# Patient Record
Sex: Male | Born: 1960 | Race: White | Hispanic: No | Marital: Married | Smoking: Never smoker
Health system: Southern US, Community
[De-identification: ages and names within clinical notes are randomized; demographics above are authoritative.]

## PROBLEM LIST (undated history)

## (undated) DIAGNOSIS — F1120 Opioid dependence, uncomplicated: Secondary | ICD-10-CM

## (undated) DIAGNOSIS — E119 Type 2 diabetes mellitus without complications: Secondary | ICD-10-CM

## (undated) DIAGNOSIS — F331 Major depressive disorder, recurrent, moderate: Secondary | ICD-10-CM

## (undated) DIAGNOSIS — J189 Pneumonia, unspecified organism: Secondary | ICD-10-CM

## (undated) DIAGNOSIS — I10 Essential (primary) hypertension: Secondary | ICD-10-CM

## (undated) DIAGNOSIS — M51369 Other intervertebral disc degeneration, lumbar region without mention of lumbar back pain or lower extremity pain: Secondary | ICD-10-CM

## (undated) DIAGNOSIS — F419 Anxiety disorder, unspecified: Secondary | ICD-10-CM

## (undated) DIAGNOSIS — E782 Mixed hyperlipidemia: Secondary | ICD-10-CM

## (undated) DIAGNOSIS — L409 Psoriasis, unspecified: Secondary | ICD-10-CM

## (undated) DIAGNOSIS — N401 Enlarged prostate with lower urinary tract symptoms: Secondary | ICD-10-CM

## (undated) DIAGNOSIS — J4520 Mild intermittent asthma, uncomplicated: Secondary | ICD-10-CM

## (undated) DIAGNOSIS — I25118 Atherosclerotic heart disease of native coronary artery with other forms of angina pectoris: Secondary | ICD-10-CM

## (undated) DIAGNOSIS — G629 Polyneuropathy, unspecified: Secondary | ICD-10-CM

## (undated) DIAGNOSIS — K219 Gastro-esophageal reflux disease without esophagitis: Secondary | ICD-10-CM

## (undated) DIAGNOSIS — R0600 Dyspnea, unspecified: Secondary | ICD-10-CM

## (undated) DIAGNOSIS — F329 Major depressive disorder, single episode, unspecified: Secondary | ICD-10-CM

## (undated) DIAGNOSIS — Z8489 Family history of other specified conditions: Secondary | ICD-10-CM

## (undated) DIAGNOSIS — M5136 Other intervertebral disc degeneration, lumbar region: Secondary | ICD-10-CM

## (undated) DIAGNOSIS — E66812 Obesity, class 2: Secondary | ICD-10-CM

## (undated) DIAGNOSIS — E785 Hyperlipidemia, unspecified: Secondary | ICD-10-CM

## (undated) DIAGNOSIS — I219 Acute myocardial infarction, unspecified: Secondary | ICD-10-CM

## (undated) DIAGNOSIS — Z96651 Presence of right artificial knee joint: Secondary | ICD-10-CM

## (undated) DIAGNOSIS — D649 Anemia, unspecified: Secondary | ICD-10-CM

## (undated) DIAGNOSIS — F32A Depression, unspecified: Secondary | ICD-10-CM

## (undated) DIAGNOSIS — M1990 Unspecified osteoarthritis, unspecified site: Secondary | ICD-10-CM

## (undated) DIAGNOSIS — I2510 Atherosclerotic heart disease of native coronary artery without angina pectoris: Secondary | ICD-10-CM

## (undated) HISTORY — PX: SHOULDER ARTHROSCOPY WITH OPEN ROTATOR CUFF REPAIR: SHX6092

## (undated) HISTORY — DX: Morbid (severe) obesity due to excess calories: E66.01

## (undated) HISTORY — DX: Anxiety disorder, unspecified: F41.9

## (undated) HISTORY — PX: CORONARY ANGIOPLASTY: SHX604

## (undated) HISTORY — DX: Benign prostatic hyperplasia with lower urinary tract symptoms: N40.1

## (undated) HISTORY — DX: Mixed hyperlipidemia: E78.2

## (undated) HISTORY — PX: CHOLECYSTECTOMY: SHX55

## (undated) HISTORY — PX: CARDIAC CATHETERIZATION: SHX172

## (undated) HISTORY — DX: Obesity, class 2: E66.812

## (undated) HISTORY — DX: Major depressive disorder, recurrent, moderate: F33.1

## (undated) HISTORY — DX: Opioid dependence, uncomplicated: F11.20

## (undated) HISTORY — PX: ESOPHAGEAL DILATION: SHX303

## (undated) HISTORY — DX: Atherosclerotic heart disease of native coronary artery with other forms of angina pectoris: I25.118

## (undated) HISTORY — PX: TUMOR REMOVAL: SHX12

## (undated) HISTORY — DX: Presence of right artificial knee joint: Z96.651

## (undated) HISTORY — DX: Anemia, unspecified: D64.9

## (undated) HISTORY — PX: KNEE ARTHROSCOPY: SUR90

## (undated) HISTORY — DX: Other intervertebral disc degeneration, lumbar region without mention of lumbar back pain or lower extremity pain: M51.369

## (undated) HISTORY — DX: Other intervertebral disc degeneration, lumbar region: M51.36

## (undated) HISTORY — DX: Mild intermittent asthma, uncomplicated: J45.20

---

## 2008-07-08 ENCOUNTER — Ambulatory Visit (HOSPITAL_BASED_OUTPATIENT_CLINIC_OR_DEPARTMENT_OTHER): Admission: RE | Admit: 2008-07-08 | Discharge: 2008-07-08 | Payer: Self-pay | Admitting: Orthopedic Surgery

## 2008-07-08 ENCOUNTER — Encounter (INDEPENDENT_AMBULATORY_CARE_PROVIDER_SITE_OTHER): Payer: Self-pay | Admitting: Orthopedic Surgery

## 2009-01-05 ENCOUNTER — Encounter: Payer: Self-pay | Admitting: Cardiovascular Disease

## 2009-01-23 ENCOUNTER — Encounter: Payer: Self-pay | Admitting: Cardiovascular Disease

## 2009-01-26 ENCOUNTER — Ambulatory Visit: Payer: Self-pay | Admitting: Cardiovascular Disease

## 2009-01-26 DIAGNOSIS — F341 Dysthymic disorder: Secondary | ICD-10-CM

## 2009-01-26 DIAGNOSIS — I739 Peripheral vascular disease, unspecified: Secondary | ICD-10-CM

## 2009-01-26 DIAGNOSIS — R0602 Shortness of breath: Secondary | ICD-10-CM

## 2009-01-26 DIAGNOSIS — R079 Chest pain, unspecified: Secondary | ICD-10-CM

## 2009-01-26 DIAGNOSIS — I70219 Atherosclerosis of native arteries of extremities with intermittent claudication, unspecified extremity: Secondary | ICD-10-CM

## 2009-01-26 DIAGNOSIS — E860 Dehydration: Secondary | ICD-10-CM

## 2009-01-26 HISTORY — DX: Dysthymic disorder: F34.1

## 2009-01-26 HISTORY — DX: Peripheral vascular disease, unspecified: I73.9

## 2009-01-26 HISTORY — DX: Dehydration: E86.0

## 2009-01-26 HISTORY — DX: Atherosclerosis of native arteries of extremities with intermittent claudication, unspecified extremity: I70.219

## 2009-01-26 HISTORY — DX: Chest pain, unspecified: R07.9

## 2009-01-26 HISTORY — DX: Shortness of breath: R06.02

## 2009-02-17 ENCOUNTER — Ambulatory Visit: Payer: Self-pay

## 2009-02-17 ENCOUNTER — Encounter: Payer: Self-pay | Admitting: Cardiovascular Disease

## 2010-12-11 NOTE — Op Note (Signed)
NAMEGUNNER, Kyle [name]                 ACCOUNT NO.:  [id number]   MEDICAL RECORD NO.:  [id number]          PATIENT TYPE:  AMB   LOCATION:  DSC                          FACILITY:  [facility]   PHYSICIAN:  Marlowe Kays, M.D.  DATE OF BIRTH:  08-11-1960   DATE OF PROCEDURE:  07/08/2008  DATE OF DISCHARGE:                               OPERATIVE REPORT   PREOPERATIVE DIAGNOSES:  1. Mass right shoulder suspected lipoma.  2. Impingement syndrome with rotator cuff tendinopathy and AC      (acromioclavicular) joint arthritis left shoulder.   POSTOPERATIVE DIAGNOSES:  1. Mass right shoulder suspected lipoma.  2. Impingement syndrome with rotator cuff tendinopathy and AC joint      arthritis left shoulder.   OPERATIONS:  1. Excision of mass right shoulder (suspected lipoma).  2. Left shoulder arthroscopy with a) debridement of articular surface      of rotator cuff and labrum, b) arthroscopic subacromial      decompression, c) arthroscopic decompression of the distal inferior      clavicle.   SURGEON:  Marlowe Kays, M.D.   ASSISTANTDruscilla Brownie. Cherlynn June.   ANESTHESIA:  General.   INDICATIONS FOR PROCEDURE:  He has had a nongrowing mass which however  has concerned him over a long period of time in the deltoid area of his  right shoulder.  I told him I thought this was most benign and a lipoma  but he desired to have this excised at the time we did his left shoulder  surgery.  He has had a painful left shoulder with the MRI demonstrating  the preoperative diagnoses.   PROCEDURE IN DETAIL:  Satisfactory general anesthesia, first in a very  limited beach-chair position, the right shoulder girdle was prepped with  DuraPrep, draped in a sterile field.  Time-out performed.  I made a 5 cm  vertical incision and found the lipoma right below the subcutaneous  tissue.  I shelled this out and it will be sent to pathology.  Bleeders  were coagulated.  The wound was then closed with  interrupted 2-0 Vicryl  deep and interrupted 4-0 nylon mattress sutures in the skin.  Betadine,  Adaptic dry sterile dressing were applied.  We then prepped and draped  the left shoulder after placing him in the beach-chair position, draped  the shoulder in a sterile field.  The anatomy of the shoulder joint was  marked out, time-out performed.  He had had a preoperative interscalene  block but I still used half percent Marcaine with adrenaline for  hemostatic purposes infiltrating the subacromial space and lateral and  posterior portals initially.  Through a posterior soft spot portal I  atraumatically entered the glenohumeral joint.  He had a normal biceps  tendon.  There was some wear of the articular surface of the rotator  cuff and some moderate degeneration of the labrum at the biceps anchor.  The remainder of the shoulder joint looked unremarkable.  I advanced the  scope between the biceps tendon and subscapularis and using a switching  stick made an anterior incision over  the switching stick.  I then placed  a metal cannula in the joint followed by a 4.2 shaver and began debrided  down the labrum and the undersurface of the rotator cuff with pictures  being taken.  I then removed all fluid possible from the glenohumeral  joint and redirected the scope into the subacromial space through a  lateral portal, introduced a blunt cannula followed by a 4.2 shaver.  He  had a mild amount of bursal type tissue.  After removing this I also  documented the severe impingement he had.  I then brought in the 90  degree ArthroCare vaporizer and began removing soft tissue from the  undersurface of the acromion and the distal clavicle.  I followed this  with a 4 mm oval bur and alternated back and forth between the bur and  the vaporizer until we had a wide decompression of both the subacromial  space and the distal clavicle.  This was all documented with pictures  with the vaporizer in place and  his arm to his side and arm elevated.  I  then cleared the subacromial space of all fluid possible and I injected  the three portal sites as well as the subacromial space with half  percent Marcaine with adrenaline.  I then closed the three portals with  interrupted 4-0 nylon mattress sutures.  Betadine, Adaptic, dry sterile  dressing and shoulder immobilizer applied.  He tolerated the procedure  well and was taken to recovery in satisfactory condition with no known  complications.  He basically had no blood loss.           ______________________________  Marlowe Kays, M.D.     JA/MEDQ  D:  07/08/2008  T:  07/09/2008  Job:  [id number]

## 2011-05-03 LAB — BASIC METABOLIC PANEL
CO2: 28 mEq/L (ref 19–32)
Calcium: 9.3 mg/dL (ref 8.4–10.5)
Glucose, Bld: 113 mg/dL — ABNORMAL HIGH (ref 70–99)
Sodium: 139 mEq/L (ref 135–145)

## 2013-11-01 ENCOUNTER — Ambulatory Visit (INDEPENDENT_AMBULATORY_CARE_PROVIDER_SITE_OTHER): Payer: Managed Care, Other (non HMO) | Admitting: Podiatry

## 2013-11-01 ENCOUNTER — Encounter: Payer: Self-pay | Admitting: Podiatry

## 2013-11-01 VITALS — BP 120/79 | HR 103 | Ht 68.0 in | Wt 264.0 lb

## 2013-11-01 DIAGNOSIS — E119 Type 2 diabetes mellitus without complications: Secondary | ICD-10-CM

## 2013-11-01 DIAGNOSIS — M21969 Unspecified acquired deformity of unspecified lower leg: Secondary | ICD-10-CM

## 2013-11-01 DIAGNOSIS — M722 Plantar fascial fibromatosis: Secondary | ICD-10-CM

## 2013-11-01 DIAGNOSIS — Z794 Long term (current) use of insulin: Secondary | ICD-10-CM

## 2013-11-01 HISTORY — DX: Type 2 diabetes mellitus without complications: Z79.4

## 2013-11-01 HISTORY — DX: Plantar fascial fibromatosis: M72.2

## 2013-11-01 HISTORY — DX: Type 2 diabetes mellitus without complications: E11.9

## 2013-11-01 HISTORY — DX: Unspecified acquired deformity of unspecified lower leg: M21.969

## 2013-11-01 MED ORDER — OXYCODONE-ACETAMINOPHEN 7.5-325 MG PO TABS: 1.0000 | ORAL_TABLET | Freq: Four times a day (QID) | ORAL | Status: DC | PRN

## 2013-11-01 MED ORDER — NABUMETONE 500 MG PO TABS: 500.0000 mg | ORAL_TABLET | Freq: Two times a day (BID) | ORAL | Status: DC

## 2013-11-01 NOTE — Patient Instructions (Signed)
Seen for left heel pain. May take Relafen twice a day till pain stops. Ok to take pain pill as needed. Will prepare Orthotics on next visit.

## 2013-11-01 NOTE — Progress Notes (Signed)
Subjective: 53 year old male presents complaining of pain in left heel and arch x 3 weeks. On feet not much. Has not been able to work for several years due to bad back and bad knee since 2009.  Blood sugar stays normal, averaging 90-120.    Review of Systems - General ROS: negative for - chills, fever, night sweats, sleep disturbance, weight gain or weight loss Ophthalmic ROS: negative ENT ROS: negative Allergy and Immunology ROS: Seasonal. Respiratory ROS: no cough, shortness of breath, or wheezing Cardiovascular ROS: no chest pain or dyspnea on exertion Gastrointestinal ROS: no abdominal pain, change in bowel habits, or black or bloody stools Genito-Urinary ROS: no dysuria, trouble voiding, or hematuria Musculoskeletal ROS: Back and knee pain x 10 years. Neurological ROS: no TIA or stroke symptoms Dermatological ROS: Psoriasis on knees and elbows.  Objective: Dermatologic: Dry and cracking heel. Vascular: All pedal pulses are palpable.  Neurologic: All epicritic and tactile sensations grossly intact.  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 Pain with direct pressure to left heel and arch.  Radiographic examination reveal high arch cavus foot with elevated and long first metatarsal bone, increased lateral deviation of the Calcaneocuboid angle bilateral. Positive of plantar calcaneal spur bilateral.   Assessment: Plantar fasciitis left. Hypermobile first ray left. NIDDM under control.  Plan: Reviewed clinical findings and available treatment options such as injection, NSAIA, orthotics and change in shoe gear and activity.  Recommended Orthotic shoe inserts. Patient will return for orthotics.

## 2013-11-17 ENCOUNTER — Encounter: Payer: Self-pay | Admitting: Podiatry

## 2013-11-17 ENCOUNTER — Ambulatory Visit (INDEPENDENT_AMBULATORY_CARE_PROVIDER_SITE_OTHER): Payer: Managed Care, Other (non HMO) | Admitting: Podiatry

## 2013-11-17 VITALS — BP 147/98 | HR 93

## 2013-11-17 DIAGNOSIS — M722 Plantar fascial fibromatosis: Secondary | ICD-10-CM

## 2013-11-17 DIAGNOSIS — M21969 Unspecified acquired deformity of unspecified lower leg: Secondary | ICD-10-CM

## 2013-11-17 MED ORDER — OXYCODONE-ACETAMINOPHEN 7.5-325 MG PO TABS: 1.0000 | ORAL_TABLET | Freq: Four times a day (QID) | ORAL | Status: DC | PRN

## 2013-11-17 NOTE — Progress Notes (Signed)
Subjective: Has bought OTC orthotics and unable to tolerate in shoe.  Patient adamantly Stated that he cannot take Cortisone shot because it would elevate his blood sugar.  Patient is taking Relafen and pain pills. The medication is not helping with his foot pain.  Custom orthotics are not affordable for him at this time.  Patient wish to try surgical rout.   Objective: Previous X-ray indicate spurring at plantar calcaneal tuberosity, elevated first ray in Cavus type foot.  Ankle range of motion is within normal. Positive of elevated first ray upon loading of the forefoot. Neurovascular status are within normal.  Assessment: Plantar fasciitis left foot with calcaneal spur.  Plan: Reviewed all available treatment options such as injection, NSAIA, pain medications, Night Splints, Custom orthotics, and exercise etc.  Patient requested for surgical intervention. Surgery consent form reviewed for Plantar fascial release with spur resection.

## 2013-11-17 NOTE — Patient Instructions (Signed)
Seen for left heel pain. All treatment options reviewed.  May also try Night Splint, NSAIA and pain medication.  Surgery consent form reviewed for heel spur resection with plantar fascial release left foot.

## 2013-11-25 DIAGNOSIS — M7730 Calcaneal spur, unspecified foot: Secondary | ICD-10-CM

## 2013-11-25 HISTORY — PX: OTHER SURGICAL HISTORY: SHX169

## 2013-11-26 ENCOUNTER — Telehealth: Payer: Self-pay | Admitting: *Deleted

## 2013-11-26 NOTE — Telephone Encounter (Signed)
DR.Sheard, Patient called this am and asked can he put any weight on his left foot. He had surgery yesterday.

## 2013-11-30 ENCOUNTER — Ambulatory Visit (INDEPENDENT_AMBULATORY_CARE_PROVIDER_SITE_OTHER): Payer: Managed Care, Other (non HMO) | Admitting: Podiatry

## 2013-11-30 ENCOUNTER — Encounter: Payer: Managed Care, Other (non HMO) | Admitting: Podiatry

## 2013-11-30 ENCOUNTER — Encounter: Payer: Self-pay | Admitting: Podiatry

## 2013-11-30 VITALS — BP 131/81 | HR 85

## 2013-11-30 DIAGNOSIS — M722 Plantar fascial fibromatosis: Secondary | ICD-10-CM

## 2013-11-30 NOTE — Patient Instructions (Signed)
Seen for post op follow up care. Doing well walking in splint. Will stay in splint another week.

## 2013-11-30 NOTE — Progress Notes (Signed)
5 day post op following heel spur resection with plantar fasciotomy left foot. Patient is walking with posterior splint in left foot. Patient stated that the foot feels better than before the surgery.  Assessment: Satisfactory progress.  Plan: Keep in posterior splint for another week. Next week change to Cam walker.

## 2013-12-07 ENCOUNTER — Ambulatory Visit (INDEPENDENT_AMBULATORY_CARE_PROVIDER_SITE_OTHER): Payer: Managed Care, Other (non HMO) | Admitting: Podiatry

## 2013-12-07 ENCOUNTER — Encounter: Payer: Self-pay | Admitting: Podiatry

## 2013-12-07 VITALS — BP 140/82 | HR 102

## 2013-12-07 DIAGNOSIS — M79606 Pain in leg, unspecified: Secondary | ICD-10-CM

## 2013-12-07 DIAGNOSIS — M79609 Pain in unspecified limb: Secondary | ICD-10-CM

## 2013-12-07 DIAGNOSIS — M722 Plantar fascial fibromatosis: Secondary | ICD-10-CM

## 2013-12-07 HISTORY — DX: Pain in leg, unspecified: M79.606

## 2013-12-07 NOTE — Progress Notes (Signed)
12 days following heel spur resection and plantar fasciotomy. Doing well with posterior splint on left. Splint removed.  Suture removed. No edema or abnormal discoloration noted. Right lower limb placed in CAM walker.  Return in 1 month.

## 2013-12-07 NOTE — Patient Instructions (Signed)
12 day post op check left foot. Doing well. Dispensed CAM walker. Return in 2 weeks.

## 2013-12-21 ENCOUNTER — Encounter: Payer: Self-pay | Admitting: Podiatry

## 2013-12-21 ENCOUNTER — Ambulatory Visit (INDEPENDENT_AMBULATORY_CARE_PROVIDER_SITE_OTHER): Payer: Managed Care, Other (non HMO) | Admitting: Podiatry

## 2013-12-21 VITALS — BP 123/76 | HR 81

## 2013-12-21 DIAGNOSIS — M722 Plantar fascial fibromatosis: Secondary | ICD-10-CM

## 2013-12-21 NOTE — Progress Notes (Signed)
Three and a half week since Heel spur resection.  Stated that the foot feels a lot better than the first he came in.  Surgical wound well healed.  Ready to return to regular tennis shoes. Will prepare for Orthotics on next visit.  Return as needed.

## 2013-12-21 NOTE — Patient Instructions (Signed)
Three and a half week following Heel spur resection.  Doing well with ambulation in CAM walker.  OK to return to regular shoes. Return to prepare for Orthotics.

## 2015-05-29 ENCOUNTER — Other Ambulatory Visit: Payer: Self-pay | Admitting: Physician Assistant

## 2015-05-29 DIAGNOSIS — M545 Low back pain: Secondary | ICD-10-CM

## 2015-06-04 ENCOUNTER — Inpatient Hospital Stay
Admission: RE | Admit: 2015-06-04 | Discharge: 2015-06-04 | Disposition: A | Discharge status: Home / Self Care | Payer: Self-pay | Source: Ambulatory Visit | Attending: Physician Assistant | Admitting: Physician Assistant

## 2015-06-10 ENCOUNTER — Ambulatory Visit
Admission: RE | Admit: 2015-06-10 | Discharge: 2015-06-10 | Disposition: A | Discharge status: Home / Self Care | Payer: Managed Care, Other (non HMO) | Source: Ambulatory Visit | Attending: Physician Assistant | Admitting: Physician Assistant

## 2015-06-10 DIAGNOSIS — M545 Low back pain: Secondary | ICD-10-CM

## 2015-08-23 DIAGNOSIS — M5137 Other intervertebral disc degeneration, lumbosacral region: Secondary | ICD-10-CM | POA: Insufficient documentation

## 2015-08-23 DIAGNOSIS — I10 Essential (primary) hypertension: Secondary | ICD-10-CM

## 2015-08-23 DIAGNOSIS — M51379 Other intervertebral disc degeneration, lumbosacral region without mention of lumbar back pain or lower extremity pain: Secondary | ICD-10-CM

## 2015-08-23 DIAGNOSIS — R1084 Generalized abdominal pain: Secondary | ICD-10-CM

## 2015-08-23 DIAGNOSIS — E119 Type 2 diabetes mellitus without complications: Secondary | ICD-10-CM

## 2015-08-23 HISTORY — DX: Type 2 diabetes mellitus without complications: E11.9

## 2015-08-23 HISTORY — DX: Morbid (severe) obesity due to excess calories: E66.01

## 2015-08-23 HISTORY — DX: Other intervertebral disc degeneration, lumbosacral region without mention of lumbar back pain or lower extremity pain: M51.379

## 2015-08-23 HISTORY — DX: Generalized abdominal pain: R10.84

## 2015-08-23 HISTORY — DX: Essential (primary) hypertension: I10

## 2015-08-23 HISTORY — DX: Other intervertebral disc degeneration, lumbosacral region: M51.37

## 2015-09-08 ENCOUNTER — Other Ambulatory Visit: Payer: Self-pay | Admitting: Orthopedic Surgery

## 2015-09-21 NOTE — Pre-Procedure Instructions (Signed)
Kyle [name]  09/21/2015     Your procedure is scheduled on : Wednesday October 04, 2015 at 12:45 PM.  Report to [facility] Admitting at 10:45 AM.  Call this number if you have problems the morning of surgery: [phone]    Remember:  Do not eat food or drink liquids after midnight.  Take these medicines the morning of surgery with A SIP OF WATER : Amlodipine (Norvasc), Hydrocodone if needed, Metoprolol (Toprol XL), Omeprazole (Prilosec), Paroxetine (Paxil)   Stop taking any vitamins, herbal medications/supplements, Ibuprofen, Advil, Motrin, Aleve, etc on Wednesday March 1st   Only take 11 units of Lantus insulin the night before your surgery   Do NOT take any diabetic pills the morning of your surgery (NO Glipizide/Glucotrol, NO Metformin/Glucophage)  How to Manage Your Diabetes Before Surgery   Why is it important to control my blood sugar before and after surgery?   Improving blood sugar levels before and after surgery helps healing and can limit problems.  A way of improving blood sugar control is eating a healthy diet by:  - Eating less sugar and carbohydrates  - Increasing activity/exercise  - Talk with your doctor about reaching your blood sugar goals  High blood sugars (greater than 180 mg/dL) can raise your risk of infections and slow down your recovery so you will need to focus on controlling your diabetes during the weeks before surgery.  Make sure that the doctor who takes care of your diabetes knows about your planned surgery including the date and location.  How do I manage my blood sugars before surgery?   Check your blood sugar at least 4 times a day, 2 days before surgery to make sure that they are not too high or low.   Check your blood sugar the morning of your surgery when you wake up and every 2 hours until you get to the Short-Stay unit.  If your blood sugar is less than 70 mg/dL, you will need to treat for low blood sugar by:  Treat a low  blood sugar (less than 70 mg/dL) with 1/2 cup of clear juice (cranberry or apple), 4 glucose tablets, OR glucose gel.  Recheck blood sugar in 15 minutes after treatment (to make sure it is greater than 70 mg/dL).  If blood sugar is not greater than 70 mg/dL on re-check, call [phone] for further instructions.   Report your blood sugar to the Short-Stay nurse when you get to Short-Stay.  References:  [facility], 2007 "How to Manage your Diabetes Before and After Surgery".  What do I do about my diabetes medications?   Do not take oral diabetes medicines (pills) the morning of surgery.   Do not take other diabetes injectables the day of surgery including Byetta, Victoza, Bydureon, and Trulicity.   Do not wear jewelry.  Do not wear lotions, powders, or cologne.    Men may shave face and neck.  Do not bring valuables to the hospital.  [facility] is not responsible for any belongings or valuables.  Contacts, dentures or bridgework may not be worn into surgery.  Leave your suitcase in the car.  After surgery it may be brought to your room.  For patients admitted to the hospital, discharge time will be determined by your treatment team.  Patients discharged the day of surgery will not be allowed to drive home.   Name and phone number of your driver:    Special instructions:  Shower using CHG  soap the night before and the morning of your surgery  Please read over the following fact sheets that you were given. Pain Booklet, Coughing and Deep Breathing, Blood Transfusion Information, Total Joint Packet, MRSA Information and Surgical Site Infection Prevention

## 2015-09-22 ENCOUNTER — Encounter (HOSPITAL_COMMUNITY): Payer: Self-pay

## 2015-09-22 ENCOUNTER — Encounter (HOSPITAL_COMMUNITY)
Admission: RE | Admit: 2015-09-22 | Discharge: 2015-09-22 | Disposition: A | Discharge status: Home / Self Care | Payer: Managed Care, Other (non HMO) | Source: Ambulatory Visit | Attending: Orthopedic Surgery | Admitting: Orthopedic Surgery

## 2015-09-22 ENCOUNTER — Ambulatory Visit (HOSPITAL_COMMUNITY)
Admission: RE | Admit: 2015-09-22 | Discharge: 2015-09-22 | Disposition: A | Discharge status: Home / Self Care | Payer: Managed Care, Other (non HMO) | Source: Ambulatory Visit | Attending: Orthopedic Surgery | Admitting: Orthopedic Surgery

## 2015-09-22 DIAGNOSIS — Z794 Long term (current) use of insulin: Secondary | ICD-10-CM | POA: Diagnosis not present

## 2015-09-22 DIAGNOSIS — Z01818 Encounter for other preprocedural examination: Secondary | ICD-10-CM | POA: Insufficient documentation

## 2015-09-22 DIAGNOSIS — E785 Hyperlipidemia, unspecified: Secondary | ICD-10-CM | POA: Diagnosis not present

## 2015-09-22 DIAGNOSIS — L409 Psoriasis, unspecified: Secondary | ICD-10-CM | POA: Insufficient documentation

## 2015-09-22 DIAGNOSIS — Z0183 Encounter for blood typing: Secondary | ICD-10-CM | POA: Insufficient documentation

## 2015-09-22 DIAGNOSIS — K219 Gastro-esophageal reflux disease without esophagitis: Secondary | ICD-10-CM | POA: Insufficient documentation

## 2015-09-22 DIAGNOSIS — I2510 Atherosclerotic heart disease of native coronary artery without angina pectoris: Secondary | ICD-10-CM | POA: Diagnosis not present

## 2015-09-22 DIAGNOSIS — F329 Major depressive disorder, single episode, unspecified: Secondary | ICD-10-CM | POA: Insufficient documentation

## 2015-09-22 DIAGNOSIS — I10 Essential (primary) hypertension: Secondary | ICD-10-CM | POA: Diagnosis not present

## 2015-09-22 DIAGNOSIS — Z7982 Long term (current) use of aspirin: Secondary | ICD-10-CM | POA: Diagnosis not present

## 2015-09-22 DIAGNOSIS — Z79899 Other long term (current) drug therapy: Secondary | ICD-10-CM | POA: Insufficient documentation

## 2015-09-22 DIAGNOSIS — Z01812 Encounter for preprocedural laboratory examination: Secondary | ICD-10-CM | POA: Insufficient documentation

## 2015-09-22 DIAGNOSIS — M1711 Unilateral primary osteoarthritis, right knee: Secondary | ICD-10-CM | POA: Insufficient documentation

## 2015-09-22 DIAGNOSIS — E119 Type 2 diabetes mellitus without complications: Secondary | ICD-10-CM | POA: Diagnosis not present

## 2015-09-22 HISTORY — DX: Essential (primary) hypertension: I10

## 2015-09-22 HISTORY — DX: Atherosclerotic heart disease of native coronary artery without angina pectoris: I25.10

## 2015-09-22 HISTORY — DX: Hyperlipidemia, unspecified: E78.5

## 2015-09-22 HISTORY — DX: Major depressive disorder, single episode, unspecified: F32.9

## 2015-09-22 HISTORY — DX: Type 2 diabetes mellitus without complications: E11.9

## 2015-09-22 HISTORY — DX: Depression, unspecified: F32.A

## 2015-09-22 HISTORY — DX: Unspecified osteoarthritis, unspecified site: M19.90

## 2015-09-22 HISTORY — DX: Family history of other specified conditions: Z84.89

## 2015-09-22 HISTORY — DX: Psoriasis, unspecified: L40.9

## 2015-09-22 HISTORY — DX: Gastro-esophageal reflux disease without esophagitis: K21.9

## 2015-09-22 LAB — BASIC METABOLIC PANEL
Anion gap: 12 (ref 5–15)
BUN: 10 mg/dL (ref 6–20)
CHLORIDE: 103 mmol/L (ref 101–111)
CO2: 25 mmol/L (ref 22–32)
CREATININE: 0.96 mg/dL (ref 0.61–1.24)
Calcium: 9.5 mg/dL (ref 8.9–10.3)
GFR calc Af Amer: >60 mL/min (ref >60)
GFR calc non Af Amer: >60 mL/min (ref >60)
Glucose, Bld: 147 mg/dL — ABNORMAL HIGH (ref 65–99)
Potassium: 4.6 mmol/L (ref 3.5–5.1)
SODIUM: 140 mmol/L (ref 135–145)

## 2015-09-22 LAB — CBC WITH DIFFERENTIAL/PLATELET
Basophils Absolute: 0 10*3/uL (ref 0.0–0.1)
Basophils Relative: 1 %
EOS ABS: 0.4 10*3/uL (ref 0.0–0.7)
EOS PCT: 6 %
HCT: 40.8 % (ref 39.0–52.0)
Hemoglobin: 13.4 g/dL (ref 13.0–17.0)
LYMPHS ABS: 1.8 10*3/uL (ref 0.7–4.0)
Lymphocytes Relative: 27 %
MCH: 30.2 pg (ref 26.0–34.0)
MCHC: 32.8 g/dL (ref 30.0–36.0)
MCV: 91.9 fL (ref 78.0–100.0)
MONOS PCT: 12 %
Monocytes Absolute: 0.8 10*3/uL (ref 0.1–1.0)
Neutro Abs: 3.6 10*3/uL (ref 1.7–7.7)
Neutrophils Relative %: 54 %
PLATELETS: 264 10*3/uL (ref 150–400)
RBC: 4.44 MIL/uL (ref 4.22–5.81)
RDW: 13.4 % (ref 11.5–15.5)
WBC: 6.7 10*3/uL (ref 4.0–10.5)

## 2015-09-22 LAB — URINALYSIS, ROUTINE W REFLEX MICROSCOPIC
Bilirubin Urine: NEGATIVE
Glucose, UA: 100 mg/dL — AB
Hgb urine dipstick: NEGATIVE
KETONES UR: NEGATIVE mg/dL
LEUKOCYTES UA: NEGATIVE
NITRITE: NEGATIVE
PH: 5 (ref 5.0–8.0)
Protein, ur: NEGATIVE mg/dL
SPECIFIC GRAVITY, URINE: 1.015 (ref 1.005–1.030)

## 2015-09-22 LAB — SURGICAL PCR SCREEN
MRSA, PCR: POSITIVE — AB
STAPHYLOCOCCUS AUREUS: POSITIVE — AB

## 2015-09-22 LAB — PROTIME-INR
INR: 1.1 (ref 0.00–1.49)
PROTHROMBIN TIME: 14.4 s (ref 11.6–15.2)

## 2015-09-22 LAB — APTT: aPTT: 32 seconds (ref 24–37)

## 2015-09-22 LAB — TYPE AND SCREEN
ABO/RH(D): O POS
Antibody Screen: NEGATIVE

## 2015-09-22 LAB — ABO/RH: ABO/RH(D): O POS

## 2015-09-22 LAB — GLUCOSE, CAPILLARY: GLUCOSE-CAPILLARY: 159 mg/dL — AB (ref 65–99)

## 2015-09-22 NOTE — Progress Notes (Signed)
   09/22/15 1058  OBSTRUCTIVE SLEEP APNEA  Have you ever been diagnosed with sleep apnea through a sleep study? No  Do you snore loudly (loud enough to be heard through closed doors)?  1  Do you often feel tired, fatigued, or sleepy during the daytime (such as falling asleep during driving or talking to someone)? 0  Has anyone observed you stop breathing during your sleep? 0  Do you have, or are you being treated for high blood pressure? 1  BMI more than 35 kg/m2? 1  Age > 50 (1-yes) 1  Neck circumference greater than:Male 16 inches or larger, Male 17inches or larger? 1  Male Gender (Yes=1) 1  Obstructive Sleep Apnea Score 6  Score 5 or greater  Results sent to PCP   This patient has screened at risk for sleep apnea using the STOP Bang tool used during a pre-surgical visit. A score of 4 or greater is at risk for sleep apnea.

## 2015-09-22 NOTE — Progress Notes (Signed)
Nurse called prescription Mupirocin into [facility] and then called and informed patient of positive PCR results. Patient verbalized understanding.

## 2015-09-22 NOTE — Progress Notes (Signed)
Patient arrived to 1000 PAT appointment at 1050  PCP is Treasa School, PA-C  Patient informed Nurse that he had a stress test and cardiac cath within the last 5 years at [facility]. Will request records. Patient stated "I had a cath, but I did not have any stents.Marland KitchenMarland KitchenMarland KitchenI had three blockages, but they opened it up with a balloon." Nurse inquired about Cardiologist and patient stated "I can't think of his name, but he's in [place]." LOV was approximately 2 or 3 years ago.  Nurse inquired about CBG and patient stated the highest his blood sugar has been was 200 and the lowest was 74 (but patient stated he had a cold and was not eating when his blood sugar was 74). However typically the lowest blood glucose has been was 117.  Patient informed Nurse that he takes 14 units of Lantus at bedtime. Lantus was not on med list. Nurse added medication and informed patient to only take 11 units of Lantus the night before surgery. Patient verbalized understanding.

## 2015-09-23 LAB — HEMOGLOBIN A1C
Hgb A1c MFr Bld: 8.5 % — ABNORMAL HIGH (ref 4.8–5.6)
Mean Plasma Glucose: 197 mg/dL

## 2015-09-25 ENCOUNTER — Encounter (HOSPITAL_COMMUNITY): Payer: Self-pay

## 2015-09-25 NOTE — Progress Notes (Signed)
Anesthesia Chart Review: Patient is a 55 year old male scheduled for right TKA on 10/04/15 by Dr. Turner Daniels.   History includes non-smoker, CAD (minimal by 2012 cath), HTN, DM2, depression, GERD, HLD, psoriasis, cholecystectomy. BMI is consistent with obesity. PCP is listed as Treasa School, PA-C.   Meds include amlodipine, ASA, Bybureon, Lasix, glipizide, Norco, Lantus, lisinopril, metformin, Toprol, Nitro, Prilosec, Paxil, trazodone.   09/22/15 EKG: NSR.  06/10/14 Nuclear stress test [facility]; radiology portion printed from PACS): Well tolerated Lexiscan perfusion stress test, with no clinical ischemia during vasodilator stress. Perfusion scan report pending per radiology=>Impression:  1. No reversible ischemia or infarction. 2. Normal LV wall motion. 3. LVEF 58%. 4. Low risk stress test findings.   03/28/11 LHC (Dr. Cathlean Cower, [facility]): Conclusions: Trivial non-obstructive CAD (normal LM, LAD; 10% mid CX and mid RCA). Normal LVF. LVEF 65%. Recommend work-up for non-cardiac causes of symptoms. CAD risk factor reduction.    02/17/09 Echo: Study Conclusions 1. Left ventricle: The cavity size was normal. Wall thickness was  normal. Systolic function was normal. The estimated ejection  fraction was in the range of 60% to 65%. Doppler parameters are  consistent with abnormal left ventricular relaxation (grade 1  diastolic dysfunction). 2. Left atrium: The atrium was mildly dilated.  09/22/15 CXR: IMPRESSION: No active cardiopulmonary disease. Degenerative changes thoracic spine.  Preoperative labs noted. A1c 8.5.   If no acute changes then I anticipate that he can proceed as planned from an anesthesia standpoint.  Velna Ochs [facility] Short Stay Center/Anesthesiology Phone [phone] 09/25/2015 3:40 PM

## 2015-09-30 DIAGNOSIS — M1711 Unilateral primary osteoarthritis, right knee: Secondary | ICD-10-CM

## 2015-09-30 HISTORY — DX: Unilateral primary osteoarthritis, right knee: M17.11

## 2015-10-03 MED ORDER — DEXTROSE-NACL 5-0.45 % IV SOLN: INTRAVENOUS | Status: DC

## 2015-10-03 MED ORDER — VANCOMYCIN HCL 10 G IV SOLR
1500.0000 mg | INTRAVENOUS | Status: AC
  Administered 2015-10-04: 1500 mg via INTRAVENOUS
  Filled 2015-10-03: qty 1500

## 2015-10-03 MED ORDER — SODIUM CHLORIDE 0.9 % IV SOLN
1000.0000 mg | INTRAVENOUS | Status: AC
  Administered 2015-10-04: 1000 mg via INTRAVENOUS
  Filled 2015-10-03: qty 10

## 2015-10-03 MED ORDER — CHLORHEXIDINE GLUCONATE 4 % EX LIQD: 60.0000 mL | Freq: Once | CUTANEOUS | Status: DC

## 2015-10-03 MED ORDER — BUPIVACAINE LIPOSOME 1.3 % IJ SUSP
20.0000 mL | INTRAMUSCULAR | Status: AC
  Administered 2015-10-04: 20 mL
  Filled 2015-10-03: qty 20

## 2015-10-03 NOTE — Anesthesia Preprocedure Evaluation (Addendum)
Anesthesia Evaluation  Patient identified by MRN, date of birth, ID band Patient awake    Reviewed: Allergy & Precautions, NPO status , Patient's Chart, lab work & pertinent test results  Airway Mallampati: III   Neck ROM: Full    Dental  (+) Teeth Intact, Dental Advisory Given   Pulmonary neg pulmonary ROS,    breath sounds clear to auscultation       Cardiovascular hypertension, Pt. on medications  Rhythm:Regular     Neuro/Psych Depression    GI/Hepatic Neg liver ROS, GERD  ,  Endo/Other  diabetes, Type 2  Renal/GU negative Renal ROS     Musculoskeletal   Abdominal (+) + obese,   Peds  Hematology 13/40   Anesthesia Other Findings   Reproductive/Obstetrics                          Anesthesia Physical Anesthesia Plan  ASA: III  Anesthesia Plan: General   Post-op Pain Management:    Induction:   Airway Management Planned: Oral ETT and Video Laryngoscope Planned  Additional Equipment:   Intra-op Plan:   Post-operative Plan:   Informed Consent: I have reviewed the patients History and Physical, chart, labs and discussed the procedure including the risks, benefits and alternatives for the proposed anesthesia with the patient or authorized representative who has indicated his/her understanding and acceptance.     Plan Discussed with:   Anesthesia Plan Comments: (Patient refuses spinal and also refused femoral nerve block.  Wants GA)       Anesthesia Quick Evaluation

## 2015-10-03 NOTE — H&P (Signed)
TOTAL KNEE ADMISSION H&P  Patient is being admitted for right total knee arthroplasty.  Subjective:  Chief Complaint:right knee pain.  HPI: Kyle [name], 55 y.o. male, has a history of pain and functional disability in the right knee due to arthritis and has failed non-surgical conservative treatments for greater than 12 weeks to includeNSAID's and/or analgesics, corticosteriod injections, viscosupplementation injections, flexibility and strengthening excercises, weight reduction as appropriate and activity modification.  Onset of symptoms was gradual, starting several years ago with gradually worsening course since that time. The patient noted no past surgery on the right knee(s).  Patient currently rates pain in the right knee(s) at 10 out of 10 with activity. Patient has night pain, worsening of pain with activity and weight bearing, pain that interferes with activities of daily living, pain with passive range of motion, crepitus and joint swelling.  Patient has evidence of periarticular osteophytes and joint space narrowing by imaging studies.  There is no active infection.  Patient Active Problem List   Diagnosis Date Noted  . Primary osteoarthritis of right knee 09/30/2015  . Morbid obesity (HCC) 08/23/2015  . Essential (primary) hypertension 08/23/2015  . Degeneration of intervertebral disc of lumbosacral region 08/23/2015  . Pain in lower limb 12/07/2013  . Plantar fasciitis of left foot 11/01/2013  . Metatarsal deformity 11/01/2013  . Diabetes (HCC) 11/01/2013  . DEHYDRATION 01/26/2009  . DYSTHYMIC DISORDER 01/26/2009  . ATHEROSLERO NATV ART EXTREM W/INTERMIT CLAUDICAT 01/26/2009  . CLAUDICATION 01/26/2009  . DYSPNEA 01/26/2009  . CHEST PAIN UNSPECIFIED 01/26/2009   Past Medical History  Diagnosis Date  . Hypertension   . Diabetes mellitus without complication (HCC)   . Family history of adverse reaction to anesthesia     "it made my son sick"  . Depression   . GERD  (gastroesophageal reflux disease)   . Arthritis   . Hyperlipemia   . Psoriasis   . Coronary artery disease     minimal CAD '12; NL stress test 06/10/14 ([facility])    Past Surgical History  Procedure Laterality Date  . Heel spur resection with fasiotomy Left 11/25/2013    @ [facility]  . Knee arthroscopy Bilateral   . Cholecystectomy    . Shoulder arthroscopy with open rotator cuff repair Right   . Cardiac catheterization      03/28/11 LHC: NL LM, LAD; 10% mCX, mRCA. EF 65%. (Dr. Dot Been, [facility])    No prescriptions prior to admission   Allergies  Allergen Reactions  . Cymbalta [Duloxetine Hcl] Other (See Comments)    "makes me crazy" depression  . Penicillins Hives and Swelling    Has patient had a PCN reaction causing immediate rash, facial/tongue/throat swelling, SOB or lightheadedness with hypotension: No Has patient had a PCN reaction causing severe rash involving mucus membranes or skin necrosis: No Has patient had a PCN reaction that required hospitalization No Has patient had a PCN reaction occurring within the last 10 years: No If all of the above answers are "NO", then may proceed with Cephalosporin use.     Social History  Substance Use Topics  . Smoking status: Never Smoker   . Smokeless tobacco: Never Used  . Alcohol Use: No    No family history on file.   Review of Systems  Constitutional: Negative.   HENT: Negative.   Eyes: Negative.   Respiratory: Negative.   Cardiovascular:       HTN  Gastrointestinal: Negative.   Genitourinary: Negative.   Musculoskeletal: Positive for joint pain.  Skin:  Negative.   Neurological: Negative.   Endo/Heme/Allergies:       Diabetes  Psychiatric/Behavioral: Negative.     Objective:  Physical Exam  Constitutional: He is oriented to person, place, and time. He appears well-developed and well-nourished.  HENT:  Head: Normocephalic and atraumatic.  Eyes: Pupils are equal, round, and reactive to light.  Neck: Normal range of  motion. Neck supple.  Cardiovascular: Intact distal pulses.   Respiratory: Effort normal.  Musculoskeletal: He exhibits tenderness.  the patient has good strength good range of motion bilaterally.  He has a range from 2-3 to 120.  His calves are soft and nontender.  He has tenderness over the medial joint line of the right knee.  Mild crepitus with range of motion.  No noticeable effusion.  No instability.  He is neurovascularly intact distally.  Neurological: He is alert and oriented to person, place, and time.  Skin: Skin is warm and dry.  Psychiatric: He has a normal mood and affect. His behavior is normal. Judgment and thought content normal.    Vital signs in last 24 hours:    Labs:   Estimated body mass index is 40.15 kg/(m^2) as calculated from the following:   Height as of 11/01/13: 5\' 8"  (1.727 m).   Weight as of 11/01/13: 119.75 kg (264 lb).   Imaging Review Plain radiographs demonstrate bilateral AP weightbearing, bilateral Rosenberg, lateral sunrise views of the right knee are taken and reviewed in office today.  Patient does have moderate to severe medial compartment arthritis on the right.  He does have a periarticular osteophyte formation and moderate patellofemoral arthritis.  Assessment/Plan:  End stage arthritis, right knee   The patient history, physical examination, clinical judgment of the provider and imaging studies are consistent with end stage degenerative joint disease of the right knee(s) and total knee arthroplasty is deemed medically necessary. The treatment options including medical management, injection therapy arthroscopy and arthroplasty were discussed at length. The risks and benefits of total knee arthroplasty were presented and reviewed. The risks due to aseptic loosening, infection, stiffness, patella tracking problems, thromboembolic complications and other imponderables were discussed. The patient acknowledged the explanation, agreed to proceed with  the plan and consent was signed. Patient is being admitted for inpatient treatment for surgery, pain control, PT, OT, prophylactic antibiotics, VTE prophylaxis, progressive ambulation and ADL's and discharge planning. The patient is planning to be discharged home with home health services

## 2015-10-04 ENCOUNTER — Encounter (HOSPITAL_COMMUNITY): Payer: Self-pay | Admitting: *Deleted

## 2015-10-04 ENCOUNTER — Encounter (HOSPITAL_COMMUNITY)
Admission: RE | Disposition: A | Discharge status: Home Health | Payer: Self-pay | Source: Ambulatory Visit | Attending: Orthopedic Surgery

## 2015-10-04 ENCOUNTER — Inpatient Hospital Stay (HOSPITAL_COMMUNITY): Payer: Managed Care, Other (non HMO) | Admitting: Anesthesiology

## 2015-10-04 ENCOUNTER — Inpatient Hospital Stay (HOSPITAL_COMMUNITY): Payer: Managed Care, Other (non HMO) | Admitting: Vascular Surgery

## 2015-10-04 ENCOUNTER — Inpatient Hospital Stay (HOSPITAL_COMMUNITY)
Admission: RE | Admit: 2015-10-04 | Discharge: 2015-10-06 | DRG: 470 | Disposition: A | Discharge status: Home Health | Payer: Managed Care, Other (non HMO) | Source: Ambulatory Visit | Attending: Orthopedic Surgery | Admitting: Orthopedic Surgery

## 2015-10-04 DIAGNOSIS — I10 Essential (primary) hypertension: Secondary | ICD-10-CM | POA: Diagnosis present

## 2015-10-04 DIAGNOSIS — F341 Dysthymic disorder: Secondary | ICD-10-CM | POA: Diagnosis present

## 2015-10-04 DIAGNOSIS — M25561 Pain in right knee: Secondary | ICD-10-CM | POA: Diagnosis present

## 2015-10-04 DIAGNOSIS — I2510 Atherosclerotic heart disease of native coronary artery without angina pectoris: Secondary | ICD-10-CM | POA: Diagnosis present

## 2015-10-04 DIAGNOSIS — F329 Major depressive disorder, single episode, unspecified: Secondary | ICD-10-CM | POA: Diagnosis present

## 2015-10-04 DIAGNOSIS — E785 Hyperlipidemia, unspecified: Secondary | ICD-10-CM | POA: Diagnosis present

## 2015-10-04 DIAGNOSIS — E119 Type 2 diabetes mellitus without complications: Secondary | ICD-10-CM | POA: Diagnosis present

## 2015-10-04 DIAGNOSIS — M1711 Unilateral primary osteoarthritis, right knee: Secondary | ICD-10-CM | POA: Diagnosis present

## 2015-10-04 DIAGNOSIS — I739 Peripheral vascular disease, unspecified: Secondary | ICD-10-CM | POA: Diagnosis present

## 2015-10-04 DIAGNOSIS — L409 Psoriasis, unspecified: Secondary | ICD-10-CM | POA: Diagnosis present

## 2015-10-04 DIAGNOSIS — D62 Acute posthemorrhagic anemia: Secondary | ICD-10-CM | POA: Diagnosis not present

## 2015-10-04 DIAGNOSIS — K219 Gastro-esophageal reflux disease without esophagitis: Secondary | ICD-10-CM | POA: Diagnosis present

## 2015-10-04 HISTORY — PX: TOTAL KNEE ARTHROPLASTY: SHX125

## 2015-10-04 LAB — GLUCOSE, CAPILLARY
GLUCOSE-CAPILLARY: 143 mg/dL — AB (ref 65–99)
GLUCOSE-CAPILLARY: 163 mg/dL — AB (ref 65–99)
Glucose-Capillary: 127 mg/dL — ABNORMAL HIGH (ref 65–99)

## 2015-10-04 SURGERY — ARTHROPLASTY, KNEE, TOTAL
Anesthesia: General | Site: Knee | Laterality: Right

## 2015-10-04 MED ORDER — BUPIVACAINE HCL (PF) 0.5 % IJ SOLN
INTRAMUSCULAR | Status: AC
  Filled 2015-10-04: qty 30

## 2015-10-04 MED ORDER — OXYCODONE HCL 5 MG PO TABS
5.0000 mg | ORAL_TABLET | ORAL | Status: DC | PRN
  Administered 2015-10-04 – 2015-10-06 (×9): 10 mg via ORAL
  Filled 2015-10-04 (×9): qty 2

## 2015-10-04 MED ORDER — SENNOSIDES-DOCUSATE SODIUM 8.6-50 MG PO TABS: 1.0000 | ORAL_TABLET | Freq: Every evening | ORAL | Status: DC | PRN

## 2015-10-04 MED ORDER — FLEET ENEMA 7-19 GM/118ML RE ENEM: 1.0000 | ENEMA | Freq: Once | RECTAL | Status: DC | PRN

## 2015-10-04 MED ORDER — SUCCINYLCHOLINE CHLORIDE 20 MG/ML IJ SOLN
INTRAMUSCULAR | Status: DC | PRN
  Administered 2015-10-04: 120 mg via INTRAVENOUS

## 2015-10-04 MED ORDER — LACTATED RINGERS IV SOLN: INTRAVENOUS | Status: DC | PRN

## 2015-10-04 MED ORDER — BUPIVACAINE HCL 0.5 % IJ SOLN
INTRAMUSCULAR | Status: DC | PRN
  Administered 2015-10-04: 30 mL

## 2015-10-04 MED ORDER — ASPIRIN EC 325 MG PO TBEC
325.0000 mg | DELAYED_RELEASE_TABLET | Freq: Every day | ORAL | Status: DC
  Administered 2015-10-05 – 2015-10-06 (×2): 325 mg via ORAL
  Filled 2015-10-04 (×2): qty 1

## 2015-10-04 MED ORDER — FENTANYL CITRATE (PF) 100 MCG/2ML IJ SOLN
25.0000 ug | INTRAMUSCULAR | Status: DC | PRN
  Administered 2015-10-04 (×3): 25 ug via INTRAVENOUS

## 2015-10-04 MED ORDER — PROPOFOL 10 MG/ML IV BOLUS
INTRAVENOUS | Status: AC
  Filled 2015-10-04: qty 20

## 2015-10-04 MED ORDER — PAROXETINE HCL 20 MG PO TABS
40.0000 mg | ORAL_TABLET | Freq: Every day | ORAL | Status: DC
  Administered 2015-10-05 – 2015-10-06 (×2): 40 mg via ORAL
  Filled 2015-10-04 (×2): qty 2

## 2015-10-04 MED ORDER — FENTANYL CITRATE (PF) 250 MCG/5ML IJ SOLN
INTRAMUSCULAR | Status: AC
  Filled 2015-10-04: qty 5

## 2015-10-04 MED ORDER — ALUM & MAG HYDROXIDE-SIMETH 200-200-20 MG/5ML PO SUSP: 30.0000 mL | ORAL | Status: DC | PRN

## 2015-10-04 MED ORDER — PANTOPRAZOLE SODIUM 40 MG PO TBEC
40.0000 mg | DELAYED_RELEASE_TABLET | Freq: Every day | ORAL | Status: DC
  Administered 2015-10-05 – 2015-10-06 (×2): 40 mg via ORAL
  Filled 2015-10-04 (×3): qty 1

## 2015-10-04 MED ORDER — ROCURONIUM BROMIDE 100 MG/10ML IV SOLN
INTRAVENOUS | Status: DC | PRN
  Administered 2015-10-04: 40 mg via INTRAVENOUS

## 2015-10-04 MED ORDER — BISACODYL 5 MG PO TBEC: 5.0000 mg | DELAYED_RELEASE_TABLET | Freq: Every day | ORAL | Status: DC | PRN

## 2015-10-04 MED ORDER — ONDANSETRON HCL 4 MG PO TABS
4.0000 mg | ORAL_TABLET | Freq: Four times a day (QID) | ORAL | Status: DC | PRN
  Administered 2015-10-05: 4 mg via ORAL
  Filled 2015-10-04: qty 1

## 2015-10-04 MED ORDER — GLYCOPYRROLATE 0.2 MG/ML IJ SOLN
INTRAMUSCULAR | Status: DC | PRN
  Administered 2015-10-04: 0.4 mg via INTRAVENOUS

## 2015-10-04 MED ORDER — INSULIN GLARGINE 100 UNIT/ML ~~LOC~~ SOLN
14.0000 [IU] | Freq: Every day | SUBCUTANEOUS | Status: DC
  Administered 2015-10-04 – 2015-10-05 (×2): 14 [IU] via SUBCUTANEOUS
  Filled 2015-10-04 (×3): qty 0.14

## 2015-10-04 MED ORDER — ASPIRIN EC 325 MG PO TBEC: 325.0000 mg | DELAYED_RELEASE_TABLET | Freq: Two times a day (BID) | ORAL | Status: DC

## 2015-10-04 MED ORDER — PHENOL 1.4 % MT LIQD: 1.0000 | OROMUCOSAL | Status: DC | PRN

## 2015-10-04 MED ORDER — LACTATED RINGERS IV SOLN
INTRAVENOUS | Status: DC
  Administered 2015-10-04 (×2): via INTRAVENOUS

## 2015-10-04 MED ORDER — METOCLOPRAMIDE HCL 5 MG PO TABS: 5.0000 mg | ORAL_TABLET | Freq: Three times a day (TID) | ORAL | Status: DC | PRN

## 2015-10-04 MED ORDER — MIDAZOLAM HCL 2 MG/2ML IJ SOLN
INTRAMUSCULAR | Status: AC
  Filled 2015-10-04: qty 2

## 2015-10-04 MED ORDER — DIPHENHYDRAMINE HCL 12.5 MG/5ML PO ELIX: 12.5000 mg | ORAL_SOLUTION | ORAL | Status: DC | PRN

## 2015-10-04 MED ORDER — PROMETHAZINE HCL 25 MG/ML IJ SOLN: 6.2500 mg | INTRAMUSCULAR | Status: DC | PRN

## 2015-10-04 MED ORDER — EPHEDRINE SULFATE 50 MG/ML IJ SOLN
INTRAMUSCULAR | Status: AC
  Filled 2015-10-04: qty 1

## 2015-10-04 MED ORDER — METOPROLOL SUCCINATE ER 25 MG PO TB24
25.0000 mg | ORAL_TABLET | Freq: Every day | ORAL | Status: DC
  Administered 2015-10-05 – 2015-10-06 (×2): 25 mg via ORAL
  Filled 2015-10-04 (×2): qty 1

## 2015-10-04 MED ORDER — ONDANSETRON HCL 4 MG/2ML IJ SOLN
INTRAMUSCULAR | Status: AC
  Filled 2015-10-04: qty 2

## 2015-10-04 MED ORDER — METHOCARBAMOL 1000 MG/10ML IJ SOLN
500.0000 mg | Freq: Four times a day (QID) | INTRAVENOUS | Status: DC | PRN
  Filled 2015-10-04: qty 5

## 2015-10-04 MED ORDER — ACETAMINOPHEN 325 MG PO TABS
650.0000 mg | ORAL_TABLET | Freq: Four times a day (QID) | ORAL | Status: DC | PRN
  Administered 2015-10-05 (×2): 650 mg via ORAL
  Filled 2015-10-04 (×2): qty 2

## 2015-10-04 MED ORDER — TRANEXAMIC ACID 1000 MG/10ML IV SOLN
2000.0000 mg | INTRAVENOUS | Status: AC
  Administered 2015-10-04: 2000 mg via TOPICAL
  Filled 2015-10-04: qty 20

## 2015-10-04 MED ORDER — INSULIN ASPART 100 UNIT/ML ~~LOC~~ SOLN
0.0000 [IU] | Freq: Three times a day (TID) | SUBCUTANEOUS | Status: DC
  Administered 2015-10-05: 8 [IU] via SUBCUTANEOUS
  Administered 2015-10-05: 3 [IU] via SUBCUTANEOUS
  Administered 2015-10-05: 5 [IU] via SUBCUTANEOUS
  Administered 2015-10-06: 3 [IU] via SUBCUTANEOUS

## 2015-10-04 MED ORDER — OXYCODONE-ACETAMINOPHEN 5-325 MG PO TABS: 1.0000 | ORAL_TABLET | ORAL | Status: DC | PRN

## 2015-10-04 MED ORDER — TRANEXAMIC ACID 1000 MG/10ML IV SOLN
1000.0000 mg | Freq: Once | INTRAVENOUS | Status: AC
  Administered 2015-10-04: 1000 mg via INTRAVENOUS
  Filled 2015-10-04: qty 10

## 2015-10-04 MED ORDER — KCL IN DEXTROSE-NACL 20-5-0.45 MEQ/L-%-% IV SOLN
INTRAVENOUS | Status: DC
  Administered 2015-10-04 – 2015-10-05 (×2): via INTRAVENOUS
  Filled 2015-10-04 (×2): qty 1000

## 2015-10-04 MED ORDER — SODIUM CHLORIDE 0.9 % IJ SOLN
INTRAMUSCULAR | Status: DC | PRN
  Administered 2015-10-04: 10 mL

## 2015-10-04 MED ORDER — LIDOCAINE HCL (CARDIAC) 20 MG/ML IV SOLN
INTRAVENOUS | Status: AC
  Filled 2015-10-04: qty 5

## 2015-10-04 MED ORDER — MEPERIDINE HCL 25 MG/ML IJ SOLN: 6.2500 mg | INTRAMUSCULAR | Status: DC | PRN

## 2015-10-04 MED ORDER — METFORMIN HCL 500 MG PO TABS
500.0000 mg | ORAL_TABLET | Freq: Two times a day (BID) | ORAL | Status: DC
  Administered 2015-10-05 – 2015-10-06 (×3): 500 mg via ORAL
  Filled 2015-10-04 (×3): qty 1

## 2015-10-04 MED ORDER — HYDROMORPHONE HCL 1 MG/ML IJ SOLN
0.5000 mg | INTRAMUSCULAR | Status: DC | PRN
  Administered 2015-10-04 – 2015-10-05 (×5): 1 mg via INTRAVENOUS
  Filled 2015-10-04 (×5): qty 1

## 2015-10-04 MED ORDER — LIDOCAINE HCL (CARDIAC) 20 MG/ML IV SOLN
INTRAVENOUS | Status: DC | PRN
  Administered 2015-10-04: 100 mg via INTRAVENOUS

## 2015-10-04 MED ORDER — METHOCARBAMOL 500 MG PO TABS: 500.0000 mg | ORAL_TABLET | Freq: Two times a day (BID) | ORAL | Status: DC

## 2015-10-04 MED ORDER — MENTHOL 3 MG MT LOZG: 1.0000 | LOZENGE | OROMUCOSAL | Status: DC | PRN

## 2015-10-04 MED ORDER — PHENYLEPHRINE 40 MCG/ML (10ML) SYRINGE FOR IV PUSH (FOR BLOOD PRESSURE SUPPORT)
PREFILLED_SYRINGE | INTRAVENOUS | Status: AC
  Filled 2015-10-04: qty 10

## 2015-10-04 MED ORDER — SUCCINYLCHOLINE CHLORIDE 20 MG/ML IJ SOLN
INTRAMUSCULAR | Status: AC
  Filled 2015-10-04: qty 1

## 2015-10-04 MED ORDER — DOCUSATE SODIUM 100 MG PO CAPS
100.0000 mg | ORAL_CAPSULE | Freq: Two times a day (BID) | ORAL | Status: DC
  Administered 2015-10-04 – 2015-10-06 (×4): 100 mg via ORAL
  Filled 2015-10-04 (×4): qty 1

## 2015-10-04 MED ORDER — AMLODIPINE BESYLATE 5 MG PO TABS
5.0000 mg | ORAL_TABLET | Freq: Every day | ORAL | Status: DC
  Administered 2015-10-05 – 2015-10-06 (×2): 5 mg via ORAL
  Filled 2015-10-04 (×2): qty 1

## 2015-10-04 MED ORDER — METOCLOPRAMIDE HCL 5 MG/ML IJ SOLN
5.0000 mg | Freq: Three times a day (TID) | INTRAMUSCULAR | Status: DC | PRN
  Administered 2015-10-05: 10 mg via INTRAVENOUS
  Filled 2015-10-04: qty 2

## 2015-10-04 MED ORDER — EPHEDRINE SULFATE 50 MG/ML IJ SOLN
INTRAMUSCULAR | Status: DC | PRN
  Administered 2015-10-04 (×2): 10 mg via INTRAVENOUS

## 2015-10-04 MED ORDER — ONDANSETRON HCL 4 MG/2ML IJ SOLN
4.0000 mg | Freq: Four times a day (QID) | INTRAMUSCULAR | Status: DC | PRN
  Administered 2015-10-04: 4 mg via INTRAVENOUS
  Filled 2015-10-04: qty 2

## 2015-10-04 MED ORDER — PHENYLEPHRINE HCL 10 MG/ML IJ SOLN
INTRAMUSCULAR | Status: DC | PRN
  Administered 2015-10-04 (×2): 80 ug via INTRAVENOUS
  Administered 2015-10-04 (×2): 160 ug via INTRAVENOUS
  Administered 2015-10-04 (×4): 80 ug via INTRAVENOUS

## 2015-10-04 MED ORDER — ACETAMINOPHEN 650 MG RE SUPP: 650.0000 mg | Freq: Four times a day (QID) | RECTAL | Status: DC | PRN

## 2015-10-04 MED ORDER — SODIUM CHLORIDE 0.9 % IR SOLN
Status: DC | PRN
Start: 2015-10-04 — End: 2015-10-04
  Administered 2015-10-04: 3000 mL

## 2015-10-04 MED ORDER — FENTANYL CITRATE (PF) 100 MCG/2ML IJ SOLN
INTRAMUSCULAR | Status: AC
  Filled 2015-10-04: qty 2

## 2015-10-04 MED ORDER — 0.9 % SODIUM CHLORIDE (POUR BTL) OPTIME
TOPICAL | Status: DC | PRN
Start: 2015-10-04 — End: 2015-10-04
  Administered 2015-10-04: 1000 mL

## 2015-10-04 MED ORDER — FENTANYL CITRATE (PF) 100 MCG/2ML IJ SOLN
INTRAMUSCULAR | Status: DC | PRN
  Administered 2015-10-04: 100 ug via INTRAVENOUS
  Administered 2015-10-04: 50 ug via INTRAVENOUS
  Administered 2015-10-04: 100 ug via INTRAVENOUS
  Administered 2015-10-04: 50 ug via INTRAVENOUS
  Administered 2015-10-04: 100 ug via INTRAVENOUS
  Administered 2015-10-04 (×2): 50 ug via INTRAVENOUS

## 2015-10-04 MED ORDER — PROPOFOL 10 MG/ML IV BOLUS
INTRAVENOUS | Status: DC | PRN
  Administered 2015-10-04: 200 mg via INTRAVENOUS

## 2015-10-04 MED ORDER — METHOCARBAMOL 500 MG PO TABS
500.0000 mg | ORAL_TABLET | Freq: Four times a day (QID) | ORAL | Status: DC | PRN
  Administered 2015-10-04 – 2015-10-06 (×6): 500 mg via ORAL
  Filled 2015-10-04 (×6): qty 1

## 2015-10-04 MED ORDER — TRAZODONE HCL 100 MG PO TABS
200.0000 mg | ORAL_TABLET | Freq: Every day | ORAL | Status: DC
  Administered 2015-10-04 – 2015-10-05 (×2): 200 mg via ORAL
  Filled 2015-10-04 (×2): qty 2

## 2015-10-04 MED ORDER — VANCOMYCIN HCL 1000 MG IV SOLR
1000.0000 mg | INTRAVENOUS | Status: DC | PRN
  Administered 2015-10-04: 1000 mg via INTRAVENOUS

## 2015-10-04 MED ORDER — NITROGLYCERIN 0.4 MG SL SUBL: 0.4000 mg | SUBLINGUAL_TABLET | SUBLINGUAL | Status: DC | PRN

## 2015-10-04 MED ORDER — GLIPIZIDE ER 10 MG PO TB24
10.0000 mg | ORAL_TABLET | Freq: Two times a day (BID) | ORAL | Status: DC
  Administered 2015-10-04 – 2015-10-06 (×4): 10 mg via ORAL
  Filled 2015-10-04 (×6): qty 1

## 2015-10-04 MED ORDER — NEOSTIGMINE METHYLSULFATE 10 MG/10ML IV SOLN
INTRAVENOUS | Status: DC | PRN
  Administered 2015-10-04: 3 mg via INTRAVENOUS

## 2015-10-04 MED ORDER — MIDAZOLAM HCL 5 MG/5ML IJ SOLN
INTRAMUSCULAR | Status: DC | PRN
  Administered 2015-10-04: 2 mg via INTRAVENOUS

## 2015-10-04 MED ORDER — ONDANSETRON HCL 4 MG/2ML IJ SOLN
INTRAMUSCULAR | Status: DC | PRN
  Administered 2015-10-04: 4 mg via INTRAVENOUS

## 2015-10-04 MED ORDER — FUROSEMIDE 20 MG PO TABS
20.0000 mg | ORAL_TABLET | Freq: Every day | ORAL | Status: DC
  Administered 2015-10-05 – 2015-10-06 (×2): 20 mg via ORAL
  Filled 2015-10-04 (×2): qty 1

## 2015-10-04 MED ORDER — ROSUVASTATIN CALCIUM 10 MG PO TABS
20.0000 mg | ORAL_TABLET | Freq: Every day | ORAL | Status: DC
  Administered 2015-10-05 – 2015-10-06 (×2): 20 mg via ORAL
  Filled 2015-10-04 (×3): qty 2

## 2015-10-04 MED ORDER — ROCURONIUM BROMIDE 50 MG/5ML IV SOLN
INTRAVENOUS | Status: AC
  Filled 2015-10-04: qty 1

## 2015-10-04 MED ORDER — LISINOPRIL 5 MG PO TABS
5.0000 mg | ORAL_TABLET | Freq: Every day | ORAL | Status: DC
  Administered 2015-10-05 – 2015-10-06 (×2): 5 mg via ORAL
  Filled 2015-10-04 (×3): qty 1

## 2015-10-04 SURGICAL SUPPLY — 56 items
BANDAGE ESMARK 6X9 LF (GAUZE/BANDAGES/DRESSINGS) ×1 IMPLANT
BLADE SAG 18X100X1.27 (BLADE) ×3 IMPLANT
BLADE SAW SGTL 13X75X1.27 (BLADE) ×3 IMPLANT
BLADE SURG ROTATE 9660 (MISCELLANEOUS) IMPLANT
BNDG ELASTIC 6X10 VLCR STRL LF (GAUZE/BANDAGES/DRESSINGS) IMPLANT
BNDG ELASTIC 6X15 VLCR STRL LF (GAUZE/BANDAGES/DRESSINGS) ×3 IMPLANT
BNDG ESMARK 6X9 LF (GAUZE/BANDAGES/DRESSINGS) ×3
BOWL SMART MIX CTS (DISPOSABLE) ×3 IMPLANT
CAPT KNEE TOTAL 3 ATTUNE ×3 IMPLANT
CEMENT HV SMART SET (Cement) ×6 IMPLANT
COVER SURGICAL LIGHT HANDLE (MISCELLANEOUS) ×3 IMPLANT
CUFF TOURNIQUET SINGLE 34IN LL (TOURNIQUET CUFF) ×3 IMPLANT
CUFF TOURNIQUET SINGLE 44IN (TOURNIQUET CUFF) IMPLANT
DRAPE EXTREMITY T 121X128X90 (DRAPE) ×3 IMPLANT
DRAPE U-SHAPE 47X51 STRL (DRAPES) ×3 IMPLANT
DRSG AQUACEL AG ADV 3.5X10 (GAUZE/BANDAGES/DRESSINGS) ×3 IMPLANT
DURAPREP 26ML APPLICATOR (WOUND CARE) ×6 IMPLANT
ELECT REM PT RETURN 9FT ADLT (ELECTROSURGICAL) ×3
ELECTRODE REM PT RTRN 9FT ADLT (ELECTROSURGICAL) ×1 IMPLANT
EVACUATOR 1/8 PVC DRAIN (DRAIN) IMPLANT
GLOVE BIO SURGEON STRL SZ7.5 (GLOVE) ×6 IMPLANT
GLOVE BIO SURGEON STRL SZ8.5 (GLOVE) ×3 IMPLANT
GLOVE BIOGEL PI IND STRL 6.5 (GLOVE) ×1 IMPLANT
GLOVE BIOGEL PI IND STRL 8 (GLOVE) ×2 IMPLANT
GLOVE BIOGEL PI IND STRL 9 (GLOVE) ×1 IMPLANT
GLOVE BIOGEL PI INDICATOR 6.5 (GLOVE) ×2
GLOVE BIOGEL PI INDICATOR 8 (GLOVE) ×4
GLOVE BIOGEL PI INDICATOR 9 (GLOVE) ×2
GLOVE SURG SS PI 6.5 STRL IVOR (GLOVE) ×6 IMPLANT
GOWN STRL REUS W/ TWL LRG LVL3 (GOWN DISPOSABLE) ×1 IMPLANT
GOWN STRL REUS W/ TWL XL LVL3 (GOWN DISPOSABLE) ×3 IMPLANT
GOWN STRL REUS W/TWL LRG LVL3 (GOWN DISPOSABLE) ×2
GOWN STRL REUS W/TWL XL LVL3 (GOWN DISPOSABLE) ×6
HANDPIECE INTERPULSE COAX TIP (DISPOSABLE) ×2
HOOD PEEL AWAY FACE SHEILD DIS (HOOD) ×6 IMPLANT
KIT BASIN OR (CUSTOM PROCEDURE TRAY) ×3 IMPLANT
KIT ROOM TURNOVER OR (KITS) ×3 IMPLANT
MANIFOLD NEPTUNE II (INSTRUMENTS) ×3 IMPLANT
NEEDLE SPNL 18GX3.5 QUINCKE PK (NEEDLE) ×3 IMPLANT
NS IRRIG 1000ML POUR BTL (IV SOLUTION) ×3 IMPLANT
PACK TOTAL JOINT (CUSTOM PROCEDURE TRAY) ×3 IMPLANT
PAD ARMBOARD 7.5X6 YLW CONV (MISCELLANEOUS) ×3 IMPLANT
SET HNDPC FAN SPRY TIP SCT (DISPOSABLE) ×1 IMPLANT
SUT VIC AB 0 CT1 27 (SUTURE) ×2
SUT VIC AB 0 CT1 27XBRD ANBCTR (SUTURE) ×1 IMPLANT
SUT VIC AB 1 CTX 36 (SUTURE) ×2
SUT VIC AB 1 CTX36XBRD ANBCTR (SUTURE) ×1 IMPLANT
SUT VIC AB 2-0 CT1 27 (SUTURE) ×2
SUT VIC AB 2-0 CT1 TAPERPNT 27 (SUTURE) ×1 IMPLANT
SUT VIC AB 3-0 CT1 27 (SUTURE) ×2
SUT VIC AB 3-0 CT1 TAPERPNT 27 (SUTURE) ×1 IMPLANT
SYR 50ML LL SCALE MARK (SYRINGE) ×3 IMPLANT
TOWEL OR 17X24 6PK STRL BLUE (TOWEL DISPOSABLE) ×3 IMPLANT
TOWEL OR 17X26 10 PK STRL BLUE (TOWEL DISPOSABLE) ×3 IMPLANT
TRAY CATH 16FR W/PLASTIC CATH (SET/KITS/TRAYS/PACK) IMPLANT
WATER STERILE IRR 1000ML POUR (IV SOLUTION) IMPLANT

## 2015-10-04 NOTE — Interval H&P Note (Signed)
History and Physical Interval Note:  10/04/2015 12:38 PM  Kyle [name]  has presented today for surgery, with the diagnosis of RIGHT KNEE OSTEOARTHRITIS  The various methods of treatment have been discussed with the patient and family. After consideration of risks, benefits and other options for treatment, the patient has consented to  Procedure(s): TOTAL KNEE ARTHROPLASTY (Right) as a surgical intervention .  The patient's history has been reviewed, patient examined, no change in status, stable for surgery.  I have reviewed the patient's chart and labs.  Questions were answered to the patient's satisfaction.     [name]

## 2015-10-04 NOTE — Op Note (Signed)
PATIENT ID:      Kyle [name]  MRN:     [id number] DOB/AGE:    01-Dec-1960 / 55 y.o.       OPERATIVE REPORT    DATE OF PROCEDURE:  10/04/2015       PREOPERATIVE DIAGNOSIS:   RIGHT KNEE OSTEOARTHRITIS      Estimated body mass index is 37.72 kg/(m^2) as calculated from the following:   Height as of this encounter: 5\' 8"  (1.727 m).   Weight as of this encounter: 112.492 kg (248 lb).                                                        POSTOPERATIVE DIAGNOSIS:   RIGHT KNEE OSTEOARTHRITIS                                                                      PROCEDURE:  Procedure(s): TOTAL KNEE ARTHROPLASTY Using DepuyAttune RP implants #5R Femur, #6Tibia, 5 mm Attune RP bearing, 41 Patella     SURGEON: India Jolin J    ASSISTANT:   Eric K. [employer]   (Present and scrubbed throughout the case, critical for assistance with exposure, retraction, instrumentation, and closure.)         ANESTHESIA: GET, 20cc Exparel, 20cc 0.5% Marcaine  EBL: 300  FLUID REPLACEMENT: 1600 crystalloid  TOURNIQUET TIME:  Drains: None  Tranexamic Acid: 1gm iv 2gm topical   COMPLICATIONS:  None         INDICATIONS FOR PROCEDURE: The patient has  RIGHT KNEE OSTEOARTHRITIS, Var deformities, XR shows bone on bone arthritis, lateral subluxation of tibia. Patient has failed all conservative measures including anti-inflammatory medicines, narcotics, attempts at  exercise and weight loss, cortisone injections and viscosupplementation.  Risks and benefits of surgery have been discussed, questions answered.   DESCRIPTION OF PROCEDURE: The patient identified by armband, received  IV antibiotics, in the holding area at [facility]. Patient taken to the operating room, appropriate anesthetic  monitors were attached, and GET anesthesia was  induced. Tourniquet  applied high to the operative thigh. Lateral post and foot positioner  applied to the table, the lower extremity was then prepped and draped  in usual  sterile fashion from the toes to the tourniquet. Time-out procedure was performed. We began the operation, with the knee flexed 120 degrees, by making the anterior midline incision starting at handbreadth above the patella going over the patella 1 cm medial to and 4 cm distal to the tibial tubercle. Small bleeders in the skin and the  subcutaneous tissue identified and cauterized. Transverse retinaculum was incised and reflected medially and a medial parapatellar arthrotomy was accomplished. the patella was everted and theprepatellar fat pad resected. The superficial medial collateral  ligament was then elevated from anterior to posterior along the proximal  flare of the tibia and anterior half of the menisci resected. The knee was hyperflexed exposing bone on bone arthritis. Peripheral and notch osteophytes as well as the cruciate ligaments were then resected. We continued to  work our way around posteriorly along the proximal tibia,  and externally  rotated the tibia subluxing it out from underneath the femur. A McHale  retractor was placed through the notch and a lateral Hohmann retractor  placed, and we then drilled through the proximal tibia in line with the  axis of the tibia followed by an intramedullary guide rod and 2-degree  posterior slope cutting guide. The tibial cutting guide, 3 degree posterior sloped, was pinned into place allowing resection of 5 mm of bone medially and 10 mm of bone laterally. Satisfied with the tibial resection, we then  entered the distal femur 2 mm anterior to the PCL origin with the  intramedullary guide rod and applied the distal femoral cutting guide  set at 9 mm, with 5 degrees of valgus. This was pinned along the  epicondylar axis. At this point, the distal femoral cut was accomplished without difficulty. We then sized for a #5R femoral component and pinned the guide in 3 degrees of external rotation. The chamfer cutting guide was pinned into place. The anterior,  posterior, and chamfer cuts were accomplished without difficulty followed by  the Attune RP box cutting guide and the box cut. We also removed posterior osteophytes from the posterior femoral condyles. At this  time, the knee was brought into full extension. We checked our  extension and flexion gaps and found them symmetric for a 5 mm bearing. Distracting in extension with a lamina spreader, the posterior horns of the menisci were removed, and Exparel, diluted to 60 cc, with 20cc NS, and 20cc 0.5% Marcaine,was injected into the capsule and synovium of the knee. The posterior patella cut was accomplished with the 9.5 mm Attune cutting guide, sized for a 41mm dome, and the fixation pegs drilled.The knee  was then once again hyperflexed exposing the proximal tibia. We sized for a # 6 tibial base plate, applied the smokestack and the conical reamer followed by the the Delta fin keel punch. We then hammered into place the Attune RP trial femoral component, drilled the lugs, inserted a  5 mm trial bearing, trial patellar button, and took the knee through range of motion from 0-130 degrees. No thumb pressure was required for patellar Tracking. At this point, the limb was wrapped with an Esmarch bandage and the tourniquet inflated to 350 mmHg. All trial components were removed, mating surfaces irrigated with pulse lavage, and dried with suction and sponges. A double batch of DePuy HV cement with 1500 mg of Zinacef was mixed and applied to all bony metallic mating surfaces except for the posterior condyles of the femur itself. In order, we  hammered into place the tibial tray and removed excess cement, the femoral component and removed excess cement. The final Attune RP bearing  was inserted, and the knee brought to full extension with compression.  The patellar button was clamped into place, and excess cement  removed. While the cement cured the wound was irrigated out with normal saline solution pulse lavage.  Ligament stability and patellar tracking were checked and found to be excellent. The parapatellar arthrotomy was closed with  running #1 Vicryl suture. The subcutaneous tissue with 0 and 2-0 undyed  Vicryl suture, and the skin with running 3-0 SQ vicryl. A dressing of Xeroform,  4 x 4, dressing sponges, Webril, and Ace wrap applied. The patient  awakened, and taken to recovery room without difficulty.   Gean Birchwood J 10/04/2015, 2:44 PM

## 2015-10-04 NOTE — Anesthesia Postprocedure Evaluation (Signed)
Anesthesia Post Note  Patient: Kyle [name]  Procedure(s) Performed: Procedure(s) (LRB): RIGHT TOTAL KNEE ARTHROPLASTY (Right)  Patient location during evaluation: PACU Anesthesia Type: General Level of consciousness: awake Pain management: pain level controlled Vital Signs Assessment: post-procedure vital signs reviewed and stable Respiratory status: spontaneous breathing Cardiovascular status: stable Postop Assessment: no signs of nausea or vomiting Anesthetic complications: no    Last Vitals:  Filed Vitals:   10/04/15 1624 10/04/15 1645  BP: 117/58 123/80  Pulse: 98 91  Temp:    Resp: 19 12    Last Pain:  Filed Vitals:   10/04/15 1648  PainSc: 9                  Charlot Gouin

## 2015-10-04 NOTE — Anesthesia Procedure Notes (Signed)
Procedure Name: Intubation Date/Time: 10/04/2015 1:33 PM Performed by: Gavin Pound, Kathryne Ramella J Pre-anesthesia Checklist: Patient identified, Emergency Drugs available, Suction available, Patient being monitored and Timeout performed Patient Re-evaluated:Patient Re-evaluated prior to inductionOxygen Delivery Method: Circle system utilized Preoxygenation: Pre-oxygenation with 100% oxygen Intubation Type: IV induction Ventilation: Mask ventilation without difficulty Laryngoscope Size: Glidescope Tube type: Oral Tube size: 7.5 mm Number of attempts: 1 Placement Confirmation: ETT inserted through vocal cords under direct vision,  positive ETCO2 and breath sounds checked- equal and bilateral Secured at: 21 cm Tube secured with: Tape Dental Injury: Teeth and Oropharynx as per pre-operative assessment

## 2015-10-04 NOTE — Progress Notes (Signed)
Utilization review completed.  

## 2015-10-04 NOTE — Discharge Instructions (Signed)

## 2015-10-04 NOTE — Transfer of Care (Signed)
Immediate Anesthesia Transfer of Care Note  Patient: Kyle [name]  Procedure(s) Performed: Procedure(s): RIGHT TOTAL KNEE ARTHROPLASTY (Right)  Patient Location: PACU  Anesthesia Type:General  Level of Consciousness: awake and alert   Airway & Oxygen Therapy: Patient Spontanous Breathing and Patient connected to nasal cannula oxygen  Post-op Assessment: Report given to RN and Post -op Vital signs reviewed and stable  Post vital signs: Reviewed and stable  Last Vitals:  Filed Vitals:   10/04/15 1052 10/04/15 1609  BP: 120/67 100/58  Pulse: 81 101  Temp: 36.7 C 36.7 C  Resp:  16    Complications: No apparent anesthesia complications

## 2015-10-04 NOTE — Progress Notes (Signed)
Orthopedic Tech Progress Note Patient Details:  Kyle [name] [birth year] [id number]  CPM Right Knee CPM Right Knee: On Right Knee Flexion (Degrees): 40 Right Knee Extension (Degrees): 0 Additional Comments: trapeze bar patient helper Viewed order from doctor's order list  Nikki Dom 10/04/2015, 4:40 PM

## 2015-10-05 ENCOUNTER — Encounter (HOSPITAL_COMMUNITY): Payer: Self-pay | Admitting: Orthopedic Surgery

## 2015-10-05 LAB — BASIC METABOLIC PANEL
Anion gap: 10 (ref 5–15)
BUN: 5 mg/dL — AB (ref 6–20)
CHLORIDE: 97 mmol/L — AB (ref 101–111)
CO2: 27 mmol/L (ref 22–32)
CREATININE: 0.86 mg/dL (ref 0.61–1.24)
Calcium: 8.7 mg/dL — ABNORMAL LOW (ref 8.9–10.3)
GFR calc Af Amer: >60 mL/min (ref >60)
GFR calc non Af Amer: >60 mL/min (ref >60)
GLUCOSE: 189 mg/dL — AB (ref 65–99)
POTASSIUM: 4.4 mmol/L (ref 3.5–5.1)
SODIUM: 134 mmol/L — AB (ref 135–145)

## 2015-10-05 LAB — HEMOGLOBIN A1C
Hgb A1c MFr Bld: 7.8 % — ABNORMAL HIGH (ref 4.8–5.6)
Mean Plasma Glucose: 177 mg/dL

## 2015-10-05 LAB — CBC
HCT: 34.8 % — ABNORMAL LOW (ref 39.0–52.0)
HEMOGLOBIN: 11.5 g/dL — AB (ref 13.0–17.0)
MCH: 30.3 pg (ref 26.0–34.0)
MCHC: 33 g/dL (ref 30.0–36.0)
MCV: 91.8 fL (ref 78.0–100.0)
Platelets: 200 10*3/uL (ref 150–400)
RBC: 3.79 MIL/uL — AB (ref 4.22–5.81)
RDW: 13.8 % (ref 11.5–15.5)
WBC: 8.4 10*3/uL (ref 4.0–10.5)

## 2015-10-05 LAB — GLUCOSE, CAPILLARY
GLUCOSE-CAPILLARY: 201 mg/dL — AB (ref 65–99)
GLUCOSE-CAPILLARY: 262 mg/dL — AB (ref 65–99)
Glucose-Capillary: 197 mg/dL — ABNORMAL HIGH (ref 65–99)
Glucose-Capillary: 116 mg/dL — ABNORMAL HIGH (ref 65–99)

## 2015-10-05 NOTE — Progress Notes (Signed)
Patient ID: Kyle [name], male   DOB: August 30, 1960, 55 y.o.   MRN: [id number] PATIENT ID: Kyle [name]  MRN: [id number]  DOB/AGE:  30-Aug-1960 / 55 y.o.  1 Day Post-Op Procedure(s) (LRB): RIGHT TOTAL KNEE ARTHROPLASTY (Right)    PROGRESS NOTE Subjective: Patient is alert, oriented, no Nausea, no Vomiting, yes passing gas. Taking PO well. Denies SOB, Chest or Calf Pain. Using Incentive Spirometer, PAS in place. Ambulate WBAT, CPM 0-40 Patient reports pain as 9/10 .    Objective: Vital signs in last 24 hours: Filed Vitals:   10/04/15 1831 10/04/15 1949 10/04/15 2355 10/05/15 0524  BP: 114/76 124/62 120/63 120/58  Pulse: 89 91 95 94  Temp: 99 F (37.2 C) 98.6 F (37 C) 98.6 F (37 C) 99.3 F (37.4 C)  TempSrc: Tympanic Oral Oral Oral  Resp: 18 18 16 18   Height:      Weight:      SpO2: 92% 94% 98% 95%      Intake/Output from previous day: I/O last 3 completed shifts: In: 1720 [P.O.:120; I.V.:1600] Out: 1800 [Urine:1700; Blood:100]   Intake/Output this shift: Total I/O In: 1862.9 [P.O.:240; I.V.:1512.9; IV Piggyback:110] Out: -    LABORATORY DATA:  Recent Labs  10/04/15 1613 10/04/15 2125 10/05/15 0505 10/05/15 0618  WBC  --   --  8.4  --   HGB  --   --  11.5*  --   HCT  --   --  34.8*  --   PLT  --   --  200  --   NA  --   --  134*  --   K  --   --  4.4  --   CL  --   --  97*  --   CO2  --   --  27  --   BUN  --   --  5*  --   CREATININE  --   --  0.86  --   GLUCOSE  --   --  189*  --   GLUCAP 127* 163*  --  201*  CALCIUM  --   --  8.7*  --     Examination: Neurologically intact ABD soft Neurovascular intact Sensation intact distally Intact pulses distally Dorsiflexion/Plantar flexion intact Incision: dressing C/D/I No cellulitis present Compartment soft}  Assessment:   1 Day Post-Op Procedure(s) (LRB): RIGHT TOTAL KNEE ARTHROPLASTY (Right) ADDITIONAL DIAGNOSIS: Expected Acute Blood Loss Anemia, Diabetes, Morbid obesity  Plan: PT/OT WBAT, CPM 5/hrs  day until ROM 0-90 degrees, then D/C CPM DVT Prophylaxis:  SCDx72hrs, ASA 325 mg BID x 2 weeks DISCHARGE PLAN: Home DISCHARGE NEEDS: HHPT, CPM and Collie Siad J 10/05/2015, 8:47 AM

## 2015-10-05 NOTE — Care Management Note (Signed)
Case Management Note  Patient Details  Name: Jeylan Arrizon MRN: [id number] Date of Birth: 1960-10-23  Subjective/Objective:  55 yr old male s/p right total knee arthroplasty.                 Action/Plan:  Case manager spoke with patient at the bedside concerning Home Health and DME needs at discharge. Choice for home health agency was provided. Referral called to Angela Adam, [facility] Coordinator. Rolling walker, 3in1 have been delivered to patient. He will have family support at discharge.   Expected Discharge Date:   10/06/15               Expected Discharge Plan:  Home w Home Health Services  In-House Referral:  NA  Discharge planning Services     Post Acute Care Choice:  Home Health Choice offered to:  Patient  DME Arranged:  N/A DME Agency:  NA  HH Arranged:  PT HH Agency:  [facility]  Status of Service:  Completed, signed off  Medicare Important Message Given:    Date Medicare IM Given:    Medicare IM give by:    Date Additional Medicare IM Given:    Additional Medicare Important Message give by:     If discussed at Long Length of Stay Meetings, dates discussed:    Additional Comments:  Durenda Guthrie, RN 10/05/2015, 10:10 AM

## 2015-10-05 NOTE — Evaluation (Signed)
Occupational Therapy Evaluation Patient Details Name: Kyle [name] MRN: [id number] DOB: 1960/09/20 Today's Date: 10/05/2015    History of Present Illness 55 yo admitted for Right TKA. PMHx: obesity, HTN, DM, CAD   Clinical Impression   Patient presenting with decreased ADL and functional mobility independence secondary to above. Patient independent PTA. Patient currently functioning at an overall mod to max assist level (pt required +2 assist for sit to supine for physical assistance AND safety). Patient will benefit from acute OT to increase overall independence in the areas of ADLs, functional mobility, and overall safety in order to safely discharge home with wife & daughter-in-law vs SNF - depending on progress.     Follow Up Recommendations  Home health OT;Supervision/Assistance - 24 hour (may need SNF, depending on progress)    Equipment Recommendations  Other (comment) (TBD)    Recommendations for Other Services  None at this time    Precautions / Restrictions Precautions Precautions: Knee;Fall Restrictions Weight Bearing Restrictions: Yes RLE Weight Bearing: Weight bearing as tolerated    Mobility Bed Mobility Overal bed mobility: Needs Assistance Bed Mobility: Supine to Sit;Sit to Supine     Supine to sit: Mod assist;HOB elevated Sit to supine: Mod assist;+2 for physical assistance;+2 for safety/equipment   General bed mobility comments: Max cues for sequencing, max use of bed rails, assistance needed for management of RLE. Pt seated close to EOB prior to sit to supine and required +2 assist for safety and to physcially assist with scooting hips back onto bed.   Transfers Overall transfer level: Needs assistance Equipment used: Rolling walker (2 wheeled) Transfers: Sit to/from Stand Sit to Stand: Mod assist General transfer comment: Heavy mod assist to come into standing. Pt required max cueing for management of RLE, safety, and sequencing. Pt with increased  anxiety, pain, and nausea limiting him.     Balance Overall balance assessment: Needs assistance Sitting-balance support: Single extremity supported;Feet supported Sitting balance-Leahy Scale: Poor   Postural control: Left lateral lean (sitting EOB, pt used LUE to hold him up) Standing balance support: Bilateral upper extremity supported;During functional activity Standing balance-Leahy Scale: Poor    ADL Overall ADL's : Needs assistance/impaired Eating/Feeding: Set up;Bed level Eating/Feeding Details (indicate cue type and reason): pt unable to hold self up, EOB & unsupported, to be able to eat seated EOB Grooming: Set up;Bed level   Upper Body Bathing: Moderate assistance;Bed level   Lower Body Bathing: Maximal assistance;Bed level Lower Body Bathing Details (indicate cue type and reason): unsafe to perform EOB or sink level bathing, pt needs to perform bed level bathing at this time Upper Body Dressing : Moderate assistance;Bed level   Lower Body Dressing: Maximal assistance;Bed level Lower Body Dressing Details (indicate cue type and reason): bed level dressing for safety secondary to poor sitting balance EOB   Toilet Transfer Details (indicate cue type and reason): did not occur, pt barely able to stand from EOB and required +2 for EOB to supine transfer Toileting- Clothing Manipulation and Hygiene: Maximal assistance;Bed level     Tub/Shower Transfer Details (indicate cue type and reason): did not occur, pt barely able to stand from EOB and required +2 for EOB to supine transfer Functional mobility during ADLs: +2 for physical assistance;+2 for safety/equipment General ADL Comments: Pt required mod assist for supine to sit and mod assist for sit to/from stand. Pt with poor sitting balance EOB with BLE touching ground and left UE supporting himself.     Vision Additional Comments: no change  from baseline          Pertinent Vitals/Pain Pain Assessment: Faces Pain Score: 9   Faces Pain Scale: Hurts whole lot Pain Location: right knee at rest and with movement Pain Descriptors / Indicators: Aching;Sore;Grimacing;Guarding Pain Intervention(s): Limited activity within patient's tolerance;Monitored during session     Hand Dominance Right   Extremity/Trunk Assessment Upper Extremity Assessment Upper Extremity Assessment: Generalized weakness   Lower Extremity Assessment Lower Extremity Assessment: Defer to PT evaluation RLE Deficits / Details: decreased strength and ROM post op   Cervical / Trunk Assessment Cervical / Trunk Assessment: Normal   Communication Communication Communication: No difficulties   Cognition Arousal/Alertness: Awake/alert Behavior During Therapy: WFL for tasks assessed/performed;Anxious Overall Cognitive Status: Within Functional Limits for tasks assessed (however, pt with decreased awareness)              Home Living Family/patient expects to be discharged to:: Private residence Living Arrangements: Spouse/significant other Available Help at Discharge: Family;Available 24 hours/day (wife and daughter in law) Type of Home: House Home Access: Ramped entrance     Home Layout: One level     Bathroom Shower/Tub: Tub/shower unit;Curtain   Firefighter: Standard     Home Equipment: Environmental consultant - 2 wheels;Bedside commode   Prior Functioning/Environment Level of Independence: Independent     OT Diagnosis: Generalized weakness;Acute pain   OT Problem List: Decreased strength;Decreased range of motion;Decreased activity tolerance;Impaired balance (sitting and/or standing);Decreased safety awareness;Decreased coordination;Decreased knowledge of use of DME or AE;Decreased knowledge of precautions;Pain;Obesity   OT Treatment/Interventions: Self-care/ADL training;Therapeutic exercise;Energy conservation;DME and/or AE instruction;Therapeutic activities;Patient/family education;Balance training    OT Goals(Current goals can be  found in the care plan section) Acute Rehab OT Goals Patient Stated Goal: none stated OT Goal Formulation: With patient Time For Goal Achievement: 10/19/15 Potential to Achieve Goals: Good ADL Goals Pt Will Perform Grooming: standing;with min assist Pt Will Perform Lower Body Bathing: with min assist;sit to/from stand (using AE prn) Pt Will Perform Lower Body Dressing: with min assist;sit to/from stand (using AE prn) Pt Will Transfer to Toilet: with min assist;ambulating;bedside commode Additional ADL Goal #1: Pt will be mod I with bed mobility as a precursor for ADL and functional mobility Additional ADL Goal #2: Pt will engage in functional ambulation/mobility using LRAD with min guard assist during ADL   OT Frequency: Min 2X/week   Barriers to D/C: none known at this time    End of Session Equipment Utilized During Treatment: Gait belt;Rolling walker CPM Right Knee CPM Right Knee: Off Nurse Communication: Mobility status  Activity Tolerance: Patient limited by pain Patient left: in bed;with call bell/phone within reach;with nursing/sitter in room   Time: 1219-1238 OT Time Calculation (min): 19 min Charges:  OT General Charges $OT Visit: 1 Procedure OT Evaluation $OT Eval Moderate Complexity: 1 Procedure  Edwin Cap , MS, OTR/L, CLT Pager: [phone]  10/05/2015, 1:45 PM

## 2015-10-05 NOTE — Progress Notes (Signed)
Physical Therapy Progress Note Pt with decreased mobility with OT prior to this session. He declined any mobility at this time and agreeable to limited HEP however all exercises were assisted with little to no assist from pt and ROM only 10-25 degrees. Pt in heel foam end of session with education for need to improve function. If pt continues to be unable to progress mobility to a minguard level ST-SNF may be needed.   10/05/15 1303  PT Visit Information  Last PT Received On 10/05/15  Assistance Needed +2  History of Present Illness 55 yo admitted for Right TKA. PMHx: obesity, HTN, DM, CAD  PT Time Calculation  PT Start Time (ACUTE ONLY) 1245  PT Stop Time (ACUTE ONLY) 1300  PT Time Calculation (min) (ACUTE ONLY) 15 min  Subjective Data  Patient Stated Goal none stated  Precautions  Precautions Knee;Fall  Restrictions  RLE Weight Bearing WBAT  Pain Assessment  Pain Score 10  Pain Descriptors / Indicators Aching  Pain Intervention(s) Limited activity within patient's tolerance;Monitored during session;Premedicated before session;Repositioned;RN gave pain meds during session;Ice applied  Cognition  Arousal/Alertness Awake/alert  Behavior During Therapy WFL for tasks assessed/performed;Anxious  Overall Cognitive Status Within Functional Limits for tasks assessed  Exercises  Exercises Total Joint  Total Joint Exercises  Short Arc Quad AAROM;Right;10 reps;Supine  Heel Slides AAROM;Right;10 reps;Supine  Hip ABduction/ADduction AAROM;Right;10 reps;Supine  Straight Leg Raises AAROM;Right;10 reps;Supine  Quad Sets AROM;Right;10 reps;Supine  PT - Assessment/Plan  PT Plan Current plan remains appropriate  Follow Up Recommendations Home health PT;SNF;Supervision/Assistance - 24 hour (if unable to progress to meet goals SNf may be needed)  PT Goal Progression  Progress towards PT goals Not progressing toward goals - comment (limited by pain)  PT General Charges  $$ ACUTE PT VISIT 1  Procedure  PT Treatments  $Therapeutic Exercise 8-22 mins  Delaney Meigs, PT [phone]

## 2015-10-05 NOTE — Progress Notes (Signed)
Orthopedic Tech Progress Note Patient Details:  Kyle [name] 1961/07/04 [id number]  CPM Right Knee CPM Right Knee: On Right Knee Flexion (Degrees): 35 Right Knee Extension (Degrees): 0 Additional Comments:  (bone foam in place)   [name] 10/05/2015, 8:33 PM

## 2015-10-05 NOTE — Progress Notes (Signed)
Orthopedic Tech Progress Note Patient Details:  Kyle [name] 22-Mar-1961 [id number]  Patient ID: [name], male   DOB: 1960-10-22, 55 y.o.   MRN: [id number] Applied cpm 0-35. 0-40 was to painful  Trinna Post 10/05/2015, 5:20 AM

## 2015-10-05 NOTE — Evaluation (Signed)
Physical Therapy Evaluation Patient Details Name: Kyle [name] MRN: [id number] DOB: 06-Jan-1961 Today's Date: 10/05/2015   History of Present Illness  55 yo admitted for Right TKA. PMHx: obesity, HTN, DM, CAD  Clinical Impression  Pt with limited tolerance for knee ROM and activity this session. Pt with decreased strength, ROM, transfers, gait and function who will benefit from acute therapy to maximize mobility, function, strength and independence to decrease burden of care. Pt educated for CPM use, transfers, HEP, bone foam use (in use end of session), gait and mobility. Pt with report of nausea during session without emesis and elevated pain despite premedication, RN aware of all.     Follow Up Recommendations Home health PT;Supervision for mobility/OOB    Equipment Recommendations  None recommended by PT    Recommendations for Other Services OT consult     Precautions / Restrictions Precautions Precautions: Knee;Fall Restrictions Weight Bearing Restrictions: Yes RLE Weight Bearing: Weight bearing as tolerated      Mobility  Bed Mobility Overal bed mobility: Needs Assistance Bed Mobility: Supine to Sit     Supine to sit: Min assist;HOB elevated     General bed mobility comments: max cues for sequence with use of rail and HOB 25 degrees with assist to move RLE to EOB and increased time  Transfers Overall transfer level: Needs assistance   Transfers: Sit to/from Stand Sit to Stand: Min assist         General transfer comment: cues for hand placement and safety with assist for anterior translation and rise from surface  Ambulation/Gait Ambulation/Gait assistance: Min assist Ambulation Distance (Feet): 18 Feet Assistive device: Rolling walker (2 wheeled) Gait Pattern/deviations: Step-to pattern;Decreased stride length;Decreased weight shift to right;Decreased stance time - right   Gait velocity interpretation: Below normal speed for age/gender General Gait Details:  cues for posture, position in RW, sequence and safety   Stairs            Wheelchair Mobility    Modified Rankin (Stroke Patients Only)       Balance                                             Pertinent Vitals/Pain Pain Assessment: 0-10 Pain Score: 9  Pain Location: right knee Pain Descriptors / Indicators: Aching Pain Intervention(s): Limited activity within patient's tolerance;Monitored during session;Premedicated before session;Repositioned    Home Living Family/patient expects to be discharged to:: Private residence Living Arrangements: Spouse/significant other Available Help at Discharge: Family;Available 24 hours/day Type of Home: House Home Access: Ramped entrance     Home Layout: One level Home Equipment: Walker - 2 wheels;Bedside commode      Prior Function Level of Independence: Independent               Hand Dominance        Extremity/Trunk Assessment   Upper Extremity Assessment: Generalized weakness           Lower Extremity Assessment: RLE deficits/detail RLE Deficits / Details: decreased strength and ROM post op    Cervical / Trunk Assessment: Normal  Communication   Communication: No difficulties  Cognition Arousal/Alertness: Awake/alert Behavior During Therapy: WFL for tasks assessed/performed Overall Cognitive Status: Within Functional Limits for tasks assessed                      General Comments  Exercises Total Joint Exercises Short Arc QuadBarbaraann Boys;Right;10 reps;Supine Heel Slides: AAROM;Right;10 reps;Supine Hip ABduction/ADduction: AAROM;Right;10 reps;Supine Straight Leg Raises: AAROM;Right;10 reps;Supine Goniometric ROM: 10-30      Assessment/Plan    PT Assessment Patient needs continued PT services  PT Diagnosis Difficulty walking;Acute pain;Generalized weakness   PT Problem List Decreased strength;Decreased range of motion;Decreased activity tolerance;Decreased  balance;Decreased mobility;Pain;Decreased knowledge of use of DME  PT Treatment Interventions DME instruction;Gait training;Functional mobility training;Therapeutic activities;Therapeutic exercise;Patient/family education   PT Goals (Current goals can be found in the Care Plan section) Acute Rehab PT Goals Patient Stated Goal: return to deer hunting PT Goal Formulation: With patient Time For Goal Achievement: 10/12/15 Potential to Achieve Goals: Fair    Frequency 7X/week   Barriers to discharge        Co-evaluation               End of Session Equipment Utilized During Treatment: Gait belt Activity Tolerance: Patient tolerated treatment well Patient left: in chair;with call bell/phone within reach;with chair alarm set;with nursing/sitter in room Nurse Communication: Mobility status;Precautions;Weight bearing status         Time: 7262-0355 PT Time Calculation (min) (ACUTE ONLY): 26 min   Charges:   PT Evaluation $PT Eval Moderate Complexity: 1 Procedure PT Treatments $Therapeutic Activity: 8-22 mins   PT G CodesDelorse Lek 10/05/2015, 10:45 AM Delaney Meigs, PT [phone]

## 2015-10-05 NOTE — Progress Notes (Signed)
Orthopedic Tech Progress Note Patient Details:  Kyle [name] 21-Mar-1961 [id number]  CPM Right Knee CPM Right Knee: On Right Knee Flexion (Degrees): 35 Right Knee Extension (Degrees): 0 Additional Comments:  (bone foam in place)   [name] 10/05/2015, 2:02 PM

## 2015-10-06 LAB — CBC
HCT: 35.4 % — ABNORMAL LOW (ref 39.0–52.0)
Hemoglobin: 11.6 g/dL — ABNORMAL LOW (ref 13.0–17.0)
MCH: 30.6 pg (ref 26.0–34.0)
MCHC: 32.8 g/dL (ref 30.0–36.0)
MCV: 93.4 fL (ref 78.0–100.0)
PLATELETS: 220 10*3/uL (ref 150–400)
RBC: 3.79 MIL/uL — AB (ref 4.22–5.81)
RDW: 14.3 % (ref 11.5–15.5)
WBC: 10.5 10*3/uL (ref 4.0–10.5)

## 2015-10-06 LAB — GLUCOSE, CAPILLARY
GLUCOSE-CAPILLARY: 155 mg/dL — AB (ref 65–99)
Glucose-Capillary: 132 mg/dL — ABNORMAL HIGH (ref 65–99)

## 2015-10-06 NOTE — Progress Notes (Signed)
Physical Therapy Treatment Patient Details Name: Kyle [name] MRN: [id number] DOB: [birth year] Today's Date: 10/06/2015    History of Present Illness 55 yo admitted for Right TKA. PMHx: obesity, HTN, DM, CAD    PT Comments    Patient is making good progress with PT.  From a mobility standpoint anticipate patient will be ready for DC home. Patient/ spouse deny any questions or concerns.       Follow Up Recommendations  Home health PT;SNF;Supervision/Assistance - 24 hour     Equipment Recommendations  None recommended by PT    Recommendations for Other Services       Precautions / Restrictions Precautions Precautions: Knee;Fall Precaution Comments: reviewed knee precautions and no pillow under knee, putting pillow or cushion under right heel Restrictions Weight Bearing Restrictions: Yes RLE Weight Bearing: Weight bearing as tolerated    Mobility  Bed Mobility Overal bed mobility: Needs Assistance Bed Mobility: Supine to Sit     Supine to sit: Mod assist Sit to supine: Mod assist   General bed mobility comments: cues for WB through RLE  Transfers Overall transfer level: Needs assistance Equipment used: Rolling walker (2 wheeled) Transfers: Sit to/from Stand Sit to Stand: Min guard         General transfer comment: Min assist to power up into standing. Moderate cueing needed for hand placement, technique, and safety.   Ambulation/Gait Ambulation/Gait assistance: Min guard Ambulation Distance (Feet): 10 Feet Assistive device: Rolling walker (2 wheeled) Gait Pattern/deviations: Step-to pattern Gait velocity: decreased   General Gait Details: cues for progressing weight bearing through Rt LE.    Stairs            Wheelchair Mobility    Modified Rankin (Stroke Patients Only)       Balance Overall balance assessment: Needs assistance Sitting-balance support: No upper extremity supported Sitting balance-Leahy Scale: Fair     Standing balance  support: Bilateral upper extremity supported Standing balance-Leahy Scale: Poor Standing balance comment: using RW                    Cognition Arousal/Alertness: Awake/alert Behavior During Therapy: WFL for tasks assessed/performed;Anxious Overall Cognitive Status: Within Functional Limits for tasks assessed                      Exercises Total Joint Exercises Ankle Circles/Pumps: AROM;Both;15 reps Quad Sets: Strengthening;Both;10 reps Short Arc QuadBarbaraann [name];Right;10 reps;Supine (mod assist) Heel Slides: Right;AAROM;10 reps Hip ABduction/ADduction: AAROM;Right;10 reps;Supine Straight Leg Raises: Strengthening;Right;10 reps;Other (comment) (max assist) Goniometric ROM: 6-81    General Comments        Pertinent Vitals/Pain Pain Assessment: 0-10 Pain Score: 8  Faces Pain Scale: Hurts little more Pain Location: rt knee Pain Descriptors / Indicators: Aching Pain Intervention(s): Limited activity within patient's tolerance;Monitored during session    Home Living                      Prior Function            PT Goals (current goals can now be found in the care plan section) Acute Rehab PT Goals Patient Stated Goal: go home today PT Goal Formulation: With patient Time For Goal Achievement: 10/12/15 Potential to Achieve Goals: Fair Progress towards PT goals: Progressing toward goals    Frequency  7X/week    PT Plan Current plan remains appropriate    Co-evaluation             End of Session  Equipment Utilized During Treatment: Gait belt Activity Tolerance: Patient tolerated treatment well Patient left: in chair;with call bell/phone within reach;with family/visitor present     Time: 1430-1503 PT Time Calculation (min) (ACUTE ONLY): 33 min  Charges:  $Therapeutic Exercise: 23-37 mins                    G Codes:      Kyle [name], PT, CSCS Pager [phone] Office [phone]  10/06/2015, 4:41 PM

## 2015-10-06 NOTE — Progress Notes (Signed)
PATIENT ID: Kyle [name]  MRN: [id number]  DOB/AGE:  1960-08-07 / 55 y.o.  2 Days Post-Op Procedure(s) (LRB): RIGHT TOTAL KNEE ARTHROPLASTY (Right)    PROGRESS NOTE Subjective: Patient is alert, oriented, no Nausea, no Vomiting, yes passing gas. Taking PO well. Denies SOB, Chest or Calf Pain. Using Incentive Spirometer, PAS in place. Ambulate WBAT with pt walking 18 ft yesterday with therapy, CPM 0-40 Patient reports pain as 7/10 .    Objective: Vital signs in last 24 hours: Filed Vitals:   10/05/15 1300 10/05/15 1745 10/05/15 2018 10/06/15 0415  BP: 121/68  113/66 108/51  Pulse: 103  91 110  Temp: 100.4 F (38 C) 99.3 F (37.4 C) 98.6 F (37 C) 99.5 F (37.5 C)  TempSrc: Oral Oral Oral Oral  Resp: 18   20  Height:      Weight:      SpO2: 97%  94% 93%      Intake/Output from previous day: I/O last 3 completed shifts: In: 2582.9 [P.O.:960; I.V.:1512.9; IV Piggyback:110] Out: 2425 [Urine:2425]   Intake/Output this shift: Total I/O In: 120 [P.O.:120] Out: -    LABORATORY DATA:  Recent Labs  10/05/15 0505  10/05/15 1614 10/05/15 2132 10/06/15 0624 10/06/15 0736  WBC 8.4  --   --   --   --  10.5  HGB 11.5*  --   --   --   --  11.6*  HCT 34.8*  --   --   --   --  35.4*  PLT 200  --   --   --   --  220  NA 134*  --   --   --   --   --   K 4.4  --   --   --   --   --   CL 97*  --   --   --   --   --   CO2 27  --   --   --   --   --   BUN 5*  --   --   --   --   --   CREATININE 0.86  --   --   --   --   --   GLUCOSE 189*  --   --   --   --   --   GLUCAP  --   < > 262* 116* 132*  --   CALCIUM 8.7*  --   --   --   --   --   < > = values in this interval not displayed.  Examination: Neurologically intact Neurovascular intact Sensation intact distally Intact pulses distally Dorsiflexion/Plantar flexion intact Incision: dressing C/D/I No cellulitis present Compartment soft}  Assessment:   2 Days Post-Op Procedure(s) (LRB): RIGHT TOTAL KNEE ARTHROPLASTY  (Right) ADDITIONAL DIAGNOSIS: Expected Acute Blood Loss Anemia, Diabetes, obesity and psoriasis.  Plan: PT/OT WBAT, CPM 5/hrs day until ROM 0-90 degrees, then D/C CPM DVT Prophylaxis:  SCDx72hrs, ASA 325 mg BID x 2 weeks DISCHARGE PLAN: Home, when pt passes therapy goals DISCHARGE NEEDS: HHPT, CPM, Walker and 3-in-1 comode seat     Natayla Cadenhead R 10/06/2015, 10:18 AM

## 2015-10-06 NOTE — Progress Notes (Signed)
Occupational Therapy Treatment Patient Details Name: Kyle [name] MRN: [id number] DOB: 11/28/1960 Today's Date: 10/06/2015    History of present illness 55 yo admitted for Right TKA. PMHx: obesity, HTN, DM, CAD   OT comments  Patient making good progress towards OT goals, continue plan of care for now. Pt continues to be somewhat limited by increased pain and generalized weakness. Pt will need 24/7 supervision/assistance for his safety and use of RW for functional ambulation/mobility.    Follow Up Recommendations  No OT follow up;Supervision/Assistance - 24 hour    Equipment Recommendations  3 in 1 bedside comode    Recommendations for Other Services  None at this time   Precautions / Restrictions Precautions Precautions: Knee;Fall Precaution Comments: reviewed knee precautions and no pillow under knee, putting pillow or cushion under right heel Restrictions Weight Bearing Restrictions: Yes RLE Weight Bearing: Weight bearing as tolerated    Mobility Bed Mobility Overal bed mobility: Needs Assistance Bed Mobility: Sit to Supine     Supine to sit: Mod assist Sit to supine: Mod assist   General bed mobility comments: Assistance for management of BLEs and multimodal cueing for technique and safety   Transfers Overall transfer level: Needs assistance Equipment used: Rolling walker (2 wheeled) Transfers: Sit to/from Stand Sit to Stand: Min assist General transfer comment: Min assist to power up into standing. Moderate cueing needed for hand placement, technique, and safety.     Balance Overall balance assessment: Needs assistance Sitting-balance support: No upper extremity supported;Feet supported Sitting balance-Leahy Scale: Fair     Standing balance support: Bilateral upper extremity supported;During functional activity Standing balance-Leahy Scale: Poor Standing balance comment: using RW   ADL Overall ADL's : Needs assistance/impaired Eating/Feeding: Set up;Sitting   Grooming: Set up;Sitting   Upper Body Bathing: Set up;Sitting   Lower Body Bathing: Maximal assistance;Sit to/from stand Lower Body Bathing Details (indicate cue type and reason): Pt and wife report wife was assisting PTA Upper Body Dressing : Set up;Sitting   Lower Body Dressing: Maximal assistance;Sit to/from stand Lower Body Dressing Details (indicate cue type and reason): Pt and wife report wife was assisting PTA Toilet Transfer: Min guard;RW;BSC;Grab bars;Ambulation   Toileting- Clothing Manipulation and Hygiene: Minimal assistance;Sit to/from Nurse, children's Details (indicate cue type and reason): did not occur, encouraged pt to perform sponge baths initially when going home, wife agreed  Functional mobility during ADLs: Minimal assistance;Min guard;Cueing for safety;Cueing for sequencing;Rolling walker General ADL Comments: Pt doing better today and only required +1 for ADLs and functional mobility/ambulation using RW. Wife present and education provided to patient and wife.       Vision Additional Comments: no change from baseline          Cognition   Behavior During Therapy: [facility] for tasks assessed/performed;Anxious Overall Cognitive Status: Within Functional Limits for tasks assessed                 Pertinent Vitals/ Pain       Pain Assessment: Faces Faces Pain Scale: Hurts little more Pain Location: right knee Pain Descriptors / Indicators: Grimacing Pain Intervention(s): Limited activity within patient's tolerance;Monitored during session;Repositioned   Frequency Min 2X/week     Progress Toward Goals  OT Goals(current goals can now befound in the care plan section)  Progress towards OT goals: Progressing toward goals  Acute Rehab OT Goals Patient Stated Goal: go home today OT Goal Formulation: With patient/family Time For Goal Achievement: 10/19/15 Potential to Achieve Goals: Good  Plan Discharge plan remains appropriate    End of  Session Equipment Utilized During Treatment: Gait belt;Rolling walker   Activity Tolerance Patient tolerated treatment well   Patient Left in bed;with call bell/phone within reach;with bed alarm set;with family/visitor present    Time: 1610-9604 OT Time Calculation (min): 21 min  Charges: OT General Charges $OT Visit: 1 Procedure OT Treatments $Self Care/Home Management : 8-22 mins  Edwin Cap , MS, OTR/L, CLT Pager: [phone]  10/06/2015, 1:56 PM

## 2015-10-06 NOTE — Discharge Summary (Signed)
Patient ID: Kyle [name] MRN: [id number] DOB/AGE: 54/13/1962 54 y.o.  Admit date: 10/04/2015 Discharge date: 10/06/2015  Admission Diagnoses:  Principal Problem:   Primary osteoarthritis of right knee   Discharge Diagnoses:  Same  Past Medical History  Diagnosis Date  . Hypertension   . Diabetes mellitus without complication (HCC)   . Family history of adverse reaction to anesthesia     "it made my son sick"  . Depression   . GERD (gastroesophageal reflux disease)   . Arthritis   . Hyperlipemia   . Psoriasis   . Coronary artery disease     minimal CAD '12; NL stress test 06/10/14 ([facility])    Surgeries: Procedure(s): RIGHT TOTAL KNEE ARTHROPLASTY on 10/04/2015   Consultants:    Discharged Condition: Improved  Hospital Course: Kyle [name] is an 55 y.o. male who was admitted 10/04/2015 for operative treatment ofPrimary osteoarthritis of right knee. Patient has severe unremitting pain that affects sleep, daily activities, and work/hobbies. After pre-op clearance the patient was taken to the operating room on 10/04/2015 and underwent  Procedure(s): RIGHT TOTAL KNEE ARTHROPLASTY.    Patient was given perioperative antibiotics: Anti-infectives    Start     Dose/Rate Route Frequency Ordered Stop   10/04/15 1130  vancomycin (VANCOCIN) 1,500 mg in sodium chloride 0.9 % 500 mL IVPB     1,500 mg 250 mL/hr over 120 Minutes Intravenous To ShortStay Surgical 10/03/15 0942 10/04/15 1310       Patient was given sequential compression devices, early ambulation, and chemoprophylaxis to prevent DVT.  Patient benefited maximally from hospital stay and there were no complications.    Recent vital signs: Patient Vitals for the past 24 hrs:  BP Temp Temp src Pulse Resp SpO2  10/06/15 0415 (!) 108/51 mmHg 99.5 F (37.5 C) Oral (!) 110 20 93 %  10/05/15 2018 113/66 mmHg 98.6 F (37 C) Oral 91 - 94 %  10/05/15 1745 - 99.3 F (37.4 C) Oral - - -  10/05/15 1300 121/68 mmHg (!) 100.4 F (38 C)  Oral (!) 103 18 97 %     Recent laboratory studies:  Recent Labs  10/05/15 0505 10/06/15 0736  WBC 8.4 10.5  HGB 11.5* 11.6*  HCT 34.8* 35.4*  PLT 200 220  NA 134*  --   K 4.4  --   CL 97*  --   CO2 27  --   BUN 5*  --   CREATININE 0.86  --   GLUCOSE 189*  --   CALCIUM 8.7*  --      Discharge Medications:     Medication List    STOP taking these medications        aspirin 325 MG tablet  Replaced by:  aspirin EC 325 MG tablet     HYDROcodone-acetaminophen 10-325 MG tablet  Commonly known as:  NORCO      TAKE these medications        amLODipine 5 MG tablet  Commonly known as:  NORVASC  Take 5 mg by mouth daily.     aspirin EC 325 MG tablet  Take 1 tablet (325 mg total) by mouth 2 (two) times daily.     BYDUREON 2 MG Pen  Generic drug:  Exenatide ER  Inject 2 mg into the skin once a week.     ENBREL SURECLICK 50 MG/ML injection  Generic drug:  etanercept  Inject 50 mg into the skin once a week.     furosemide 20 MG tablet  Commonly known as:  LASIX  Take 20 mg by mouth daily.     glipiZIDE 10 MG 24 hr tablet  Commonly known as:  GLUCOTROL XL  Take 10 mg by mouth 2 (two) times daily.     insulin glargine 100 UNIT/ML injection  Commonly known as:  LANTUS  Inject 14 Units into the skin at bedtime.     lisinopril 5 MG tablet  Commonly known as:  PRINIVIL,ZESTRIL  Take 5 mg by mouth daily.     metFORMIN 500 MG tablet  Commonly known as:  GLUCOPHAGE  Take by mouth 2 (two) times daily with a meal.     methocarbamol 500 MG tablet  Commonly known as:  ROBAXIN  Take 1 tablet (500 mg total) by mouth 2 (two) times daily with a meal.     metoprolol succinate 25 MG 24 hr tablet  Commonly known as:  TOPROL-XL  Take 25 mg by mouth daily.     nitroGLYCERIN 0.4 MG SL tablet  Commonly known as:  NITROSTAT  Place 0.4 mg under the tongue every 5 (five) minutes as needed for chest pain.     omeprazole 40 MG capsule  Commonly known as:  PRILOSEC  Take 40  mg by mouth daily.     oxyCODONE-acetaminophen 5-325 MG tablet  Commonly known as:  ROXICET  Take 1 tablet by mouth every 4 (four) hours as needed.     PARoxetine 20 MG tablet  Commonly known as:  PAXIL  Take 40 mg by mouth daily.     rosuvastatin 20 MG tablet  Commonly known as:  CRESTOR  Take 20 mg by mouth daily.     traZODone 100 MG tablet  Commonly known as:  DESYREL  Take 200 mg by mouth at bedtime.        Diagnostic Studies: Dg Chest 2 View  09/22/2015  CLINICAL DATA:  Preop for total right knee replacement EXAM: CHEST  2 VIEW COMPARISON:  07/25/2014 FINDINGS: Cardiomediastinal silhouette is unremarkable. No acute infiltrate or pleural effusion. No pulmonary edema. Mild degenerative change thoracic spine. IMPRESSION: No active cardiopulmonary disease. Degenerative changes thoracic spine. Electronically Signed   By: Natasha Mead M.D.   On: 09/22/2015 14:28    Disposition: Final discharge disposition not confirmed      Discharge Instructions    CPM    Complete by:  As directed   Continuous passive motion machine (CPM):      Use the CPM from 0 to 60  for 5 hours per day.      You may increase by 10 degrees per day.  You may break it up into 2 or 3 sessions per day.      Use CPM for 2 weeks or until you are told to stop.     Call MD / Call 911    Complete by:  As directed   If you experience chest pain or shortness of breath, CALL 911 and be transported to the hospital emergency room.  If you develope a fever above 101 F, pus (white drainage) or increased drainage or redness at the wound, or calf pain, call your surgeon's office.     Change dressing    Complete by:  As directed   Change dressing on 5, then change the dressing daily with sterile 4 x 4 inch gauze dressing and apply TED hose.  You may clean the incision with alcohol prior to redressing.     Constipation Prevention    Complete  by:  As directed   Drink plenty of fluids.  Prune juice may be helpful.  You may use  a stool softener, such as Colace (over the counter) 100 mg twice a day.  Use MiraLax (over the counter) for constipation as needed.     Diet - low sodium heart healthy    Complete by:  As directed      Driving restrictions    Complete by:  As directed   No driving for 2 weeks     Increase activity slowly as tolerated    Complete by:  As directed      Patient may shower    Complete by:  As directed   You may shower without a dressing once there is no drainage.  Do not wash over the wound.  If drainage remains, cover wound with plastic wrap and then shower.           Follow-up Information    Follow up with [name], MD In 2 weeks.   Specialty:  Orthopedic Surgery   Contact information:   [address] [place] [address] [phone]       Follow up with [facility].   Specialty:  Home Health Services   Why:  Someone from [facility] will contact you concerning start date and time for therapy.   Contact information:   [employer] [address] [place] [address] [phone]        Signed: Vear Clock, Braileigh Landenberger R 10/06/2015, 10:22 AM

## 2015-10-06 NOTE — Progress Notes (Signed)
Discharge instructions given. Pt verbalized understanding and all questions were answered.  

## 2015-10-06 NOTE — Progress Notes (Signed)
Physical Therapy Treatment Patient Details Name: Kyle [name] MRN: [id number] DOB: June 12, 1961 Today's Date: 10/06/2015    History of Present Illness 55 yo admitted for Right TKA. PMHx: obesity, HTN, DM, CAD    PT Comments    Patient reporting that he is planning to go home later today. He was able to increase his ambulation distance to 55 feet with min guard assist, using rw. Pattern remains slow but no loss of balance and 3 standing breaks taken. Patient and spouse report that this distance is further than he would have to ambulate to enter his home. They deny having any steps to enter their home as well. Both the patient and spouse confirm that they feel like they will be able to D/C to home later today. PT to continue to follow.   Follow Up Recommendations  Home health PT;SNF;Supervision/Assistance - 24 hour     Equipment Recommendations  None recommended by PT    Recommendations for Other Services       Precautions / Restrictions Precautions Precautions: Knee;Fall Restrictions Weight Bearing Restrictions: Yes RLE Weight Bearing: Weight bearing as tolerated    Mobility  Bed Mobility Overal bed mobility: Needs Assistance Bed Mobility: Supine to Sit     Supine to sit: Mod assist     General bed mobility comments: mod assist at trunk and initial moving of Rt LE to edge of bed  Transfers Overall transfer level: Needs assistance Equipment used: Rolling walker (2 wheeled) Transfers: Sit to/from Stand Sit to Stand: Min assist         General transfer comment: cues for hand placement   Ambulation/Gait Ambulation/Gait assistance: Min guard Ambulation Distance (Feet): 55 Feet Assistive device: Rolling walker (2 wheeled) Gait Pattern/deviations: Step-to pattern;Decreased step length - right;Decreased step length - left Gait velocity: decreased   General Gait Details: cues for progressing weight bearing through Rt LE.    Stairs Stairs:  (reports not having any  stairs at home)          Wheelchair Mobility    Modified Rankin (Stroke Patients Only)       Balance Overall balance assessment: Needs assistance Sitting-balance support: No upper extremity supported Sitting balance-Leahy Scale: Good     Standing balance support: Bilateral upper extremity supported Standing balance-Leahy Scale: Poor Standing balance comment: using rw                    Cognition Arousal/Alertness: Awake/alert Behavior During Therapy: WFL for tasks assessed/performed;Anxious Overall Cognitive Status: Within Functional Limits for tasks assessed                      Exercises Total Joint Exercises Ankle Circles/Pumps: AROM;Both;15 reps Quad Sets: Strengthening;Both;10 reps Heel Slides: Right;AAROM;10 reps Straight Leg Raises: Strengthening;Right;10 reps;Other (comment) (max assist) Goniometric ROM: 6-70    General Comments General comments (skin integrity, edema, etc.): SpO2 in bed 87-91%, during ambulation 92%, following session and sitting in chair 94%.       Pertinent Vitals/Pain Pain Assessment: Faces Faces Pain Scale: Hurts even more Pain Location: rt knee Pain Descriptors / Indicators: Grimacing;Guarding Pain Intervention(s): Limited activity within patient's tolerance;Monitored during session    Home Living                      Prior Function            PT Goals (current goals can now be found in the care plan section) Acute Rehab PT Goals Patient  Stated Goal: go home today PT Goal Formulation: With patient Time For Goal Achievement: 10/12/15 Potential to Achieve Goals: Fair Progress towards PT goals: Progressing toward goals    Frequency  7X/week    PT Plan Current plan remains appropriate    Co-evaluation             End of Session Equipment Utilized During Treatment: Gait belt Activity Tolerance: Patient tolerated treatment well Patient left: in chair;with call bell/phone within reach;with  family/visitor present;Other (comment) (in knee extension)     Time: 7829-5621 PT Time Calculation (min) (ACUTE ONLY): 46 min  Charges:  $Gait Training: 8-22 mins $Therapeutic Exercise: 23-37 mins                    G Codes:      Christiane Ha, PT, CSCS Pager [phone] Office [phone]  10/06/2015, 12:38 PM

## 2015-10-20 DIAGNOSIS — A419 Sepsis, unspecified organism: Secondary | ICD-10-CM

## 2015-10-20 DIAGNOSIS — Z96651 Presence of right artificial knee joint: Secondary | ICD-10-CM | POA: Insufficient documentation

## 2015-10-20 HISTORY — DX: Sepsis, unspecified organism: A41.9

## 2016-08-29 DIAGNOSIS — J189 Pneumonia, unspecified organism: Secondary | ICD-10-CM | POA: Insufficient documentation

## 2016-08-29 HISTORY — DX: Pneumonia, unspecified organism: J18.9

## 2016-08-30 ENCOUNTER — Other Ambulatory Visit: Payer: Self-pay | Admitting: Orthopedic Surgery

## 2016-09-03 ENCOUNTER — Encounter (HOSPITAL_COMMUNITY)
Admission: RE | Admit: 2016-09-03 | Discharge: 2016-09-03 | Disposition: A | Discharge status: Home / Self Care | Payer: Managed Care, Other (non HMO) | Source: Ambulatory Visit | Attending: Orthopedic Surgery | Admitting: Orthopedic Surgery

## 2016-09-03 ENCOUNTER — Encounter (HOSPITAL_COMMUNITY): Payer: Self-pay

## 2016-09-03 DIAGNOSIS — Z01818 Encounter for other preprocedural examination: Secondary | ICD-10-CM | POA: Diagnosis present

## 2016-09-03 DIAGNOSIS — M1712 Unilateral primary osteoarthritis, left knee: Secondary | ICD-10-CM | POA: Diagnosis not present

## 2016-09-03 HISTORY — DX: Dyspnea, unspecified: R06.00

## 2016-09-03 HISTORY — DX: Acute myocardial infarction, unspecified: I21.9

## 2016-09-03 LAB — CBC WITH DIFFERENTIAL/PLATELET
BASOS ABS: 0.1 10*3/uL (ref 0.0–0.1)
Basophils Relative: 1 %
EOS ABS: 0.2 10*3/uL (ref 0.0–0.7)
EOS PCT: 2 %
HCT: 44.2 % (ref 39.0–52.0)
Hemoglobin: 14.6 g/dL (ref 13.0–17.0)
LYMPHS ABS: 2.1 10*3/uL (ref 0.7–4.0)
Lymphocytes Relative: 22 %
MCH: 30.2 pg (ref 26.0–34.0)
MCHC: 33 g/dL (ref 30.0–36.0)
MCV: 91.5 fL (ref 78.0–100.0)
MONO ABS: 0.8 10*3/uL (ref 0.1–1.0)
Monocytes Relative: 8 %
Neutro Abs: 6.4 10*3/uL (ref 1.7–7.7)
Neutrophils Relative %: 67 %
PLATELETS: 304 10*3/uL (ref 150–400)
RBC: 4.83 MIL/uL (ref 4.22–5.81)
RDW: 13.2 % (ref 11.5–15.5)
WBC: 9.5 10*3/uL (ref 4.0–10.5)

## 2016-09-03 LAB — BASIC METABOLIC PANEL
Anion gap: 12 (ref 5–15)
BUN: 9 mg/dL (ref 6–20)
CO2: 24 mmol/L (ref 22–32)
CREATININE: 0.85 mg/dL (ref 0.61–1.24)
Calcium: 9.8 mg/dL (ref 8.9–10.3)
Chloride: 100 mmol/L — ABNORMAL LOW (ref 101–111)
GFR calc Af Amer: >60 mL/min (ref >60)
GFR calc non Af Amer: >60 mL/min (ref >60)
GLUCOSE: 215 mg/dL — AB (ref 65–99)
Potassium: 4.1 mmol/L (ref 3.5–5.1)
SODIUM: 136 mmol/L (ref 135–145)

## 2016-09-03 LAB — URINALYSIS, ROUTINE W REFLEX MICROSCOPIC
BILIRUBIN URINE: NEGATIVE
HGB URINE DIPSTICK: NEGATIVE
KETONES UR: NEGATIVE mg/dL
LEUKOCYTES UA: NEGATIVE
NITRITE: NEGATIVE
PROTEIN: NEGATIVE mg/dL
Specific Gravity, Urine: 1.024 (ref 1.005–1.030)
pH: 5 (ref 5.0–8.0)

## 2016-09-03 LAB — PROTIME-INR
INR: 0.97
PROTHROMBIN TIME: 12.8 s (ref 11.4–15.2)

## 2016-09-03 LAB — TYPE AND SCREEN
ABO/RH(D): O POS
Antibody Screen: NEGATIVE

## 2016-09-03 LAB — SURGICAL PCR SCREEN
MRSA, PCR: POSITIVE — AB
Staphylococcus aureus: POSITIVE — AB

## 2016-09-03 LAB — GLUCOSE, CAPILLARY: GLUCOSE-CAPILLARY: 261 mg/dL — AB (ref 65–99)

## 2016-09-03 LAB — APTT: APTT: 30 s (ref 24–36)

## 2016-09-03 NOTE — Progress Notes (Signed)
I called a prescription for Mupirocin ointment to [facility], [address] [place], [place]

## 2016-09-03 NOTE — Progress Notes (Signed)
REQUESTED CARDIAC CATH, LAST OFFICE NOTE, EKG, ?ECHO FROM DR. RAVENKAR [phone] [facility] CARDIOLOGY)   PCP- Jacki Cones [facility] [place]

## 2016-09-03 NOTE — Pre-Procedure Instructions (Signed)
Kahron Kauth  09/03/2016      [facility] - [place], [place] - [address] [place] [address] Phone: [phone] Fax: [phone]  [id number] - [place], [place] - [address] [facility] [address] [place] [address] Phone: [phone] Fax: [phone]    Your procedure is scheduled on   Wednesday  09/11/16  Report to [facility] Admitting at 1045 A.M.  Call this number if you have problems the morning of surgery:  [phone]   Remember:  Do not eat food or drink liquids after midnight.  Take these medicines the morning of surgery with A SIP OF WATER  AMLODIPINE (NORVASC), HYDROCODONE IF NEEDED, METOPROLOL (TOPROL), OMEPRAZOLE (PRILOSEC), PAROXETINE (PAXIL)      (STOP 7 DAYS PRIOR TO SURGERY- ASPIRIN OR ASPIRIN PRODUCTS, IBUPROFEN/ ADVIL/ MOTRIN, GOODY POWDERS, BC'S, MELOXICAM/ MOBIC, HERBAL MEDICINES)    How to Manage Your Diabetes Before and After Surgery  Why is it important to control my blood sugar before and after surgery? . Improving blood sugar levels before and after surgery helps healing and can limit problems. . A way of improving blood sugar control is eating a healthy diet by: o  Eating less sugar and carbohydrates o  Increasing activity/exercise o  Talking with your doctor about reaching your blood sugar goals . High blood sugars (greater than 180 mg/dL) can raise your risk of infections and slow your recovery, so you will need to focus on controlling your diabetes during the weeks before surgery. . Make sure that the doctor who takes care of your diabetes knows about your planned surgery including the date and location.  How do I manage my blood sugar before surgery? . Check your blood sugar at least 4 times a day, starting 2 days before surgery, to make sure that the level is not too high or low. o Check your blood sugar the morning of your surgery when you wake up and every 2 hours until you  get to the Short Stay unit. . If your blood sugar is less than 70 mg/dL, you will need to treat for low blood sugar: o Do not take insulin. o Treat a low blood sugar (less than 70 mg/dL) with  cup of clear juice (cranberry or apple), 4 glucose tablets, OR glucose gel. o Recheck blood sugar in 15 minutes after treatment (to make sure it is greater than 70 mg/dL). If your blood sugar is not greater than 70 mg/dL on recheck, call [phone] for further instructions. . Report your blood sugar to the short stay nurse when you get to Short Stay.  . If you are admitted to the hospital after surgery: o Your blood sugar will be checked by the staff and you will probably be given insulin after surgery (instead of oral diabetes medicines) to make sure you have good blood sugar levels. o The goal for blood sugar control after surgery is 80-180 mg/dL.              WHAT DO I DO ABOUT MY DIABETES MEDICATION?   Marland Kitchen Do not take oral diabetes medicines (pills) the morning of surgery.  . THE NIGHT BEFORE SURGERY, take ___________ units of ___________insulin.       Marland Kitchen HE MORNING OF SURGERY, take _____________ units of __________insulin.  . The day of surgery, do not take other diabetes injectables, including Byetta (exenatide), Bydureon (exenatide ER), Victoza (liraglutide), or Trulicity (dulaglutide).  Marland Kitchen  If your CBG is greater than 220 mg/dL, you may take  of your sliding scale (correction) dose of insulin.  Other Instructions:          Patient Signature:  Date:   Nurse Signature:  Date:   Reviewed and Endorsed by [facility] Patient Education Committee, August 2015  Do not wear jewelry, make-up or nail polish.  Do not wear lotions, powders, or perfumes, or deoderant.  Do not shave 48 hours prior to surgery.  Men may shave face and neck.  Do not bring valuables to the hospital.  [facility] is not responsible for any belongings or valuables.  Contacts, dentures or bridgework may  not be worn into surgery.  Leave your suitcase in the car.  After surgery it may be brought to your room.  For patients admitted to the hospital, discharge time will be determined by your treatment team.  Patients discharged the day of surgery will not be allowed to drive home.   Name and phone number of your driver:    Special instructions:  [facility] - Preparing for Surgery  Before surgery, you can play an important role.  Because skin is not sterile, your skin needs to be as free of germs as possible.  You can reduce the number of germs on you skin by washing with CHG (chlorahexidine gluconate) soap before surgery.  CHG is an antiseptic cleaner which kills germs and bonds with the skin to continue killing germs even after washing.  Please DO NOT use if you have an allergy to CHG or antibacterial soaps.  If your skin becomes reddened/irritated stop using the CHG and inform your nurse when you arrive at Short Stay.  Do not shave (including legs and underarms) for at least 48 hours prior to the first CHG shower.  You may shave your face.  Please follow these instructions carefully:   1.  Shower with CHG Soap the night before surgery and the                                morning of Surgery.  2.  If you choose to wash your hair, wash your hair first as usual with your       normal shampoo.  3.  After you shampoo, rinse your hair and body thoroughly to remove the                      Shampoo.  4.  Use CHG as you would any other liquid soap.  You can apply chg directly       to the skin and wash gently with scrungie or a clean washcloth.  5.  Apply the CHG Soap to your body ONLY FROM THE NECK DOWN.        Do not use on open wounds or open sores.  Avoid contact with your eyes,       ears, mouth and genitals (private parts).  Wash genitals (private parts)       with your normal soap.  6.  Wash thoroughly, paying special attention to the area where your surgery        will be performed.  7.   Thoroughly rinse your body with warm water from the neck down.  8.  DO NOT shower/wash with your normal soap after using and rinsing off       the CHG Soap.  9.  Dennie Bible  yourself dry with a clean towel.            10.  Wear clean pajamas.            11.  Place clean sheets on your bed the night of your first shower and do not        sleep with pets.  Day of Surgery  Do not apply any lotions/deoderants the morning of surgery.  Please wear clean clothes to the hospital/surgery center.    Please read over the following fact sheets that you were given. MRSA Information and Surgical Site Infection Prevention

## 2016-09-03 NOTE — Progress Notes (Signed)
   09/03/16 1544  OBSTRUCTIVE SLEEP APNEA  Have you ever been diagnosed with sleep apnea through a sleep study? No  Do you snore loudly (loud enough to be heard through closed doors)?  1  Do you often feel tired, fatigued, or sleepy during the daytime (such as falling asleep during driving or talking to someone)? 0  Has anyone observed you stop breathing during your sleep? 0  Do you have, or are you being treated for high blood pressure? 1  BMI more than 35 kg/m2? 1  Age > 50 (1-yes) 1  Neck circumference greater than:Male 16 inches or larger, Male 17inches or larger? 1  Male Gender (Yes=1) 1  Obstructive Sleep Apnea Score 6  Score 5 or greater  Results sent to PCP

## 2016-09-04 LAB — HEMOGLOBIN A1C
Hgb A1c MFr Bld: 8.8 % — ABNORMAL HIGH (ref 4.8–5.6)
MEAN PLASMA GLUCOSE: 206 mg/dL

## 2016-09-04 NOTE — Progress Notes (Signed)
Anesthesia chart review: Patient is a 56 year old male scheduled for left TKA on 09/11/2016 by Dr. Turner Daniels.  History includes non-smoker, CAD (minimal by 2012 cath), HTN, DM2, depression, GERD, HLD, psoriasis, cholecystectomy, dyspnea on exertion, right TKA 10/04/15 (GETA, glidescope; Patient refused spinal and also femoral nerve block at that time). He reported prior chest CT for lung nodule in the past (see 06/2014 chest CT report below). BMI is consistent with obesity. OSA screening score was 6.   PCP is Dr. Mikael Spray at [facility] in [place].  He is not routinely followed by a cardiologist, but was evaluated by Dr. Bonnielee Haff (previously with [facility], which is now [facility]) in 2014 for SOB. Echo ordered (see below) with planned PRN cardiology follow-up afterwards.   Meds include amlodipine, ASA 325 mg (on hold), Bybureon, Lasix, Neurontin, glipizide, Norco, lisinopril, metformin, Robaxin, Toprol XL, Nitro, Prilosec, Paxil, trazodone.   BP 122/73   Pulse (!) 114 Comment: notified Designer, jewellery  Temp 36.8 C   Resp 20   Ht 5\' 8"  (1.727 m)   Wt 241 lb 9.6 oz (109.6 kg)   SpO2 96%   BMI 36.74 kg/m  Unfortunately, HR was not rechecked which he was at PAT. I called and spoke with him. His son's girlfriend is a CNA and can monitor his HR intermittently over the next few days. I asked him to call me if his HR is staying > 100 bpm. (He said that the 114 HR was taken just after walking into PAT and was also having left knee pain.). He denied CP, SOB, edema (controlled with Lasix).  09/22/15 EKG: NSR.  06/10/14 Nuclear stress test [facility]; radiology portion printed from PACS): Well tolerated Lexiscan perfusion stress test, with no clinical ischemia during vasodilator stress. Perfusion scan report pending per radiology=>Impression:  1. No reversible ischemia or infarction. 2. Normal LV wall motion. 3. LVEF 58%. 4. Low risk stress test findings.    11/23/12 Echo (Dr. Wille Glaser; done at [facility]): FINDINGS: 1. Left ventricle: Normal size. Normal systolic function. Abnormal diastolic function (relaxation abnormalities). 2. Right ventricle: Normal size and function. 3. Left atrium: Normal. 4. Right atrium: Normal. 5. Aortic valve: Tricuspid. No stenosis. Velocities across the valve: 1 49 cm/s. No regurgitation. 6. Mitral valve: Normal. No stenosis. No regurgitation. 7. Tricuspid valve: Normal. No stenosis. Trivial tricuspid regurgitation. 8. Pulmonary valve: Normal. No stenosis. No regurgitation. 9. Intact intra-atrial septum. No pericardial effusion. No intracardiac masses seen. Normal aortic root. Pulmonary artery normal. 10. Rhythm: Sinus tachycardia.  Conclusions: Quality of study: Technically difficult. Normal LVF. Trace tricuspid regurgitation.  03/28/11 LHC (Dr. Cathlean Cower, [facility]): Conclusions: Trivial non-obstructive CAD (normal LM, LAD; 10% mid CX and mid RCA). Normal LVF. LVEF 65%. Recommend work-up for non-cardiac causes of symptoms. CAD risk factor reduction.    10/20/15 CXR [facility]; Care Everywhere): IMPRESSION: No active cardiopulmonary disease.   07/25/14 Chest CT w/o CM (report printed from PACS): Impression: 1. Resolution of previously seen airspace disease compatible with either treated pneumonia or result pulmonary edema. 2. Old granulomatous disease of the left lung. 3. Probable pleuroparenchymal scarring and atelectasis in the lateral right lower lobe extending to the right costophrenic angle, which was not convincingly present on prior studies. Follow-up 6 month noncontrast CT recommended to assess for interval changes. If this area stable or resolved, follow-up can be discontinued. Preoperative labs noted. A1c 8.5.   Preoperative labs noted. A1c 8.8, consistent with average glucose of 206. He reports fasting  CBG 145 and that these stay < 200. He reports compliance with his DM  regimen.  I called and left a voice message with Olegario Messier at Dr. Wadie Lessen office regarding A1c result. He will get a fasting CBG on arrival. He is suppose to follow-up with me regarding home heart rates. If his HR is staying > 100, then I would be inclined to repeat his EKG prior to surgery. He will get vitals on arrival too.   Velna Ochs [facility] Short Stay Center/Anesthesiology Phone [phone] 09/04/2016 4:33 PM

## 2016-09-06 DIAGNOSIS — M1712 Unilateral primary osteoarthritis, left knee: Secondary | ICD-10-CM

## 2016-09-06 HISTORY — DX: Unilateral primary osteoarthritis, left knee: M17.12

## 2016-09-08 DIAGNOSIS — R6889 Other general symptoms and signs: Secondary | ICD-10-CM | POA: Diagnosis not present

## 2016-09-08 DIAGNOSIS — A419 Sepsis, unspecified organism: Secondary | ICD-10-CM | POA: Diagnosis not present

## 2016-09-08 DIAGNOSIS — J189 Pneumonia, unspecified organism: Secondary | ICD-10-CM

## 2016-09-08 DIAGNOSIS — J9601 Acute respiratory failure with hypoxia: Secondary | ICD-10-CM | POA: Diagnosis not present

## 2016-09-08 DIAGNOSIS — E119 Type 2 diabetes mellitus without complications: Secondary | ICD-10-CM | POA: Diagnosis not present

## 2016-09-08 DIAGNOSIS — E872 Acidosis: Secondary | ICD-10-CM | POA: Diagnosis not present

## 2016-09-09 DIAGNOSIS — E872 Acidosis: Secondary | ICD-10-CM | POA: Diagnosis not present

## 2016-09-09 DIAGNOSIS — A419 Sepsis, unspecified organism: Secondary | ICD-10-CM | POA: Diagnosis not present

## 2016-09-09 DIAGNOSIS — J189 Pneumonia, unspecified organism: Secondary | ICD-10-CM | POA: Diagnosis not present

## 2016-09-09 DIAGNOSIS — J9601 Acute respiratory failure with hypoxia: Secondary | ICD-10-CM | POA: Diagnosis not present

## 2016-10-04 ENCOUNTER — Encounter (HOSPITAL_COMMUNITY)
Admission: RE | Admit: 2016-10-04 | Discharge: 2016-10-04 | Disposition: A | Discharge status: Home / Self Care | Payer: 59 | Source: Ambulatory Visit | Attending: Orthopedic Surgery | Admitting: Orthopedic Surgery

## 2016-10-04 ENCOUNTER — Other Ambulatory Visit: Payer: Self-pay | Admitting: Orthopedic Surgery

## 2016-10-04 ENCOUNTER — Encounter (HOSPITAL_COMMUNITY): Payer: Self-pay

## 2016-10-04 DIAGNOSIS — E669 Obesity, unspecified: Secondary | ICD-10-CM | POA: Insufficient documentation

## 2016-10-04 DIAGNOSIS — Z01818 Encounter for other preprocedural examination: Secondary | ICD-10-CM | POA: Diagnosis present

## 2016-10-04 DIAGNOSIS — K219 Gastro-esophageal reflux disease without esophagitis: Secondary | ICD-10-CM | POA: Insufficient documentation

## 2016-10-04 DIAGNOSIS — E785 Hyperlipidemia, unspecified: Secondary | ICD-10-CM | POA: Insufficient documentation

## 2016-10-04 DIAGNOSIS — I10 Essential (primary) hypertension: Secondary | ICD-10-CM | POA: Diagnosis not present

## 2016-10-04 DIAGNOSIS — E119 Type 2 diabetes mellitus without complications: Secondary | ICD-10-CM | POA: Diagnosis not present

## 2016-10-04 DIAGNOSIS — Z6837 Body mass index (BMI) 37.0-37.9, adult: Secondary | ICD-10-CM | POA: Diagnosis not present

## 2016-10-04 HISTORY — DX: Pneumonia, unspecified organism: J18.9

## 2016-10-04 LAB — TYPE AND SCREEN
ABO/RH(D): O POS
Antibody Screen: NEGATIVE

## 2016-10-04 LAB — CBC WITH DIFFERENTIAL/PLATELET
BASOS ABS: 0 10*3/uL (ref 0.0–0.1)
Basophils Relative: 0 %
EOS ABS: 0.4 10*3/uL (ref 0.0–0.7)
EOS PCT: 6 %
HCT: 42.8 % (ref 39.0–52.0)
Hemoglobin: 13.7 g/dL (ref 13.0–17.0)
Lymphocytes Relative: 21 %
Lymphs Abs: 1.6 10*3/uL (ref 0.7–4.0)
MCH: 29.7 pg (ref 26.0–34.0)
MCHC: 32 g/dL (ref 30.0–36.0)
MCV: 92.6 fL (ref 78.0–100.0)
MONO ABS: 0.5 10*3/uL (ref 0.1–1.0)
Monocytes Relative: 7 %
Neutro Abs: 5.2 10*3/uL (ref 1.7–7.7)
Neutrophils Relative %: 66 %
PLATELETS: 240 10*3/uL (ref 150–400)
RBC: 4.62 MIL/uL (ref 4.22–5.81)
RDW: 13.3 % (ref 11.5–15.5)
WBC: 7.8 10*3/uL (ref 4.0–10.5)

## 2016-10-04 LAB — URINALYSIS, ROUTINE W REFLEX MICROSCOPIC
Bacteria, UA: NONE SEEN
Bilirubin Urine: NEGATIVE
GLUCOSE, UA: NEGATIVE mg/dL
HGB URINE DIPSTICK: NEGATIVE
Ketones, ur: NEGATIVE mg/dL
Nitrite: NEGATIVE
PH: 7 (ref 5.0–8.0)
Protein, ur: NEGATIVE mg/dL
Specific Gravity, Urine: 1.015 (ref 1.005–1.030)

## 2016-10-04 LAB — BASIC METABOLIC PANEL
ANION GAP: 10 (ref 5–15)
BUN: 7 mg/dL (ref 6–20)
CALCIUM: 9.3 mg/dL (ref 8.9–10.3)
CO2: 25 mmol/L (ref 22–32)
Chloride: 101 mmol/L (ref 101–111)
Creatinine, Ser: 0.82 mg/dL (ref 0.61–1.24)
GFR calc Af Amer: >60 mL/min (ref >60)
GLUCOSE: 151 mg/dL — AB (ref 65–99)
POTASSIUM: 4 mmol/L (ref 3.5–5.1)
SODIUM: 136 mmol/L (ref 135–145)

## 2016-10-04 LAB — GLUCOSE, CAPILLARY: GLUCOSE-CAPILLARY: 147 mg/dL — AB (ref 65–99)

## 2016-10-04 LAB — PROTIME-INR
INR: 1.01
Prothrombin Time: 13.3 seconds (ref 11.4–15.2)

## 2016-10-04 LAB — APTT: APTT: 30 s (ref 24–36)

## 2016-10-04 NOTE — Progress Notes (Addendum)
Patient here 2/18 for preadmit/surgery postponed. patient hosp for pneumonia. Pos mrsa/staph on swab done at preop . Did not use rx until recently and only infreguently. inst to start tonite and use for 10 doses and d/c. cxr req'd from pcp.-started on new insulin 10/03/16 No orders- office called and rec'd verbal lab orders- to put other orders in for dr Turner Daniels to sign.

## 2016-10-04 NOTE — Pre-Procedure Instructions (Addendum)
Haines Morales  10/04/2016      [facility] - [place], [place] - [address] [place] [address] Phone: [phone] Fax: [phone]  [id number] - [place], [place] - [address] AT [facility] [address] [place] [address] Phone: [phone] Fax: [phone]  [id number] - [place], [place] - [address] [place] [address] Phone: [phone] Fax: [phone]    Your procedure is scheduled on 10/14/16.  Report to [facility] Admitting at 1050 A.M.  Call this number if you have problems the morning of surgery:  [phone]   Remember:  Do not eat food or drink liquids after midnight.  Take these medicines the morning of surgery with A SIP OF WATER     Amlodipine,gabapentin, metoprolol, omeprazole,paxal,hydrocodone if needed, nitro if needed  STOP all herbel meds, nsaids (aleve,naproxen,advil,ibuprofen)  Starting 10/07/16 including all vitamins/supplements, aspirin  Do not take metformin, glipizide am of surgery  No bydureon day of surgery--may take  Days before surgery when needed     How to Manage Your Diabetes Before and After Surgery  Why is it important to control my blood sugar before and after surgery? . Improving blood sugar levels before and after surgery helps healing and can limit problems. . A way of improving blood sugar control is eating a healthy diet by: o  Eating less sugar and carbohydrates o  Increasing activity/exercise o  Talking with your doctor about reaching your blood sugar goals . High blood sugars (greater than 180 mg/dL) can raise your risk of infections and slow your recovery, so you will need to focus on controlling your diabetes during the weeks before surgery. . Make sure that the doctor who takes care of your diabetes knows about your planned surgery including the date and location.  How do I manage my blood sugar before surgery? . Check  your blood sugar at least 4 times a day, starting 2 days before surgery, to make sure that the level is not too high or low. o Check your blood sugar the morning of your surgery when you wake up and every 2 hours until you get to the Short Stay unit. . If your blood sugar is less than 70 mg/dL, you will need to treat for low blood sugar: o Do not take insulin. o Treat a low blood sugar (less than 70 mg/dL) with  cup of clear juice (cranberry or apple), 4 glucose tablets, OR glucose gel. o Recheck blood sugar in 15 minutes after treatment (to make sure it is greater than 70 mg/dL). If your blood sugar is not greater than 70 mg/dL on recheck, call [phone] for further instructions. . Report your blood sugar to the short stay nurse when you get to Short Stay.  . If you are admitted to the hospital after surgery: o Your blood sugar will be checked by the staff and you will probably be given insulin after surgery (instead of oral diabetes medicines) to make sure you have good blood sugar levels. o The goal for blood sugar control after surgery is 80-180 mg/dL.  WHAT DO I DO ABOUT MY DIABETES MEDICATION?   Marland Kitchen Do not take oral diabetes medicines (pills) the morning of surgery.(no metformin,glipizide pills or bydureon injectable am of surgery)  . THE Day BEFORE SURGERY,may take bydureon  injectable if needed  Morning of  surgery take 8 units basaglar insulin  Do not wear jewelry, make-up or nail polish.  Do not wear lotions, powders, or perfumes, or deoderant.  Do not shave 48 hours prior to surgery.  Men may shave face and neck.  Do not bring valuables to the hospital.  [facility] is not responsible for any belongings or valuables.  Contacts, dentures or bridgework may not be worn into surgery.  Leave your suitcase in the car.  After surgery it may be brought to your room.  For patients admitted to the hospital, discharge time will be determined by your treatment team.  Patients  discharged the day of surgery will not be allowed to drive home.   Special instructions:  Special Instructions: [place] - Preparing for Surgery  Before surgery, you can play an important role.  Because skin is not sterile, your skin needs to be as free of germs as possible.  You can reduce the number of germs on you skin by washing with CHG (chlorahexidine gluconate) soap before surgery.  CHG is an antiseptic cleaner which kills germs and bonds with the skin to continue killing germs even after washing.  Please DO NOT use if you have an allergy to CHG or antibacterial soaps.  If your skin becomes reddened/irritated stop using the CHG and inform your nurse when you arrive at Short Stay.  Do not shave (including legs and underarms) for at least 48 hours prior to the first CHG shower.  You may shave your face.  Please follow these instructions carefully:   1.  Shower with CHG Soap the night before surgery and the morning of Surgery.  2.  If you choose to wash your hair, wash your hair first as usual with your normal shampoo.  3.  After you shampoo, rinse your hair and body thoroughly to remove the Shampoo.  4.  Use CHG as you would any other liquid soap.  You can apply chg directly  to the skin and wash gently with scrungie or a clean washcloth.  5.  Apply the CHG Soap to your body ONLY FROM THE NECK DOWN.  Do not use on open wounds or open sores.  Avoid contact with your eyes ears, mouth and genitals (private parts).  Wash genitals (private parts)       with your normal soap.  6.  Wash thoroughly, paying special attention to the area where your surgery will be performed.  7.  Thoroughly rinse your body with warm water from the neck down.  8.  DO NOT shower/wash with your normal soap after using and rinsing off the CHG Soap.  9.  Pat yourself dry with a clean towel.            10.  Wear clean pajamas.            11.  Place clean sheets on your bed the night of your first shower and do not  sleep with pets.  Day of Surgery  Do not apply any lotions/deodorants the morning of surgery.  Please wear clean clothes to the hospital/surgery center.  Please read over the fact sheets that you were given.

## 2016-10-07 ENCOUNTER — Encounter (HOSPITAL_COMMUNITY): Payer: Self-pay

## 2016-10-07 NOTE — Progress Notes (Signed)
Anesthesia chart review: Patient is a 56 year old male scheduled for left TKA on 10/14/2016 09/11/2016 by Dr. Turner Daniels. Procedure was initially scheduled for 09/11/2016, but was postponed due to pneumonia.    History includes non-smoker, CAD (minimal by 2012 cath), HTN, DM2, depression, GERD, HLD, psoriasis, cholecystectomy, dyspnea on exertion, right TKA 10/04/15 (GETA, glidescope; Patient refused spinal and also femoral nerve block at that time). He reported prior chest CT for lung nodule in the past (see 06/2014 chest CT report below). BMI is consistent with obesity. OSA screening score was 6.   PCP is Dr. Mikael Spray at [facility] in [place].  He is not routinely followed by a cardiologist, but was evaluated by Dr. Bonnielee Haff (previously with [facility], which is now [facility]) in 2014 for SOB. Echo ordered (see below) with planned PRN cardiology follow-up afterwards.   Meds include amlodipine, ASA 325 mg (on hold starting 10/07/16), Bybureon, Lasix, Neurontin, glipizide, Norco, insulin glargine (Basaglar), lisinopril, metformin, Robaxin, Toprol XL, Nitro, Prilosec, Paxil, Crestor, trazodone. He reported being started on insulin 10/03/16.   BP 139/68   Pulse 74   Temp 36.8 C   Resp 20   Ht 5\' 8"  (1.727 m)   Wt 249 lb 9.6 oz (113.2 kg)   SpO2 97%   BMI 37.95 kg/m   EKG 09/08/16 [facility] during evaluation for pneumonia): ST at 131 bpm. (HR was 74 bpm at PAT.)   Nuclear stress test 06/10/14 [facility]; radiology portion printed from PACS): Well tolerated Lexiscan perfusion stress test, with no clinical ischemia during vasodilator stress. Perfusion scan report pending per radiology=>Impression:  1. No reversible ischemia or infarction. 2. Normal LV wall motion. 3. LVEF 58%. 4. Low risk stress test findings.   Echo 11/23/12 (Dr. Wille Glaser; done at [facility]): FINDINGS: 1. Left ventricle: Normal size. Normal systolic function. Abnormal  diastolic function (relaxation abnormalities). 2. Right ventricle: Normal size and function. 3. Left atrium: Normal. 4. Right atrium: Normal. 5. Aortic valve: Tricuspid. No stenosis. Velocities across the valve: 1 49 cm/s. No regurgitation. 6. Mitral valve: Normal. No stenosis. No regurgitation. 7. Tricuspid valve: Normal. No stenosis. Trivial tricuspid regurgitation. 8. Pulmonary valve: Normal. No stenosis. No regurgitation. 9. Intact intra-atrial septum. No pericardial effusion. No intracardiac masses seen. Normal aortic root. Pulmonary artery normal. 10. Rhythm: Sinus tachycardia.  Conclusions: Quality of study: Technically difficult. Normal LVF. Trace tricuspid regurgitation.  LHC 03/28/11 (Dr. Cathlean Cower, [facility]): Conclusions: Trivial non-obstructive CAD (normal LM, LAD; 10% mid CX and mid RCA). Normal LVF. LVEF 65%. Recommend work-up for non-cardiac causes of symptoms. CAD risk factor reduction.   2V CXR 09/08/16 [facility] during evaluation for pneumonia): FINDINGS: Peribronchial thickening. Patchy bilateral interstitial and airspace opacities concerning for pneumonia. Heart is normal size. No effusions. No acute bony abnormalities. IMPRESSION: Bronchitic changes. Patchy bilateral interstitial and alveolar opacities concerning for pneumonia.   Chest CT w/o CM 07/25/14 (report printed from PACS): Impression: 1. Resolution of previously seen airspace disease compatible with either treated pneumonia or result pulmonary edema. 2. Old granulomatous disease of the left lung. 3. Probable pleuroparenchymal scarring and atelectasis in the lateral right lower lobe extending to the right costophrenic angle, which was not convincingly present on prior studies. Follow-up 6 month noncontrast CT recommended to assess for interval changes. If this area stable or resolved, follow-up can be discontinued.  Preoperative labs noted. Cr 0.82. CBC, PT/PTT WNL. UA trace leukocytes,  negative nitrites. Glucose 151. A1c 8.8 on 09/03/16, consistent with average glucose  of 206. This was already called to Dr. Wadie Lessen office in February. He has since been started on glargine along with his other DM agents.   Anesthesia evaluation was not requested at his PAT visit on 10/04/16, but patient is > 1 month out from treatment for pneumonia. Last A1c was > 8, but he is now on insulin. Tachycardia improved since my last note and his 09/08/16 ED visit. He will get a fasting CBG on the day of surgery. If no acute changes then I would anticipate that he can proceed as planned.  Velna Ochs [facility] Short Stay Center/Anesthesiology Phone [phone] 10/07/2016 3:46 PM

## 2016-10-11 MED ORDER — DEXTROSE-NACL 5-0.45 % IV SOLN: INTRAVENOUS | Status: DC

## 2016-10-11 MED ORDER — SODIUM CHLORIDE 0.9 % IV SOLN
1500.0000 mg | INTRAVENOUS | Status: AC
  Administered 2016-10-14 (×2): 1500 mg via INTRAVENOUS
  Filled 2016-10-11: qty 1500

## 2016-10-11 MED ORDER — SODIUM CHLORIDE 0.9 % IV SOLN
1000.0000 mg | INTRAVENOUS | Status: AC
  Administered 2016-10-14: 12:00:00 via INTRAVENOUS
  Administered 2016-10-14: 1000 mg via INTRAVENOUS
  Filled 2016-10-11: qty 10

## 2016-10-11 MED ORDER — TRANEXAMIC ACID 1000 MG/10ML IV SOLN
2000.0000 mg | INTRAVENOUS | Status: AC
  Administered 2016-10-14: 2000 mg via TOPICAL
  Filled 2016-10-11: qty 20

## 2016-10-11 MED ORDER — BUPIVACAINE LIPOSOME 1.3 % IJ SUSP
20.0000 mL | Freq: Once | INTRAMUSCULAR | Status: AC
  Administered 2016-10-14: 20 mL
  Filled 2016-10-11: qty 20

## 2016-10-11 NOTE — H&P (Signed)
TOTAL KNEE ADMISSION H&P  Patient is being admitted for left total knee arthroplasty.  Subjective:  Chief Complaint:left knee pain.  HPI: Kyle [name], 56 y.o. male, has a history of pain and functional disability in the left knee due to arthritis and has failed non-surgical conservative treatments for greater than 12 weeks to includeNSAID's and/or analgesics, corticosteriod injections, flexibility and strengthening excercises, weight reduction as appropriate and activity modification.  Onset of symptoms was gradual, starting 7 years ago with gradually worsening course since that time. The patient noted no past surgery on the left knee(s).  Patient currently rates pain in the left knee(s) at 10 out of 10 with activity. Patient has night pain, worsening of pain with activity and weight bearing, pain that interferes with activities of daily living, pain with passive range of motion, crepitus and joint swelling.  Patient has evidence of subchondral sclerosis and joint space narrowing by imaging studies.  There is no active infection.  Patient Active Problem List   Diagnosis Date Noted  . Primary osteoarthritis of left knee 09/06/2016  . Primary osteoarthritis of right knee 09/30/2015  . Morbid obesity (HCC) 08/23/2015  . Essential (primary) hypertension 08/23/2015  . Degeneration of intervertebral disc of lumbosacral region 08/23/2015  . Pain in lower limb 12/07/2013  . Plantar fasciitis of left foot 11/01/2013  . Metatarsal deformity 11/01/2013  . Diabetes (HCC) 11/01/2013  . DEHYDRATION 01/26/2009  . DYSTHYMIC DISORDER 01/26/2009  . ATHEROSLERO NATV ART EXTREM W/INTERMIT CLAUDICAT 01/26/2009  . CLAUDICATION 01/26/2009  . DYSPNEA 01/26/2009  . CHEST PAIN UNSPECIFIED 01/26/2009   Past Medical History:  Diagnosis Date  . Arthritis   . Coronary artery disease    minimal CAD '12; NL stress test 06/10/14 ([facility])  . Depression   . Diabetes mellitus without complication (HCC)   . Dyspnea    W/ EXERTION  PT STATES THEY FOUND SPOT ON LUNG SEVERAL YEARS AGO   . Family history of adverse reaction to anesthesia    "it made my son sick"  . GERD (gastroesophageal reflux disease)   . Hyperlipemia   . Hypertension   . Myocardial infarction    2012; hospitalized [facility] for chest pain syndrome 02/2011 and LHC showed trivial CAD  . Pneumonia 08/2016   surgery had to be rescheduled due to pneumonia  . Psoriasis     Past Surgical History:  Procedure Laterality Date  . CARDIAC CATHETERIZATION     03/28/11 LHC: NL LM, LAD; 10% mCX, mRCA. EF 65%. (Dr. Dot Been, [facility])  . CHOLECYSTECTOMY    . CORONARY ANGIOPLASTY     2012 [facility]   . ESOPHAGEAL DILATION     SEVERAL TIMES  . Heel Spur Resection with Fasiotomy Left 11/25/2013   @ [facility]  . KNEE ARTHROSCOPY Bilateral   . SHOULDER ARTHROSCOPY WITH OPEN ROTATOR CUFF REPAIR Right   . TOTAL KNEE ARTHROPLASTY Right 10/04/2015   Procedure: RIGHT TOTAL KNEE ARTHROPLASTY;  Surgeon: Gean Birchwood, MD;  Location: [facility] OR;  Service: Orthopedics;  Laterality: Right;  . TUMOR REMOVAL     FATTY TUMOR  RT SHOULDER    No prescriptions prior to admission.   Allergies  Allergen Reactions  . Cymbalta [Duloxetine Hcl] Other (See Comments)    "makes me crazy" depression  . Penicillins Hives and Swelling    Has patient had a PCN reaction causing immediate rash, facial/tongue/throat swelling, SOB or lightheadedness with hypotension: No Has patient had a PCN reaction causing severe rash involving mucus membranes or skin necrosis:  No Has patient had a PCN reaction that required hospitalization No Has patient had a PCN reaction occurring within the last 10 years: No If all of the above answers are "NO", then may proceed with Cephalosporin use.     Social History  Substance Use Topics  . Smoking status: Never Smoker  . Smokeless tobacco: Never Used  . Alcohol use No    No family history on file.   Review of Systems  Constitutional: Negative.   HENT:  Negative.   Eyes: Negative.   Respiratory: Negative.   Cardiovascular: Negative.   Gastrointestinal: Negative.   Genitourinary: Negative.   Musculoskeletal: Positive for joint pain.  Skin: Negative.   Neurological: Negative.   Endo/Heme/Allergies: Negative.   Psychiatric/Behavioral: Negative.     Objective:  Physical Exam  Constitutional: He is oriented to person, place, and time. He appears well-developed and well-nourished.  HENT:  Head: Normocephalic and atraumatic.  Eyes: Pupils are equal, round, and reactive to light.  Neck: Normal range of motion. Neck supple.  Cardiovascular: Intact distal pulses.   Respiratory: Effort normal.  Musculoskeletal: He exhibits tenderness.  Tender along the medial joint line of the left knee.  One plus effusion, collateral ligaments are stable, although varus stress exacerbates the pain in the left knee.  5 flexion contracture, flexes 120 with discomfort on full flexion.  Adequate quadriceps and hamstring power.  Neurological: He is alert and oriented to person, place, and time.  Skin: Skin is warm and dry.  Psychiatric: He has a normal mood and affect. His behavior is normal. Judgment and thought content normal.    Vital signs in last 24 hours:    Labs:   Estimated body mass index is 37.95 kg/m as calculated from the following:   Height as of 10/04/16: 5\' 8"  (1.727 m).   Weight as of 10/04/16: 113.2 kg (249 lb 9.6 oz).   Imaging Review Plain radiographs demonstrate end-stage arthritis of the left knee as well.  Assessment/Plan:  End stage arthritis, left knee   The patient history, physical examination, clinical judgment of the provider and imaging studies are consistent with end stage degenerative joint disease of the left knee(s) and total knee arthroplasty is deemed medically necessary. The treatment options including medical management, injection therapy arthroscopy and arthroplasty were discussed at length. The risks and  benefits of total knee arthroplasty were presented and reviewed. The risks due to aseptic loosening, infection, stiffness, patella tracking problems, thromboembolic complications and other imponderables were discussed. The patient acknowledged the explanation, agreed to proceed with the plan and consent was signed. Patient is being admitted for inpatient treatment for surgery, pain control, PT, OT, prophylactic antibiotics, VTE prophylaxis, progressive ambulation and ADL's and discharge planning. The patient is planning to be discharged home with home health services

## 2016-10-14 ENCOUNTER — Inpatient Hospital Stay (HOSPITAL_COMMUNITY): Payer: 59 | Admitting: Certified Registered"

## 2016-10-14 ENCOUNTER — Inpatient Hospital Stay (HOSPITAL_COMMUNITY): Payer: 59 | Admitting: Emergency Medicine

## 2016-10-14 ENCOUNTER — Encounter (HOSPITAL_COMMUNITY)
Admission: RE | Disposition: A | Discharge status: Home Health | Payer: Self-pay | Source: Ambulatory Visit | Attending: Orthopedic Surgery

## 2016-10-14 ENCOUNTER — Inpatient Hospital Stay (HOSPITAL_COMMUNITY)
Admission: RE | Admit: 2016-10-14 | Discharge: 2016-10-16 | DRG: 470 | Disposition: A | Discharge status: Home Health | Payer: 59 | Source: Ambulatory Visit | Attending: Orthopedic Surgery | Admitting: Orthopedic Surgery

## 2016-10-14 ENCOUNTER — Encounter (HOSPITAL_COMMUNITY): Payer: Self-pay | Admitting: *Deleted

## 2016-10-14 DIAGNOSIS — I10 Essential (primary) hypertension: Secondary | ICD-10-CM | POA: Diagnosis present

## 2016-10-14 DIAGNOSIS — Z9861 Coronary angioplasty status: Secondary | ICD-10-CM | POA: Diagnosis not present

## 2016-10-14 DIAGNOSIS — Z6837 Body mass index (BMI) 37.0-37.9, adult: Secondary | ICD-10-CM | POA: Diagnosis not present

## 2016-10-14 DIAGNOSIS — M25762 Osteophyte, left knee: Secondary | ICD-10-CM | POA: Diagnosis present

## 2016-10-14 DIAGNOSIS — Z7984 Long term (current) use of oral hypoglycemic drugs: Secondary | ICD-10-CM

## 2016-10-14 DIAGNOSIS — Z880 Allergy status to penicillin: Secondary | ICD-10-CM

## 2016-10-14 DIAGNOSIS — I252 Old myocardial infarction: Secondary | ICD-10-CM | POA: Diagnosis not present

## 2016-10-14 DIAGNOSIS — I2510 Atherosclerotic heart disease of native coronary artery without angina pectoris: Secondary | ICD-10-CM | POA: Diagnosis present

## 2016-10-14 DIAGNOSIS — E785 Hyperlipidemia, unspecified: Secondary | ICD-10-CM | POA: Diagnosis present

## 2016-10-14 DIAGNOSIS — Z888 Allergy status to other drugs, medicaments and biological substances status: Secondary | ICD-10-CM | POA: Diagnosis not present

## 2016-10-14 DIAGNOSIS — Z96651 Presence of right artificial knee joint: Secondary | ICD-10-CM | POA: Diagnosis present

## 2016-10-14 DIAGNOSIS — Z9049 Acquired absence of other specified parts of digestive tract: Secondary | ICD-10-CM

## 2016-10-14 DIAGNOSIS — M1712 Unilateral primary osteoarthritis, left knee: Secondary | ICD-10-CM | POA: Diagnosis present

## 2016-10-14 DIAGNOSIS — L409 Psoriasis, unspecified: Secondary | ICD-10-CM | POA: Diagnosis present

## 2016-10-14 DIAGNOSIS — E119 Type 2 diabetes mellitus without complications: Secondary | ICD-10-CM | POA: Diagnosis present

## 2016-10-14 DIAGNOSIS — F329 Major depressive disorder, single episode, unspecified: Secondary | ICD-10-CM | POA: Diagnosis present

## 2016-10-14 DIAGNOSIS — K219 Gastro-esophageal reflux disease without esophagitis: Secondary | ICD-10-CM | POA: Diagnosis present

## 2016-10-14 DIAGNOSIS — M25562 Pain in left knee: Secondary | ICD-10-CM | POA: Diagnosis present

## 2016-10-14 DIAGNOSIS — E669 Obesity, unspecified: Secondary | ICD-10-CM | POA: Diagnosis present

## 2016-10-14 HISTORY — DX: Unilateral primary osteoarthritis, left knee: M17.12

## 2016-10-14 HISTORY — PX: TOTAL KNEE ARTHROPLASTY: SHX125

## 2016-10-14 LAB — GLUCOSE, CAPILLARY
GLUCOSE-CAPILLARY: 241 mg/dL — AB (ref 65–99)
Glucose-Capillary: 247 mg/dL — ABNORMAL HIGH (ref 65–99)
Glucose-Capillary: 256 mg/dL — ABNORMAL HIGH (ref 65–99)
Glucose-Capillary: 233 mg/dL — ABNORMAL HIGH (ref 65–99)
Glucose-Capillary: 180 mg/dL — ABNORMAL HIGH (ref 65–99)

## 2016-10-14 SURGERY — ARTHROPLASTY, KNEE, TOTAL
Anesthesia: General | Site: Knee | Laterality: Left

## 2016-10-14 MED ORDER — OXYCODONE-ACETAMINOPHEN 5-325 MG PO TABS: 1.0000 | ORAL_TABLET | ORAL | 0 refills | Status: DC | PRN

## 2016-10-14 MED ORDER — NITROGLYCERIN 0.4 MG SL SUBL: 0.4000 mg | SUBLINGUAL_TABLET | SUBLINGUAL | Status: DC | PRN

## 2016-10-14 MED ORDER — FENTANYL CITRATE (PF) 100 MCG/2ML IJ SOLN
INTRAMUSCULAR | Status: AC
  Filled 2016-10-14: qty 2

## 2016-10-14 MED ORDER — NEOSTIGMINE METHYLSULFATE 10 MG/10ML IV SOLN
INTRAVENOUS | Status: DC | PRN
  Administered 2016-10-14: 4 mg via INTRAVENOUS

## 2016-10-14 MED ORDER — MENTHOL 3 MG MT LOZG
1.0000 | LOZENGE | OROMUCOSAL | Status: DC | PRN
  Filled 2016-10-14: qty 9

## 2016-10-14 MED ORDER — TRAZODONE HCL 50 MG PO TABS
50.0000 mg | ORAL_TABLET | Freq: Every day | ORAL | Status: DC
  Administered 2016-10-14 – 2016-10-15 (×2): 100 mg via ORAL
  Filled 2016-10-14 (×2): qty 2

## 2016-10-14 MED ORDER — ONDANSETRON HCL 4 MG/2ML IJ SOLN: 4.0000 mg | Freq: Four times a day (QID) | INTRAMUSCULAR | Status: DC | PRN

## 2016-10-14 MED ORDER — GLYCOPYRROLATE 0.2 MG/ML IJ SOLN
INTRAMUSCULAR | Status: DC | PRN
  Administered 2016-10-14: 0.6 mg via INTRAVENOUS

## 2016-10-14 MED ORDER — DIPHENHYDRAMINE HCL 12.5 MG/5ML PO ELIX: 12.5000 mg | ORAL_SOLUTION | ORAL | Status: DC | PRN

## 2016-10-14 MED ORDER — FLEET ENEMA 7-19 GM/118ML RE ENEM: 1.0000 | ENEMA | Freq: Once | RECTAL | Status: DC | PRN

## 2016-10-14 MED ORDER — ASPIRIN EC 325 MG PO TBEC
325.0000 mg | DELAYED_RELEASE_TABLET | Freq: Every day | ORAL | Status: DC
  Administered 2016-10-15 – 2016-10-16 (×2): 325 mg via ORAL
  Filled 2016-10-14 (×2): qty 1

## 2016-10-14 MED ORDER — KETOROLAC TROMETHAMINE 30 MG/ML IJ SOLN
INTRAMUSCULAR | Status: AC
  Filled 2016-10-14: qty 1

## 2016-10-14 MED ORDER — GLIPIZIDE 5 MG PO TABS
10.0000 mg | ORAL_TABLET | Freq: Two times a day (BID) | ORAL | Status: DC
  Administered 2016-10-14 – 2016-10-16 (×4): 10 mg via ORAL
  Filled 2016-10-14 (×4): qty 2

## 2016-10-14 MED ORDER — PROMETHAZINE HCL 25 MG/ML IJ SOLN: 6.2500 mg | INTRAMUSCULAR | Status: DC | PRN

## 2016-10-14 MED ORDER — ONDANSETRON HCL 4 MG PO TABS: 4.0000 mg | ORAL_TABLET | Freq: Four times a day (QID) | ORAL | Status: DC | PRN

## 2016-10-14 MED ORDER — FENTANYL CITRATE (PF) 100 MCG/2ML IJ SOLN
INTRAMUSCULAR | Status: DC | PRN
  Administered 2016-10-14 (×6): 50 ug via INTRAVENOUS

## 2016-10-14 MED ORDER — INSULIN ASPART 100 UNIT/ML ~~LOC~~ SOLN
SUBCUTANEOUS | Status: AC
  Filled 2016-10-14: qty 1

## 2016-10-14 MED ORDER — METFORMIN HCL 500 MG PO TABS
1000.0000 mg | ORAL_TABLET | Freq: Two times a day (BID) | ORAL | Status: DC
  Administered 2016-10-14 – 2016-10-16 (×4): 1000 mg via ORAL
  Filled 2016-10-14 (×4): qty 2

## 2016-10-14 MED ORDER — PAROXETINE HCL 20 MG PO TABS
40.0000 mg | ORAL_TABLET | Freq: Every day | ORAL | Status: DC
  Administered 2016-10-14 – 2016-10-16 (×3): 40 mg via ORAL
  Filled 2016-10-14 (×4): qty 2

## 2016-10-14 MED ORDER — PHENYLEPHRINE HCL 10 MG/ML IJ SOLN
INTRAMUSCULAR | Status: DC | PRN
  Administered 2016-10-14 (×5): 80 ug via INTRAVENOUS
  Administered 2016-10-14: 40 ug via INTRAVENOUS

## 2016-10-14 MED ORDER — MIDAZOLAM HCL 2 MG/2ML IJ SOLN
INTRAMUSCULAR | Status: AC
  Filled 2016-10-14: qty 2

## 2016-10-14 MED ORDER — INSULIN ASPART 100 UNIT/ML ~~LOC~~ SOLN
0.0000 [IU] | Freq: Three times a day (TID) | SUBCUTANEOUS | Status: DC
  Administered 2016-10-14: 5 [IU] via SUBCUTANEOUS
  Administered 2016-10-14 – 2016-10-15 (×2): 8 [IU] via SUBCUTANEOUS
  Administered 2016-10-15: 5 [IU] via SUBCUTANEOUS
  Administered 2016-10-15: 8 [IU] via SUBCUTANEOUS
  Administered 2016-10-16: 5 [IU] via SUBCUTANEOUS

## 2016-10-14 MED ORDER — SENNOSIDES-DOCUSATE SODIUM 8.6-50 MG PO TABS: 1.0000 | ORAL_TABLET | Freq: Every evening | ORAL | Status: DC | PRN

## 2016-10-14 MED ORDER — MIDAZOLAM HCL 5 MG/5ML IJ SOLN
INTRAMUSCULAR | Status: DC | PRN
  Administered 2016-10-14 (×4): 1 mg via INTRAVENOUS

## 2016-10-14 MED ORDER — METOCLOPRAMIDE HCL 5 MG PO TABS: 5.0000 mg | ORAL_TABLET | Freq: Three times a day (TID) | ORAL | Status: DC | PRN

## 2016-10-14 MED ORDER — ROCURONIUM BROMIDE 100 MG/10ML IV SOLN: INTRAVENOUS | Status: DC | PRN

## 2016-10-14 MED ORDER — MEPERIDINE HCL 25 MG/ML IJ SOLN: 6.2500 mg | INTRAMUSCULAR | Status: DC | PRN

## 2016-10-14 MED ORDER — EPHEDRINE SULFATE 50 MG/ML IJ SOLN
INTRAMUSCULAR | Status: DC | PRN
Start: 2016-10-14 — End: 2016-10-14
  Administered 2016-10-14: 10 mg via INTRAVENOUS

## 2016-10-14 MED ORDER — PROPOFOL 10 MG/ML IV BOLUS
INTRAVENOUS | Status: DC | PRN
  Administered 2016-10-14: 50 mg via INTRAVENOUS
  Administered 2016-10-14: 300 mg via INTRAVENOUS

## 2016-10-14 MED ORDER — KETOROLAC TROMETHAMINE 30 MG/ML IJ SOLN
30.0000 mg | Freq: Once | INTRAMUSCULAR | Status: AC
  Administered 2016-10-14: 30 mg via INTRAVENOUS

## 2016-10-14 MED ORDER — ROCURONIUM BROMIDE 100 MG/10ML IV SOLN
INTRAVENOUS | Status: DC | PRN
  Administered 2016-10-14: 5 mg via INTRAVENOUS
  Administered 2016-10-14: 30 mg via INTRAVENOUS
  Administered 2016-10-14: 10 mg via INTRAVENOUS

## 2016-10-14 MED ORDER — METHOCARBAMOL 1000 MG/10ML IJ SOLN
500.0000 mg | Freq: Four times a day (QID) | INTRAVENOUS | Status: DC | PRN
  Filled 2016-10-14: qty 5

## 2016-10-14 MED ORDER — ACETAMINOPHEN 325 MG PO TABS: 650.0000 mg | ORAL_TABLET | Freq: Four times a day (QID) | ORAL | Status: DC | PRN

## 2016-10-14 MED ORDER — BUPIVACAINE HCL (PF) 0.25 % IJ SOLN
INTRAMUSCULAR | Status: AC
  Filled 2016-10-14: qty 60

## 2016-10-14 MED ORDER — LIDOCAINE HCL (CARDIAC) 20 MG/ML IV SOLN
INTRAVENOUS | Status: DC | PRN
  Administered 2016-10-14: 50 mg via INTRAVENOUS

## 2016-10-14 MED ORDER — METHOCARBAMOL 500 MG PO TABS: 500.0000 mg | ORAL_TABLET | Freq: Two times a day (BID) | ORAL | 0 refills | Status: DC

## 2016-10-14 MED ORDER — HYDROMORPHONE HCL 1 MG/ML IJ SOLN
INTRAMUSCULAR | Status: AC
  Filled 2016-10-14: qty 0.5

## 2016-10-14 MED ORDER — ASPIRIN EC 325 MG PO TBEC: 325.0000 mg | DELAYED_RELEASE_TABLET | Freq: Two times a day (BID) | ORAL | 0 refills | Status: DC

## 2016-10-14 MED ORDER — HYDROMORPHONE HCL 2 MG/ML IJ SOLN
1.0000 mg | INTRAMUSCULAR | Status: DC | PRN
  Administered 2016-10-15 (×5): 1 mg via INTRAVENOUS
  Filled 2016-10-14 (×5): qty 1

## 2016-10-14 MED ORDER — LISINOPRIL 10 MG PO TABS
10.0000 mg | ORAL_TABLET | Freq: Every day | ORAL | Status: DC
  Administered 2016-10-14 – 2016-10-16 (×3): 10 mg via ORAL
  Filled 2016-10-14 (×3): qty 1

## 2016-10-14 MED ORDER — LACTATED RINGERS IV SOLN
INTRAVENOUS | Status: DC
  Administered 2016-10-14 (×2): via INTRAVENOUS

## 2016-10-14 MED ORDER — SUCCINYLCHOLINE CHLORIDE 20 MG/ML IJ SOLN
INTRAMUSCULAR | Status: DC | PRN
Start: 2016-10-14 — End: 2016-10-14
  Administered 2016-10-14: 140 mg via INTRAVENOUS

## 2016-10-14 MED ORDER — ACETAMINOPHEN 500 MG PO TABS
ORAL_TABLET | ORAL | Status: AC
  Filled 2016-10-14: qty 2

## 2016-10-14 MED ORDER — SODIUM CHLORIDE 0.9 % IJ SOLN
INTRAMUSCULAR | Status: DC | PRN
Start: 2016-10-14 — End: 2016-10-14
  Administered 2016-10-14: 50 mL

## 2016-10-14 MED ORDER — OXYCODONE HCL 5 MG PO TABS
5.0000 mg | ORAL_TABLET | ORAL | Status: DC | PRN
  Administered 2016-10-14 – 2016-10-15 (×5): 10 mg via ORAL
  Administered 2016-10-15: 5 mg via ORAL
  Administered 2016-10-16 (×4): 10 mg via ORAL
  Filled 2016-10-14 (×11): qty 2

## 2016-10-14 MED ORDER — GABAPENTIN 300 MG PO CAPS
900.0000 mg | ORAL_CAPSULE | Freq: Three times a day (TID) | ORAL | Status: DC
Start: 2016-10-14 — End: 2016-10-16
  Administered 2016-10-14 – 2016-10-16 (×6): 900 mg via ORAL
  Filled 2016-10-14 (×6): qty 3

## 2016-10-14 MED ORDER — PHENOL 1.4 % MT LIQD: 1.0000 | OROMUCOSAL | Status: DC | PRN

## 2016-10-14 MED ORDER — ACETAMINOPHEN 650 MG RE SUPP: 650.0000 mg | Freq: Four times a day (QID) | RECTAL | Status: DC | PRN

## 2016-10-14 MED ORDER — CHLORHEXIDINE GLUCONATE 4 % EX LIQD: 60.0000 mL | Freq: Once | CUTANEOUS | Status: DC

## 2016-10-14 MED ORDER — ALUM & MAG HYDROXIDE-SIMETH 200-200-20 MG/5ML PO SUSP: 30.0000 mL | ORAL | Status: DC | PRN

## 2016-10-14 MED ORDER — FUROSEMIDE 20 MG PO TABS
20.0000 mg | ORAL_TABLET | Freq: Every day | ORAL | Status: DC
  Administered 2016-10-14 – 2016-10-16 (×3): 20 mg via ORAL
  Filled 2016-10-14 (×3): qty 1

## 2016-10-14 MED ORDER — ONDANSETRON HCL 4 MG/2ML IJ SOLN
INTRAMUSCULAR | Status: DC | PRN
  Administered 2016-10-14: 4 mg via INTRAVENOUS

## 2016-10-14 MED ORDER — CELECOXIB 200 MG PO CAPS
200.0000 mg | ORAL_CAPSULE | Freq: Two times a day (BID) | ORAL | Status: DC
  Administered 2016-10-14 – 2016-10-16 (×4): 200 mg via ORAL
  Filled 2016-10-14 (×4): qty 1

## 2016-10-14 MED ORDER — DOCUSATE SODIUM 100 MG PO CAPS
100.0000 mg | ORAL_CAPSULE | Freq: Two times a day (BID) | ORAL | Status: DC
  Administered 2016-10-14 – 2016-10-16 (×4): 100 mg via ORAL
  Filled 2016-10-14 (×4): qty 1

## 2016-10-14 MED ORDER — BISACODYL 5 MG PO TBEC: 5.0000 mg | DELAYED_RELEASE_TABLET | Freq: Every day | ORAL | Status: DC | PRN

## 2016-10-14 MED ORDER — EXENATIDE ER 2 MG ~~LOC~~ PEN: 2.0000 mg | PEN_INJECTOR | SUBCUTANEOUS | Status: DC

## 2016-10-14 MED ORDER — INSULIN GLARGINE 100 UNIT/ML ~~LOC~~ SOLN
16.0000 [IU] | Freq: Every day | SUBCUTANEOUS | Status: DC
  Administered 2016-10-14 – 2016-10-15 (×2): 16 [IU] via SUBCUTANEOUS
  Filled 2016-10-14 (×2): qty 0.16

## 2016-10-14 MED ORDER — METOCLOPRAMIDE HCL 5 MG/ML IJ SOLN: 5.0000 mg | Freq: Three times a day (TID) | INTRAMUSCULAR | Status: DC | PRN

## 2016-10-14 MED ORDER — 0.9 % SODIUM CHLORIDE (POUR BTL) OPTIME
TOPICAL | Status: DC | PRN
  Administered 2016-10-14: 1000 mL

## 2016-10-14 MED ORDER — SODIUM CHLORIDE 0.9 % IR SOLN
Status: DC | PRN
  Administered 2016-10-14: 3000 mL

## 2016-10-14 MED ORDER — ACETAMINOPHEN 500 MG PO TABS
1000.0000 mg | ORAL_TABLET | Freq: Once | ORAL | Status: AC
  Administered 2016-10-14: 1000 mg via ORAL

## 2016-10-14 MED ORDER — CALCIPOTRIENE-BETAMETH DIPROP 0.005-0.064 % EX OINT: 1.0000 "application " | TOPICAL_OINTMENT | Freq: Two times a day (BID) | CUTANEOUS | Status: DC | PRN

## 2016-10-14 MED ORDER — AMLODIPINE BESYLATE 5 MG PO TABS
5.0000 mg | ORAL_TABLET | Freq: Every day | ORAL | Status: DC
  Administered 2016-10-16: 5 mg via ORAL
  Filled 2016-10-14 (×2): qty 1

## 2016-10-14 MED ORDER — METOPROLOL SUCCINATE ER 25 MG PO TB24
25.0000 mg | ORAL_TABLET | Freq: Every day | ORAL | Status: DC
  Administered 2016-10-15 – 2016-10-16 (×2): 25 mg via ORAL
  Filled 2016-10-14 (×2): qty 1

## 2016-10-14 MED ORDER — PROPOFOL 10 MG/ML IV BOLUS
INTRAVENOUS | Status: AC
  Filled 2016-10-14: qty 20

## 2016-10-14 MED ORDER — METHOCARBAMOL 500 MG PO TABS
500.0000 mg | ORAL_TABLET | Freq: Four times a day (QID) | ORAL | Status: DC | PRN
  Administered 2016-10-14 – 2016-10-16 (×5): 500 mg via ORAL
  Filled 2016-10-14 (×5): qty 1

## 2016-10-14 MED ORDER — PROPOFOL 1000 MG/100ML IV EMUL
INTRAVENOUS | Status: AC
  Filled 2016-10-14: qty 200

## 2016-10-14 MED ORDER — HYDROMORPHONE HCL 1 MG/ML IJ SOLN
0.2500 mg | INTRAMUSCULAR | Status: DC | PRN
  Administered 2016-10-14 (×2): 0.5 mg via INTRAVENOUS

## 2016-10-14 MED ORDER — KCL IN DEXTROSE-NACL 20-5-0.45 MEQ/L-%-% IV SOLN
INTRAVENOUS | Status: DC
  Administered 2016-10-14 – 2016-10-15 (×2): via INTRAVENOUS
  Filled 2016-10-14 (×3): qty 1000

## 2016-10-14 MED ORDER — PANTOPRAZOLE SODIUM 40 MG PO TBEC
40.0000 mg | DELAYED_RELEASE_TABLET | Freq: Every day | ORAL | Status: DC
  Administered 2016-10-15 – 2016-10-16 (×2): 40 mg via ORAL
  Filled 2016-10-14 (×2): qty 1

## 2016-10-14 MED ORDER — EPINEPHRINE PF 1 MG/ML IJ SOLN
INTRAMUSCULAR | Status: AC
  Filled 2016-10-14: qty 1

## 2016-10-14 MED ORDER — BUPIVACAINE-EPINEPHRINE (PF) 0.25% -1:200000 IJ SOLN
INTRAMUSCULAR | Status: DC | PRN
  Administered 2016-10-14: 50 mL

## 2016-10-14 MED ORDER — PROPOFOL 10 MG/ML IV BOLUS
INTRAVENOUS | Status: AC
  Filled 2016-10-14: qty 40

## 2016-10-14 SURGICAL SUPPLY — 53 items
BAG DECANTER FOR FLEXI CONT (MISCELLANEOUS) ×3 IMPLANT
BANDAGE ESMARK 6X9 LF (GAUZE/BANDAGES/DRESSINGS) ×1 IMPLANT
BLADE SAG 18X100X1.27 (BLADE) ×3 IMPLANT
BLADE SAW SGTL 13X75X1.27 (BLADE) ×3 IMPLANT
BNDG ELASTIC 6X10 VLCR STRL LF (GAUZE/BANDAGES/DRESSINGS) ×3 IMPLANT
BNDG ESMARK 6X9 LF (GAUZE/BANDAGES/DRESSINGS) ×3
BOWL SMART MIX CTS (DISPOSABLE) ×3 IMPLANT
CAPT KNEE TOTAL 3 ATTUNE ×3 IMPLANT
CEMENT HV SMART SET (Cement) ×6 IMPLANT
COVER SURGICAL LIGHT HANDLE (MISCELLANEOUS) ×3 IMPLANT
CUFF TOURNIQUET SINGLE 34IN LL (TOURNIQUET CUFF) ×3 IMPLANT
CUFF TOURNIQUET SINGLE 44IN (TOURNIQUET CUFF) IMPLANT
DRAPE EXTREMITY T 121X128X90 (DRAPE) ×3 IMPLANT
DRAPE U-SHAPE 47X51 STRL (DRAPES) ×3 IMPLANT
DRSG AQUACEL AG ADV 3.5X10 (GAUZE/BANDAGES/DRESSINGS) ×3 IMPLANT
DURAPREP 26ML APPLICATOR (WOUND CARE) ×6 IMPLANT
ELECT REM PT RETURN 9FT ADLT (ELECTROSURGICAL) ×3
ELECTRODE REM PT RTRN 9FT ADLT (ELECTROSURGICAL) ×1 IMPLANT
FILTER STRAW FLUID ASPIR (MISCELLANEOUS) ×6 IMPLANT
GLOVE BIO SURGEON STRL SZ7.5 (GLOVE) ×3 IMPLANT
GLOVE BIO SURGEON STRL SZ8.5 (GLOVE) ×3 IMPLANT
GLOVE BIOGEL PI IND STRL 8 (GLOVE) ×1 IMPLANT
GLOVE BIOGEL PI IND STRL 9 (GLOVE) ×1 IMPLANT
GLOVE BIOGEL PI INDICATOR 8 (GLOVE) ×2
GLOVE BIOGEL PI INDICATOR 9 (GLOVE) ×2
GOWN STRL REUS W/ TWL LRG LVL3 (GOWN DISPOSABLE) ×1 IMPLANT
GOWN STRL REUS W/ TWL XL LVL3 (GOWN DISPOSABLE) ×2 IMPLANT
GOWN STRL REUS W/TWL LRG LVL3 (GOWN DISPOSABLE) ×2
GOWN STRL REUS W/TWL XL LVL3 (GOWN DISPOSABLE) ×4
HANDPIECE INTERPULSE COAX TIP (DISPOSABLE) ×2
HOOD PEEL AWAY FACE SHEILD DIS (HOOD) ×6 IMPLANT
KIT BASIN OR (CUSTOM PROCEDURE TRAY) ×3 IMPLANT
KIT ROOM TURNOVER OR (KITS) ×3 IMPLANT
MANIFOLD NEPTUNE II (INSTRUMENTS) ×3 IMPLANT
NEEDLE 18GX1X1/2 (RX/OR ONLY) (NEEDLE) ×6 IMPLANT
NEEDLE 22X1 1/2 (OR ONLY) (NEEDLE) ×6 IMPLANT
NS IRRIG 1000ML POUR BTL (IV SOLUTION) ×3 IMPLANT
PACK TOTAL JOINT (CUSTOM PROCEDURE TRAY) ×3 IMPLANT
PAD ARMBOARD 7.5X6 YLW CONV (MISCELLANEOUS) ×6 IMPLANT
SET HNDPC FAN SPRY TIP SCT (DISPOSABLE) ×1 IMPLANT
SUT VIC AB 0 CT1 27 (SUTURE) ×2
SUT VIC AB 0 CT1 27XBRD ANBCTR (SUTURE) ×1 IMPLANT
SUT VIC AB 1 CTX 36 (SUTURE) ×2
SUT VIC AB 1 CTX36XBRD ANBCTR (SUTURE) ×1 IMPLANT
SUT VIC AB 2-0 CT1 27 (SUTURE) ×2
SUT VIC AB 2-0 CT1 TAPERPNT 27 (SUTURE) ×1 IMPLANT
SUT VIC AB 3-0 CT1 27 (SUTURE) ×2
SUT VIC AB 3-0 CT1 TAPERPNT 27 (SUTURE) ×1 IMPLANT
SYR CONTROL 10ML LL (SYRINGE) ×6 IMPLANT
SYR TB 1ML LUER SLIP (SYRINGE) ×6 IMPLANT
TOWEL OR 17X24 6PK STRL BLUE (TOWEL DISPOSABLE) ×3 IMPLANT
TOWEL OR 17X26 10 PK STRL BLUE (TOWEL DISPOSABLE) ×3 IMPLANT
TRAY CATH 16FR W/PLASTIC CATH (SET/KITS/TRAYS/PACK) IMPLANT

## 2016-10-14 NOTE — Progress Notes (Signed)
Orthopedic Tech Progress Note Patient Details:  Kyle [name] 10/11/1960 [id number]  Ortho Devices Ortho Device/Splint Location: foot roll Ortho Device/Splint Interventions: Application   [name] 10/14/2016, 2:56 PM

## 2016-10-14 NOTE — Discharge Instructions (Signed)

## 2016-10-14 NOTE — Interval H&P Note (Signed)
History and Physical Interval Note:  10/14/2016 9:56 AM  Kyle [name]  has presented today for surgery, with the diagnosis of LEFT KNEE OSTEOARTHRITIS  The various methods of treatment have been discussed with the patient and family. After consideration of risks, benefits and other options for treatment, the patient has consented to  Procedure(s): LEFT TOTAL KNEE ARTHROPLASTY (Left) as a surgical intervention .  The patient's history has been reviewed, patient examined, no change in status, stable for surgery.  I have reviewed the patient's chart and labs.  Questions were answered to the patient's satisfaction.     [name]

## 2016-10-14 NOTE — Anesthesia Postprocedure Evaluation (Addendum)
Anesthesia Post Note  Patient: Kyle [name]  Procedure(s) Performed: Procedure(s) (LRB): LEFT TOTAL KNEE ARTHROPLASTY (Left)  Patient location during evaluation: PACU Anesthesia Type: General Level of consciousness: awake and sedated Pain management: pain level not controlled Vital Signs Assessment: post-procedure vital signs reviewed and stable Respiratory status: spontaneous breathing Cardiovascular status: stable Postop Assessment: no signs of nausea or vomiting Anesthetic complications: no        Last Vitals:  Vitals:   10/14/16 1405 10/14/16 1415  BP: (!) 120/58   Pulse: 96 97  Resp: 13 15  Temp:      Last Pain:  Vitals:   10/14/16 1415  TempSrc:   PainSc: 9    Pain Goal:                 Kyna Blahnik JR,JOHN Karlin Heilman

## 2016-10-14 NOTE — Anesthesia Procedure Notes (Signed)
Procedure Name: Intubation Date/Time: 10/14/2016 11:34 AM Performed by: Wilder Glade Pre-anesthesia Checklist: Patient identified, Emergency Drugs available, Suction available, Patient being monitored and Timeout performed Patient Re-evaluated:Patient Re-evaluated prior to inductionOxygen Delivery Method: Circle system utilized Preoxygenation: Pre-oxygenation with 100% oxygen Intubation Type: IV induction Ventilation: Mask ventilation without difficulty Laryngoscope Size: Miller and 2 Grade View: Grade I Tube type: Oral Tube size: 7.5 mm Number of attempts: 1 Airway Equipment and Method: Stylet Placement Confirmation: ETT inserted through vocal cords under direct vision,  positive ETCO2 and breath sounds checked- equal and bilateral Secured at: 23 cm Tube secured with: Tape Dental Injury: Teeth and Oropharynx as per pre-operative assessment

## 2016-10-14 NOTE — Progress Notes (Signed)
Delay d/t IV. Trish, CRNA called for assistance. Room and Dr. Arby Barrette notified of same

## 2016-10-14 NOTE — Op Note (Signed)
PATIENT ID:      Kyle [name]  MRN:     [id number] DOB/AGE:    Aug 04, 1960 / 56 y.o.       OPERATIVE REPORT    DATE OF PROCEDURE:  10/14/2016       PREOPERATIVE DIAGNOSIS:   LEFT KNEE OSTEOARTHRITIS      Estimated body mass index is 37.95 kg/m as calculated from the following:   Height as of this encounter: 5\' 8"  (1.727 m).   Weight as of this encounter: 113.2 kg (249 lb 9.6 oz).                                                        POSTOPERATIVE DIAGNOSIS:   LEFT KNEE OSTEOARTHRITIS                                                                      PROCEDURE:  Procedure(s): LEFT TOTAL KNEE ARTHROPLASTY Using DepuyAttune RP implants #5L Femur, #6Tibia, 5 mm Attune RP bearing, 41 Patella     SURGEON: Corwin Kuiken J    ASSISTANT:   Eric K. [employer]   (Present and scrubbed throughout the case, critical for assistance with exposure, retraction, instrumentation, and closure.)         ANESTHESIA: GET, 20cc Exparel, 50cc 0.25% Marcaine  EBL: 300cc  FLUID REPLACEMENT: 1600 crystalloid  TOURNIQUET TIME:  Drains: None  Tranexamic Acid: 1gm IV, 2gm topical  COMPLICATIONS:  None         INDICATIONS FOR PROCEDURE: The patient has  LEFT KNEE OSTEOARTHRITIS, Var deformities, XR shows bone on bone arthritis, lateral subluxation of tibia. Patient has failed all conservative measures including anti-inflammatory medicines, narcotics, attempts at  exercise and weight loss, cortisone injections and viscosupplementation.  Risks and benefits of surgery have been discussed, questions answered.   DESCRIPTION OF PROCEDURE: The patient identified by armband, received  IV antibiotics, in the holding area at [facility]. Patient taken to the operating room, appropriate anesthetic  monitors were attached, and GET anesthesia was  induced. Tourniquet  applied high to the operative thigh. Lateral post and foot positioner  applied to the table, the lower extremity was then prepped and draped  in  usual sterile fashion from the toes to the tourniquet. Time-out procedure was performed. We began the operation, with the knee flexed 120 degrees, by making the anterior midline incision starting at handbreadth above the patella going over the patella 1 cm medial to and 4 cm distal to the tibial tubercle. Small bleeders in the skin and the  subcutaneous tissue identified and cauterized. Transverse retinaculum was incised and reflected medially and a medial parapatellar arthrotomy was accomplished. the patella was everted and theprepatellar fat pad resected. The superficial medial collateral  ligament was then elevated from anterior to posterior along the proximal  flare of the tibia and anterior half of the menisci resected. The knee was hyperflexed exposing bone on bone arthritis. Peripheral and notch osteophytes as well as the cruciate ligaments were then resected. We continued to  work our way around posteriorly along the  proximal tibia, and externally  rotated the tibia subluxing it out from underneath the femur. A McHale  retractor was placed through the notch and a lateral Hohmann retractor  placed, and we then drilled through the proximal tibia in line with the  axis of the tibia followed by an intramedullary guide rod and 2-degree  posterior slope cutting guide. The tibial cutting guide, 3 degree posterior sloped, was pinned into place allowing resection of 4 mm of bone medially and 12 mm of bone laterally. Satisfied with the tibial resection, we then  entered the distal femur 2 mm anterior to the PCL origin with the  intramedullary guide rod and applied the distal femoral cutting guide  set at 9 mm, with 5 degrees of valgus. This was pinned along the  epicondylar axis. At this point, the distal femoral cut was accomplished without difficulty. We then sized for a #5L femoral component and pinned the guide in 3 degrees of external rotation. The chamfer cutting guide was pinned into place. The  anterior, posterior, and chamfer cuts were accomplished without difficulty followed by  the Attune RP box cutting guide and the box cut. We also removed posterior osteophytes from the posterior femoral condyles. At this  time, the knee was brought into full extension. We checked our  extension and flexion gaps and found them symmetric for a 5 mm bearing. Distracting in extension with a lamina spreader, the posterior horns of the menisci were removed, and Exparel, diluted to 60 cc, with 20cc NS, and 20cc 0.5% Marcaine,was injected into the capsule and synovium of the knee. The posterior patella cut was accomplished with the 9.5 mm Attune cutting guide, sized for a 41mm dome, and the fixation pegs drilled.The knee  was then once again hyperflexed exposing the proximal tibia. We sized for a # 6 tibial base plate, applied the smokestack and the conical reamer followed by the the Delta fin keel punch. We then hammered into place the Attune RP trial femoral component, drilled the lugs, inserted a  5 mm trial bearing, trial patellar button, and took the knee through range of motion from 0-130 degrees. No thumb pressure was required for patellar Tracking. At this point, the limb was wrapped with an Esmarch bandage and the tourniquet inflated to 350 mmHg. All trial components were removed, mating surfaces irrigated with pulse lavage, and dried with suction and sponges. 10 cc of the Exparel solution was applied to the cancellus bone of the patella distal femur and proximal tibia.  After waiting 1 minute, the bony surfaces were again, dried with sponges. A double batch of DePuy HV cement with 1500 mg of Zinacef was mixed and applied to all bony metallic mating surfaces except for the posterior condyles of the femur itself. In order, we hammered into place the tibial tray and removed excess cement, the femoral component and removed excess cement. The final Attune RP bearing  was inserted, and the knee brought to full  extension with compression.  The patellar button was clamped into place, and excess cement  removed. While the cement cured the wound was irrigated out with normal saline solution pulse lavage. Ligament stability and patellar tracking were checked and found to be excellent. The parapatellar arthrotomy was closed with  running #1 Vicryl suture. The subcutaneous tissue with 0 and 2-0 undyed  Vicryl suture, and the skin with running 3-0 SQ vicryl. A dressing of Xeroform,  4 x 4, dressing sponges, Webril, and Ace wrap applied. The patient  awakened,  and taken to recovery room without difficulty.   [name] 10/14/2016, 12:44 PM

## 2016-10-14 NOTE — Anesthesia Preprocedure Evaluation (Signed)
Anesthesia Evaluation  Patient identified by MRN, date of birth, ID band Patient awake    Reviewed: Allergy & Precautions, NPO status , Patient's Chart, lab work & pertinent test results  Airway Mallampati: III   Neck ROM: Full    Dental no notable dental hx. (+) Teeth Intact, Dental Advisory Given   Pulmonary    Pulmonary exam normal breath sounds clear to auscultation       Cardiovascular hypertension, Pt. on medications and Pt. on home beta blockers Normal cardiovascular exam Rhythm:Regular     Neuro/Psych Depression    GI/Hepatic Neg liver ROS, GERD  Medicated and Controlled,  Endo/Other  diabetes, Type 2, Oral Hypoglycemic AgentsMorbid obesity  Renal/GU negative Renal ROS     Musculoskeletal   Abdominal (+) + obese,   Peds  Hematology 13/40   Anesthesia Other Findings   Reproductive/Obstetrics                             Anesthesia Physical  Anesthesia Plan  ASA: III  Anesthesia Plan: General   Post-op Pain Management:  Regional for Post-op pain   Induction: Intravenous  Airway Management Planned: Oral ETT  Additional Equipment:   Intra-op Plan:   Post-operative Plan: Extubation in OR  Informed Consent: I have reviewed the patients History and Physical, chart, labs and discussed the procedure including the risks, benefits and alternatives for the proposed anesthesia with the patient or authorized representative who has indicated his/her understanding and acceptance.     Plan Discussed with: CRNA and Surgeon  Anesthesia Plan Comments:         Anesthesia Quick Evaluation

## 2016-10-14 NOTE — Progress Notes (Signed)
Orthopedic Tech Progress Note Patient Details:  Kyle [name] September 21, 1960 [id number]  Ortho Devices Type of Ortho Device: Other (comment) Ortho Device/Splint Location: Applied Overhead Frame with trapeze bar for pt. Ortho Device/Splint Interventions: Application   Alvina Chou 10/14/2016, 6:31 PM

## 2016-10-14 NOTE — Anesthesia Procedure Notes (Addendum)
Anesthesia Regional Block: Adductor canal block   Pre-Anesthetic Checklist: ,, timeout performed, Correct Patient, Correct Site, Correct Laterality, Correct Procedure, Correct Position, site marked, Risks and benefits discussed,  Surgical consent,  Pre-op evaluation,  At surgeon's request and post-op pain management  Laterality: Left  Prep: chloraprep       Needles:  Injection technique: Single-shot  Needle Type: Echogenic Stimulator Needle     Needle Length: 9cm  Needle Gauge: 21   Needle insertion depth: 6 cm   Additional Needles:   Procedures: ultrasound guided,,,,,,,,  Narrative:  Start time: 10/14/2016 10:50 AM End time: 10/14/2016 10:55 AM Injection made incrementally with aspirations every 5 mL. Anesthesiologist: Leilani Able

## 2016-10-14 NOTE — Transfer of Care (Signed)
Immediate Anesthesia Transfer of Care Note  Patient: Kyle [name]  Procedure(s) Performed: Procedure(s): LEFT TOTAL KNEE ARTHROPLASTY (Left)  Patient Location: PACU  Anesthesia Type:GA combined with regional for post-op pain  Level of Consciousness: awake, alert , oriented and patient cooperative  Airway & Oxygen Therapy: Patient Spontanous Breathing and Patient connected to face mask oxygen  Post-op Assessment: Report given to RN and Post -op Vital signs reviewed and stable  Post vital signs: Reviewed and stable  Last Vitals:  Vitals:   10/14/16 1002 10/14/16 1320  BP: (!) 148/73   Pulse: 91   Resp: 18   Temp: 36.9 C 36.7 C    Last Pain:  Vitals:   10/14/16 1320  TempSrc:   PainSc: (P) 0-No pain         Complications: No apparent anesthesia complications

## 2016-10-15 ENCOUNTER — Encounter (HOSPITAL_COMMUNITY): Payer: Self-pay | Admitting: Orthopedic Surgery

## 2016-10-15 LAB — MRSA CULTURE: Culture: NOT DETECTED

## 2016-10-15 LAB — BASIC METABOLIC PANEL
ANION GAP: 8 (ref 5–15)
BUN: 12 mg/dL (ref 6–20)
CALCIUM: 8.8 mg/dL — AB (ref 8.9–10.3)
CO2: 29 mmol/L (ref 22–32)
Chloride: 99 mmol/L — ABNORMAL LOW (ref 101–111)
Creatinine, Ser: 1.52 mg/dL — ABNORMAL HIGH (ref 0.61–1.24)
GFR, EST AFRICAN AMERICAN: 58 mL/min — AB (ref >60)
GFR, EST NON AFRICAN AMERICAN: 50 mL/min — AB (ref >60)
Glucose, Bld: 155 mg/dL — ABNORMAL HIGH (ref 65–99)
POTASSIUM: 4.8 mmol/L (ref 3.5–5.1)
Sodium: 136 mmol/L (ref 135–145)

## 2016-10-15 LAB — CBC
HCT: 33.6 % — ABNORMAL LOW (ref 39.0–52.0)
HEMOGLOBIN: 10.5 g/dL — AB (ref 13.0–17.0)
MCH: 29.7 pg (ref 26.0–34.0)
MCHC: 31.3 g/dL (ref 30.0–36.0)
MCV: 94.9 fL (ref 78.0–100.0)
Platelets: 223 10*3/uL (ref 150–400)
RBC: 3.54 MIL/uL — ABNORMAL LOW (ref 4.22–5.81)
RDW: 13.6 % (ref 11.5–15.5)
WBC: 12.1 10*3/uL — ABNORMAL HIGH (ref 4.0–10.5)

## 2016-10-15 LAB — GLUCOSE, CAPILLARY
Glucose-Capillary: 160 mg/dL — ABNORMAL HIGH (ref 65–99)
Glucose-Capillary: 242 mg/dL — ABNORMAL HIGH (ref 65–99)
Glucose-Capillary: 260 mg/dL — ABNORMAL HIGH (ref 65–99)
Glucose-Capillary: 289 mg/dL — ABNORMAL HIGH (ref 65–99)

## 2016-10-15 LAB — HEMOGLOBIN A1C
HEMOGLOBIN A1C: 9.2 % — AB (ref 4.8–5.6)
MEAN PLASMA GLUCOSE: 217 mg/dL

## 2016-10-15 MED ORDER — LIVING WELL WITH DIABETES BOOK
Freq: Once | Status: AC
  Administered 2016-10-15: 20:00:00
  Filled 2016-10-15: qty 1

## 2016-10-15 NOTE — Care Management Note (Signed)
Case Management Note  Patient Details  Name: Kyle [name] MRN: [id number] Date of Birth: [birth year]  Subjective/Objective:                    Action/[name]:  Patient has 3 in1 and walker at home Expected Discharge Date:                  Expected Discharge [name]:  Home w Home Health Services  In-House Referral:     Discharge planning Services     Post Acute Care Choice:    Choice offered to:  Patient, Spouse  DME Arranged:    DME Agency:     HH Arranged:  PT, RN, OT, Social Work HH Agency:  [facility] (now [facility])  Status of Service:  Completed, signed off  If discussed at [employer], dates discussed:    Additional Comments:  Kyle Plan, RN 10/15/2016, 12:10 PM

## 2016-10-15 NOTE — Progress Notes (Signed)
10/15/16 1508  PT Visit Information  Last PT Received On 10/15/16  Assistance Needed +1  History of Present Illness Pt is 56 y/o male s/p elective L TKA secondary to L knee osteoarthritis. PMH includes DM, HTN, obesity, CAD, MI, and atherosclerosis with intermittent supervision.   Subjective Data  Patient Stated Goal to return home   Precautions  Precautions Knee  Precaution Booklet Issued Yes (comment)  Precaution Comments Reviewed supine exercises with pt. Pt reporting increased pain in L knee, so participation limited    Restrictions  Weight Bearing Restrictions Yes  LLE Weight Bearing WBAT  Pain Assessment  Pain Assessment 0-10  Pain Score 8  Pain Location L knee   Pain Descriptors / Indicators Aching;Operative site guarding;Sore  Pain Intervention(s) Limited activity within patient's tolerance;Monitored during session;Repositioned  Cognition  Arousal/Alertness Awake/alert  Behavior During Therapy WFL for tasks assessed/performed  Overall Cognitive Status Within Functional Limits for tasks assessed  Bed Mobility  Overal bed mobility Needs Assistance  Bed Mobility Sit to Supine  Sit to supine Min assist  General bed mobility comments Verbal cues for sequencing. Cues to hook LLE using RLE to assist with lifting LLE. Min A for BLE management.   Transfers  Overall transfer level Needs assistance  Equipment used Rolling walker (2 wheeled)  Transfers Sit to/from Stand  Sit to Stand Min assist  General transfer comment Min A for descent into sitting. Verbal cues for appropriate use of RW and appropriate hand placement.   Ambulation/Gait  Ambulation/Gait assistance Min assist  Ambulation Distance (Feet) 50 Feet  Assistive device Rolling walker (2 wheeled)  Gait Pattern/deviations Step-to pattern;Decreased step length - left;Decreased stance time - left;Antalgic  General Gait Details Pt demonstrating increased gait speed, requiring verbal cues for appropriate speed. Required  verbal cues for appropriate proximity to RW and appropriate use of UE during ambulation. Pt demonstrating L knee buckling with increased ambulation and required standing rest breaks secondary to increased fatigue. Min A required to prevent LOB.   Gait velocity Decreased  Gait velocity interpretation Below normal speed for age/gender  Balance  Overall balance assessment Needs assistance  Sitting-balance support No upper extremity supported;Feet supported  Sitting balance-Leahy Scale Fair  Standing balance support Bilateral upper extremity supported;During functional activity  Standing balance-Leahy Scale Poor  Standing balance comment Reliant on RW for stability   General Comments  General comments (skin integrity, edema, etc.) Pt oxygen levels >90% on 2L throughout session. HR ranging from 100-110. Pt reporting increase pain and fatigue which limited participation. Added supine LE exercises to current program.   Exercises  Exercises Total Joint  Total Joint Exercises  Towel Squeeze AROM;Both;10 reps;Supine  Short Arc Quad AROM;Left;10 reps;Supine  Hip ABduction/ADduction AROM;Left;10 reps;Supine  PT - End of Session  Equipment Utilized During Treatment Gait belt;Oxygen  Activity Tolerance Patient limited by fatigue;No increased pain  Patient left in bed;with call bell/phone within reach;with family/visitor present  Nurse Communication Mobility status  PT - Assessment/Plan  PT Plan Current plan remains appropriate  PT Visit Diagnosis Other abnormalities of gait and mobility (R26.89);Pain  Pain - Right/Left Left  Pain - part of body Knee  PT Frequency (ACUTE ONLY) 7X/week  Follow Up Recommendations Home health PT;Supervision/Assistance - 24 hour  PT equipment None recommended by PT  AM-PAC PT "6 Clicks" Daily Activity Outcome Measure  Difficulty turning over in bed (including adjusting bedclothes, sheets and blankets)? 2  Difficulty moving from lying on back to sitting on the side of  the bed?  2  Difficulty sitting down on and standing up from a chair with arms (e.g., wheelchair, bedside commode, etc,.)? 2  Help needed moving to and from a bed to chair (including a wheelchair)? 3  Help needed walking in hospital room? 3  Help needed climbing 3-5 steps with a railing?  2  6 Click Score 14  Mobility G Code  CK  PT Goal Progression  Progress towards PT goals Progressing toward goals  Acute Rehab PT Goals  PT Goal Formulation With patient  Time For Goal Achievement 10/22/16  Potential to Achieve Goals Good  PT Time Calculation  PT Start Time (ACUTE ONLY) 1431  PT Stop Time (ACUTE ONLY) 1459  PT Time Calculation (min) (ACUTE ONLY) 28 min  PT General Charges  $$ ACUTE PT VISIT 1 Procedure  PT Treatments  $Gait Training 8-22 mins  $Therapeutic Exercise 8-22 mins   Pt increased ambulation distance this session, however, continues to be limited by pain and fatigue. Pt demonstrating L knee buckling and required multiple standing rest breaks secondary to post op muscle weakness and fatigue. Pt asleep in bed, by the time PT exited. Pt oxygen levels >90% on 2L throughout session. Currently not safe to go home, but anticipate pt will progress well with continued therapy. Will continue to follow.    Margot Chimes, PT, DPT  Acute Rehabilitation Services  Pager: [phone]

## 2016-10-15 NOTE — Progress Notes (Signed)
PATIENT ID: Kyle [name]  MRN: [id number]  DOB/AGE:  Jul 28, 1961 / 56 y.o.  1 Day Post-Op Procedure(s) (LRB): LEFT TOTAL KNEE ARTHROPLASTY (Left)    PROGRESS NOTE Subjective: Patient is alert, oriented, no Nausea, no Vomiting, yes passing gas. Taking PO well. Denies SOB, Chest or Calf Pain. Using Incentive Spirometer, PAS in place. Ambulate WBAT to BR, Patient reports pain as 3/10 .    Objective: Vital signs in last 24 hours: Vitals:   10/14/16 1607 10/14/16 2144 10/15/16 0234 10/15/16 0624  BP: (!) 106/57 (!) 104/56 93/70 108/74  Pulse: 95 86 81 84  Resp:  17 18 17   Temp: 98.9 F (37.2 C) 98.5 F (36.9 C) 98.4 F (36.9 C) 98.3 F (36.8 C)  TempSrc: Oral Oral Oral Oral  SpO2: 90% 96% 92% 93%  Weight:      Height:          Intake/Output from previous day: I/O last 3 completed shifts: In: 2204.6 [P.O.:240; I.V.:1964.6] Out: 400 [Urine:300; Blood:100]   Intake/Output this shift: Total I/O In: 1649.6 [P.O.:360; I.V.:1289.6] Out: 725 [Urine:725]   LABORATORY DATA:  Recent Labs  10/14/16 1506 10/14/16 1738 10/14/16 2146 10/15/16 0409  WBC  --   --   --  12.1*  HGB  --   --   --  10.5*  HCT  --   --   --  33.6*  PLT  --   --   --  223  NA  --   --   --  136  K  --   --   --  4.8  CL  --   --   --  99*  CO2  --   --   --  29  BUN  --   --   --  12  CREATININE  --   --   --  1.52*  GLUCOSE  --   --   --  155*  GLUCAP 241* 233* 180*  --   CALCIUM  --   --   --  8.8*    Examination: Neurologically intact ABD soft Neurovascular intact Sensation intact distally Intact pulses distally Dorsiflexion/Plantar flexion intact Incision: dressing C/D/I No cellulitis present Compartment soft}  Assessment:   1 Day Post-Op Procedure(s) (LRB): LEFT TOTAL KNEE ARTHROPLASTY (Left) ADDITIONAL DIAGNOSIS: Expected Acute Blood Loss Anemia, Diabetes and Hypertension, obesity  Plan: PT/OT WBAT, AROM and PROM  DVT Prophylaxis:  SCDx72hrs, ASA 325 mg BID x 2 weeks DISCHARGE  PLAN: Home, today if passes PT DISCHARGE NEEDS: HHPT, Walker and 3-in-1 comode seat     Kyle [name] J 10/15/2016, 6:51 AM Patient ID: Kyle [name], male   DOB: 1961/06/12, 56 y.o.   MRN: [id number]

## 2016-10-15 NOTE — Evaluation (Signed)
Physical Therapy Evaluation Patient Details Name: Kyle [name] MRN: [id number] DOB: May 24, 1961 Today's Date: 10/15/2016   History of Present Illness  Pt is 56 y/o male s/p elective L TKA secondary to L knee osteoarthritis. PMH includes DM, HTN, obesity, CAD, MI, and atherosclerosis with intermittent supervision.   Clinical Impression  Pt admitted secondary to elective surgery above with deficits listed below. PTA, pt was independent with mobility with use of cane. Pt reports some difficulty with bed mobility at home. Upon evaluation, pt limited by pain and decreased oxygen sats (see general comments section and ambulation section). Pt requiring mod-max assist for mobility this session. Pt currently not safe to go home at this time, but expect pt will progress well towards goals when pain is decreased. Recommending follow up recommendations below. Will continue to follow to maximize functional mobility independence.     Follow Up Recommendations Home health PT;Supervision/Assistance - 24 hour    Equipment Recommendations  None recommended by PT    Recommendations for Other Services       Precautions / Restrictions Precautions Precautions: Knee Precaution Booklet Issued: Yes (comment) Precaution Comments: Reviewed supine exercises with pt. Pt reporting increased pain in L knee, so participation limited   Restrictions Weight Bearing Restrictions: Yes LLE Weight Bearing: Weight bearing as tolerated      Mobility  Bed Mobility Overal bed mobility: Needs Assistance Bed Mobility: Supine to Sit     Supine to sit: Max assist;HOB elevated     General bed mobility comments: Max A for trunk elevation. Continuous cuing for UE placement and sequencing. Mod A to scoot to EOB.   Transfers Overall transfer level: Needs assistance Equipment used: Rolling walker (2 wheeled) Transfers: Sit to/from Stand Sit to Stand: Mod assist         General transfer comment: Mod A for lifting assist.  Required 2 attempts to stand. Verbal cues to power through RLE to come up to full standing.   Ambulation/Gait Ambulation/Gait assistance: Min assist Ambulation Distance (Feet): 5 Feet Assistive device: Rolling walker (2 wheeled) Gait Pattern/deviations: Step-to pattern;Decreased step length - left;Decreased stance time - left;Antalgic Gait velocity: Decreased Gait velocity interpretation: Below normal speed for age/gender General Gait Details: Slow, guarded gait. Distance limited secondary to pain and decreased oxygen sats. Oxygen sats decreasing to 87% on 2 L with mobility. Verbal cues for pursed lip breathing required. With seated rest break, oxygen sats returned to >90% on 2L.   Stairs            Wheelchair Mobility    Modified Rankin (Stroke Patients Only)       Balance Overall balance assessment: Needs assistance Sitting-balance support: No upper extremity supported;Feet supported Sitting balance-Leahy Scale: Fair     Standing balance support: Bilateral upper extremity supported;During functional activity Standing balance-Leahy Scale: Poor Standing balance comment: Reliant on RW for stability                              Pertinent Vitals/Pain Pain Assessment: 0-10 Pain Score: 7  Pain Location: L knee  Pain Descriptors / Indicators: Aching;Operative site guarding;Sore Pain Intervention(s): Limited activity within patient's tolerance;Monitored during session;Repositioned    Home Living Family/patient expects to be discharged to:: Private residence Living Arrangements: Spouse/significant other;Children Available Help at Discharge: Family;Available 24 hours/day Type of Home: Mobile home Home Access: Ramped entrance     Home Layout: One level Home Equipment: Walker - 2 wheels;Bedside commode;Cane - single point;Hand  held shower head      Prior Function Level of Independence: Independent with assistive device(s)         Comments: Used a cane for  mobility. Pt reported he had trouble at home with bed mobility and used to "slide out" of bed to get out.      Hand Dominance   Dominant Hand: Right    Extremity/Trunk Assessment   Upper Extremity Assessment Upper Extremity Assessment: Defer to OT evaluation    Lower Extremity Assessment Lower Extremity Assessment: LLE deficits/detail LLE Deficits / Details: No sensory deficits reported. Consistent with post op pain and weakness. DF/PF at least 3/5, hip flexion and knee flexion/extension at least 2+/5; required assist to perform heel slides.        Communication   Communication: No difficulties  Cognition Arousal/Alertness: Awake/alert Behavior During Therapy: WFL for tasks assessed/performed Overall Cognitive Status: Within Functional Limits for tasks assessed                      General Comments General comments (skin integrity, edema, etc.): Pt heart rate ranging from 100-115 this session. Demonstrating HR of 100-110 at rest in bed. Oxygen sats fluctuating from 85%-93% on 2L throughout session. No SOB noted. Oxygen sats increased with cues for pursed lip breathing. Educated about use of incentive spirometry throughout the day to increase pulmonary function.     Exercises Total Joint Exercises Ankle Circles/Pumps: AROM;Both;10 reps;Supine Quad Sets: AROM;Left;10 reps;Supine Heel Slides: AAROM;Left;10 reps;Supine (min-mod A ) Goniometric ROM: 3-75   Assessment/Plan    PT Assessment Patient needs continued PT services  PT Problem List Decreased strength;Decreased range of motion;Decreased activity tolerance;Decreased balance;Decreased mobility;Decreased coordination;Decreased knowledge of use of DME;Decreased knowledge of precautions;Pain       PT Treatment Interventions DME instruction;Gait training;Functional mobility training;Therapeutic activities;Therapeutic exercise;Balance training;Neuromuscular re-education;Patient/family education    PT Goals (Current  goals can be found in the Care Plan section)  Acute Rehab PT Goals Patient Stated Goal: to return home  PT Goal Formulation: With patient Time For Goal Achievement: 10/22/16 Potential to Achieve Goals: Good    Frequency 7X/week   Barriers to discharge        Co-evaluation               End of Session Equipment Utilized During Treatment: Gait belt;Oxygen Activity Tolerance: Patient limited by pain;Treatment limited secondary to medical complications (Comment) (decreased oxygen sats ) Patient left: in chair;with call bell/phone within reach;with family/visitor present Nurse Communication: Mobility status PT Visit Diagnosis: Other abnormalities of gait and mobility (R26.89);Pain Pain - Right/Left: Left Pain - part of body: Knee         Time: 8119-1478 PT Time Calculation (min) (ACUTE ONLY): 35 min   Charges:   PT Evaluation $PT Eval Moderate Complexity: 1 Procedure PT Treatments $Gait Training: 8-22 mins   PT G Codes:         Arna Snipe 10/15/2016, 12:25 PM   Margot Chimes, PT, DPT  Acute Rehabilitation Services  Pager: [phone]

## 2016-10-15 NOTE — Evaluation (Signed)
Occupational Therapy Evaluation Patient Details Name: Kyle [name] MRN: [id number] DOB: October 14, 1960 Today's Date: 10/15/2016    History of Present Illness Pt is 56 y/o male s/p elective L TKA secondary to L knee osteoarthritis. PMH includes DM, HTN, obesity, CAD, MI, and atherosclerosis with intermittent supervision.    Clinical Impression   PTA, Pt was living with his wife and independent in majority of basic ADLs with some A for LB ADLs. Currently, pt performs UB ADLs, grooming at sink, and functional mobility with Min A and RW. Pt requires Max A for LB ADLs and would benefit from AE education. Pt would benefit from skilled acute OT to facilitate progress towards goals and safe dc home. Recommend dc home once medically stable with HHOT to increase independence and safety in ADls and functional mobility.     Follow Up Recommendations  Home health OT;Supervision/Assistance - 24 hour    Equipment Recommendations  None recommended by OT    Recommendations for Other Services       Precautions / Restrictions Precautions Precautions: Knee Precaution Booklet Issued: Yes (comment) Precaution Comments: Reviewed supine exercises with pt. Pt reporting increased pain in L knee, so participation limited   Restrictions Weight Bearing Restrictions: Yes LLE Weight Bearing: Weight bearing as tolerated      Mobility Bed Mobility Overal bed mobility: Needs Assistance Bed Mobility: Sit to Supine       Sit to supine: Min assist   General bed mobility comments: Pt in recliner upon arrival  Transfers Overall transfer level: Needs assistance Equipment used: Rolling walker (2 wheeled) Transfers: Sit to/from Stand Sit to Stand: Min assist         General transfer comment: VCs for hand placement    Balance Overall balance assessment: Needs assistance Sitting-balance support: No upper extremity supported;Feet supported Sitting balance-Leahy Scale: Fair     Standing balance support:  Bilateral upper extremity supported;During functional activity Standing balance-Leahy Scale: Fair Standing balance comment: Pt maitnained balance to perform grooming at sink with Min A                             ADL Overall ADL's : Needs assistance/impaired     Grooming: Oral care;Wash/dry face;Minimal assistance;Standing Grooming Details (indicate cue type and reason): Min A for balance Upper Body Bathing: Minimal assistance;Sitting   Lower Body Bathing: Maximal assistance;Sit to/from stand   Upper Body Dressing : Minimal assistance;Sitting   Lower Body Dressing: Maximal assistance;Sit to/from stand Lower Body Dressing Details (indicate cue type and reason): Pt attempt to don/dof socks but unable. may benefit from LB AE Toilet Transfer: Minimal assistance;Ambulation;RW;Grab bars;Regular Social worker and Hygiene: Set up;Supervision/safety;Sitting/lateral lean       Functional mobility during ADLs: Minimal assistance;Rolling walker       Vision         Perception     Praxis      Pertinent Vitals/Pain Pain Assessment: 0-10 Pain Score: 8  Pain Location: L knee  Pain Descriptors / Indicators: Aching;Operative site guarding;Sore Pain Intervention(s): Monitored during session     Hand Dominance Right   Extremity/Trunk Assessment Upper Extremity Assessment Upper Extremity Assessment: Overall WFL for tasks assessed   Lower Extremity Assessment Lower Extremity Assessment: Defer to PT evaluation       Communication Communication Communication: No difficulties   Cognition Arousal/Alertness: Awake/alert Behavior During Therapy: WFL for tasks assessed/performed Overall Cognitive Status: Within Functional Limits for tasks assessed  General Comments  At rest: SpO2 96% 2L, HR 105 After activity: SpO2 98%, HR 110    Exercises Exercises: Total Joint     Shoulder Instructions      Home Living  Family/patient expects to be discharged to:: Private residence Living Arrangements: Spouse/significant other;Children Available Help at Discharge: Family;Available 24 hours/day Type of Home: Mobile home Home Access: Ramped entrance     Home Layout: One level     Bathroom Shower/Tub: Tub/shower unit Shower/tub characteristics: Curtain Firefighter: Standard Bathroom Accessibility: Yes   Home Equipment: Environmental consultant - 2 wheels;Bedside commode;Cane - single point;Hand held shower head          Prior Functioning/Environment Level of Independence: Needs assistance  Gait / Transfers Assistance Needed: Used cane for functional mobility ADL's / Homemaking Assistance Needed: Wife helps with LB ADLs            OT Problem List: Decreased activity tolerance;Decreased strength;Decreased range of motion;Decreased knowledge of use of DME or AE;Pain      OT Treatment/Interventions: Self-care/ADL training;Therapeutic exercise;DME and/or AE instruction;Energy conservation;Therapeutic activities;Patient/family education    OT Goals(Current goals can be found in the care plan section) Acute Rehab OT Goals Patient Stated Goal: get stronger and go home OT Goal Formulation: With patient Time For Goal Achievement: 10/29/16 Potential to Achieve Goals: Good ADL Goals Pt Will Perform Grooming: with supervision;standing Pt Will Perform Lower Body Dressing: with supervision;with adaptive equipment;sit to/from stand Pt Will Transfer to Toilet: with supervision;ambulating;regular height toilet  OT Frequency: Min 2X/week   Barriers to D/C:            Co-evaluation              End of Session Equipment Utilized During Treatment: Rolling walker;Oxygen (2L) Nurse Communication: Mobility status  Activity Tolerance: Patient tolerated treatment well Patient left: Other (comment) (With PT)  OT Visit Diagnosis: Unsteadiness on feet (R26.81);Pain Pain - Right/Left: Left Pain - part of body:  Knee                ADL either performed or assessed with clinical judgement  Time: 1412-1443 OT Time Calculation (min): 31 min Charges:  OT General Charges $OT Visit: 1 Procedure OT Evaluation $OT Eval Low Complexity: 1 Procedure OT Treatments $Self Care/Home Management : 8-22 mins G-Codes:     [employer], OTR/L [phone]  Kyle [name] 10/15/2016, 4:07 PM

## 2016-10-15 NOTE — Progress Notes (Signed)
Inpatient Diabetes Program Recommendations  AACE/ADA: New Consensus Statement on Inpatient Glycemic Control (2015)  Target Ranges:  Prepandial:   less than 140 mg/dL      Peak postprandial:   less than 180 mg/dL (1-2 hours)      Critically ill patients:  140 - 180 mg/dL   Lab Results  Component Value Date   GLUCAP 289 (H) 10/15/2016   HGBA1C 9.2 (H) 10/14/2016    Review of Glycemic Control:  Results for Kyle [name], Kyle [name] (MRN [id number]) as of 10/15/2016 16:20  Ref. Range 10/14/2016 10:00 10/14/2016 13:42 10/14/2016 15:06 10/14/2016 17:38 10/14/2016 21:46 10/15/2016 07:58 10/15/2016 12:34  Glucose-Capillary Latest Ref Range: 65 - 99 mg/dL 449 (H) 675 (H) 916 (H) 233 (H) 180 (H) 242 (H) 289 (H)    Diabetes history: Type 2 diabetes Outpatient Diabetes medications: Basaglar 16 units q HS, Glipizide 10 mg bid, Bydureon 2 mg daily, Metformin 1000 mg bid Current orders for Inpatient glycemic control:  Lantus 16 units q HS, Metformin 1000 mg bid, Glucotrol 10 mg bid, Novolog moderate tid with meals  Inpatient Diabetes Program Recommendations:    Spoke with patient regarding elevated A1C- He states that he was recently put on insulin by PCP for the past 2-3 weeks.  Discussed elevated A1C and need for blood sugar control post-surgery.  Encouraged him to monitor closely and call MD if CBG's > 180 mg/dL.    Thanks, Beryl Meager, RN, BC-ADM Inpatient Diabetes Coordinator Pager [phone] (8a-5p)

## 2016-10-16 LAB — GLUCOSE, CAPILLARY
GLUCOSE-CAPILLARY: 219 mg/dL — AB (ref 65–99)
GLUCOSE-CAPILLARY: 217 mg/dL — AB (ref 65–99)

## 2016-10-16 LAB — CBC
HCT: 33.6 % — ABNORMAL LOW (ref 39.0–52.0)
Hemoglobin: 10.3 g/dL — ABNORMAL LOW (ref 13.0–17.0)
MCH: 28.7 pg (ref 26.0–34.0)
MCHC: 30.7 g/dL (ref 30.0–36.0)
MCV: 93.6 fL (ref 78.0–100.0)
PLATELETS: 223 10*3/uL (ref 150–400)
RBC: 3.59 MIL/uL — ABNORMAL LOW (ref 4.22–5.81)
RDW: 13.6 % (ref 11.5–15.5)
WBC: 9.7 10*3/uL (ref 4.0–10.5)

## 2016-10-16 MED ORDER — HYDROMORPHONE HCL 1 MG/ML IJ SOLN: 1.0000 mg | INTRAMUSCULAR | Status: DC | PRN

## 2016-10-16 NOTE — Progress Notes (Signed)
Occupational Therapy Treatment Patient Details Name: Kyle [name] MRN: [id number] DOB: 10-22-1960 Today's Date: 10/16/2016    History of present illness Pt is 56 y/o male s/p elective L TKA secondary to L knee osteoarthritis. PMH includes DM, HTN, obesity, CAD, MI, and atherosclerosis with intermittent supervision.    OT comments  Educated pt on bed mobility strategies (i.e. Rolling and leg lifting); pt demonstrated understanding with Min A for trunk.  Provided pt with education on LB dressing with AE. Pt demonstrated understanding and performed LB dressing with Min guard A for trunk support due to decreased core strength. Continue to recommend pt dc home with HHOT to increase independence and safety in ADLs and functional mobility.   Follow Up Recommendations  Home health OT;Supervision/Assistance - 24 hour    Equipment Recommendations  None recommended by OT    Recommendations for Other Services      Precautions / Restrictions Precautions Precautions: Knee Restrictions Weight Bearing Restrictions: Yes LLE Weight Bearing: Weight bearing as tolerated       Mobility Bed Mobility Overal bed mobility: Needs Assistance Bed Mobility: Sit to Supine     Supine to sit: HOB elevated;Min assist     General bed mobility comments: Pt required Min A to pull up trunk from supine. Educated pt on using sheet as leg lifter to increase independence in bed mobility  Transfers Overall transfer level: Needs assistance Equipment used: Rolling walker (2 wheeled) Transfers: Sit to/from Stand Sit to Stand: Min assist         General transfer comment: VCs for hand placement    Balance Overall balance assessment: Needs assistance Sitting-balance support: No upper extremity supported;Feet supported Sitting balance-Leahy Scale: Fair Sitting balance - Comments: Pt demonstrates decreased trunk strength and benefits from Min guard in seated to perform LB dressing   Standing balance support:  Bilateral upper extremity supported;During functional activity Standing balance-Leahy Scale: Fair Standing balance comment: Pt benefits from Min guard to gain balance when transitioning to standing                   ADL Overall ADL's : Needs assistance/impaired Eating/Feeding: Set up;Sitting                   Lower Body Dressing: Min guard;With adaptive equipment;Cueing for sequencing;Sit to/from stand Lower Body Dressing Details (indicate cue type and reason): Pt donned socks and pants with AE and Min guard for trunk support             Functional mobility during ADLs: Min guard;Rolling walker General ADL Comments: Educated pt on LB ADLs with AE; pt demonstrated understanding      Emergency planning/management officer   Behavior During Therapy: WFL for tasks assessed/performed Overall Cognitive Status: Within Functional Limits for tasks assessed                         Exercises     Shoulder Instructions       General Comments      Pertinent Vitals/ Pain       Pain Assessment: 0-10 Pain Score: 10-Worst pain ever Pain Location: L knee  Pain Descriptors / Indicators: Discomfort;Grimacing;Constant Pain Intervention(s): Monitored during session  Home Living  Prior Functioning/Environment              Frequency  Min 2X/week        Progress Toward Goals  OT Goals(current goals can now be found in the care plan section)  Progress towards OT goals: Progressing toward goals  Acute Rehab OT Goals Patient Stated Goal: get stronger and go home OT Goal Formulation: With patient Time For Goal Achievement: 10/29/16 Potential to Achieve Goals: Good ADL Goals Pt Will Perform Grooming: with supervision;standing Pt Will Perform Lower Body Dressing: with supervision;with adaptive equipment;sit to/from stand Pt Will Transfer to Toilet: with  supervision;ambulating;regular height toilet  Plan Discharge plan remains appropriate    Co-evaluation                 End of Session Equipment Utilized During Treatment: Rolling walker;Other (comment) (AE)  OT Visit Diagnosis: Unsteadiness on feet (R26.81);Pain Pain - Right/Left: Left Pain - part of body: Knee   Activity Tolerance Patient tolerated treatment well   Patient Left in chair;with call bell/phone within reach   Nurse Communication Mobility status        Time: 1610-9604 OT Time Calculation (min): 26 min  Charges: OT General Charges $OT Visit: 1 Procedure OT Treatments $Self Care/Home Management : 23-37 mins  Telisha Zawadzki, OTR/L [phone]   Theodoro Grist Tonnie Stillman 10/16/2016, 8:54 AM

## 2016-10-16 NOTE — Progress Notes (Signed)
PATIENT ID: Kyle [name]  MRN: [id number]  DOB/AGE:  Apr 21, 1961 / 56 y.o.  2 Days Post-Op Procedure(s) (LRB): LEFT TOTAL KNEE ARTHROPLASTY (Left)    PROGRESS NOTE Subjective: Patient is alert, oriented, no Nausea, no Vomiting, yes passing gas. Taking PO well. Denies SOB, Chest or Calf Pain. Using Incentive Spirometer, PAS in place. Ambulate WBAT with pt walking 50 ft, Patient reports pain as 9/10 .    Objective: Vital signs in last 24 hours: Vitals:   10/15/16 0624 10/15/16 1312 10/15/16 2203 10/16/16 0553  BP: 108/74 122/83 112/79 120/78  Pulse: 84 (!) 108 84 86  Resp: 17  17 18   Temp: 98.3 F (36.8 C) 98.3 F (36.8 C) 98.2 F (36.8 C) 98.3 F (36.8 C)  TempSrc: Oral Oral Oral Oral  SpO2: 93% 94% 92% 94%  Weight:      Height:          Intake/Output from previous day: I/O last 3 completed shifts: In: 4195.8 [P.O.:1242; I.V.:2953.8] Out: 3325 [Urine:3325]   Intake/Output this shift: No intake/output data recorded.   LABORATORY DATA:  Recent Labs  10/15/16 0409  10/15/16 1234 10/15/16 1757 10/15/16 2336 10/16/16 0343  WBC 12.1*  --   --   --   --  9.7  HGB 10.5*  --   --   --   --  10.3*  HCT 33.6*  --   --   --   --  33.6*  PLT 223  --   --   --   --  223  NA 136  --   --   --   --   --   K 4.8  --   --   --   --   --   CL 99*  --   --   --   --   --   CO2 29  --   --   --   --   --   BUN 12  --   --   --   --   --   CREATININE 1.52*  --   --   --   --   --   GLUCOSE 155*  --   --   --   --   --   GLUCAP  --   < > 289* 260* 160*  --   CALCIUM 8.8*  --   --   --   --   --   < > = values in this interval not displayed.  Examination: Neurologically intact Neurovascular intact Sensation intact distally Intact pulses distally Dorsiflexion/Plantar flexion intact Incision: dressing C/D/I and no drainage No cellulitis present Compartment soft}  Assessment:   2 Days Post-Op Procedure(s) (LRB): LEFT TOTAL KNEE ARTHROPLASTY (Left) ADDITIONAL DIAGNOSIS:  Expected Acute Blood Loss Anemia, Diabetes, Hypertension and obesity  Plan: PT/OT WBAT, AROM and PROM  DVT Prophylaxis:  SCDx72hrs, ASA 325 mg BID x 2 weeks DISCHARGE PLAN: Home DISCHARGE NEEDS: HHPT, Walker and 3-in-1 comode seat     Jola Critzer R 10/16/2016, 7:43 AM

## 2016-10-16 NOTE — Discharge Summary (Signed)
Patient ID: Kyle [name] MRN: [id number] DOB/AGE: 55-02-1961 55 y.o.  Admit date: 10/14/2016 Discharge date: 10/16/2016  Admission Diagnoses:  Principal Problem:   Primary osteoarthritis of left knee Active Problems:   Primary localized osteoarthritis of left knee   Discharge Diagnoses:  Same  Past Medical History:  Diagnosis Date  . Arthritis   . Coronary artery disease    minimal CAD '12; NL stress test 06/10/14 ([facility])  . Depression   . Diabetes mellitus without complication (HCC)   . Dyspnea    W/ EXERTION  PT STATES THEY FOUND SPOT ON LUNG SEVERAL YEARS AGO   . Family history of adverse reaction to anesthesia    "it made my son sick"  . GERD (gastroesophageal reflux disease)   . Hyperlipemia   . Hypertension   . Myocardial infarction    2012; hospitalized [facility] for chest pain syndrome 02/2011 and LHC showed trivial CAD  . Pneumonia 08/2016   surgery had to be rescheduled due to pneumonia  . Psoriasis     Surgeries: Procedure(s): LEFT TOTAL KNEE ARTHROPLASTY on 10/14/2016   Consultants:   Discharged Condition: Improved  Hospital Course: Kyle [name] is an 56 y.o. male who was admitted 10/14/2016 for operative treatment ofPrimary osteoarthritis of left knee. Patient has severe unremitting pain that affects sleep, daily activities, and work/hobbies. After pre-op clearance the patient was taken to the operating room on 10/14/2016 and underwent  Procedure(s): LEFT TOTAL KNEE ARTHROPLASTY.    Patient was given perioperative antibiotics: Anti-infectives    Start     Dose/Rate Route Frequency Ordered Stop   10/14/16 0930  vancomycin (VANCOCIN) 1,500 mg in sodium chloride 0.9 % 500 mL IVPB     1,500 mg 250 mL/hr over 120 Minutes Intravenous To ShortStay Surgical 10/11/16 1150 10/14/16 1301       Patient was given sequential compression devices, early ambulation, and chemoprophylaxis to prevent DVT.  Patient benefited maximally from hospital stay and there were no  complications.    Recent vital signs: Patient Vitals for the past 24 hrs:  BP Temp Temp src Pulse Resp SpO2  10/16/16 0553 120/78 98.3 F (36.8 C) Oral 86 18 94 %  10/15/16 2203 112/79 98.2 F (36.8 C) Oral 84 17 92 %  10/15/16 1312 122/83 98.3 F (36.8 C) Oral (!) 108 - 94 %     Recent laboratory studies:  Recent Labs  10/15/16 0409 10/16/16 0343  WBC 12.1* 9.7  HGB 10.5* 10.3*  HCT 33.6* 33.6*  PLT 223 223  NA 136  --   K 4.8  --   CL 99*  --   CO2 29  --   BUN 12  --   CREATININE 1.52*  --   GLUCOSE 155*  --   CALCIUM 8.8*  --      Discharge Medications:   Allergies as of 10/16/2016      Reactions   Cymbalta [duloxetine Hcl] Other (See Comments)   "makes me crazy" depression   Penicillins Hives, Swelling   Has patient had a PCN reaction causing immediate rash, facial/tongue/throat swelling, SOB or lightheadedness with hypotension: No Has patient had a PCN reaction causing severe rash involving mucus membranes or skin necrosis: No Has patient had a PCN reaction that required hospitalization No Has patient had a PCN reaction occurring within the last 10 years: No If all of the above answers are "NO", then may proceed with Cephalosporin use.      Medication List    STOP taking  these medications   HYDROcodone-acetaminophen 5-325 MG tablet Commonly known as:  NORCO/VICODIN   meloxicam 15 MG tablet Commonly known as:  MOBIC   mupirocin ointment 2 % Commonly known as:  BACTROBAN     TAKE these medications   amLODipine 5 MG tablet Commonly known as:  NORVASC Take 5 mg by mouth daily.   aspirin EC 325 MG tablet Take 1 tablet (325 mg total) by mouth 2 (two) times daily.   BASAGLAR KWIKPEN 100 UNIT/ML Sopn Inject 16 Units into the skin at bedtime. INSTEAD OF LANTUS   BYDUREON 2 MG Pen Generic drug:  Exenatide ER Inject 2 mg into the skin See admin instructions. Up to twice a week (patient varies usage based on blood sugar readings)    calcipotriene-betamethasone ointment Commonly known as:  TACLONEX Apply 1 application topically 2 (two) times daily as needed. For psoriasis.   furosemide 20 MG tablet Commonly known as:  LASIX Take 20 mg by mouth daily.   gabapentin 300 MG capsule Commonly known as:  NEURONTIN Take 900 mg by mouth 3 (three) times daily.   glipiZIDE 10 MG tablet Commonly known as:  GLUCOTROL Take 10 mg by mouth 2 (two) times daily.   lisinopril 10 MG tablet Commonly known as:  PRINIVIL,ZESTRIL Take 10 mg by mouth daily.   metFORMIN 500 MG tablet Commonly known as:  GLUCOPHAGE Take 1,000 mg by mouth 2 (two) times daily.   methocarbamol 500 MG tablet Commonly known as:  ROBAXIN Take 1 tablet (500 mg total) by mouth 2 (two) times daily with a meal. What changed:  Another medication with the same name was removed. Continue taking this medication, and follow the directions you see here.   metoprolol succinate 25 MG 24 hr tablet Commonly known as:  TOPROL-XL Take 25 mg by mouth daily.   nitroGLYCERIN 0.4 MG SL tablet Commonly known as:  NITROSTAT Place 0.4 mg under the tongue every 5 (five) minutes as needed for chest pain.   omeprazole 40 MG capsule Commonly known as:  PRILOSEC Take 40 mg by mouth daily.   oxyCODONE-acetaminophen 5-325 MG tablet Commonly known as:  ROXICET Take 1-2 tablets by mouth every 4 (four) hours as needed.   PARoxetine 40 MG tablet Commonly known as:  PAXIL Take 40 mg by mouth daily.   rosuvastatin 20 MG tablet Commonly known as:  CRESTOR Take 20 mg by mouth at bedtime.   traZODone 50 MG tablet Commonly known as:  DESYREL Take 50-100 mg by mouth at bedtime.            Durable Medical Equipment        Start     Ordered   10/14/16 1606  DME Walker rolling  Once    Question:  Patient needs a walker to treat with the following condition  Answer:  Primary localized osteoarthritis of left knee   10/14/16 1605   10/14/16 1606  DME 3 n 1  Once      10/14/16 1605   10/14/16 1606  DME Bedside commode  Once    Question:  Patient needs a bedside commode to treat with the following condition  Answer:  Primary localized osteoarthritis of left knee   10/14/16 1605      Diagnostic Studies: No results found.  Disposition: 06-Home-Health Care Svc  Discharge Instructions    Call MD / Call 911    Complete by:  As directed    If you experience chest pain or shortness of breath,  CALL 911 and be transported to the hospital emergency room.  If you develope a fever above 101 F, pus (white drainage) or increased drainage or redness at the wound, or calf pain, call your surgeon's office.   Change dressing    Complete by:  As directed    Change dressing on 5, then change the dressing daily with sterile 4 x 4 inch gauze dressing and apply TED hose.  You may clean the incision with alcohol prior to redressing.   Constipation Prevention    Complete by:  As directed    Drink plenty of fluids.  Prune juice may be helpful.  You may use a stool softener, such as Colace (over the counter) 100 mg twice a day.  Use MiraLax (over the counter) for constipation as needed.   Diet - low sodium heart healthy    Complete by:  As directed    Driving restrictions    Complete by:  As directed    No driving for 2 weeks   Increase activity slowly as tolerated    Complete by:  As directed    Patient may shower    Complete by:  As directed    You may shower without a dressing once there is no drainage.  Do not wash over the wound.  If drainage remains, cover wound with plastic wrap and then shower.      Follow-up Information    [name], MD Follow up in 2 week(s).   Specialty:  Orthopedic Surgery Contact information: [address] [place] [address] [phone]            Signed: Vear Clock Deliana Avalos R 10/16/2016, 7:45 AM

## 2016-10-16 NOTE — Progress Notes (Signed)
Physical Therapy Treatment Patient Details Name: Kyle [name] MRN: [id number] DOB: [birth year] Today's Date: 10/16/2016    History of Present Illness Pt is 56 y/o male s/p elective L TKA secondary to L knee osteoarthritis. PMH includes DM, HTN, obesity, CAD, MI, and atherosclerosis with intermittent supervision.     PT Comments    Patient is progressing toward mobility goals.  Pt able to increase gait distance and complete HEP this session. HEP handout reviewed as well as precautions/positioning, and use of ice/zero degree foam. Continue to progress as tolerated with anticipated d/c home with HHPT.   Follow Up Recommendations  Home health PT;Supervision/Assistance - 24 hour     Equipment Recommendations  None recommended by PT    Recommendations for Other Services       Precautions / Restrictions Precautions Precautions: Knee Precaution Comments: reviewed precautions/positioning Restrictions Weight Bearing Restrictions: Yes LLE Weight Bearing: Weight bearing as tolerated    Mobility  Bed Mobility               General bed mobility comments: pt OOB in chair upon arrival  Transfers Overall transfer level: Needs assistance Equipment used: Rolling walker (2 wheeled) Transfers: Sit to/from Stand Sit to Stand: Min guard         General transfer comment: min guard for safety; no physical assist needed; safe hand placement and technique  Ambulation/Gait Ambulation/Gait assistance: Supervision Ambulation Distance (Feet): 125 Feet Assistive device: Rolling walker (2 wheeled) Gait Pattern/deviations: Decreased stance time - left;Step-through pattern;Decreased stride length;Decreased weight shift to left Gait velocity: Decreased   General Gait Details: cues to engage L quad during stance phase and for posture; pt with improved L knee extension and step length symmetry with increased distance   Stairs            Wheelchair Mobility    Modified Rankin (Stroke  Patients Only)       Balance Overall balance assessment: Needs assistance Sitting-balance support: No upper extremity supported;Feet supported Sitting balance-Leahy Scale: Fair       Standing balance-Leahy Scale: Fair Standing balance comment: able to static stand without UE support                    Cognition Arousal/Alertness: Awake/alert Behavior During Therapy: WFL for tasks assessed/performed Overall Cognitive Status: Within Functional Limits for tasks assessed                      Exercises Total Joint Exercises Ankle Circles/Pumps: AROM;Both;10 reps Quad Sets: AROM;Left;10 reps Short Arc Quad: AROM;Left;10 reps Heel Slides: AAROM;Left;10 reps Hip ABduction/ADduction: AROM;Left;10 reps Straight Leg Raises: AAROM;AROM;Left;5 reps Long Arc Quad: AROM;Left;10 reps Goniometric ROM: 0-85    General Comments        Pertinent Vitals/Pain Pain Assessment: Faces Faces Pain Scale: Hurts little more Pain Location: L knee  Pain Descriptors / Indicators: Sore;Aching Pain Intervention(s): Monitored during session;Premedicated before session;Repositioned    Home Living                      Prior Function            PT Goals (current goals can now be found in the care plan section) Acute Rehab PT Goals Patient Stated Goal: to return home  PT Goal Formulation: With patient Time For Goal Achievement: 10/22/16 Potential to Achieve Goals: Good Progress towards PT goals: Progressing toward goals    Frequency    7X/week      PT  Plan Current plan remains appropriate    Co-evaluation             End of Session Equipment Utilized During Treatment: Gait belt Activity Tolerance: Patient tolerated treatment well Patient left: with call bell/phone within reach;in chair Nurse Communication: Mobility status PT Visit Diagnosis: Other abnormalities of gait and mobility (R26.89);Pain Pain - Right/Left: Left Pain - part of body: Knee      Time: 1123-1200 PT Time Calculation (min) (ACUTE ONLY): 37 min  Charges:  $Gait Training: 8-22 mins $Therapeutic Exercise: 8-22 mins                    G Codes:       Derek Mound, PTA Pager: [phone]   10/16/2016, 12:08 PM

## 2016-10-16 NOTE — Progress Notes (Signed)
Discharge instructions given to pt and wife. Verbalized understanding. Left knee incision with Aquacel dressing with compression wrap dry and intact. Incision care and dressing changes explained to pt. Discharged to home accompanied by wife.

## 2016-11-13 IMAGING — CR DG CHEST 2V
2 series · 2 of 2 positions shown · non-contrast
Comparison: 07/25/2014

CLINICAL DATA: Preop for total right knee replacement

EXAM:
CHEST  2 VIEW

[w chest pa]
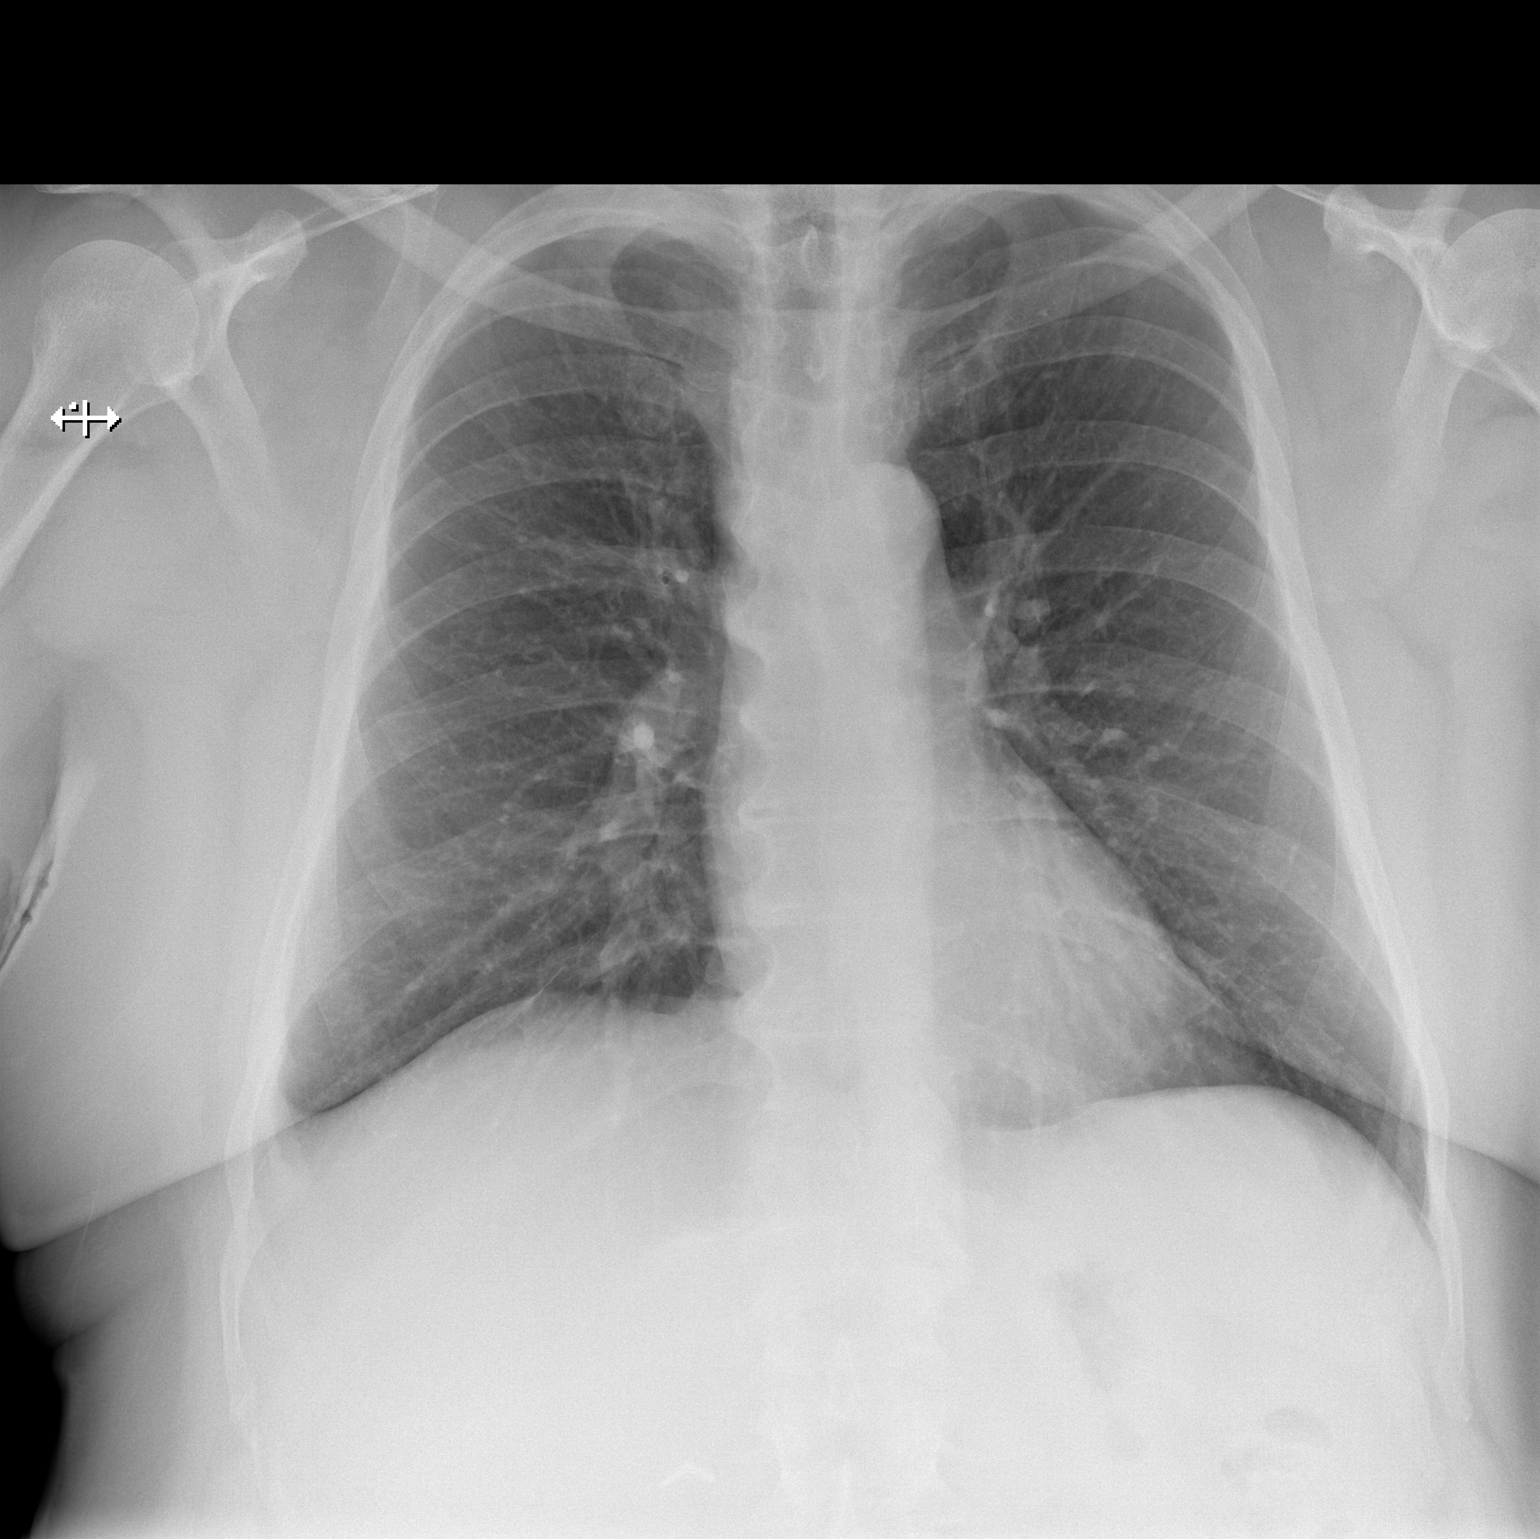

[w chest lat]
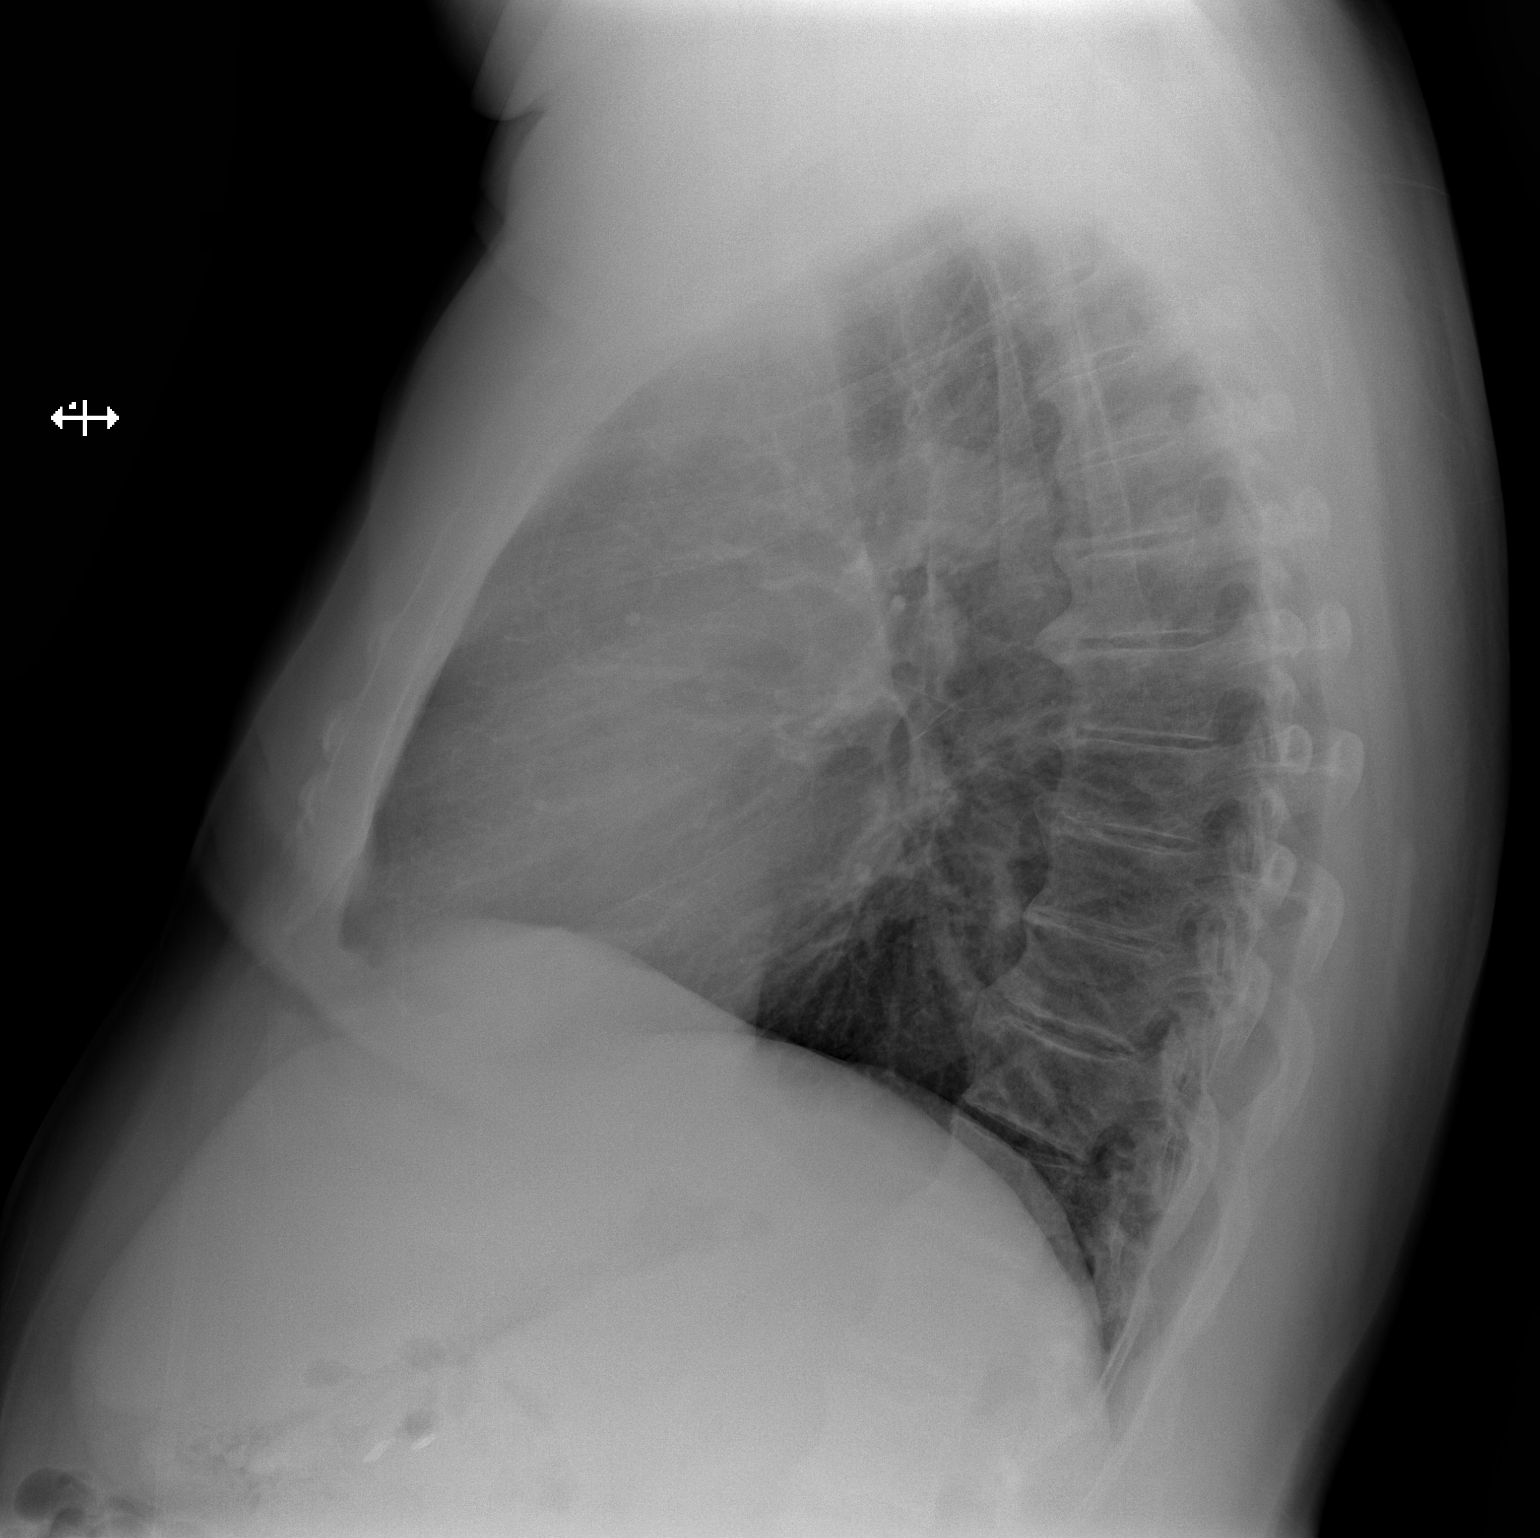

[2 of 2 positions shown; findings below may reference images not displayed]

FINDINGS: Cardiomediastinal silhouette is unremarkable. No acute infiltrate or
pleural effusion. No pulmonary edema. Mild degenerative change
thoracic spine.
IMPRESSION: No active cardiopulmonary disease. Degenerative changes thoracic
spine.

## 2017-01-03 NOTE — Addendum Note (Signed)
Addendum  created 01/03/17 1111 by Leilani Able, MD   Sign clinical note

## 2017-04-03 DIAGNOSIS — R002 Palpitations: Secondary | ICD-10-CM | POA: Diagnosis not present

## 2017-08-28 DIAGNOSIS — J9601 Acute respiratory failure with hypoxia: Secondary | ICD-10-CM

## 2017-08-28 DIAGNOSIS — J189 Pneumonia, unspecified organism: Secondary | ICD-10-CM

## 2017-08-29 DIAGNOSIS — J9601 Acute respiratory failure with hypoxia: Secondary | ICD-10-CM | POA: Diagnosis not present

## 2017-08-29 DIAGNOSIS — J189 Pneumonia, unspecified organism: Secondary | ICD-10-CM | POA: Diagnosis not present

## 2017-08-30 DIAGNOSIS — J189 Pneumonia, unspecified organism: Secondary | ICD-10-CM | POA: Diagnosis not present

## 2017-08-30 DIAGNOSIS — J9601 Acute respiratory failure with hypoxia: Secondary | ICD-10-CM | POA: Diagnosis not present

## 2018-08-10 DIAGNOSIS — E6609 Other obesity due to excess calories: Secondary | ICD-10-CM | POA: Diagnosis not present

## 2018-08-10 DIAGNOSIS — E119 Type 2 diabetes mellitus without complications: Secondary | ICD-10-CM | POA: Diagnosis not present

## 2018-08-10 DIAGNOSIS — M545 Low back pain: Secondary | ICD-10-CM | POA: Diagnosis not present

## 2018-08-10 DIAGNOSIS — G8929 Other chronic pain: Secondary | ICD-10-CM | POA: Diagnosis not present

## 2018-08-10 DIAGNOSIS — R05 Cough: Secondary | ICD-10-CM | POA: Diagnosis not present

## 2018-08-10 DIAGNOSIS — I10 Essential (primary) hypertension: Secondary | ICD-10-CM | POA: Diagnosis not present

## 2018-10-27 DIAGNOSIS — Z79899 Other long term (current) drug therapy: Secondary | ICD-10-CM | POA: Diagnosis not present

## 2018-11-03 DIAGNOSIS — Z79899 Other long term (current) drug therapy: Secondary | ICD-10-CM | POA: Diagnosis not present

## 2018-12-10 DIAGNOSIS — Z79899 Other long term (current) drug therapy: Secondary | ICD-10-CM | POA: Diagnosis not present

## 2019-01-04 DIAGNOSIS — E1165 Type 2 diabetes mellitus with hyperglycemia: Secondary | ICD-10-CM | POA: Diagnosis not present

## 2019-01-04 DIAGNOSIS — E1142 Type 2 diabetes mellitus with diabetic polyneuropathy: Secondary | ICD-10-CM | POA: Diagnosis not present

## 2019-01-04 DIAGNOSIS — R05 Cough: Secondary | ICD-10-CM | POA: Diagnosis not present

## 2019-01-04 DIAGNOSIS — I10 Essential (primary) hypertension: Secondary | ICD-10-CM | POA: Diagnosis not present

## 2019-01-04 DIAGNOSIS — R0600 Dyspnea, unspecified: Secondary | ICD-10-CM | POA: Diagnosis not present

## 2019-01-12 DIAGNOSIS — I10 Essential (primary) hypertension: Secondary | ICD-10-CM | POA: Diagnosis not present

## 2019-01-12 DIAGNOSIS — L409 Psoriasis, unspecified: Secondary | ICD-10-CM | POA: Diagnosis not present

## 2019-01-12 DIAGNOSIS — E559 Vitamin D deficiency, unspecified: Secondary | ICD-10-CM | POA: Diagnosis not present

## 2019-01-12 DIAGNOSIS — E790 Hyperuricemia without signs of inflammatory arthritis and tophaceous disease: Secondary | ICD-10-CM | POA: Diagnosis not present

## 2019-01-12 DIAGNOSIS — E6609 Other obesity due to excess calories: Secondary | ICD-10-CM | POA: Diagnosis not present

## 2019-01-12 DIAGNOSIS — E1165 Type 2 diabetes mellitus with hyperglycemia: Secondary | ICD-10-CM | POA: Diagnosis not present

## 2019-01-12 DIAGNOSIS — E039 Hypothyroidism, unspecified: Secondary | ICD-10-CM | POA: Diagnosis not present

## 2019-01-13 DIAGNOSIS — Z79899 Other long term (current) drug therapy: Secondary | ICD-10-CM | POA: Diagnosis not present

## 2019-01-26 DIAGNOSIS — R05 Cough: Secondary | ICD-10-CM | POA: Diagnosis not present

## 2019-01-26 DIAGNOSIS — E119 Type 2 diabetes mellitus without complications: Secondary | ICD-10-CM | POA: Diagnosis not present

## 2019-01-26 DIAGNOSIS — R0602 Shortness of breath: Secondary | ICD-10-CM | POA: Diagnosis not present

## 2019-01-26 DIAGNOSIS — I10 Essential (primary) hypertension: Secondary | ICD-10-CM | POA: Diagnosis not present

## 2019-01-26 DIAGNOSIS — R0902 Hypoxemia: Secondary | ICD-10-CM | POA: Diagnosis not present

## 2019-02-02 DIAGNOSIS — Z6837 Body mass index (BMI) 37.0-37.9, adult: Secondary | ICD-10-CM | POA: Diagnosis not present

## 2019-02-02 DIAGNOSIS — E1165 Type 2 diabetes mellitus with hyperglycemia: Secondary | ICD-10-CM | POA: Diagnosis not present

## 2019-02-02 DIAGNOSIS — I10 Essential (primary) hypertension: Secondary | ICD-10-CM | POA: Diagnosis not present

## 2019-02-02 DIAGNOSIS — J4530 Mild persistent asthma, uncomplicated: Secondary | ICD-10-CM | POA: Diagnosis not present

## 2019-02-09 DIAGNOSIS — J4530 Mild persistent asthma, uncomplicated: Secondary | ICD-10-CM | POA: Diagnosis not present

## 2019-02-09 DIAGNOSIS — R05 Cough: Secondary | ICD-10-CM | POA: Diagnosis not present

## 2019-02-18 DIAGNOSIS — Z03818 Encounter for observation for suspected exposure to other biological agents ruled out: Secondary | ICD-10-CM | POA: Diagnosis not present

## 2019-02-18 DIAGNOSIS — Z20828 Contact with and (suspected) exposure to other viral communicable diseases: Secondary | ICD-10-CM | POA: Diagnosis not present

## 2019-03-12 DIAGNOSIS — E1165 Type 2 diabetes mellitus with hyperglycemia: Secondary | ICD-10-CM | POA: Diagnosis not present

## 2019-03-12 DIAGNOSIS — Z9119 Patient's noncompliance with other medical treatment and regimen: Secondary | ICD-10-CM | POA: Diagnosis not present

## 2019-03-12 DIAGNOSIS — G4709 Other insomnia: Secondary | ICD-10-CM | POA: Diagnosis not present

## 2019-03-12 DIAGNOSIS — I10 Essential (primary) hypertension: Secondary | ICD-10-CM | POA: Diagnosis not present

## 2019-04-13 DIAGNOSIS — Z79899 Other long term (current) drug therapy: Secondary | ICD-10-CM | POA: Diagnosis not present

## 2019-05-07 DIAGNOSIS — I10 Essential (primary) hypertension: Secondary | ICD-10-CM | POA: Diagnosis not present

## 2019-05-07 DIAGNOSIS — Z9119 Patient's noncompliance with other medical treatment and regimen: Secondary | ICD-10-CM | POA: Diagnosis not present

## 2019-05-07 DIAGNOSIS — Z6836 Body mass index (BMI) 36.0-36.9, adult: Secondary | ICD-10-CM | POA: Diagnosis not present

## 2019-05-07 DIAGNOSIS — E1165 Type 2 diabetes mellitus with hyperglycemia: Secondary | ICD-10-CM | POA: Diagnosis not present

## 2019-05-10 DIAGNOSIS — Z79899 Other long term (current) drug therapy: Secondary | ICD-10-CM | POA: Diagnosis not present

## 2019-06-23 DIAGNOSIS — Z79899 Other long term (current) drug therapy: Secondary | ICD-10-CM | POA: Diagnosis not present

## 2019-07-08 ENCOUNTER — Other Ambulatory Visit: Payer: Self-pay

## 2019-07-12 ENCOUNTER — Ambulatory Visit: Payer: 59 | Admitting: Internal Medicine

## 2019-07-12 NOTE — Progress Notes (Deleted)
Name: Kyle [name]  MRN/ DOB: [id number], 02-Jun-1961   Age/ Sex: 58 y.o., male    PCP: Kyle [name], [place]   Reason for Endocrinology Evaluation: Type {NUMBERS 1 OR 2:522190} Diabetes Mellitus     Date of Initial Endocrinology Visit: 07/12/2019     PATIENT IDENTIFIER: Kyle [name] is a 58 y.o. male with a past medical history of ***. The patient presented for initial endocrinology clinic visit on 07/12/2019 for consultative assistance with his diabetes management.    HPI: Kyle [name] was    Diagnosed with DM *** Prior Medications tried/Intolerance: *** Currently checking blood sugars *** x / day,  before breakfast and ***.  Hypoglycemia episodes : ***               Symptoms: ***                 Frequency: ***/  Hemoglobin A1c has ranged from *** in ***, peaking at *** in ***. Patient required assistance for hypoglycemia:  Patient has required hospitalization within the last 1 year from hyper or hypoglycemia:   In terms of diet, the patient ***   HOME DIABETES REGIMEN: Basal: ***  Bolus: ***   Statin: {Yes/No:11203} ACE-I/ARB: {YES/NO:17245} Prior Diabetic Education: {Yes/No:11203}   METER DOWNLOAD SUMMARY: Date range evaluated: *** Fingerstick Blood Glucose Tests = *** Average Number Tests/Day = *** Overall Mean FS Glucose = *** Standard Deviation = ***  BG Ranges: Low = *** High = ***   Hypoglycemic Events/30 Days: BG < 50 = *** Episodes of symptomatic severe hypoglycemia = ***   DIABETIC COMPLICATIONS: Microvascular complications:   ***  Denies: ***  Last eye exam: Completed   Macrovascular complications:   ***  Denies: CAD, PVD, CVA   PAST HISTORY: Past Medical History:  Past Medical History:  Diagnosis Date  . Arthritis   . Coronary artery disease    minimal CAD '12; NL stress test 06/10/14 ([facility])  . Depression   . Diabetes mellitus without complication ([facility])   . Dyspnea    W/ EXERTION  PT STATES THEY FOUND SPOT ON LUNG SEVERAL  YEARS AGO   . Family history of adverse reaction to anesthesia    "it made my son sick"  . GERD (gastroesophageal reflux disease)   . Hyperlipemia   . Hypertension   . Myocardial infarction    2012; hospitalized [facility] for chest pain syndrome 02/2011 and LHC showed trivial CAD  . Pneumonia 08/2016   surgery had to be rescheduled due to pneumonia  . Psoriasis     Past Surgical History:  Past Surgical History:  Procedure Laterality Date  . CARDIAC CATHETERIZATION     03/28/11 LHC: NL LM, LAD; 10% mCX, mRCA. EF 65%. (Dr. Marijo File, [facility])  . CHOLECYSTECTOMY    . CORONARY ANGIOPLASTY     2012 [facility]   . ESOPHAGEAL DILATION     SEVERAL TIMES  . Heel Spur Resection with Fasiotomy Left 11/25/2013   @ [facility]  . KNEE ARTHROSCOPY Bilateral   . SHOULDER ARTHROSCOPY WITH OPEN ROTATOR CUFF REPAIR Right   . TOTAL KNEE ARTHROPLASTY Right 10/04/2015   Procedure: RIGHT TOTAL KNEE ARTHROPLASTY;  Surgeon: Frederik Pear, MD;  Location: [facility];  Service: Orthopedics;  Laterality: Right;  . TOTAL KNEE ARTHROPLASTY Left 10/14/2016  . TOTAL KNEE ARTHROPLASTY Left 10/14/2016   Procedure: LEFT TOTAL KNEE ARTHROPLASTY;  Surgeon: Frederik Pear, MD;  Location: [facility];  Service: Orthopedics;  Laterality: Left;  . TUMOR REMOVAL  FATTY TUMOR  RT SHOULDER      Social History:  reports that he has never smoked. He has never used smokeless tobacco. He reports that he does not drink alcohol or use drugs.  Family History: No family history on file.   HOME MEDICATIONS: Allergies as of 07/12/2019      Reactions   Cymbalta [duloxetine Hcl] Other (See Comments)   "makes me crazy" depression   Penicillins Hives, Swelling   Has patient had a PCN reaction causing immediate rash, facial/tongue/throat swelling, SOB or lightheadedness with hypotension: No Has patient had a PCN reaction causing severe rash involving mucus membranes or skin necrosis: No Has patient had a PCN reaction that required hospitalization No Has  patient had a PCN reaction occurring within the last 10 years: No If all of the above answers are "NO", then may proceed with Cephalosporin use.      Medication List       Accurate as of July 12, 2019  9:16 AM. If you have any questions, ask your nurse or doctor.        amLODipine 5 MG tablet Commonly known as: NORVASC Take 5 mg by mouth daily.   aspirin EC 325 MG tablet Take 1 tablet (325 mg total) by mouth 2 (two) times daily.   Basaglar KwikPen 100 UNIT/ML Sopn Inject 16 Units into the skin at bedtime. INSTEAD OF LANTUS   Bydureon 2 MG Pen Generic drug: Exenatide ER Inject 2 mg into the skin See admin instructions. Up to twice a week (patient varies usage based on blood sugar readings)   calcipotriene-betamethasone ointment Commonly known as: TACLONEX Apply 1 application topically 2 (two) times daily as needed. For psoriasis.   furosemide 20 MG tablet Commonly known as: LASIX Take 20 mg by mouth daily.   gabapentin 300 MG capsule Commonly known as: NEURONTIN Take 900 mg by mouth 3 (three) times daily.   glipiZIDE 10 MG tablet Commonly known as: GLUCOTROL Take 10 mg by mouth 2 (two) times daily.   lisinopril 10 MG tablet Commonly known as: ZESTRIL Take 10 mg by mouth daily.   metFORMIN 500 MG tablet Commonly known as: GLUCOPHAGE Take 1,000 mg by mouth 2 (two) times daily.   methocarbamol 500 MG tablet Commonly known as: Robaxin Take 1 tablet (500 mg total) by mouth 2 (two) times daily with a meal.   metoprolol succinate 25 MG 24 hr tablet Commonly known as: TOPROL-XL Take 25 mg by mouth daily.   nitroGLYCERIN 0.4 MG SL tablet Commonly known as: NITROSTAT Place 0.4 mg under the tongue every 5 (five) minutes as needed for chest pain.   omeprazole 40 MG capsule Commonly known as: PRILOSEC Take 40 mg by mouth daily.   oxyCODONE-acetaminophen 5-325 MG tablet Commonly known as: Roxicet Take 1-2 tablets by mouth every 4 (four) hours as needed.     PARoxetine 40 MG tablet Commonly known as: PAXIL Take 40 mg by mouth daily.   rosuvastatin 20 MG tablet Commonly known as: CRESTOR Take 20 mg by mouth at bedtime.   traZODone 50 MG tablet Commonly known as: DESYREL Take 50-100 mg by mouth at bedtime.        ALLERGIES: Allergies  Allergen Reactions  . Cymbalta [Duloxetine Hcl] Other (See Comments)    "makes me crazy" depression  . Penicillins Hives and Swelling    Has patient had a PCN reaction causing immediate rash, facial/tongue/throat swelling, SOB or lightheadedness with hypotension: No Has patient had a PCN reaction causing  severe rash involving mucus membranes or skin necrosis: No Has patient had a PCN reaction that required hospitalization No Has patient had a PCN reaction occurring within the last 10 years: No If all of the above answers are "NO", then may proceed with Cephalosporin use.      REVIEW OF SYSTEMS: A comprehensive ROS was conducted with the patient and is negative except as per HPI and below:  ROS    OBJECTIVE:   VITAL SIGNS: There were no vitals taken for this visit.   PHYSICAL EXAM:  General: Pt appears well and is in NAD  Hydration: Well-hydrated with moist mucous membranes and good skin turgor  HEENT: Head: Unremarkable with good dentition. Oropharynx clear without exudate.  Eyes: External eye exam normal without stare, lid lag or exophthalmos.  EOM intact.  PERRL.  Neck: General: Supple without adenopathy or carotid bruits. Thyroid: Thyroid size normal.  No goiter or nodules appreciated. No thyroid bruit.  Lungs: Clear with good BS bilat with no rales, rhonchi, or wheezes  Heart: RRR with normal S1 and S2 and no gallops; no murmurs; no rub  Abdomen: Normoactive bowel sounds, soft, nontender, without masses or organomegaly palpable  Extremities:  Lower extremities - No pretibial edema. No lesions.  Skin: Normal texture and temperature to palpation. No rash noted. No Acanthosis  nigricans/skin tags. No lipohypertrophy.  Neuro: MS is good with appropriate affect, pt is alert and Ox3    DM foot exam:    DATA REVIEWED:  Lab Results  Component Value Date   HGBA1C 9.2 (H) 10/14/2016   HGBA1C 8.8 (H) 09/03/2016   HGBA1C 7.8 (H) 10/04/2015   Lab Results  Component Value Date   CREATININE 1.52 (H) 10/15/2016    05/11/2019 A1c > 14.0%    01/12/2019 TG 330 LDL 21  TSH 1.910  ASSESSMENT / PLAN / RECOMMENDATIONS:   1) Type *** Diabetes Mellitus, ***controlled, With*** complications - Most recent A1c of *** %. Goal A1c < *** %.  ***  Plan: GENERAL:  ***  MEDICATIONS:  ***  EDUCATION / INSTRUCTIONS:  BG monitoring instructions: Patient is instructed to check his blood sugars *** times a day, ***.  Call [facility] if: BG persistently < 70 or > 300. . I reviewed the Rule of 15 for the treatment of hypoglycemia in detail with the patient. Literature supplied.   2) Diabetic complications:   Eye: Does *** have known diabetic retinopathy.   Neuro/ Feet: Does *** have known diabetic peripheral neuropathy.  Renal: Patient does *** have known baseline CKD. He is *** on an ACEI/ARB at present.Check urine albumin/creatinine ratio yearly starting at time of diagnosis. If albuminuria is positive, treatment is geared toward better glucose, blood pressure control and use of ACE inhibitors or ARBs. Monitor electrolytes and creatinine once to twice yearly.   3) Lipids: Patient is *** on a statin.    4) Hypertension: ***  at goal of < 140/90 mmHg.       Signed electronically by: Lyndle Herrlich, MD  [facility] Endocrinology  [facility] [address], [address] [place], [place] [address] Phone: [phone] FAX: [phone]   CC: Treasa School, PA-C [address] [place] [address] Phone: [phone]  Fax: [phone]    Return to Endocrinology clinic as below: Future Appointments  Date  Time Provider Department Center  07/12/2019  1:00 PM Muhsin Doris, Konrad Dolores, MD [id number] None

## 2019-08-02 ENCOUNTER — Other Ambulatory Visit: Payer: Self-pay

## 2019-08-03 ENCOUNTER — Ambulatory Visit: Payer: Medicare Other | Admitting: Internal Medicine

## 2019-08-19 DIAGNOSIS — E1165 Type 2 diabetes mellitus with hyperglycemia: Secondary | ICD-10-CM | POA: Diagnosis not present

## 2019-08-19 DIAGNOSIS — E119 Type 2 diabetes mellitus without complications: Secondary | ICD-10-CM | POA: Diagnosis not present

## 2019-08-19 DIAGNOSIS — K219 Gastro-esophageal reflux disease without esophagitis: Secondary | ICD-10-CM | POA: Diagnosis not present

## 2019-08-19 DIAGNOSIS — R300 Dysuria: Secondary | ICD-10-CM | POA: Diagnosis not present

## 2019-08-19 DIAGNOSIS — E782 Mixed hyperlipidemia: Secondary | ICD-10-CM | POA: Diagnosis not present

## 2019-08-19 DIAGNOSIS — N400 Enlarged prostate without lower urinary tract symptoms: Secondary | ICD-10-CM | POA: Diagnosis not present

## 2019-08-19 DIAGNOSIS — N390 Urinary tract infection, site not specified: Secondary | ICD-10-CM | POA: Diagnosis not present

## 2019-08-19 DIAGNOSIS — I10 Essential (primary) hypertension: Secondary | ICD-10-CM | POA: Diagnosis not present

## 2019-08-24 ENCOUNTER — Ambulatory Visit: Payer: Medicare Other | Admitting: Internal Medicine

## 2019-09-22 DIAGNOSIS — Z1331 Encounter for screening for depression: Secondary | ICD-10-CM | POA: Diagnosis not present

## 2019-09-22 DIAGNOSIS — Z139 Encounter for screening, unspecified: Secondary | ICD-10-CM | POA: Diagnosis not present

## 2019-09-22 DIAGNOSIS — Z9181 History of falling: Secondary | ICD-10-CM | POA: Diagnosis not present

## 2019-10-27 DIAGNOSIS — E1165 Type 2 diabetes mellitus with hyperglycemia: Secondary | ICD-10-CM | POA: Diagnosis not present

## 2019-10-27 DIAGNOSIS — M25511 Pain in right shoulder: Secondary | ICD-10-CM | POA: Diagnosis not present

## 2019-10-27 DIAGNOSIS — I10 Essential (primary) hypertension: Secondary | ICD-10-CM | POA: Diagnosis not present

## 2019-10-27 DIAGNOSIS — R29898 Other symptoms and signs involving the musculoskeletal system: Secondary | ICD-10-CM | POA: Diagnosis not present

## 2019-10-28 DIAGNOSIS — M25511 Pain in right shoulder: Secondary | ICD-10-CM | POA: Diagnosis not present

## 2019-11-01 DIAGNOSIS — Z23 Encounter for immunization: Secondary | ICD-10-CM | POA: Diagnosis not present

## 2019-11-29 DIAGNOSIS — Z23 Encounter for immunization: Secondary | ICD-10-CM | POA: Diagnosis not present

## 2020-01-20 DIAGNOSIS — E1165 Type 2 diabetes mellitus with hyperglycemia: Secondary | ICD-10-CM | POA: Diagnosis not present

## 2020-01-20 DIAGNOSIS — R202 Paresthesia of skin: Secondary | ICD-10-CM | POA: Diagnosis not present

## 2020-01-20 DIAGNOSIS — R29898 Other symptoms and signs involving the musculoskeletal system: Secondary | ICD-10-CM | POA: Diagnosis not present

## 2020-01-20 DIAGNOSIS — I10 Essential (primary) hypertension: Secondary | ICD-10-CM | POA: Diagnosis not present

## 2020-02-23 DIAGNOSIS — I10 Essential (primary) hypertension: Secondary | ICD-10-CM | POA: Diagnosis not present

## 2020-02-23 DIAGNOSIS — G4730 Sleep apnea, unspecified: Secondary | ICD-10-CM | POA: Diagnosis not present

## 2020-02-23 DIAGNOSIS — E1165 Type 2 diabetes mellitus with hyperglycemia: Secondary | ICD-10-CM | POA: Diagnosis not present

## 2020-02-23 DIAGNOSIS — E669 Obesity, unspecified: Secondary | ICD-10-CM | POA: Diagnosis not present

## 2020-03-21 DIAGNOSIS — K029 Dental caries, unspecified: Secondary | ICD-10-CM | POA: Diagnosis not present

## 2020-03-21 DIAGNOSIS — I10 Essential (primary) hypertension: Secondary | ICD-10-CM | POA: Diagnosis not present

## 2020-03-21 DIAGNOSIS — K0889 Other specified disorders of teeth and supporting structures: Secondary | ICD-10-CM | POA: Diagnosis not present

## 2020-03-21 DIAGNOSIS — E1165 Type 2 diabetes mellitus with hyperglycemia: Secondary | ICD-10-CM | POA: Diagnosis not present

## 2020-04-14 DIAGNOSIS — Z79899 Other long term (current) drug therapy: Secondary | ICD-10-CM | POA: Diagnosis not present

## 2020-04-20 DIAGNOSIS — E1165 Type 2 diabetes mellitus with hyperglycemia: Secondary | ICD-10-CM | POA: Diagnosis not present

## 2020-04-20 DIAGNOSIS — I10 Essential (primary) hypertension: Secondary | ICD-10-CM | POA: Diagnosis not present

## 2020-04-20 DIAGNOSIS — E782 Mixed hyperlipidemia: Secondary | ICD-10-CM | POA: Diagnosis not present

## 2020-04-20 DIAGNOSIS — L409 Psoriasis, unspecified: Secondary | ICD-10-CM | POA: Diagnosis not present

## 2020-04-20 DIAGNOSIS — E669 Obesity, unspecified: Secondary | ICD-10-CM | POA: Diagnosis not present

## 2020-04-20 DIAGNOSIS — E119 Type 2 diabetes mellitus without complications: Secondary | ICD-10-CM | POA: Diagnosis not present

## 2020-04-28 DIAGNOSIS — F411 Generalized anxiety disorder: Secondary | ICD-10-CM | POA: Diagnosis not present

## 2020-04-28 DIAGNOSIS — F1121 Opioid dependence, in remission: Secondary | ICD-10-CM | POA: Diagnosis not present

## 2020-04-28 DIAGNOSIS — Z6838 Body mass index (BMI) 38.0-38.9, adult: Secondary | ICD-10-CM | POA: Diagnosis not present

## 2020-04-28 DIAGNOSIS — Z79899 Other long term (current) drug therapy: Secondary | ICD-10-CM | POA: Diagnosis not present

## 2020-05-01 DIAGNOSIS — N390 Urinary tract infection, site not specified: Secondary | ICD-10-CM | POA: Diagnosis not present

## 2020-05-01 DIAGNOSIS — I10 Essential (primary) hypertension: Secondary | ICD-10-CM | POA: Diagnosis not present

## 2020-05-01 DIAGNOSIS — R350 Frequency of micturition: Secondary | ICD-10-CM | POA: Diagnosis not present

## 2020-05-01 DIAGNOSIS — R82998 Other abnormal findings in urine: Secondary | ICD-10-CM | POA: Diagnosis not present

## 2020-05-01 DIAGNOSIS — R300 Dysuria: Secondary | ICD-10-CM | POA: Diagnosis not present

## 2020-05-01 DIAGNOSIS — E1165 Type 2 diabetes mellitus with hyperglycemia: Secondary | ICD-10-CM | POA: Diagnosis not present

## 2020-05-12 DIAGNOSIS — Z79899 Other long term (current) drug therapy: Secondary | ICD-10-CM | POA: Diagnosis not present

## 2020-05-12 DIAGNOSIS — F1121 Opioid dependence, in remission: Secondary | ICD-10-CM | POA: Diagnosis not present

## 2020-05-12 DIAGNOSIS — Z6838 Body mass index (BMI) 38.0-38.9, adult: Secondary | ICD-10-CM | POA: Diagnosis not present

## 2020-05-12 DIAGNOSIS — F411 Generalized anxiety disorder: Secondary | ICD-10-CM | POA: Diagnosis not present

## 2020-05-26 DIAGNOSIS — Z6838 Body mass index (BMI) 38.0-38.9, adult: Secondary | ICD-10-CM | POA: Diagnosis not present

## 2020-05-26 DIAGNOSIS — F1121 Opioid dependence, in remission: Secondary | ICD-10-CM | POA: Diagnosis not present

## 2020-05-26 DIAGNOSIS — F411 Generalized anxiety disorder: Secondary | ICD-10-CM | POA: Diagnosis not present

## 2020-05-26 DIAGNOSIS — Z79899 Other long term (current) drug therapy: Secondary | ICD-10-CM | POA: Diagnosis not present

## 2020-06-16 DIAGNOSIS — Z79899 Other long term (current) drug therapy: Secondary | ICD-10-CM | POA: Diagnosis not present

## 2020-06-16 DIAGNOSIS — F411 Generalized anxiety disorder: Secondary | ICD-10-CM | POA: Diagnosis not present

## 2020-06-16 DIAGNOSIS — F1121 Opioid dependence, in remission: Secondary | ICD-10-CM | POA: Diagnosis not present

## 2020-06-16 DIAGNOSIS — Z6838 Body mass index (BMI) 38.0-38.9, adult: Secondary | ICD-10-CM | POA: Diagnosis not present

## 2020-06-28 DIAGNOSIS — R5381 Other malaise: Secondary | ICD-10-CM | POA: Insufficient documentation

## 2020-06-28 DIAGNOSIS — F321 Major depressive disorder, single episode, moderate: Secondary | ICD-10-CM | POA: Diagnosis not present

## 2020-06-28 DIAGNOSIS — E782 Mixed hyperlipidemia: Secondary | ICD-10-CM | POA: Insufficient documentation

## 2020-06-28 DIAGNOSIS — E119 Type 2 diabetes mellitus without complications: Secondary | ICD-10-CM | POA: Diagnosis not present

## 2020-06-28 DIAGNOSIS — M5137 Other intervertebral disc degeneration, lumbosacral region: Secondary | ICD-10-CM | POA: Diagnosis not present

## 2020-06-28 DIAGNOSIS — I10 Essential (primary) hypertension: Secondary | ICD-10-CM | POA: Diagnosis not present

## 2020-06-28 DIAGNOSIS — Z794 Long term (current) use of insulin: Secondary | ICD-10-CM | POA: Diagnosis not present

## 2020-06-28 DIAGNOSIS — R5383 Other fatigue: Secondary | ICD-10-CM | POA: Diagnosis not present

## 2020-06-28 HISTORY — DX: Other malaise: R53.81

## 2020-06-30 DIAGNOSIS — F411 Generalized anxiety disorder: Secondary | ICD-10-CM | POA: Diagnosis not present

## 2020-06-30 DIAGNOSIS — F1121 Opioid dependence, in remission: Secondary | ICD-10-CM | POA: Diagnosis not present

## 2020-06-30 DIAGNOSIS — Z6838 Body mass index (BMI) 38.0-38.9, adult: Secondary | ICD-10-CM | POA: Diagnosis not present

## 2020-06-30 DIAGNOSIS — Z79899 Other long term (current) drug therapy: Secondary | ICD-10-CM | POA: Diagnosis not present

## 2020-07-14 DIAGNOSIS — F1121 Opioid dependence, in remission: Secondary | ICD-10-CM | POA: Diagnosis not present

## 2020-07-14 DIAGNOSIS — F411 Generalized anxiety disorder: Secondary | ICD-10-CM | POA: Diagnosis not present

## 2020-07-14 DIAGNOSIS — Z79899 Other long term (current) drug therapy: Secondary | ICD-10-CM | POA: Diagnosis not present

## 2020-07-14 DIAGNOSIS — Z6836 Body mass index (BMI) 36.0-36.9, adult: Secondary | ICD-10-CM | POA: Diagnosis not present

## 2020-07-17 DIAGNOSIS — L03211 Cellulitis of face: Secondary | ICD-10-CM | POA: Diagnosis not present

## 2020-07-17 DIAGNOSIS — E1165 Type 2 diabetes mellitus with hyperglycemia: Secondary | ICD-10-CM | POA: Diagnosis not present

## 2020-07-27 DIAGNOSIS — F1121 Opioid dependence, in remission: Secondary | ICD-10-CM | POA: Diagnosis not present

## 2020-07-27 DIAGNOSIS — Z79899 Other long term (current) drug therapy: Secondary | ICD-10-CM | POA: Diagnosis not present

## 2020-07-27 DIAGNOSIS — F411 Generalized anxiety disorder: Secondary | ICD-10-CM | POA: Diagnosis not present

## 2020-07-27 DIAGNOSIS — Z6836 Body mass index (BMI) 36.0-36.9, adult: Secondary | ICD-10-CM | POA: Diagnosis not present

## 2020-08-11 DIAGNOSIS — Z6836 Body mass index (BMI) 36.0-36.9, adult: Secondary | ICD-10-CM | POA: Diagnosis not present

## 2020-08-11 DIAGNOSIS — F1121 Opioid dependence, in remission: Secondary | ICD-10-CM | POA: Diagnosis not present

## 2020-08-11 DIAGNOSIS — Z79899 Other long term (current) drug therapy: Secondary | ICD-10-CM | POA: Diagnosis not present

## 2020-08-11 DIAGNOSIS — F411 Generalized anxiety disorder: Secondary | ICD-10-CM | POA: Diagnosis not present

## 2020-09-29 DIAGNOSIS — K047 Periapical abscess without sinus: Secondary | ICD-10-CM | POA: Diagnosis not present

## 2020-09-29 DIAGNOSIS — K029 Dental caries, unspecified: Secondary | ICD-10-CM | POA: Diagnosis not present

## 2020-10-25 DIAGNOSIS — L400 Psoriasis vulgaris: Secondary | ICD-10-CM | POA: Diagnosis not present

## 2020-11-01 DIAGNOSIS — J4520 Mild intermittent asthma, uncomplicated: Secondary | ICD-10-CM | POA: Insufficient documentation

## 2020-11-01 DIAGNOSIS — M5136 Other intervertebral disc degeneration, lumbar region: Secondary | ICD-10-CM

## 2020-11-01 DIAGNOSIS — F419 Anxiety disorder, unspecified: Secondary | ICD-10-CM | POA: Insufficient documentation

## 2020-11-01 DIAGNOSIS — N401 Enlarged prostate with lower urinary tract symptoms: Secondary | ICD-10-CM | POA: Insufficient documentation

## 2020-11-01 DIAGNOSIS — M8949 Other hypertrophic osteoarthropathy, multiple sites: Secondary | ICD-10-CM | POA: Insufficient documentation

## 2020-11-01 DIAGNOSIS — M150 Primary generalized (osteo)arthritis: Secondary | ICD-10-CM

## 2020-11-01 DIAGNOSIS — E119 Type 2 diabetes mellitus without complications: Secondary | ICD-10-CM | POA: Diagnosis not present

## 2020-11-01 DIAGNOSIS — I25118 Atherosclerotic heart disease of native coronary artery with other forms of angina pectoris: Secondary | ICD-10-CM | POA: Insufficient documentation

## 2020-11-01 DIAGNOSIS — Z794 Long term (current) use of insulin: Secondary | ICD-10-CM | POA: Diagnosis not present

## 2020-11-01 DIAGNOSIS — D649 Anemia, unspecified: Secondary | ICD-10-CM | POA: Diagnosis not present

## 2020-11-01 DIAGNOSIS — F1120 Opioid dependence, uncomplicated: Secondary | ICD-10-CM | POA: Insufficient documentation

## 2020-11-01 DIAGNOSIS — I10 Essential (primary) hypertension: Secondary | ICD-10-CM | POA: Diagnosis not present

## 2020-11-01 DIAGNOSIS — L409 Psoriasis, unspecified: Secondary | ICD-10-CM | POA: Insufficient documentation

## 2020-11-01 DIAGNOSIS — M51369 Other intervertebral disc degeneration, lumbar region without mention of lumbar back pain or lower extremity pain: Secondary | ICD-10-CM | POA: Insufficient documentation

## 2020-11-01 DIAGNOSIS — R350 Frequency of micturition: Secondary | ICD-10-CM | POA: Insufficient documentation

## 2020-11-01 DIAGNOSIS — F331 Major depressive disorder, recurrent, moderate: Secondary | ICD-10-CM | POA: Insufficient documentation

## 2020-11-01 DIAGNOSIS — E782 Mixed hyperlipidemia: Secondary | ICD-10-CM | POA: Diagnosis not present

## 2020-11-01 DIAGNOSIS — M159 Polyosteoarthritis, unspecified: Secondary | ICD-10-CM

## 2020-11-01 HISTORY — DX: Other intervertebral disc degeneration, lumbar region: M51.36

## 2020-11-01 HISTORY — DX: Primary generalized (osteo)arthritis: M15.0

## 2020-11-01 HISTORY — DX: Polyosteoarthritis, unspecified: M15.9

## 2020-11-01 HISTORY — DX: Other hypertrophic osteoarthropathy, multiple sites: M89.49

## 2020-11-01 HISTORY — DX: Other intervertebral disc degeneration, lumbar region without mention of lumbar back pain or lower extremity pain: M51.369

## 2020-11-02 DIAGNOSIS — E538 Deficiency of other specified B group vitamins: Secondary | ICD-10-CM | POA: Insufficient documentation

## 2020-11-02 HISTORY — DX: Deficiency of other specified B group vitamins: E53.8

## 2020-11-06 ENCOUNTER — Encounter: Payer: Self-pay | Admitting: *Deleted

## 2020-11-06 ENCOUNTER — Encounter: Payer: Self-pay | Admitting: Cardiology

## 2020-11-07 DIAGNOSIS — D500 Iron deficiency anemia secondary to blood loss (chronic): Secondary | ICD-10-CM

## 2020-11-07 HISTORY — DX: Iron deficiency anemia secondary to blood loss (chronic): D50.0

## 2020-11-23 DIAGNOSIS — E785 Hyperlipidemia, unspecified: Secondary | ICD-10-CM | POA: Diagnosis not present

## 2020-11-23 DIAGNOSIS — L400 Psoriasis vulgaris: Secondary | ICD-10-CM | POA: Diagnosis not present

## 2020-11-23 DIAGNOSIS — R531 Weakness: Secondary | ICD-10-CM | POA: Diagnosis not present

## 2020-11-24 DIAGNOSIS — L400 Psoriasis vulgaris: Secondary | ICD-10-CM | POA: Diagnosis not present

## 2020-11-27 DIAGNOSIS — R748 Abnormal levels of other serum enzymes: Secondary | ICD-10-CM

## 2020-11-27 HISTORY — DX: Abnormal levels of other serum enzymes: R74.8

## 2020-12-06 DIAGNOSIS — F331 Major depressive disorder, recurrent, moderate: Secondary | ICD-10-CM | POA: Insufficient documentation

## 2020-12-06 DIAGNOSIS — D649 Anemia, unspecified: Secondary | ICD-10-CM | POA: Insufficient documentation

## 2020-12-06 DIAGNOSIS — M5136 Other intervertebral disc degeneration, lumbar region: Secondary | ICD-10-CM | POA: Insufficient documentation

## 2020-12-06 DIAGNOSIS — I25118 Atherosclerotic heart disease of native coronary artery with other forms of angina pectoris: Secondary | ICD-10-CM | POA: Insufficient documentation

## 2020-12-06 DIAGNOSIS — L409 Psoriasis, unspecified: Secondary | ICD-10-CM | POA: Insufficient documentation

## 2020-12-06 DIAGNOSIS — M51369 Other intervertebral disc degeneration, lumbar region without mention of lumbar back pain or lower extremity pain: Secondary | ICD-10-CM | POA: Insufficient documentation

## 2020-12-06 DIAGNOSIS — E119 Type 2 diabetes mellitus without complications: Secondary | ICD-10-CM | POA: Insufficient documentation

## 2020-12-06 DIAGNOSIS — M1990 Unspecified osteoarthritis, unspecified site: Secondary | ICD-10-CM | POA: Insufficient documentation

## 2020-12-06 DIAGNOSIS — I219 Acute myocardial infarction, unspecified: Secondary | ICD-10-CM | POA: Insufficient documentation

## 2020-12-06 DIAGNOSIS — E782 Mixed hyperlipidemia: Secondary | ICD-10-CM | POA: Insufficient documentation

## 2020-12-06 DIAGNOSIS — Z8489 Family history of other specified conditions: Secondary | ICD-10-CM | POA: Insufficient documentation

## 2020-12-06 DIAGNOSIS — F419 Anxiety disorder, unspecified: Secondary | ICD-10-CM | POA: Insufficient documentation

## 2020-12-06 DIAGNOSIS — K219 Gastro-esophageal reflux disease without esophagitis: Secondary | ICD-10-CM | POA: Insufficient documentation

## 2020-12-06 DIAGNOSIS — N401 Enlarged prostate with lower urinary tract symptoms: Secondary | ICD-10-CM | POA: Insufficient documentation

## 2020-12-06 DIAGNOSIS — I2510 Atherosclerotic heart disease of native coronary artery without angina pectoris: Secondary | ICD-10-CM | POA: Insufficient documentation

## 2020-12-06 DIAGNOSIS — J4520 Mild intermittent asthma, uncomplicated: Secondary | ICD-10-CM | POA: Insufficient documentation

## 2020-12-06 DIAGNOSIS — F32A Depression, unspecified: Secondary | ICD-10-CM | POA: Insufficient documentation

## 2020-12-06 DIAGNOSIS — F1120 Opioid dependence, uncomplicated: Secondary | ICD-10-CM | POA: Insufficient documentation

## 2020-12-06 DIAGNOSIS — E785 Hyperlipidemia, unspecified: Secondary | ICD-10-CM | POA: Insufficient documentation

## 2020-12-18 ENCOUNTER — Encounter: Payer: Self-pay | Admitting: Cardiology

## 2020-12-18 ENCOUNTER — Other Ambulatory Visit: Payer: Self-pay

## 2020-12-18 ENCOUNTER — Ambulatory Visit (INDEPENDENT_AMBULATORY_CARE_PROVIDER_SITE_OTHER): Payer: BC Managed Care – PPO | Admitting: Cardiology

## 2020-12-18 VITALS — BP 118/68 | HR 71 | Ht 68.0 in | Wt 237.2 lb

## 2020-12-18 DIAGNOSIS — I10 Essential (primary) hypertension: Secondary | ICD-10-CM

## 2020-12-18 DIAGNOSIS — E119 Type 2 diabetes mellitus without complications: Secondary | ICD-10-CM

## 2020-12-18 DIAGNOSIS — I25118 Atherosclerotic heart disease of native coronary artery with other forms of angina pectoris: Secondary | ICD-10-CM | POA: Diagnosis not present

## 2020-12-18 DIAGNOSIS — E782 Mixed hyperlipidemia: Secondary | ICD-10-CM

## 2020-12-18 DIAGNOSIS — I219 Acute myocardial infarction, unspecified: Secondary | ICD-10-CM

## 2020-12-18 DIAGNOSIS — Z794 Long term (current) use of insulin: Secondary | ICD-10-CM

## 2020-12-18 MED ORDER — NITROGLYCERIN 0.4 MG SL SUBL: 0.4000 mg | SUBLINGUAL_TABLET | SUBLINGUAL | 6 refills | Status: DC | PRN

## 2020-12-18 MED ORDER — RANOLAZINE ER 500 MG PO TB12: 500.0000 mg | ORAL_TABLET | Freq: Two times a day (BID) | ORAL | 3 refills | Status: DC

## 2020-12-18 NOTE — Patient Instructions (Signed)
Medication Instructions:  Your physician has recommended you make the following change in your medication:   Use nitroglycerin 1 tablet placed under the tongue at the first sign of chest pain or an angina attack. 1 tablet may be used every 5 minutes as needed, for up to 15 minutes. Do not take more than 3 tablets in 15 minutes. If pain persist call 911 or go to the nearest ED.  Start Ranexa 500 mg twice daily.  *If you need a refill on your cardiac medications before your next appointment, please call your pharmacy*   Lab Work: None ordered If you have labs (blood work) drawn today and your tests are completely normal, you will receive your results only by: Marland Kitchen. MyChart Message (if you have MyChart) OR . A paper copy in the mail If you have any lab test that is abnormal or we need to change your treatment, we will call you to review the results.   Testing/Procedures: Your physician has requested that you have a lexiscan myoview. For further information please visit [email or web address]. Please follow instruction sheet, as given.  The test will take approximately 3 to 4 hours to complete; you may bring reading material.  If someone comes with you to your appointment, they will need to remain in the main lobby due to limited space in the testing area. **If you are pregnant or breastfeeding, please notify the nuclear lab prior to your appointment**  How to prepare for your Myocardial Perfusion Test: . Do not eat or drink 3 hours prior to your test, except you may have water. . Do not consume products containing caffeine (regular or decaffeinated) 12 hours prior to your test. (ex: coffee, chocolate, sodas, tea). . Do bring a list of your current medications with you.  If not listed below, you may take your medications as normal. . Do wear comfortable clothes (no dresses or overalls) and walking shoes, tennis shoes preferred (No heels or open toe shoes are allowed). . Do NOT wear cologne, perfume,  aftershave, or lotions (deodorant is allowed). . If these instructions are not followed, your test will have to be rescheduled.  Your physician has requested that you have an echocardiogram. Echocardiography is a painless test that uses sound waves to create images of your heart. It provides your doctor with information about the size and shape of your heart and how well your heart's chambers and valves are working. This procedure takes approximately one hour. There are no restrictions for this procedure.   Follow-Up: At [facility], you and your health needs are our priority.  As part of our continuing mission to provide you with exceptional heart care, we have created designated Provider Care Teams.  These Care Teams include your primary Cardiologist (physician) and Advanced Practice Providers (APPs -  Physician Assistants and Nurse Practitioners) who all work together to provide you with the care you need, when you need it.  We recommend signing up for the patient portal called "MyChart".  Sign up information is provided on this After Visit Summary.  MyChart is used to connect with patients for Virtual Visits (Telemedicine).  Patients are able to view lab/test results, encounter notes, upcoming appointments, etc.  Non-urgent messages can be sent to your provider as well.   To learn more about what you can do with MyChart, go to [email or web address].    Your next appointment:   1 month(s)  The format for your next appointment:   In Person  Provider:   Belva Cromeajan Revankar,  MD   Other Instructions Cardiac Nuclear Scan A cardiac nuclear scan is a test that is done to check the flow of blood to your heart. It is done when you are resting and when you are exercising. The test looks for problems such as:  Not enough blood reaching a portion of the heart.  The heart muscle not working as it should. You may need this test if:  You have heart disease.  You have had lab results that are not  normal.  You have had heart surgery or a balloon procedure to open up blocked arteries (angioplasty).  You have chest pain.  You have shortness of breath. In this test, a special dye (tracer) is put into your bloodstream. The tracer will travel to your heart. A camera will then take pictures of your heart to see how the tracer moves through your heart. This test is usually done at a hospital and takes 2-4 hours. Tell a doctor about:  Any allergies you have.  All medicines you are taking, including vitamins, herbs, eye drops, creams, and over-the-counter medicines.  Any problems you or family members have had with anesthetic medicines.  Any blood disorders you have.  Any surgeries you have had.  Any medical conditions you have.  Whether you are pregnant or may be pregnant. What are the risks? Generally, this is a safe test. However, problems may occur, such as:  Serious chest pain and heart attack. This is only a risk if the stress portion of the test is done.  Rapid heartbeat.  A feeling of warmth in your chest. This feeling usually does not last long.  Allergic reaction to the tracer. What happens before the test?  Ask your doctor about changing or stopping your normal medicines. This is important.  Follow instructions from your doctor about what you cannot eat or drink.  Remove your jewelry on the day of the test. What happens during the test? 1. An IV tube will be inserted into one of your veins. 2. Your doctor will give you a small amount of tracer through the IV tube. 3. You will wait for 20-40 minutes while the tracer moves through your bloodstream. 4. Your heart will be monitored with an electrocardiogram (ECG). 5. You will lie down on an exam table. 6. Pictures of your heart will be taken for about 15-20 minutes. 7. You may also have a stress test. For this test, one of these things may be done: ? You will be asked to exercise on a treadmill or a stationary  bike. ? You will be given medicines that will make your heart work harder. This is done if you are unable to exercise. 8. When blood flow to your heart has peaked, a tracer will again be given through the IV tube. 9. After 20-40 minutes, you will get back on the exam table. More pictures will be taken of your heart. 10. Depending on the tracer that is used, more pictures may need to be taken 3-4 hours later. 11. Your IV tube will be removed when the test is over. The test may vary among doctors and hospitals. What happens after the test? 1. Ask your doctor: ? Whether you can return to your normal schedule, including diet, activities, and medicines. ? Whether you should drink more fluids. This will help to remove the tracer from your body. Drink enough fluid to keep your pee (urine) pale yellow. 2. Ask your doctor, or the department that is doing the test: ?  When will my results be ready? ? How will I get my results? Summary  A cardiac nuclear scan is a test that is done to check the flow of blood to your heart.  Tell your doctor whether you are pregnant or may be pregnant.  Before the test, ask your doctor about changing or stopping your normal medicines. This is important.  Ask your doctor whether you can return to your normal activities. You may be asked to drink more fluids. This information is not intended to replace advice given to you by your health care provider. Make sure you discuss any questions you have with your health care provider. Document Revised: 11/04/2018 Document Reviewed: 12/29/2017 Elsevier Patient Education  2021 Elsevier Inc.    Echocardiogram An echocardiogram is a test that uses sound waves (ultrasound) to produce images of the heart. Images from an echocardiogram can provide important information about:  Heart size and shape.  The size and thickness and movement of your heart's walls.  Heart muscle function and strength.  Heart valve function or if  you have stenosis. Stenosis is when the heart valves are too narrow.  If blood is flowing backward through the heart valves (regurgitation).  A tumor or infectious growth around the heart valves.  Areas of heart muscle that are not working well because of poor blood flow or injury from a heart attack.  Aneurysm detection. An aneurysm is a weak or damaged part of an artery wall. The wall bulges out from the normal force of blood pumping through the body. Tell a health care provider about:  Any allergies you have.  All medicines you are taking, including vitamins, herbs, eye drops, creams, and over-the-counter medicines.  Any blood disorders you have.  Any surgeries you have had.  Any medical conditions you have.  Whether you are pregnant or may be pregnant. What are the risks? Generally, this is a safe test. However, problems may occur, including an allergic reaction to dye (contrast) that may be used during the test. What happens before the test? No specific preparation is needed. You may eat and drink normally. What happens during the test?  You will take off your clothes from the waist up and put on a hospital gown.  Electrodes or electrocardiogram (ECG)patches may be placed on your chest. The electrodes or patches are then connected to a device that monitors your heart rate and rhythm.  You will lie down on a table for an ultrasound exam. A gel will be applied to your chest to help sound waves pass through your skin.  A handheld device, called a transducer, will be pressed against your chest and moved over your heart. The transducer produces sound waves that travel to your heart and bounce back (or "echo" back) to the transducer. These sound waves will be captured in real-time and changed into images of your heart that can be viewed on a video monitor. The images will be recorded on a computer and reviewed by your health care provider.  You may be asked to change positions or  hold your breath for a short time. This makes it easier to get different views or better views of your heart.  In some cases, you may receive contrast through an IV in one of your veins. This can improve the quality of the pictures from your heart. The procedure may vary among health care providers and hospitals.    What can I expect after the test? You may return to your normal,  everyday life, including diet, activities, and medicines, unless your health care provider tells you not to do that. Follow these instructions at home:  It is up to you to get the results of your test. Ask your health care provider, or the department that is doing the test, when your results will be ready.  Keep all follow-up visits. This is important. Summary  An echocardiogram is a test that uses sound waves (ultrasound) to produce images of the heart.  Images from an echocardiogram can provide important information about the size and shape of your heart, heart muscle function, heart valve function, and other possible heart problems.  You do not need to do anything to prepare before this test. You may eat and drink normally.  After the echocardiogram is completed, you may return to your normal, everyday life, unless your health care provider tells you not to do that. This information is not intended to replace advice given to you by your health care provider. Make sure you discuss any questions you have with your health care provider. Document Revised: 03/07/2020 Document Reviewed: 03/07/2020 Elsevier Patient Education  2021 Elsevier Inc. Ranolazine tablets, extended release What is this medicine? RANOLAZINE (ra NOE la zeen) is a heart medicine. It is used to treat chronic chest pain (angina). This medicine must be taken regularly. It will not relieve an acute episode of chest pain. This medicine may be used for other purposes; ask your health care provider or pharmacist if you have questions. COMMON BRAND  NAME(S): Ranexa What should I tell my health care provider before I take this medicine? They need to know if you have any of these conditions:  heart disease  irregular heartbeat  kidney disease  liver disease  low levels of potassium or magnesium in the blood  an unusual or allergic reaction to ranolazine, other medicines, foods, dyes, or preservatives  pregnant or trying to get pregnant  breast-feeding How should I use this medicine? Take this medicine by mouth with a glass of water. Follow the directions on the prescription label. Do not cut, crush, or chew this medicine. Take with or without food. Do not take this medication with grapefruit juice. Take your doses at regular intervals. Do not take your medicine more often then directed. Talk to your pediatrician regarding the use of this medicine in children. Special care may be needed. Overdosage: If you think you have taken too much of this medicine contact a poison control center or emergency room at once. NOTE: This medicine is only for you. Do not share this medicine with others. What if I miss a dose? If you miss a dose, take it as soon as you can. If it is almost time for your next dose, take only that dose. Do not take double or extra doses. What may interact with this medicine? Do not take this medicine with any of the following medications:  antivirals for HIV or AIDS  cerivastatin  certain antibiotics like chloramphenicol, clarithromycin, dalfopristin; quinupristin, isoniazid, rifabutin, rifampin, rifapentine  certain medicines used for cancer like imatinib, nilotinib  certain medicines for fungal infections like fluconazole, itraconazole, ketoconazole, posaconazole, voriconazole  certain medicines for irregular heart beat like dronedarone  certain medicines for seizures like carbamazepine, fosphenytoin, oxcarbazepine, phenobarbital, phenytoin  cisapride  conivaptan  cyclosporine  grapefruit or  grapefruit juice  lumacaftor; ivacaftor  nefazodone  pimozide  quinacrine  St John's wort  thioridazine This medicine may also interact with the following medications:  alfuzosin  certain medicines for  depression, anxiety, or psychotic disturbances like bupropion, citalopram, fluoxetine, fluphenazine, paroxetine, perphenazine, risperidone, sertraline, trifluoperazine  certain medicines for cholesterol like atorvastatin, lovastatin, simvastatin  certain medicines for stomach problems like octreotide, palonosetron, prochlorperazine  eplerenone  ergot alkaloids like dihydroergotamine, ergonovine, ergotamine, methylergonovine  metformin  nicardipine  other medicines that prolong the QT interval (cause an abnormal heart rhythm) like dofetilide, ziprasidone  sirolimus  tacrolimus This list may not describe all possible interactions. Give your health care provider a list of all the medicines, herbs, non-prescription drugs, or dietary supplements you use. Also tell them if you smoke, drink alcohol, or use illegal drugs. Some items may interact with your medicine. What should I watch for while using this medicine? Visit your doctor for regular check ups. Tell your doctor or healthcare professional if your symptoms do not start to get better or if they get worse. This medicine will not relieve an acute attack of angina or chest pain. This medicine can change your heart rhythm. Your health care provider may check your heart rhythm by ordering an electrocardiogram (ECG) while you are taking this medicine. You may get drowsy or dizzy. Do not drive, use machinery, or do anything that needs mental alertness until you know how this medicine affects you. Do not stand or sit up quickly, especially if you are an older patient. This reduces the risk of dizzy or fainting spells. Alcohol may interfere with the effect of this medicine. Avoid alcoholic drinks. If you are scheduled for any medical or  dental procedure, tell your healthcare provider that you are taking this medicine. This medicine can interact with other medicines used during surgery. What side effects may I notice from receiving this medicine? Side effects that you should report to your doctor or health care professional as soon as possible:  allergic reactions like skin rash, itching or hives, swelling of the face, lips, or tongue  breathing problems  changes in vision  fast, irregular or pounding heartbeat  feeling faint or lightheaded, falls  low or high blood pressure  numbness or tingling feelings  ringing in the ears  tremor or shakiness  slow heartbeat (fewer than 50 beats per minute)  swelling of the legs or feet Side effects that usually do not require medical attention (report to your doctor or health care professional if they continue or are bothersome):  constipation  drowsy  dry mouth  headache  nausea or vomiting  stomach upset This list may not describe all possible side effects. Call your doctor for medical advice about side effects. You may report side effects to FDA at 1-800-FDA-1088. Where should I keep my medicine? Keep out of the reach of children. Store at room temperature between 15 and 30 degrees C (59 and 86 degrees F). Throw away any unused medicine after the expiration date. NOTE: This sheet is a summary. It may not cover all possible information. If you have questions about this medicine, talk to your doctor, pharmacist, or health care provider.  2021 Elsevier/Gold Standard (2018-07-07 09:18:49)  Nitroglycerin sublingual tablets What is this medicine? NITROGLYCERIN (nye troe GLI ser in) is a type of vasodilator. It relaxes blood vessels, increasing the blood and oxygen supply to your heart. This medicine is used to relieve chest pain caused by angina. It is also used to prevent chest pain before activities like climbing stairs, going outdoors in cold weather, or sexual  activity. This medicine may be used for other purposes; ask your health care provider or pharmacist if you  have questions. COMMON BRAND NAME(S): Nitroquick, Nitrostat, Nitrotab What should I tell my health care provider before I take this medicine? They need to know if you have any of these conditions:  anemia  head injury, recent stroke, or bleeding in the brain  liver disease  previous heart attack  an unusual or allergic reaction to nitroglycerin, other medicines, foods, dyes, or preservatives  pregnant or trying to get pregnant  breast-feeding How should I use this medicine? Take this medicine by mouth as needed. Use at the first sign of an angina attack (chest pain or tightness). You can also take this medicine 5 to 10 minutes before an event likely to produce chest pain. Follow the directions exactly as written on the prescription label. Place one tablet under your tongue and let it dissolve. Do not swallow whole. Replace the dose if you accidentally swallow it. It will help if your mouth is not dry. Saliva around the tablet will help it to dissolve more quickly. Do not eat or drink, smoke or chew tobacco while a tablet is dissolving. Sit down when taking this medicine. In an angina attack, you should feel better within 5 minutes after your first dose. You can take a dose every 5 minutes up to a total of 3 doses. If you do not feel better or feel worse after 1 dose, call 9-1-1 at once. Do not take more than 3 doses in 15 minutes. Your health care provider might give you other directions. Follow those directions if he or she does. Do not take your medicine more often than directed. Talk to your health care provider about the use of this medicine in children. Special care may be needed. Overdosage: If you think you have taken too much of this medicine contact a poison control center or emergency room at once. NOTE: This medicine is only for you. Do not share this medicine with  others. What if I miss a dose? This does not apply. This medicine is only used as needed. What may interact with this medicine? Do not take this medicine with any of the following medications:  certain migraine medicines like ergotamine and dihydroergotamine (DHE)  medicines used to treat erectile dysfunction like sildenafil, tadalafil, and vardenafil  riociguat This medicine may also interact with the following medications:  alteplase  aspirin  heparin  medicines for high blood pressure  medicines for mental depression  other medicines used to treat angina  phenothiazines like chlorpromazine, mesoridazine, prochlorperazine, thioridazine This list may not describe all possible interactions. Give your health care provider a list of all the medicines, herbs, non-prescription drugs, or dietary supplements you use. Also tell them if you smoke, drink alcohol, or use illegal drugs. Some items may interact with your medicine. What should I watch for while using this medicine? Tell your doctor or health care professional if you feel your medicine is no longer working. Keep this medicine with you at all times. Sit or lie down when you take your medicine to prevent falling if you feel dizzy or faint after using it. Try to remain calm. This will help you to feel better faster. If you feel dizzy, take several deep breaths and lie down with your feet propped up, or bend forward with your head resting between your knees. You may get drowsy or dizzy. Do not drive, use machinery, or do anything that needs mental alertness until you know how this drug affects you. Do not stand or sit up quickly, especially if you are  an older patient. This reduces the risk of dizzy or fainting spells. Alcohol can make you more drowsy and dizzy. Avoid alcoholic drinks. Do not treat yourself for coughs, colds, or pain while you are taking this medicine without asking your doctor or health care professional for advice.  Some ingredients may increase your blood pressure. What side effects may I notice from receiving this medicine? Side effects that you should report to your doctor or health care professional as soon as possible:  allergic reactions (skin rash, itching or hives; swelling of the face, lips, or tongue)  low blood pressure (dizziness; feeling faint or lightheaded, falls; unusually weak or tired)  low red blood cell counts (trouble breathing; feeling faint; lightheaded, falls; unusually weak or tired) Side effects that usually do not require medical attention (report to your doctor or health care professional if they continue or are bothersome):  facial flushing (redness)  headache  nausea, vomiting This list may not describe all possible side effects. Call your doctor for medical advice about side effects. You may report side effects to FDA at 1-800-FDA-1088. Where should I keep my medicine? Keep out of the reach of children. Store at room temperature between 20 and 25 degrees C (68 and 77 degrees F). Store in Retail buyer. Protect from light and moisture. Keep tightly closed. Throw away any unused medicine after the expiration date. NOTE: This sheet is a summary. It may not cover all possible information. If you have questions about this medicine, talk to your doctor, pharmacist, or health care provider.  2021 Elsevier/Gold Standard (2018-04-15 16:46:32)

## 2020-12-18 NOTE — Progress Notes (Signed)
Cardiology Office Note:    Date:  12/18/2020   ID:  Kyle [name], DOB 1961/04/13, MRN [id number]  PCP:  [name], MD  Cardiologist:  Garwin Brothers, MD   Referring MD: [name], MD    ASSESSMENT:    1. Coronary artery disease of native artery of native heart with stable angina pectoris (HCC)   2. Essential (primary) hypertension   3. Mixed hyperlipidemia   4. Type 2 diabetes mellitus without complication, with long-term current use of insulin (HCC)   5. Morbid obesity (HCC)    PLAN:    In order of problems listed above:  1. Coronary artery disease and angina pectoris: Patient has significant coronary calcifications on the CT scan done in the past.  Secondary prevention stressed with the patient.  Importance of compliance with diet medication stressed and he vocalized understanding.  The issue about his engine and management is largely connected to his diabetes mellitus and significant renal insufficiency.  I discussed invasive and noninvasive evaluation and he prefers the latter especially in view of renal insufficiency and radioactive contrast usage.  For this reason we will set him up for a Lexiscan sestamibi.  Sublingual nitroglycerin prescription was sent, its protocol and 911 protocol explained and the patient vocalized understanding questions were answered to the patient's satisfaction.  He knows to go to the nearest emergency room for any concerning symptoms. 2. Essential hypertension: Blood pressure stable and diet was emphasized. 3. Cardiac murmur: Echocardiogram will be done to assess murmur heard on auscultation. 4. Mixed dyslipidemia diabetes mellitus and obesity: Lifestyle modification was urged and weight reduction was stressed.  Risks of obesity explained to him at length and he promises to do better. 5. Renal insufficiency: As mentioned above this has a significant improvement in some evaluation and treatment. 6. Stable angina pectoris: With his recent I  have prescribed him Ranexa 500 mg twice daily.Patient will be seen in follow-up appointment in 4 months or earlier if the patient has any concerns    Medication Adjustments/Labs and Tests Ordered: Current medicines are reviewed at length with the patient today.  Concerns regarding medicines are outlined above.  No orders of the defined types were placed in this encounter.  No orders of the defined types were placed in this encounter.    History of Present Illness:    Kyle [name] is a 60 y.o. male who is being seen today for the evaluation of chest pain at the request of [name], MD.  Patient is a pleasant 60 year old male.  He has past medical history of essential hypertension, dyslipidemia, diabetes mellitus, renal insufficiency and morbid obesity.  He leads a sedentary lifestyle.  He tells me that he walks the dog on a regular basis.  Sometimes he has chest pain going to the neck into the arms.  Nitroglycerin has helped his pain.  No orthopnea or PND.  At the time of my evaluation, the patient is alert awake oriented and in no distress.  Past Medical History:  Diagnosis Date  . Anemia of unknown etiology   . Anxiety disorder   . Arthritis   . ATHEROSLERO NATV ART EXTREM W/INTERMIT CLAUDICAT 01/26/2009   Qualifier: Diagnosis of  By: Madelin Rear    . Benign prostatic hyperplasia with urinary frequency   . CHEST PAIN UNSPECIFIED 01/26/2009   Qualifier: Diagnosis of  By: Madelin Rear    . Class 2 severe obesity due to excess calories with serious comorbidity and body  mass index (BMI) of 38.0 to 38.9 in adult [facility])   . CLAUDICATION 01/26/2009   Qualifier: Diagnosis of  By: Madelin Rear    . Coronary artery disease    minimal CAD '12; NL stress test 06/10/14 ([facility])  . Coronary artery disease of native artery of native heart with stable angina pectoris (HCC)   . Degeneration of intervertebral disc of lumbosacral region 08/23/2015  . Degenerative disc disease, lumbar    . Degenerative lumbar disc 11/01/2020   Formatting of this note might be different from the original. Was recommended back surgery. Holding out.  . Dehydration 01/26/2009   Qualifier: Diagnosis of  By: Madelin Rear    . Depression   . Diabetes (HCC) 11/01/2013  . Diabetes mellitus without complication (HCC)   . DYSPNEA 01/26/2009   Qualifier: Diagnosis of  By: Madelin Rear    . Dysthymic disorder 01/26/2009   Qualifier: Diagnosis of  By: Madelin Rear    . Elevated alkaline phosphatase level 11/27/2020  . Essential (primary) hypertension 08/23/2015  . Family history of adverse reaction to anesthesia    "it made my son sick"  . Generalized abdominal pain 08/23/2015  . GERD (gastroesophageal reflux disease)   . History of arthroplasty of right knee   . Hyperlipemia   . Iron deficiency anemia due to chronic blood loss 11/07/2020  . Metatarsal deformity 11/01/2013  . Mild intermittent asthma without complication   . Mixed hyperlipidemia   . Moderate episode of recurrent major depressive disorder (HCC)   . Morbid obesity (HCC) 08/23/2015  . Myocardial infarction (HCC)    2012; hospitalized [facility] for chest pain syndrome 02/2011 and LHC showed trivial CAD  . Pain in lower limb 12/07/2013  . Plantar fasciitis of left foot 11/01/2013  . Pneumonia 08/2016   surgery had to be rescheduled due to pneumonia  . Primary localized osteoarthritis of left knee 10/14/2016  . Primary osteoarthritis involving multiple joints 11/01/2020   Formatting of this note might be different from the original. both knees replaced.  . Primary osteoarthritis of left knee 09/06/2016  . Primary osteoarthritis of right knee 09/30/2015  . Psoriasis   . Sepsis, unspecified organism (HCC) 10/20/2015  . Type 2 diabetes mellitus without complication, with long-term current use of insulin (HCC) 11/01/2013  . Type 2 diabetes mellitus without complications (HCC) 08/23/2015  . Uncomplicated opioid dependence (HCC)   . Vitamin B12  deficiency 11/02/2020    Past Surgical History:  Procedure Laterality Date  . CARDIAC CATHETERIZATION     03/28/11 LHC: NL LM, LAD; 10% mCX, mRCA. EF 65%. (Dr. Dot Been, [facility])  . CHOLECYSTECTOMY    . CORONARY ANGIOPLASTY     2012 [facility]   . ESOPHAGEAL DILATION     SEVERAL TIMES  . Heel Spur Resection with Fasiotomy Left 11/25/2013   @ [facility]  . KNEE ARTHROSCOPY Bilateral   . SHOULDER ARTHROSCOPY WITH OPEN ROTATOR CUFF REPAIR Right   . TOTAL KNEE ARTHROPLASTY Right 10/04/2015   Procedure: RIGHT TOTAL KNEE ARTHROPLASTY;  Surgeon: Gean Birchwood, MD;  Location: [facility] OR;  Service: Orthopedics;  Laterality: Right;  . TOTAL KNEE ARTHROPLASTY Left 10/14/2016  . TOTAL KNEE ARTHROPLASTY Left 10/14/2016   Procedure: LEFT TOTAL KNEE ARTHROPLASTY;  Surgeon: Gean Birchwood, MD;  Location: [facility] OR;  Service: Orthopedics;  Laterality: Left;  . TUMOR REMOVAL     FATTY TUMOR  RT SHOULDER    Current Medications: Current Meds  Medication Sig  . albuterol (VENTOLIN  HFA) 108 (90 Base) MCG/ACT inhaler Inhale 2 puffs into the lungs every 4 (four) hours.  Marland Kitchen amLODipine (NORVASC) 5 MG tablet Take 5 mg by mouth daily.  Marland Kitchen aspirin 325 MG tablet Take 325 mg by mouth daily.  . Buprenorphine HCl-Naloxone HCl 8-2 MG FILM Place 2 Film under the tongue daily.  Marland Kitchen buPROPion (WELLBUTRIN) 100 MG tablet Take 1 tablet by mouth daily.  . busPIRone (BUSPAR) 10 MG tablet Take 10 mg by mouth 2 (two) times daily.  . fenofibrate (TRICOR) 48 MG tablet Take 48 mg by mouth daily.  Marland Kitchen gabapentin (NEURONTIN) 300 MG capsule Take 600 mg by mouth 3 (three) times daily.  Marland Kitchen glipiZIDE (GLUCOTROL) 10 MG tablet Take 10 mg by mouth 2 (two) times daily.  . halobetasol (ULTRAVATE) 0.05 % ointment Apply 1 application topically 2 (two) times daily.  . insulin aspart (NOVOLOG) 100 UNIT/ML FlexPen Inject 15 Units into the skin 3 (three) times daily with meals.  . Insulin Glargine (BASAGLAR KWIKPEN) 100 UNIT/ML Inject 40 Units into the skin at bedtime.  Marland Kitchen  lisinopril (PRINIVIL,ZESTRIL) 10 MG tablet Take 10 mg by mouth daily.  . metFORMIN (GLUCOPHAGE) 500 MG tablet Take 1,000 mg by mouth 2 (two) times daily.   . metoprolol succinate (TOPROL-XL) 25 MG 24 hr tablet Take 25 mg by mouth daily.  . nitroGLYCERIN (NITROSTAT) 0.4 MG SL tablet Place 0.4 mg under the tongue every 5 (five) minutes as needed for chest pain.  Marland Kitchen omeprazole (PRILOSEC) 40 MG capsule Take 40 mg by mouth daily.  Marland Kitchen PARoxetine (PAXIL) 40 MG tablet Take 40 mg by mouth daily.  . rosuvastatin (CRESTOR) 20 MG tablet Take 20 mg by mouth at bedtime.   . tamsulosin (FLOMAX) 0.4 MG CAPS capsule Take 1 capsule by mouth daily.  Marland Kitchen triamcinolone cream (KENALOG) 0.1 % Apply 1 application topically 2 (two) times daily as needed for rash.     Allergies:   Cymbalta [duloxetine hcl] and Penicillins   Social History   Socioeconomic History  . Marital status: Married    Spouse name: Not on file  . Number of children: Not on file  . Years of education: Not on file  . Highest education level: Not on file  Occupational History  . Not on file  Tobacco Use  . Smoking status: Never Smoker  . Smokeless tobacco: Never Used  Substance and Sexual Activity  . Alcohol use: No  . Drug use: No  . Sexual activity: Not on file  Other Topics Concern  . Not on file  Social History Narrative  . Not on file   Social Determinants of Health   Financial Resource Strain: Not on file  Food Insecurity: Not on file  Transportation Needs: Not on file  Physical Activity: Not on file  Stress: Not on file  Social Connections: Not on file     Family History: The patient's family history includes Cancer in his mother; Diabetes in his mother; Heart failure in his brother and father; Hyperlipidemia in his father; Hypertension in his father.  ROS:   Please see the history of present illness.    All other systems reviewed and are negative.  EKGs/Labs/Other Studies Reviewed:    The following studies were  reviewed today: EKG reveals sinus rhythm and nonspecific ST-T changes.   Recent Labs: No results found for requested labs within last 8760 hours.  Recent Lipid Panel No results found for: CHOL, TRIG, HDL, CHOLHDL, VLDL, LDLCALC, LDLDIRECT  Physical Exam:  VS:  BP 118/68   Pulse 71   Ht 5\' 8"  (1.727 m)   Wt 237 lb 3.2 oz (107.6 kg)   SpO2 93%   BMI 36.07 kg/m     Wt Readings from Last 3 Encounters:  12/18/20 237 lb 3.2 oz (107.6 kg)  10/14/16 249 lb 9.6 oz (113.2 kg)  10/04/16 249 lb 9.6 oz (113.2 kg)     GEN: Patient is in no acute distress HEENT: Normal NECK: No JVD; No carotid bruits LYMPHATICS: No lymphadenopathy CARDIAC: S1 S2 regular, 2/6 systolic murmur at the apex. RESPIRATORY:  Clear to auscultation without rales, wheezing or rhonchi  ABDOMEN: Soft, non-tender, non-distended MUSCULOSKELETAL:  No edema; No deformity  SKIN: Warm and dry NEUROLOGIC:  Alert and oriented x 3 PSYCHIATRIC:  Normal affect    Signed, Garwin Brothers, MD  12/18/2020 4:26 PM    [facility]

## 2020-12-20 ENCOUNTER — Telehealth: Payer: Self-pay | Admitting: *Deleted

## 2020-12-20 NOTE — Telephone Encounter (Signed)
Left message on voicemail per DPR in reference to upcoming appointment scheduled on 12/27/20 at 0745 with detailed instructions given per Myocardial Perfusion Study Information Sheet for the test. LM to arrive 15 minutes early, and that it is imperative to arrive on time for appointment to keep from having the test rescheduled. If you need to cancel or reschedule your appointment, please call the office within 24 hours of your appointment. Failure to do so may result in a cancellation of your appointment, and a $50 no show fee. Phone number given for call back for any questions. No mychart. Kyle [name], [name]

## 2020-12-27 ENCOUNTER — Ambulatory Visit (INDEPENDENT_AMBULATORY_CARE_PROVIDER_SITE_OTHER): Payer: BC Managed Care – PPO

## 2020-12-27 ENCOUNTER — Other Ambulatory Visit: Payer: Self-pay

## 2020-12-27 VITALS — Ht 68.0 in | Wt 237.0 lb

## 2020-12-27 DIAGNOSIS — I10 Essential (primary) hypertension: Secondary | ICD-10-CM

## 2020-12-27 DIAGNOSIS — E119 Type 2 diabetes mellitus without complications: Secondary | ICD-10-CM

## 2020-12-27 DIAGNOSIS — I2584 Coronary atherosclerosis due to calcified coronary lesion: Secondary | ICD-10-CM | POA: Diagnosis not present

## 2020-12-27 DIAGNOSIS — R110 Nausea: Secondary | ICD-10-CM

## 2020-12-27 DIAGNOSIS — I25118 Atherosclerotic heart disease of native coronary artery with other forms of angina pectoris: Secondary | ICD-10-CM

## 2020-12-27 DIAGNOSIS — E782 Mixed hyperlipidemia: Secondary | ICD-10-CM | POA: Diagnosis not present

## 2020-12-27 DIAGNOSIS — Z794 Long term (current) use of insulin: Secondary | ICD-10-CM

## 2020-12-27 DIAGNOSIS — I219 Acute myocardial infarction, unspecified: Secondary | ICD-10-CM

## 2020-12-27 MED ORDER — TECHNETIUM TC 99M TETROFOSMIN IV KIT: 32.8000 | PACK | Freq: Once | INTRAVENOUS | Status: DC | PRN

## 2020-12-27 MED ORDER — TECHNETIUM TC 99M TETROFOSMIN IV KIT
10.2000 | PACK | Freq: Once | INTRAVENOUS | Status: AC | PRN
  Administered 2020-12-27: 10.2 via INTRAVENOUS

## 2020-12-27 MED ORDER — AMINOPHYLLINE 25 MG/ML IV SOLN
150.0000 mg | Freq: Once | INTRAVENOUS | Status: AC
  Administered 2020-12-27: 150 mg via INTRAVENOUS

## 2020-12-27 MED ORDER — REGADENOSON 0.4 MG/5ML IV SOLN
0.4000 mg | Freq: Once | INTRAVENOUS | Status: AC
  Administered 2020-12-27: 0.4 mg via INTRAVENOUS

## 2020-12-27 MED ORDER — TECHNETIUM TC 99M TETROFOSMIN IV KIT
32.4000 | PACK | Freq: Once | INTRAVENOUS | Status: AC | PRN
  Administered 2020-12-27: 32.4 via INTRAVENOUS

## 2020-12-28 LAB — MYOCARDIAL PERFUSION IMAGING
LV dias vol: 133 mL (ref 62–150)
LV sys vol: 62 mL
Peak HR: 110 {beats}/min
Rest HR: 67 {beats}/min
SDS: 0
SRS: 1
SSS: 1
TID: 1.15

## 2020-12-31 DIAGNOSIS — N453 Epididymo-orchitis: Secondary | ICD-10-CM | POA: Insufficient documentation

## 2020-12-31 HISTORY — DX: Epididymo-orchitis: N45.3

## 2021-01-03 ENCOUNTER — Telehealth: Payer: Self-pay | Admitting: Cardiology

## 2021-01-03 NOTE — Telephone Encounter (Signed)
Results reviewed with pt as per Dr. Revankar's note.  Pt verbalized understanding and had no additional questions. Routed to PCP.  

## 2021-01-03 NOTE — Telephone Encounter (Signed)
PT is returning a call 

## 2021-01-10 ENCOUNTER — Other Ambulatory Visit: Payer: Medicare Other

## 2021-01-18 ENCOUNTER — Ambulatory Visit: Payer: Medicare Other | Admitting: Cardiology

## 2021-01-19 ENCOUNTER — Ambulatory Visit: Payer: Medicare Other | Admitting: Cardiology

## 2021-01-31 ENCOUNTER — Other Ambulatory Visit: Payer: BC Managed Care – PPO

## 2021-01-31 DIAGNOSIS — M5440 Lumbago with sciatica, unspecified side: Secondary | ICD-10-CM

## 2021-01-31 HISTORY — DX: Lumbago with sciatica, unspecified side: M54.40

## 2021-02-06 ENCOUNTER — Other Ambulatory Visit: Payer: BC Managed Care – PPO

## 2021-02-16 ENCOUNTER — Other Ambulatory Visit: Payer: Self-pay

## 2021-02-19 ENCOUNTER — Encounter: Payer: Self-pay | Admitting: Cardiology

## 2021-02-19 ENCOUNTER — Ambulatory Visit: Payer: Medicare Other | Admitting: Cardiology

## 2021-02-23 ENCOUNTER — Other Ambulatory Visit: Payer: Self-pay

## 2021-02-23 ENCOUNTER — Ambulatory Visit (INDEPENDENT_AMBULATORY_CARE_PROVIDER_SITE_OTHER): Payer: BC Managed Care – PPO

## 2021-02-23 ENCOUNTER — Telehealth: Payer: Self-pay | Admitting: Cardiology

## 2021-02-23 DIAGNOSIS — I10 Essential (primary) hypertension: Secondary | ICD-10-CM

## 2021-02-23 DIAGNOSIS — I25118 Atherosclerotic heart disease of native coronary artery with other forms of angina pectoris: Secondary | ICD-10-CM

## 2021-02-23 DIAGNOSIS — Z794 Long term (current) use of insulin: Secondary | ICD-10-CM

## 2021-02-23 DIAGNOSIS — E782 Mixed hyperlipidemia: Secondary | ICD-10-CM | POA: Diagnosis not present

## 2021-02-23 DIAGNOSIS — E119 Type 2 diabetes mellitus without complications: Secondary | ICD-10-CM

## 2021-02-23 MED ORDER — NITROGLYCERIN 0.4 MG SL SUBL: 0.4000 mg | SUBLINGUAL_TABLET | SUBLINGUAL | 9 refills | Status: DC | PRN

## 2021-02-23 NOTE — Progress Notes (Signed)
Complete echocardiogram performed.  Jimmy Masayo Fera RDCS, RVT  

## 2021-02-23 NOTE — Telephone Encounter (Signed)
Refill of Nitroglycerin sent to [facility].

## 2021-02-23 NOTE — Telephone Encounter (Signed)
Patient needs Nitro refilled

## 2021-02-25 LAB — ECHOCARDIOGRAM COMPLETE
Area-P 1/2: 3.97 cm2
S' Lateral: 3.2 cm

## 2021-05-30 DIAGNOSIS — Z91199 Patient's noncompliance with other medical treatment and regimen due to unspecified reason: Secondary | ICD-10-CM | POA: Insufficient documentation

## 2021-05-30 HISTORY — DX: Patient's noncompliance with other medical treatment and regimen due to unspecified reason: Z91.199

## 2021-12-19 DIAGNOSIS — F5101 Primary insomnia: Secondary | ICD-10-CM

## 2021-12-19 HISTORY — DX: Primary insomnia: F51.01

## 2022-01-07 DIAGNOSIS — L97519 Non-pressure chronic ulcer of other part of right foot with unspecified severity: Secondary | ICD-10-CM

## 2022-01-07 HISTORY — DX: Non-pressure chronic ulcer of other part of right foot with unspecified severity: L97.519

## 2022-02-28 ENCOUNTER — Telehealth: Payer: Self-pay

## 2022-02-28 NOTE — Telephone Encounter (Signed)
   Pre-operative Risk Assessment    Patient Name: Kyle [name]  DOB: Jun 02, 1961 MRN: [id number]      Request for Surgical Clearance    Procedure:   laparoscopic hand assist rt colectomy  Date of Surgery:  Clearance 04/10/22                                 Surgeon:   Surgeon's Group or Practice Name:  [facility] Phone number:  [phone] Fax number:  [phone]   Type of Clearance Requested:   - Medical    Type of Anesthesia:  Not Indicated   Additional requests/questions:    Merlene Laughter   02/28/2022, 4:38 PM

## 2022-03-01 NOTE — Telephone Encounter (Signed)
Left message to call back to schedule an appt in office with Dr. Tomie China for pre op clearance.

## 2022-03-01 NOTE — Telephone Encounter (Signed)
   Name: Kyle [name]  DOB: 1960/11/04  MRN: [id number]  Primary Cardiologist: None  Chart reviewed as part of pre-operative protocol coverage. Because of Ihor Mcenery's past medical history and time since last visit, he will require a follow-up in-office visit in order to better assess preoperative cardiovascular risk.  Pre-op covering staff: - Please schedule appointment and call patient to inform them. If patient already had an upcoming appointment within acceptable timeframe, please add "pre-op clearance" to the appointment notes so provider is aware. - Please contact requesting surgeon's office via preferred method (i.e, phone, fax) to inform them of need for appointment prior to surgery.    Napoleon Form, Leodis Rains, NP  03/01/2022, 8:07 AM

## 2022-03-04 DIAGNOSIS — K6389 Other specified diseases of intestine: Secondary | ICD-10-CM

## 2022-03-04 HISTORY — DX: Other specified diseases of intestine: K63.89

## 2022-03-05 ENCOUNTER — Ambulatory Visit: Payer: Medicare Other | Admitting: Cardiology

## 2022-03-05 ENCOUNTER — Encounter: Payer: Self-pay | Admitting: Cardiology

## 2022-03-05 VITALS — BP 102/66 | HR 70 | Ht 68.0 in | Wt 238.2 lb

## 2022-03-05 DIAGNOSIS — I2510 Atherosclerotic heart disease of native coronary artery without angina pectoris: Secondary | ICD-10-CM

## 2022-03-05 DIAGNOSIS — Z794 Long term (current) use of insulin: Secondary | ICD-10-CM | POA: Diagnosis not present

## 2022-03-05 DIAGNOSIS — E119 Type 2 diabetes mellitus without complications: Secondary | ICD-10-CM | POA: Diagnosis not present

## 2022-03-05 DIAGNOSIS — E782 Mixed hyperlipidemia: Secondary | ICD-10-CM | POA: Diagnosis not present

## 2022-03-05 NOTE — Progress Notes (Signed)
Cardiology Office Note:    Date:  03/05/2022   ID:  Kyle [name], DOB [birth year], MRN [id number]  PCP:  [name], MD  Cardiologist:  Garwin Brothers, MD   Referring MD: [name], MD    ASSESSMENT:    1. Coronary artery disease involving native coronary artery of native heart without angina pectoris   2. Mixed hyperlipidemia   3. Type 2 diabetes mellitus without complication, with long-term current use of insulin (HCC)    PLAN:    In order of problems listed above:  Coronary artery disease: Secondary prevention stressed with the patient.  Importance of compliance with diet and medication stressed he vocalized understanding. Essential hypertension: Blood pressure stable and diet was emphasized. Preoperative risk stratification from cardiovascular standpoint: Patient has multiple risk factors for coronary artery disease and leads a sedentary lifestyle.  To assess him for this elective surgery I suggested Lexiscan sestamibi and he is agreeable.  We will get this scheduled in the next few days.  If this is negative then he is not at high risk for coronary events during the aforementioned surgery.  Meticulous hemodynamic monitoring will further reduce the risk of coronary events. Diabetes mellitus and obesity: Managed by primary care.  Weight reduction stressed.  These issues and optimization of diabetes will be followed by primary care. Patient will be seen in follow-up appointment in 6 months or earlier if the patient has any concerns    Medication Adjustments/Labs and Tests Ordered: Current medicines are reviewed at length with the patient today.  Concerns regarding medicines are outlined above.  No orders of the defined types were placed in this encounter.  No orders of the defined types were placed in this encounter.    Chief Complaint  Patient presents with   CLearnace 04/17/2022    Dr. Suezanne Cheshire, Laparoscopic hand assist right colectomy     History of  Present Illness:    Kyle [name] is a 60 y.o. male.  Patient has past medical history of coronary artery disease, essential hypertension, dyslipidemia and diabetes mellitus.  He is obese and leads a sedentary lifestyle.  He has been diagnosed with colon cancer and planning to undergo surgery.  He is here for preop assessment.  He leads a sedentary lifestyle.  He denies any chest pain orthopnea or PND.  At the time of my evaluation, the patient is alert awake oriented and in no distress.  Past Medical History:  Diagnosis Date   Anemia of unknown etiology    Anxiety disorder    Arthritis    ATHEROSLERO NATV ART EXTREM W/INTERMIT CLAUDICAT 01/26/2009   Qualifier: Diagnosis of  By: Valinda Party RN, Nancy     Back pain of lumbar region with sciatica 01/31/2021   Benign prostatic hyperplasia with urinary frequency    CHEST PAIN UNSPECIFIED 01/26/2009   Qualifier: Diagnosis of  By: Valinda Party RN, Nancy     Class 2 severe obesity due to excess calories with serious comorbidity and body mass index (BMI) of 38.0 to 38.9 in adult [facility])    CLAUDICATION 01/26/2009   Qualifier: Diagnosis of  By: Valinda Party RN, Harriett Sine     Coronary artery disease    minimal CAD '12; NL stress test 06/10/14 ([facility])   Coronary artery disease of native artery of native heart with stable angina pectoris (HCC)    Degeneration of intervertebral disc of lumbosacral region 08/23/2015   Degenerative disc disease, lumbar    Degenerative lumbar disc 11/01/2020   Formatting  of this note might be different from the original. Was recommended back surgery. Holding out.   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   Qualifier: Diagnosis of  By: Valinda Party RN, Nancy     Elevated alkaline phosphatase level 11/27/2020   Epididymoorchitis  12/31/2020   Essential (primary) hypertension 08/23/2015   Family history of adverse reaction to anesthesia    "it made my son sick"   Generalized abdominal pain 08/23/2015   GERD (gastroesophageal reflux disease)    History of arthroplasty of right knee    Hyperlipemia    Iron deficiency anemia due to chronic blood loss 11/07/2020   Metatarsal deformity 11/01/2013   Mild intermittent asthma without complication    Mixed hyperlipidemia    Moderate episode of recurrent major depressive disorder (HCC)    Morbid obesity (HCC) 08/23/2015   Myocardial infarction (HCC)    2012; hospitalized [facility] for chest pain syndrome 02/2011 and LHC showed trivial CAD   Pain in lower limb 12/07/2013   Plantar fasciitis of left foot 11/01/2013   Pneumonia 08/2016   surgery had to be rescheduled due to pneumonia   Primary localized osteoarthritis of left knee 10/14/2016   Primary osteoarthritis involving multiple joints 11/01/2020   Formatting of this note might be different from the original. both knees replaced.   Primary osteoarthritis of left knee 09/06/2016   Primary osteoarthritis of right knee 09/30/2015   Psoriasis    Sepsis, unspecified organism (HCC) 10/20/2015   Type 2 diabetes mellitus without complication, with long-term current use of insulin (HCC) 11/01/2013   Type 2 diabetes mellitus without complications (HCC) 08/23/2015   Uncomplicated opioid dependence (HCC)    Vitamin B12 deficiency 11/02/2020    Past Surgical History:  Procedure Laterality Date   CARDIAC CATHETERIZATION     03/28/11 LHC: NL LM, LAD; 10% mCX, mRCA. EF 65%. (Dr. Dot Been, [facility])   CHOLECYSTECTOMY     CORONARY ANGIOPLASTY     2012 [facility]    ESOPHAGEAL DILATION     SEVERAL TIMES   Heel Spur Resection with Fasiotomy Left 11/25/2013   @ [facility]   KNEE ARTHROSCOPY Bilateral    SHOULDER ARTHROSCOPY WITH OPEN ROTATOR CUFF REPAIR Right    TOTAL KNEE ARTHROPLASTY Right 10/04/2015   Procedure: RIGHT TOTAL KNEE ARTHROPLASTY;  Surgeon: Gean Birchwood, MD;  Location: [facility] OR;  Service: Orthopedics;  Laterality: Right;   TOTAL KNEE ARTHROPLASTY Left 10/14/2016   TOTAL KNEE ARTHROPLASTY Left 10/14/2016   Procedure: LEFT TOTAL KNEE ARTHROPLASTY;  Surgeon: Gean Birchwood, MD;  Location: [facility] OR;  Service: Orthopedics;  Laterality: Left;   TUMOR REMOVAL     FATTY TUMOR  RT SHOULDER    Current Medications: Current Meds  Medication Sig   albuterol (VENTOLIN HFA) 108 (90 Base) MCG/ACT inhaler Inhale 2 puffs into the lungs every 4 (four) hours.   aspirin 325 MG tablet Take 325 mg by mouth daily.   Buprenorphine HCl-Naloxone HCl 8-2 MG FILM Place 2 Film under the tongue daily.   buPROPion (WELLBUTRIN) 100 MG tablet Take 1 tablet by mouth daily.   busPIRone (BUSPAR) 10 MG tablet Take 10 mg by mouth 2 (two) times daily.   fenofibrate (TRICOR) 48 MG tablet Take 48 mg  by mouth daily.   gabapentin (NEURONTIN) 300 MG capsule Take 600 mg by mouth 3 (three) times daily.   glipiZIDE (GLUCOTROL) 10 MG tablet Take 10 mg by mouth 2 (two) times daily.   halobetasol (ULTRAVATE) 0.05 % ointment Apply 1 application topically 2 (two) times daily.   insulin aspart (NOVOLOG) 100 UNIT/ML FlexPen Inject 15 Units into the skin 3 (three) times daily with meals.   Insulin Glargine (BASAGLAR KWIKPEN) 100 UNIT/ML Inject 40 Units into the skin at bedtime.   metFORMIN (GLUCOPHAGE) 500 MG tablet Take 1,000 mg by mouth 2 (two) times daily.    metoprolol succinate (TOPROL-XL) 25 MG 24 hr tablet Take 25 mg by mouth daily.   nitroGLYCERIN (NITROSTAT) 0.4 MG SL tablet Place 1 tablet (0.4 mg total) under the tongue every 5 (five) minutes as needed for chest pain.   omeprazole (PRILOSEC) 40 MG capsule Take 40 mg by mouth daily.   PARoxetine (PAXIL) 40 MG tablet Take 40 mg by mouth daily.   ranolazine (RANEXA) 500 MG 12 hr tablet Take 1 tablet (500 mg total) by mouth 2 (two) times daily.   rosuvastatin (CRESTOR) 20 MG tablet Take 20 mg by mouth at bedtime.    tamsulosin (FLOMAX) 0.4  MG CAPS capsule Take 0.8 mg by mouth daily.   triamcinolone cream (KENALOG) 0.1 % Apply 1 application topically 2 (two) times daily as needed for rash.   [DISCONTINUED] amLODipine (NORVASC) 5 MG tablet Take 5 mg by mouth daily.   [DISCONTINUED] lisinopril (PRINIVIL,ZESTRIL) 10 MG tablet Take 10 mg by mouth daily.     Allergies:   Duloxetine, Penicillins, and Cymbalta [duloxetine hcl]   Social History   Socioeconomic History   Marital status: Married    Spouse name: Not on file   Number of children: Not on file   Years of education: Not on file   Highest education level: Not on file  Occupational History   Not on file  Tobacco Use   Smoking status: Never   Smokeless tobacco: Never  Substance and Sexual Activity   Alcohol use: No   Drug use: No   Sexual activity: Not on file  Other Topics Concern   Not on file  Social History Narrative   Not on file   Social Determinants of Health   Financial Resource Strain: Not on file  Food Insecurity: Not on file  Transportation Needs: Not on file  Physical Activity: Not on file  Stress: Not on file  Social Connections: Not on file     Family History: The patient's family history includes Cancer in his mother; Diabetes in his mother; Heart failure in his brother and father; Hyperlipidemia in his father; Hypertension in his father.  ROS:   Please see the history of present illness.    All other systems reviewed and are negative.  EKGs/Labs/Other Studies Reviewed:    The following studies were reviewed today: I discussed my findings with the patient at length   Recent Labs: No results found for requested labs within last 365 days.  Recent Lipid Panel No results found for: "CHOL", "TRIG", "HDL", "CHOLHDL", "VLDL", "LDLCALC", "LDLDIRECT"  Physical Exam:    VS:  BP 102/66 (BP Location: Left Arm, Patient Position: Sitting)   Pulse 70   Ht 5\' 8"  (1.727 m)   Wt 238 lb 3.2 oz (108 kg)   SpO2 90%   BMI 36.22 kg/m     Wt  Readings from Last 3 Encounters:  03/05/22 238 lb 3.2 oz (  108 kg)  12/27/20 237 lb (107.5 kg)  12/18/20 237 lb 3.2 oz (107.6 kg)     GEN: Patient is in no acute distress HEENT: Normal NECK: No JVD; No carotid bruits LYMPHATICS: No lymphadenopathy CARDIAC: Hear sounds regular, 2/6 systolic murmur at the apex. RESPIRATORY:  Clear to auscultation without rales, wheezing or rhonchi  ABDOMEN: Soft, non-tender, non-distended MUSCULOSKELETAL:  No edema; No deformity  SKIN: Warm and dry NEUROLOGIC:  Alert and oriented x 3 PSYCHIATRIC:  Normal affect   Signed, Garwin Brothers, MD  03/05/2022 2:25 PM    [facility]

## 2022-03-05 NOTE — Patient Instructions (Signed)
Medication Instructions:  Your physician recommends that you continue on your current medications as directed. Please refer to the Current Medication list given to you today.  *If you need a refill on your cardiac medications before your next appointment, please call your pharmacy*   Lab Work:NONE . If you have labs (blood work) drawn today and your tests are completely normal, you will receive your results only by: MyChart Message (if you have MyChart) OR A paper copy in the mail If you have any lab test that is abnormal or we need to change your treatment, we will call you to review the results.   Testing/Procedures: Your physician has requested that you have a lexiscan myoview. For further information please visit [email or web address] Please follow instruction sheet, as given.   The test will take approximately 3 to 4 hours to complete; you may bring reading material.  If someone comes with you to your appointment, they will need to remain in the main lobby due to limited space in the testing area.   How to prepare for your Myocardial Perfusion Test:             Hold all erectile dysfunction medication for 72 hours prior to test Do not eat or drink 3 hours prior to your test, except you may have water. Do not consume products containing caffeine (regular or decaffeinated) 12 hours prior to your test. (ex: coffee, chocolate, sodas, tea). Do bring a list of your current medications with you.  If not listed below, you may take your medications as normal. Do wear comfortable clothes (no dresses or overalls) and walking shoes, tennis shoes preferred (No heels or open toe shoes are allowed). Do NOT wear cologne, perfume, aftershave, or lotions (deodorant is allowed). If these instructions are not followed, your test will have to be rescheduled.    Follow-Up: At [facility], you and your health needs are our priority.  As part of our continuing mission to provide you with exceptional  heart care, we have created designated Provider Care Teams.  These Care Teams include your primary Cardiologist (physician) and Advanced Practice Providers (APPs -  Physician Assistants and Nurse Practitioners) who all work together to provide you with the care you need, when you need it.  We recommend signing up for the patient portal called "MyChart".  Sign up information is provided on this After Visit Summary.  MyChart is used to connect with patients for Virtual Visits (Telemedicine).  Patients are able to view lab/test results, encounter notes, upcoming appointments, etc.  Non-urgent messages can be sent to your provider as well.   To learn more about what you can do with MyChart, go to [email or web address].    Your next appointment:   6 month(s)  The format for your next appointment:   In Person  Provider:   Belva Crome, MD    Other Instructions   Important Information About Sugar

## 2022-03-05 NOTE — Telephone Encounter (Signed)
Pt is scheduled to see Dr. Josiah Lobo, today, for surgical clearance.  Will route to the requesting surgeon's office to make them aware.

## 2022-03-06 ENCOUNTER — Telehealth (HOSPITAL_COMMUNITY): Payer: Self-pay | Admitting: *Deleted

## 2022-03-06 NOTE — Telephone Encounter (Signed)
Patient given detailed instructions per Myocardial Perfusion Study Information Sheet for the test on 03/13/2022 at 11:00. Patient notified to arrive 15 minutes early and that it is imperative to arrive on time for appointment to keep from having the test rescheduled.  If you need to cancel or reschedule your appointment, please call the office within 24 hours of your appointment. . Patient verbalized understanding.Daneil Dolin

## 2022-03-13 ENCOUNTER — Ambulatory Visit (INDEPENDENT_AMBULATORY_CARE_PROVIDER_SITE_OTHER): Payer: Medicare Other

## 2022-03-13 DIAGNOSIS — E782 Mixed hyperlipidemia: Secondary | ICD-10-CM

## 2022-03-13 DIAGNOSIS — I2510 Atherosclerotic heart disease of native coronary artery without angina pectoris: Secondary | ICD-10-CM

## 2022-03-13 DIAGNOSIS — Z794 Long term (current) use of insulin: Secondary | ICD-10-CM | POA: Diagnosis not present

## 2022-03-13 DIAGNOSIS — E119 Type 2 diabetes mellitus without complications: Secondary | ICD-10-CM | POA: Diagnosis not present

## 2022-03-13 LAB — MYOCARDIAL PERFUSION IMAGING
LV dias vol: 135 mL (ref 62–150)
LV sys vol: 65 mL
Nuc Stress EF: 52 %
Peak HR: 86 {beats}/min
Rest HR: 70 {beats}/min
Rest Nuclear Isotope Dose: 10.8 mCi
SDS: 4
SRS: 1
SSS: 5
Stress Nuclear Isotope Dose: 31.1 mCi
TID: 1.04

## 2022-03-13 MED ORDER — TECHNETIUM TC 99M TETROFOSMIN IV KIT
10.8000 | PACK | Freq: Once | INTRAVENOUS | Status: AC | PRN
  Administered 2022-03-13: 10.8 via INTRAVENOUS

## 2022-03-13 MED ORDER — REGADENOSON 0.4 MG/5ML IV SOLN
0.4000 mg | Freq: Once | INTRAVENOUS | Status: AC
  Administered 2022-03-13: 0.4 mg via INTRAVENOUS

## 2022-03-13 MED ORDER — TECHNETIUM TC 99M TETROFOSMIN IV KIT
31.1000 | PACK | Freq: Once | INTRAVENOUS | Status: AC | PRN
  Administered 2022-03-13: 31.1 via INTRAVENOUS

## 2022-03-26 ENCOUNTER — Telehealth: Payer: Self-pay | Admitting: Cardiology

## 2022-03-26 NOTE — Telephone Encounter (Signed)
Will forward to pre op provider to review if the pt has been cleared by Dr. Tomie China

## 2022-03-26 NOTE — Telephone Encounter (Signed)
Calling to f/u on Clearance for pt. Please advise

## 2022-03-26 NOTE — Telephone Encounter (Signed)
   Name: Kyle [name] DOB: July 16, 1961  MRN: [id number]  Primary Cardiologist: None  Chart reviewed as part of pre-operative protocol coverage.   Pt seen by Dr. Tomie China 03/05/22 for surgical clearance. This is his assessment and plan: ASSESSMENT:     1. Coronary artery disease involving native coronary artery of native heart without angina pectoris   2. Mixed hyperlipidemia   3. Type 2 diabetes mellitus without complication, with long-term current use of insulin (HCC)     PLAN:     In order of problems listed above:   Coronary artery disease: Secondary prevention stressed with the patient.  Importance of compliance with diet and medication stressed he vocalized understanding. Essential hypertension: Blood pressure stable and diet was emphasized. Preoperative risk stratification from cardiovascular standpoint: Patient has multiple risk factors for coronary artery disease and leads a sedentary lifestyle.  To assess him for this elective surgery I suggested Lexiscan sestamibi and he is agreeable.  We will get this scheduled in the next few days.  If this is negative then he is not at high risk for coronary events during the aforementioned surgery.  Meticulous hemodynamic monitoring will further reduce the risk of coronary events. Diabetes mellitus and obesity: Managed by primary care.  Weight reduction stressed.  These issues and optimization of diabetes will be followed by primary care. Patient will be seen in follow-up appointment in 6 months or earlier if the patient has any concerns    GATED SPECT MYO PERF W/LEXISCAN STRESS 1D 03/13/2022   The study is normal. The study is low risk.   Left ventricular function is normal. Nuclear stress EF: 52 %.  End diastolic cavity size is normal.   Prior study available for comparison from 12/28/2020.   Recommendations As noted above, the patient's stress test is low risk.  According to Dr. Kem Parkinson note, if the stress test is unremarkable, the  patient can proceed with planned surgery. The patient may proceed with planned surgery at acceptable risk.     Please call with questions. Tereso Newcomer, PA-C 03/26/2022, 3:53 PM

## 2022-03-26 NOTE — Telephone Encounter (Signed)
Notes faxed to surgeon. This phone note will be removed from the preop pool. Tereso Newcomer, PA-C  03/26/2022 3:59 PM

## 2022-04-02 NOTE — Progress Notes (Unsigned)
Name: Kyle [name]  MRN/ DOB: [id number], 27-Jul-1961   Age/ Sex: 61 y.o., male    PCP: [name], MD   Reason for Endocrinology Evaluation: Type 2 Diabetes Mellitus     Date of Initial Endocrinology Visit: 04/03/2022     PATIENT IDENTIFIER: Mr. Kyle [name] is a 61 y.o. male with a past medical history of HTn, CAD, Asthma, GERD, and T2DM. The patient presented for initial endocrinology clinic visit on 04/03/2022 for consultative assistance with his diabetes management.    HPI: Kyle [name] was    Diagnosed with DM years ago  Prior Medications tried/Intolerance: as listed  Currently checking blood sugars 1 x / day Hypoglycemia episodes : no                Hemoglobin A1c has ranged from 8.5% in 2017, peaking at 13.7% in 2022. Patient has required hospitalization within the last 1 year from hyper or hypoglycemia: no  In terms of diet, the patient eats 2 meals a day ( Lunch and supper) , doesn't;t snack much. Avoids sugar-sweetened beverages   No recent UTI Has a motorolla phone  Pending colon cancer sx through [facility] with Dr. Elder Love   HOME DIABETES REGIMEN: Metformin 500 mg, 2 tabs BID  Glipizide 10 mg BID Novolog 15-20 units with each meal  Basaglar 40 units daily    Statin: yes ACE-I/ARB: no    METER DOWNLOAD SUMMARY: Did not bing    DIABETIC COMPLICATIONS: Microvascular complications:  neuropathy Denies: CKD, retinopathy  Last eye exam: Completed 2022  Macrovascular complications:  CAD Denies: CVA   PAST HISTORY: Past Medical History:  Past Medical History:  Diagnosis Date   Anemia of unknown etiology    Anxiety disorder    Arthritis    ATHEROSLERO NATV ART EXTREM W/INTERMIT CLAUDICAT 01/26/2009   Qualifier: Diagnosis of  By: Valinda Party RN, Nancy     Back pain of lumbar region with sciatica 01/31/2021   Benign prostatic hyperplasia with urinary frequency    CHEST PAIN UNSPECIFIED 01/26/2009   Qualifier: Diagnosis of  By: Valinda Party RN, Harriett Sine     Class 2  severe obesity due to excess calories with serious comorbidity and body mass index (BMI) of 38.0 to 38.9 in adult [facility])    CLAUDICATION 01/26/2009   Qualifier: Diagnosis of  By: Valinda Party RN, Harriett Sine     Coronary artery disease    minimal CAD '12; NL stress test 06/10/14 ([facility])   Coronary artery disease of native artery of native heart with stable angina pectoris (HCC)    Degeneration of intervertebral disc of lumbosacral region 08/23/2015   Degenerative disc disease, lumbar    Degenerative lumbar disc 11/01/2020   Formatting of this note might be different from the original. Was recommended back surgery. Holding out.   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   Qualifier: Diagnosis of  By: Valinda Party RN, Nancy     Elevated alkaline phosphatase level 11/27/2020   Epididymoorchitis 12/31/2020   Essential (primary) hypertension 08/23/2015   Family history of adverse reaction to anesthesia    "it made my son sick"   Generalized abdominal pain 08/23/2015   GERD (gastroesophageal reflux disease)    History of arthroplasty of right knee  Hyperlipemia    Iron deficiency anemia due to chronic blood loss 11/07/2020   Metatarsal deformity 11/01/2013   Mild intermittent asthma without complication    Mixed hyperlipidemia    Moderate episode of recurrent major depressive disorder (HCC)    Morbid obesity (HCC) 08/23/2015   Myocardial infarction (HCC)    2012; hospitalized [facility] for chest pain syndrome 02/2011 and LHC showed trivial CAD   Pain in lower limb 12/07/2013   Plantar fasciitis of left foot 11/01/2013   Pneumonia 08/2016   surgery had to be rescheduled due to pneumonia   Primary localized osteoarthritis of left knee 10/14/2016   Primary osteoarthritis involving multiple joints 11/01/2020    Formatting of this note might be different from the original. both knees replaced.   Primary osteoarthritis of left knee 09/06/2016   Primary osteoarthritis of right knee 09/30/2015   Psoriasis    Sepsis, unspecified organism (HCC) 10/20/2015   Type 2 diabetes mellitus without complication, with long-term current use of insulin (HCC) 11/01/2013   Type 2 diabetes mellitus without complications (HCC) 08/23/2015   Uncomplicated opioid dependence (HCC)    Vitamin B12 deficiency 11/02/2020   Past Surgical History:  Past Surgical History:  Procedure Laterality Date   CARDIAC CATHETERIZATION     03/28/11 LHC: NL LM, LAD; 10% mCX, mRCA. EF 65%. (Dr. Dot Been, [facility])   CHOLECYSTECTOMY     CORONARY ANGIOPLASTY     2012 [facility]    ESOPHAGEAL DILATION     SEVERAL TIMES   Heel Spur Resection with Fasiotomy Left 11/25/2013   @ [facility]   KNEE ARTHROSCOPY Bilateral    SHOULDER ARTHROSCOPY WITH OPEN ROTATOR CUFF REPAIR Right    TOTAL KNEE ARTHROPLASTY Right 10/04/2015   Procedure: RIGHT TOTAL KNEE ARTHROPLASTY;  Surgeon: Gean Birchwood, MD;  Location: [facility] OR;  Service: Orthopedics;  Laterality: Right;   TOTAL KNEE ARTHROPLASTY Left 10/14/2016   TOTAL KNEE ARTHROPLASTY Left 10/14/2016   Procedure: LEFT TOTAL KNEE ARTHROPLASTY;  Surgeon: Gean Birchwood, MD;  Location: [facility] OR;  Service: Orthopedics;  Laterality: Left;   TUMOR REMOVAL     FATTY TUMOR  RT SHOULDER    Social History:  reports that he has never smoked. He has never used smokeless tobacco. He reports that he does not drink alcohol and does not use drugs. Family History:  Family History  Problem Relation Age of Onset   Cancer Mother    Diabetes Mother    Hyperlipidemia Father    Hypertension Father    Heart failure Father    Heart failure Brother      HOME MEDICATIONS: Allergies as of 04/03/2022       Reactions   Duloxetine    Other reaction(s): Mental Status Changes (intolerance)   Penicillins Hives, Swelling, Rash   Has patient had a PCN  reaction causing immediate rash, facial/tongue/throat swelling, SOB or lightheadedness with hypotension: No Has patient had a PCN reaction causing severe rash involving mucus membranes or skin necrosis: No Has patient had a PCN reaction that required hospitalization No Has patient had a PCN reaction occurring within the last 10 years: No If all of the above answers are "NO", then may proceed with Cephalosporin use.   Cymbalta [duloxetine Hcl] Other (See Comments)   "makes me crazy" depression        Medication List        Accurate as of April 03, 2022 12:55 PM. If you have any questions, ask your nurse or doctor.  STOP taking these medications    Basaglar KwikPen 100 UNIT/ML Replaced by: Toujeo SoloStar 300 UNIT/ML Solostar Pen Stopped by: Scarlette Shorts, MD   glipiZIDE 10 MG tablet Commonly known as: GLUCOTROL Stopped by: Scarlette Shorts, MD       TAKE these medications    albuterol 108 (90 Base) MCG/ACT inhaler Commonly known as: VENTOLIN HFA Inhale 2 puffs into the lungs every 4 (four) hours.   aspirin 325 MG tablet Take 325 mg by mouth daily.   Buprenorphine HCl-Naloxone HCl 8-2 MG Film Place 2 Film under the tongue daily.   buPROPion 100 MG tablet Commonly known as: WELLBUTRIN Take 1 tablet by mouth daily.   busPIRone 10 MG tablet Commonly known as: BUSPAR Take 10 mg by mouth 2 (two) times daily.   Dexcom G7 Sensor Misc 1 Device by Does not apply route as directed. Started by: Scarlette Shorts, MD   empagliflozin 10 MG Tabs tablet Commonly known as: Jardiance Take 1 tablet (10 mg total) by mouth daily before breakfast. Started by: Scarlette Shorts, MD   fenofibrate 48 MG tablet Commonly known as: TRICOR Take 48 mg by mouth daily.   gabapentin 300 MG capsule Commonly known as: NEURONTIN Take 600 mg by mouth 3 (three) times daily.   halobetasol 0.05 % ointment Commonly known as: ULTRAVATE Apply 1 application  topically 2 (two) times daily.   insulin aspart 100 UNIT/ML FlexPen Commonly known as: NOVOLOG Max daily 45 units What changed:  how much to take how to take this when to take this additional instructions Changed by: Scarlette Shorts, MD   metFORMIN 500 MG tablet Commonly known as: GLUCOPHAGE Take 2 tablets (1,000 mg total) by mouth 2 (two) times daily.   metoprolol succinate 25 MG 24 hr tablet Commonly known as: TOPROL-XL Take 25 mg by mouth daily.   nitroGLYCERIN 0.4 MG SL tablet Commonly known as: NITROSTAT Place 1 tablet (0.4 mg total) under the tongue every 5 (five) minutes as needed for chest pain.   omeprazole 40 MG capsule Commonly known as: PRILOSEC Take 40 mg by mouth daily.   PARoxetine 40 MG tablet Commonly known as: PAXIL Take 40 mg by mouth daily.   ranolazine 500 MG 12 hr tablet Commonly known as: Ranexa Take 1 tablet (500 mg total) by mouth 2 (two) times daily.   rosuvastatin 20 MG tablet Commonly known as: CRESTOR Take 20 mg by mouth at bedtime.   tamsulosin 0.4 MG Caps capsule Commonly known as: FLOMAX Take 0.8 mg by mouth daily.   Toujeo SoloStar 300 UNIT/ML Solostar Pen Generic drug: insulin glargine (1 Unit Dial) Inject 50 Units into the skin daily in the afternoon. Replaces: Basaglar KwikPen 100 UNIT/ML Started by: Scarlette Shorts, MD   triamcinolone cream 0.1 % Commonly known as: KENALOG Apply 1 application topically 2 (two) times daily as needed for rash.         ALLERGIES: Allergies  Allergen Reactions   Duloxetine     Other reaction(s): Mental Status Changes (intolerance)   Penicillins Hives, Swelling and Rash    Has patient had a PCN reaction causing immediate rash, facial/tongue/throat swelling, SOB or lightheadedness with hypotension: No Has patient had a PCN reaction causing severe rash involving mucus membranes or skin necrosis: No Has patient had a PCN reaction that required hospitalization No Has patient had  a PCN reaction occurring within the last 10 years: No If all of the above answers are "NO", then may proceed with  Cephalosporin use.    Cymbalta [Duloxetine Hcl] Other (See Comments)    "makes me crazy" depression     REVIEW OF SYSTEMS: A comprehensive ROS was conducted with the patient and is negative except as per HPI and below:  Review of Systems  Gastrointestinal:  Negative for diarrhea, nausea and vomiting.      OBJECTIVE:   VITAL SIGNS: BP 122/84 (BP Location: Left Arm, Patient Position: Sitting, Cuff Size: Small)   Pulse 81   Ht 5\' 8"  (1.727 m)   Wt 246 lb (111.6 kg)   SpO2 94%   BMI 37.40 kg/m    PHYSICAL EXAM:  General: Pt appears well and is in NAD  Neck: General: Supple without adenopathy or carotid bruits. Thyroid: Thyroid size normal.  No goiter or nodules appreciated.   Lungs: Clear with good BS bilat with no rales, rhonchi, or wheezes  Heart: RRR   Abdomen:  soft, nontender  Extremities:  Lower extremities - No pretibial edema. No lesions.  Neuro: MS is good with appropriate affect, pt is alert and Ox3     DATA REVIEWED:  Lab Results  Component Value Date   HGBA1C 9.2 (H) 10/14/2016   HGBA1C 8.8 (H) 09/03/2016   HGBA1C 7.8 (H) 10/04/2015     06/01/2021 A1c 13.7% LDL 73 Tg 363 HDL 32 Bun/Cr 14/1.32 GFR 62    02/28/2022 BUN 16 CR 1.23 Calcium 10 GFR 67 A1c 12.7%  In office Bg 312 mg/dL   ASSESSMENT / PLAN / RECOMMENDATIONS:   1) Type 2 Diabetes Mellitus, Poorly controlled, With neuropathic and macrovascular complications - Most recent A1c of 12.7 %. Goal A1c < 7.0 %.    Plan: GENERAL: I have discussed with the patient the pathophysiology of diabetes. We went over the natural progression of the disease. We talked about both insulin resistance and insulin deficiency. We stressed the importance of lifestyle changes including diet and exercise. I explained the complications associated with diabetes including retinopathy, nephropathy,  neuropathy as well as increased risk of cardiovascular disease. We went over the benefit seen with glycemic control.   I explained to the patient that diabetic patients are at higher than normal risk for amputations.  He has a pending: Surgery but unfortunately with his A1c of 12.7%, he is at high risk for developing complications and infections, I did explain this to the patient Given his cardiac history, the patient will benefit from SGLT2 inhibitors I will stop his glipizide I will also try to switch Basaglar to [facility] as this more concentrated and he is needing higher dose of insulin I will also provide him with a correction scale for Humalog to be used before each meal We discussed CGM technology, a prescription for Dexcom G7 has been sent to the pharmacy, he was also given a receiver as well as a sample of a sensor  MEDICATIONS: Stop glipizide Start Jardiance 10 mg, 1 tablet daily Continue metformin 500 mg, 2 tabs twice daily Switch Basaglar to Toujeo and take 50 units once daily Take Humalog 12 units 3 times daily before every meal Correction factor: Humalog (BG -130/25)  EDUCATION / INSTRUCTIONS: BG monitoring instructions: Patient is instructed to check his blood sugars 3 times a day, before each meal. Call [facility] if: BG persistently < 70  I reviewed the Rule of 15 for the treatment of hypoglycemia in detail with the patient. Literature supplied.   2) Diabetic complications:  Eye: Does not have known diabetic retinopathy.  Neuro/ Feet: Does  have known diabetic peripheral neuropathy. Renal: Patient does not have known baseline CKD. He is not on an ACEI/ARB at present.     Follow-up in 1 month     Signed electronically by: Lyndle Herrlich, MD  [facility] Endocrin[facility]  [facility] [address] [place] [place], [place] [address] Phone: [phone] FAX: [phone]   CC: [name], MD [address] [place] [address] Phone: [phone]  Fax: [phone]    Return to Endocrinology clinic as below: Future Appointments  Date Time Provider Department Center  05/08/2022  8:10 AM Rayme Bui, Konrad Dolores, MD [id number] None

## 2022-04-03 ENCOUNTER — Ambulatory Visit (INDEPENDENT_AMBULATORY_CARE_PROVIDER_SITE_OTHER): Payer: Medicare Other | Admitting: Internal Medicine

## 2022-04-03 ENCOUNTER — Encounter: Payer: Self-pay | Admitting: Internal Medicine

## 2022-04-03 VITALS — BP 122/84 | HR 81 | Ht 68.0 in | Wt 246.0 lb

## 2022-04-03 DIAGNOSIS — E1165 Type 2 diabetes mellitus with hyperglycemia: Secondary | ICD-10-CM

## 2022-04-03 DIAGNOSIS — E119 Type 2 diabetes mellitus without complications: Secondary | ICD-10-CM | POA: Insufficient documentation

## 2022-04-03 DIAGNOSIS — E1142 Type 2 diabetes mellitus with diabetic polyneuropathy: Secondary | ICD-10-CM

## 2022-04-03 DIAGNOSIS — R739 Hyperglycemia, unspecified: Secondary | ICD-10-CM

## 2022-04-03 DIAGNOSIS — Z794 Long term (current) use of insulin: Secondary | ICD-10-CM | POA: Insufficient documentation

## 2022-04-03 DIAGNOSIS — E1159 Type 2 diabetes mellitus with other circulatory complications: Secondary | ICD-10-CM

## 2022-04-03 HISTORY — DX: Long term (current) use of insulin: E11.65

## 2022-04-03 HISTORY — DX: Type 2 diabetes mellitus with diabetic polyneuropathy: E11.42

## 2022-04-03 HISTORY — DX: Type 2 diabetes mellitus without complications: E11.9

## 2022-04-03 LAB — POCT GLUCOSE (DEVICE FOR HOME USE): POC Glucose: 312 mg/dl — AB (ref 70–99)

## 2022-04-03 MED ORDER — EMPAGLIFLOZIN 10 MG PO TABS: 10.0000 mg | ORAL_TABLET | Freq: Every day | ORAL | 1 refills | Status: DC

## 2022-04-03 MED ORDER — DEXCOM G7 SENSOR MISC: 1.0000 | 3 refills | Status: DC

## 2022-04-03 MED ORDER — METFORMIN HCL 500 MG PO TABS: 1000.0000 mg | ORAL_TABLET | Freq: Two times a day (BID) | ORAL | 3 refills | Status: AC

## 2022-04-03 MED ORDER — TOUJEO SOLOSTAR 300 UNIT/ML ~~LOC~~ SOPN: 50.0000 [IU] | PEN_INJECTOR | Freq: Every day | SUBCUTANEOUS | 3 refills | Status: AC

## 2022-04-03 MED ORDER — DEXCOM G7 SENSOR MISC: 1.0000 | 3 refills | Status: AC

## 2022-04-03 MED ORDER — INSULIN ASPART 100 UNIT/ML FLEXPEN: PEN_INJECTOR | SUBCUTANEOUS | 3 refills | Status: DC

## 2022-04-03 NOTE — Patient Instructions (Addendum)
STOP Glipizide  START Jardiance 10mg , 1 tablet every morning  Continue Metformin 500 mg , 2 tablets with Breakfast and 2 tablets with Supper  Switch  Basaglar to and take 50 units once daily  Take Humalog 12 units with each meal  Humalog correctional insulin: ADD extra units on insulin to your meal-time Humalog dose if your blood sugars are higher than 155. Use the scale below to help guide you:   Blood sugar before meal Number of units to inject  Less than 155 0 unit  156 -  180 1 units  181 -  205 2 units  206 -  230 3 units  231 -  255 4 units  256 -  280 5 units  281 -  305 6 units  306 -  330 7 units  331 -  355 8 units  356 - 380 9 units      HOW TO TREAT LOW BLOOD SUGARS (Blood sugar LESS THAN 70 MG/DL) Please follow the RULE OF 15 for the treatment of hypoglycemia treatment (when your (blood sugars are less than 70 mg/dL)   STEP 1: Take 15 grams of carbohydrates when your blood sugar is low, which includes:  3-4 GLUCOSE TABS  OR 3-4 OZ OF JUICE OR REGULAR SODA OR ONE TUBE OF GLUCOSE GEL    STEP 2: RECHECK blood sugar in 15 MINUTES STEP 3: If your blood sugar is still low at the 15 minute recheck --> then, go back to STEP 1 and treat AGAIN with another 15 grams of carbohydrates.

## 2022-04-08 ENCOUNTER — Other Ambulatory Visit (HOSPITAL_COMMUNITY): Payer: Self-pay

## 2022-04-08 ENCOUNTER — Telehealth: Payer: Self-pay

## 2022-04-08 MED ORDER — INSULIN LISPRO (1 UNIT DIAL) 100 UNIT/ML (KWIKPEN)
PEN_INJECTOR | SUBCUTANEOUS | 3 refills | Status: DC
Start: 2022-04-08 — End: 2022-08-30

## 2022-04-08 NOTE — Telephone Encounter (Signed)
Novolog switched to [employer] per insurance

## 2022-05-08 ENCOUNTER — Ambulatory Visit: Payer: Self-pay | Admitting: Internal Medicine

## 2022-05-08 NOTE — Progress Notes (Deleted)
Name: Kyle [name]  MRN/ DOB: [id number], Aug 31, 1960   Age/ Sex: 61 y.o., male    PCP: Kyle Mina., MD   Reason for Endocrinology Evaluation: Type 2 Diabetes Mellitus     Date of Initial Endocrinology Visit: 04/03/2022    PATIENT IDENTIFIER: Kyle [name] is a 61 y.o. male with a past medical history of HTn, CAD, Asthma, GERD, and T2DM. The patient presented for initial endocrinology clinic visit on 04/03/2022 for consultative assistance with his diabetes management.    HPI: Mr. Kyle [name] was    Diagnosed with DM years ago  Prior Medications tried/Intolerance: as listed  Currently checking blood sugars 1 x / day Hypoglycemia episodes : no                Hemoglobin A1c has ranged from 8.5% in 2017, peaking at 13.7% in 2022. Patient has required hospitalization within the last 1 year from hyper or hypoglycemia: no  In terms of diet, the patient eats 2 meals a day ( Lunch and supper) , doesn't;t snack much. Avoids sugar-sweetened beverages   No recent UTI Has a [employer]olla phone  Pending colon cancer sx through [facility] with Kyle [name]   HOME DIABETES REGIMEN: Metformin 500 mg, 2 tabs BID  Glipizide 10 mg BID Novolog 15-20 units with each meal  Basaglar 40 units daily    Statin: yes ACE-I/ARB: no    METER DOWNLOAD SUMMARY: Did not bing    DIABETIC COMPLICATIONS: Microvascular complications:  neuropathy Denies: CKD, retinopathy  Last eye exam: Completed 2022  Macrovascular complications:  CAD Denies: CVA   PAST HISTORY: Past Medical History:  Past Medical History:  Diagnosis Date   Anemia of unknown etiology    Anxiety disorder    Arthritis    ATHEROSLERO NATV ART EXTREM W/INTERMIT CLAUDICAT 01/26/2009   Qualifier: Diagnosis of  By: Evelina Dun RN, Nancy     Back pain of lumbar region with sciatica 01/31/2021   Benign prostatic hyperplasia with urinary frequency    CHEST PAIN UNSPECIFIED 01/26/2009   Qualifier: Diagnosis of  By: Evelina Dun RN, Izora Gala     Class 2  severe obesity due to excess calories with serious comorbidity and body mass index (BMI) of 38.0 to 38.9 in adult [facility])    CLAUDICATION 01/26/2009   Qualifier: Diagnosis of  By: Evelina Dun RN, Izora Gala     Coronary artery disease    minimal CAD '12; NL stress test 06/10/14 ([facility])   Coronary artery disease of native artery of native heart with stable angina pectoris ([place])    Degeneration of intervertebral disc of lumbosacral region 08/23/2015   Degenerative disc disease, lumbar    Degenerative lumbar disc 11/01/2020   Formatting of this note might be different from the original. Was recommended back surgery. Holding out.   Dehydration 01/26/2009   Qualifier: Diagnosis of  By: Evelina Dun RN, Nancy     Depression    Diabetes ([place]) 11/01/2013   Diabetes mellitus without complication ([place])    DYSPNEA 01/26/2009   Qualifier: Diagnosis of  By: Dia Crawford     Dysthymic disorder 01/26/2009   Qualifier: Diagnosis of  By: Evelina Dun RN, Nancy     Elevated alkaline phosphatase level 11/27/2020   Epididymoorchitis 12/31/2020   Essential (primary) hypertension 08/23/2015   Family history of adverse reaction to anesthesia    "it made my son sick"   Generalized abdominal pain 08/23/2015   GERD (gastroesophageal reflux disease)    History of arthroplasty of right knee  Hyperlipemia    Iron deficiency anemia due to chronic blood loss 11/07/2020   Metatarsal deformity 11/01/2013   Mild intermittent asthma without complication    Mixed hyperlipidemia    Moderate episode of recurrent major depressive disorder (HCC)    Morbid obesity (HCC) 08/23/2015   Myocardial infarction (HCC)    2012; hospitalized [facility] for chest pain syndrome 02/2011 and LHC showed trivial CAD   Pain in lower limb 12/07/2013   Plantar fasciitis of left foot 11/01/2013   Pneumonia 08/2016   surgery had to be rescheduled due to pneumonia   Primary localized osteoarthritis of left knee 10/14/2016   Primary osteoarthritis involving multiple joints 11/01/2020    Formatting of this note might be different from the original. both knees replaced.   Primary osteoarthritis of left knee 09/06/2016   Primary osteoarthritis of right knee 09/30/2015   Psoriasis    Sepsis, unspecified organism (HCC) 10/20/2015   Type 2 diabetes mellitus without complication, with long-term current use of insulin (HCC) 11/01/2013   Type 2 diabetes mellitus without complications (HCC) 08/23/2015   Uncomplicated opioid dependence (HCC)    Vitamin B12 deficiency 11/02/2020   Past Surgical History:  Past Surgical History:  Procedure Laterality Date   CARDIAC CATHETERIZATION     03/28/11 LHC: NL LM, LAD; 10% mCX, mRCA. EF 65%. (Dr. Dot Been, [facility])   CHOLECYSTECTOMY     CORONARY ANGIOPLASTY     2012 [facility]    ESOPHAGEAL DILATION     SEVERAL TIMES   Heel Spur Resection with Fasiotomy Left 11/25/2013   @ [facility]   KNEE ARTHROSCOPY Bilateral    SHOULDER ARTHROSCOPY WITH OPEN ROTATOR CUFF REPAIR Right    TOTAL KNEE ARTHROPLASTY Right 10/04/2015   Procedure: RIGHT TOTAL KNEE ARTHROPLASTY;  Surgeon: Gean Birchwood, MD;  Location: [facility] OR;  Service: Orthopedics;  Laterality: Right;   TOTAL KNEE ARTHROPLASTY Left 10/14/2016   TOTAL KNEE ARTHROPLASTY Left 10/14/2016   Procedure: LEFT TOTAL KNEE ARTHROPLASTY;  Surgeon: Gean Birchwood, MD;  Location: [facility] OR;  Service: Orthopedics;  Laterality: Left;   TUMOR REMOVAL     FATTY TUMOR  RT SHOULDER    Social History:  reports that he has never smoked. He has never used smokeless tobacco. He reports that he does not drink alcohol and does not use drugs. Family History:  Family History  Problem Relation Age of Onset   Cancer Mother    Diabetes Mother    Hyperlipidemia Father    Hypertension Father    Heart failure Father    Heart failure Brother      HOME MEDICATIONS: Allergies as of 05/08/2022       Reactions   Duloxetine    Other reaction(s): Mental Status Changes (intolerance)   Penicillins Hives, Swelling, Rash   Has patient had a PCN  reaction causing immediate rash, facial/tongue/throat swelling, SOB or lightheadedness with hypotension: No Has patient had a PCN reaction causing severe rash involving mucus membranes or skin necrosis: No Has patient had a PCN reaction that required hospitalization No Has patient had a PCN reaction occurring within the last 10 years: No If all of the above answers are "NO", then may proceed with Cephalosporin use.   Cymbalta [duloxetine Hcl] Other (See Comments)   "makes me crazy" depression        Medication List        Accurate as of May 08, 2022  8:16 AM. If you have any questions, ask your nurse or doctor.  albuterol 108 (90 Base) MCG/ACT inhaler Commonly known as: VENTOLIN HFA Inhale 2 puffs into the lungs every 4 (four) hours.   aspirin 325 MG tablet Take 325 mg by mouth daily.   Buprenorphine HCl-Naloxone HCl 8-2 MG Film Place 2 Film under the tongue daily.   buPROPion 100 MG tablet Commonly known as: WELLBUTRIN Take 1 tablet by mouth daily.   busPIRone 10 MG tablet Commonly known as: BUSPAR Take 10 mg by mouth 2 (two) times daily.   Dexcom G7 Sensor Misc 1 Device by Does not apply route as directed.   empagliflozin 10 MG Tabs tablet Commonly known as: Jardiance Take 1 tablet (10 mg total) by mouth daily before breakfast.   fenofibrate 48 MG tablet Commonly known as: TRICOR Take 48 mg by mouth daily.   gabapentin 300 MG capsule Commonly known as: NEURONTIN Take 600 mg by mouth 3 (three) times daily.   halobetasol 0.05 % ointment Commonly known as: ULTRAVATE Apply 1 application topically 2 (two) times daily.   insulin lispro 100 UNIT/ML KwikPen Commonly known as: HumaLOG KwikPen Max daily 45 units   metFORMIN 500 MG tablet Commonly known as: GLUCOPHAGE Take 2 tablets (1,000 mg total) by mouth 2 (two) times daily.   metoprolol succinate 25 MG 24 hr tablet Commonly known as: TOPROL-XL Take 25 mg by mouth daily.   nitroGLYCERIN  0.4 MG SL tablet Commonly known as: NITROSTAT Place 1 tablet (0.4 mg total) under the tongue every 5 (five) minutes as needed for chest pain.   omeprazole 40 MG capsule Commonly known as: PRILOSEC Take 40 mg by mouth daily.   PARoxetine 40 MG tablet Commonly known as: PAXIL Take 40 mg by mouth daily.   ranolazine 500 MG 12 hr tablet Commonly known as: Ranexa Take 1 tablet (500 mg total) by mouth 2 (two) times daily.   rosuvastatin 20 MG tablet Commonly known as: CRESTOR Take 20 mg by mouth at bedtime.   tamsulosin 0.4 MG Caps capsule Commonly known as: FLOMAX Take 0.8 mg by mouth daily.   Toujeo SoloStar 300 UNIT/ML Solostar Pen Generic drug: insulin glargine (1 Unit Dial) Inject 50 Units into the skin daily in the afternoon.   triamcinolone cream 0.1 % Commonly known as: KENALOG Apply 1 application topically 2 (two) times daily as needed for rash.         ALLERGIES: Allergies  Allergen Reactions   Duloxetine     Other reaction(s): Mental Status Changes (intolerance)   Penicillins Hives, Swelling and Rash    Has patient had a PCN reaction causing immediate rash, facial/tongue/throat swelling, SOB or lightheadedness with hypotension: No Has patient had a PCN reaction causing severe rash involving mucus membranes or skin necrosis: No Has patient had a PCN reaction that required hospitalization No Has patient had a PCN reaction occurring within the last 10 years: No If all of the above answers are "NO", then may proceed with Cephalosporin use.    Cymbalta [Duloxetine Hcl] Other (See Comments)    "makes me crazy" depression        OBJECTIVE:   VITAL SIGNS: There were no vitals taken for this visit.   PHYSICAL EXAM:  General: Pt appears well and is in NAD  Neck: General: Supple without adenopathy or carotid bruits. Thyroid: Thyroid size normal.  No goiter or nodules appreciated.   Lungs: Clear with good BS bilat with no rales, rhonchi, or wheezes  Heart:  RRR   Abdomen:  soft, nontender  Extremities:  Lower extremities -  No pretibial edema. No lesions.  Neuro: MS is good with appropriate affect, pt is alert and Ox3     DATA REVIEWED:  Lab Results  Component Value Date   HGBA1C 9.2 (H) 10/14/2016   HGBA1C 8.8 (H) 09/03/2016   HGBA1C 7.8 (H) 10/04/2015     06/01/2021 A1c 13.7% LDL 73 Tg 363 HDL 32 Bun/Cr 14/1.32 GFR 62    02/28/2022 BUN 16 CR 1.23 Calcium 10 GFR 67 A1c 12.7%  In office Bg 312 mg/dL   ASSESSMENT / PLAN / RECOMMENDATIONS:   1) Type 2 Diabetes Mellitus, Poorly controlled, With neuropathic and macrovascular complications - Most recent A1c of 12.7 %. Goal A1c < 7.0 %.    Plan: GENERAL: I have discussed with the patient the pathophysiology of diabetes. We went over the natural progression of the disease. We talked about both insulin resistance and insulin deficiency. We stressed the importance of lifestyle changes including diet and exercise. I explained the complications associated with diabetes including retinopathy, nephropathy, neuropathy as well as increased risk of cardiovascular disease. We went over the benefit seen with glycemic control.   I explained to the patient that diabetic patients are at higher than normal risk for amputations.  He has a pending: Surgery but unfortunately with his A1c of 12.7%, he is at high risk for developing complications and infections, I did explain this to the patient Given his cardiac history, the patient will benefit from SGLT2 inhibitors I will stop his glipizide I will also try to switch Basaglar to [facility] as this more concentrated and he is needing higher dose of insulin I will also provide him with a correction scale for Humalog to be used before each meal We discussed CGM technology, a prescription for Dexcom G7 has been sent to the pharmacy, he was also given a receiver as well as a sample of a sensor  MEDICATIONS: Stop glipizide Start Jardiance 10 mg, 1  tablet daily Continue metformin 500 mg, 2 tabs twice daily Switch Basaglar to Toujeo and take 50 units once daily Take Humalog 12 units 3 times daily before every meal Correction factor: Humalog (BG -130/25)  EDUCATION / INSTRUCTIONS: BG monitoring instructions: Patient is instructed to check his blood sugars 3 times a day, before each meal. Call [facility] if: BG persistently < 70  I reviewed the Rule of 15 for the treatment of hypoglycemia in detail with the patient. Literature supplied.   2) Diabetic complications:  Eye: Does not have known diabetic retinopathy.  Neuro/ Feet: Does  have known diabetic peripheral neuropathy. Renal: Patient does not have known baseline CKD. He is not on an ACEI/ARB at present.     Follow-up in 1 month     Signed electronically by: Mack Guise, MD  [facility]  [facility] [address] [place], [place] [address] Phone: [phone] FAX: [phone]   CC: Kyle Mina., MD [place] [address] Phone: [phone]  Fax: [phone]    Return to Endocrinology clinic as below: No future appointments.

## 2022-05-29 ENCOUNTER — Telehealth: Payer: Self-pay

## 2022-05-29 NOTE — Telephone Encounter (Signed)
Dr. Phineas Douglas from [facility] would like to speak with you regarding patient.   Phone: [phone]

## 2022-05-29 NOTE — Telephone Encounter (Signed)
Dr. Phineas Douglas advised that you would give him a call once you finish with patients this afternoon.   CB: [phone]

## 2022-05-29 NOTE — Telephone Encounter (Signed)
Called Dr. Phineas Douglas back on 05/29/2022 at 940 am , left a voice mail for a call back

## 2022-05-29 NOTE — Telephone Encounter (Signed)
Please contact the patient and bring him to see me on November 10 at 1150 (double book)   The patient missed his appointment on October 11th  His surgeon she spoke to me and his sugar continues to be high so I need to see him ASAP    Thanks

## 2022-06-07 ENCOUNTER — Ambulatory Visit: Payer: Self-pay | Admitting: Internal Medicine

## 2022-06-07 NOTE — Progress Notes (Deleted)
Name: Kyle [name]  MRN/ DOB: [id number], 04-23-1961   Age/ Sex: 61 y.o., male    PCP: [name], MD   Reason for Endocrinology Evaluation: Type 2 Diabetes Mellitus     Date of Initial Endocrinology Visit: 04/03/2022    PATIENT IDENTIFIER: Mr. Kyle [name] is a 61 y.o. male with a past medical history of HTn, CAD, Asthma, GERD, and T2DM. The patient presented for initial endocrinology clinic visit on 04/03/2022 for consultative assistance with his diabetes management.    HPI: Mr. Schoneman was    Diagnosed with DM years ago             Hemoglobin A1c has ranged from 8.5% in 2017, peaking at 13.7% in 2022.    On his initial visit to our clinic his A1c was 12.7%, he was on metformin, glipizide, NovoLog and Basaglar.  I stopped glipizide and start him on Jardiance, continued metformin, and adjusted his MDI regimen and provided him with a correction scale as well as a Dexcom prescription  SUBJECTIVE:   During the last visit (04/03/2022): A1c 12.7%   Today (06/07/22): Kyle [name] is here for follow-up on diabetes management.  He MISSED his 1 month follow-up with me on 05/08/2022. He checks his blood sugars *** times daily. The patient has *** had hypoglycemic episodes since the last clinic visit, which typically occur *** x / - most often occuring ***. The patient is *** symptomatic with these episodes, with symptoms of {symptoms; hypoglycemia:[id number]}.     He has a pending colon cancer surgery with Dr. Elder Love   HOME DIABETES REGIMEN: Metformin 500 mg, 2 tabs BID  Jardiance 10 mg daily  Novolog 15-20 units with each meal  Toujeo 40 units daily  Correction factor: Humalog (BG -130/25)    Statin: yes ACE-I/ARB: no    METER DOWNLOAD SUMMARY: Did not bing    DIABETIC COMPLICATIONS: Microvascular complications:  neuropathy Denies: CKD, retinopathy  Last eye exam: Completed 2022  Macrovascular complications:  CAD Denies: CVA   PAST HISTORY: Past Medical History:   Past Medical History:  Diagnosis Date   Anemia of unknown etiology    Anxiety disorder    Arthritis    ATHEROSLERO NATV ART EXTREM W/INTERMIT CLAUDICAT 01/26/2009   Qualifier: Diagnosis of  By: Valinda Party RN, Nancy     Back pain of lumbar region with sciatica 01/31/2021   Benign prostatic hyperplasia with urinary frequency    CHEST PAIN UNSPECIFIED 01/26/2009   Qualifier: Diagnosis of  By: Valinda Party RN, Harriett Sine     Class 2 severe obesity due to excess calories with serious comorbidity and body mass index (BMI) of 38.0 to 38.9 in adult [facility])    CLAUDICATION 01/26/2009   Qualifier: Diagnosis of  By: Valinda Party RN, Harriett Sine     Coronary artery disease    minimal CAD '12; NL stress test 06/10/14 ([facility])   Coronary artery disease of native artery of native heart with stable angina pectoris (HCC)    Degeneration of intervertebral disc of lumbosacral region 08/23/2015   Degenerative disc disease, lumbar    Degenerative lumbar disc 11/01/2020   Formatting of this note might be different from the original. Was recommended back surgery. Holding out.   Dehydration 01/26/2009   Qualifier: Diagnosis of  By: Valinda Party RN, Nancy     Depression    Diabetes [facility]) 11/01/2013   Diabetes mellitus without complication (HCC)    DYSPNEA 01/26/2009   Qualifier: Diagnosis of  By: Madelin Rear  Dysthymic disorder 01/26/2009   Qualifier: Diagnosis of  By: Valinda Party RN, Nancy     Elevated alkaline phosphatase level 11/27/2020   Epididymoorchitis 12/31/2020   Essential (primary) hypertension 08/23/2015   Family history of adverse reaction to anesthesia    "it made my son sick"   Generalized abdominal pain 08/23/2015   GERD (gastroesophageal reflux disease)    History of arthroplasty of right knee    Hyperlipemia    Iron deficiency anemia due to chronic blood loss 11/07/2020   Metatarsal deformity 11/01/2013   Mild intermittent asthma without complication    Mixed hyperlipidemia    Moderate episode of recurrent major depressive  disorder (HCC)    Morbid obesity (HCC) 08/23/2015   Myocardial infarction (HCC)    2012; hospitalized [facility] for chest pain syndrome 02/2011 and LHC showed trivial CAD   Pain in lower limb 12/07/2013   Plantar fasciitis of left foot 11/01/2013   Pneumonia 08/2016   surgery had to be rescheduled due to pneumonia   Primary localized osteoarthritis of left knee 10/14/2016   Primary osteoarthritis involving multiple joints 11/01/2020   Formatting of this note might be different from the original. both knees replaced.   Primary osteoarthritis of left knee 09/06/2016   Primary osteoarthritis of right knee 09/30/2015   Psoriasis    Sepsis, unspecified organism (HCC) 10/20/2015   Type 2 diabetes mellitus without complication, with long-term current use of insulin (HCC) 11/01/2013   Type 2 diabetes mellitus without complications (HCC) 08/23/2015   Uncomplicated opioid dependence (HCC)    Vitamin B12 deficiency 11/02/2020   Past Surgical History:  Past Surgical History:  Procedure Laterality Date   CARDIAC CATHETERIZATION     03/28/11 LHC: NL LM, LAD; 10% mCX, mRCA. EF 65%. (Dr. Dot Been, [facility])   CHOLECYSTECTOMY     CORONARY ANGIOPLASTY     2012 [facility]    ESOPHAGEAL DILATION     SEVERAL TIMES   Heel Spur Resection with Fasiotomy Left 11/25/2013   @ [facility]   KNEE ARTHROSCOPY Bilateral    SHOULDER ARTHROSCOPY WITH OPEN ROTATOR CUFF REPAIR Right    TOTAL KNEE ARTHROPLASTY Right 10/04/2015   Procedure: RIGHT TOTAL KNEE ARTHROPLASTY;  Surgeon: Gean Birchwood, MD;  Location: [facility] OR;  Service: Orthopedics;  Laterality: Right;   TOTAL KNEE ARTHROPLASTY Left 10/14/2016   TOTAL KNEE ARTHROPLASTY Left 10/14/2016   Procedure: LEFT TOTAL KNEE ARTHROPLASTY;  Surgeon: Gean Birchwood, MD;  Location: [facility] OR;  Service: Orthopedics;  Laterality: Left;   TUMOR REMOVAL     FATTY TUMOR  RT SHOULDER    Social History:  reports that he has never smoked. He has never used smokeless tobacco. He reports that he does not drink alcohol and  does not use drugs. Family History:  Family History  Problem Relation Age of Onset   Cancer Mother    Diabetes Mother    Hyperlipidemia Father    Hypertension Father    Heart failure Father    Heart failure Brother      HOME MEDICATIONS: Allergies as of 06/07/2022       Reactions   Duloxetine    Other reaction(s): Mental Status Changes (intolerance)   Penicillins Hives, Swelling, Rash   Has patient had a PCN reaction causing immediate rash, facial/tongue/throat swelling, SOB or lightheadedness with hypotension: No Has patient had a PCN reaction causing severe rash involving mucus membranes or skin necrosis: No Has patient had a PCN reaction that required hospitalization No Has patient had a PCN  reaction occurring within the last 10 years: No If all of the above answers are "NO", then may proceed with Cephalosporin use.   Cymbalta [duloxetine Hcl] Other (See Comments)   "makes me crazy" depression        Medication List        Accurate as of June 07, 2022  6:51 AM. If you have any questions, ask your nurse or doctor.          albuterol 108 (90 Base) MCG/ACT inhaler Commonly known as: VENTOLIN HFA Inhale 2 puffs into the lungs every 4 (four) hours.   aspirin 325 MG tablet Take 325 mg by mouth daily.   Buprenorphine HCl-Naloxone HCl 8-2 MG Film Place 2 Film under the tongue daily.   buPROPion 100 MG tablet Commonly known as: WELLBUTRIN Take 1 tablet by mouth daily.   busPIRone 10 MG tablet Commonly known as: BUSPAR Take 10 mg by mouth 2 (two) times daily.   Dexcom G7 Sensor Misc 1 Device by Does not apply route as directed.   empagliflozin 10 MG Tabs tablet Commonly known as: Jardiance Take 1 tablet (10 mg total) by mouth daily before breakfast.   fenofibrate 48 MG tablet Commonly known as: TRICOR Take 48 mg by mouth daily.   gabapentin 300 MG capsule Commonly known as: NEURONTIN Take 600 mg by mouth 3 (three) times daily.   halobetasol 0.05  % ointment Commonly known as: ULTRAVATE Apply 1 application topically 2 (two) times daily.   insulin lispro 100 UNIT/ML KwikPen Commonly known as: HumaLOG KwikPen Max daily 45 units   metFORMIN 500 MG tablet Commonly known as: GLUCOPHAGE Take 2 tablets (1,000 mg total) by mouth 2 (two) times daily.   metoprolol succinate 25 MG 24 hr tablet Commonly known as: TOPROL-XL Take 25 mg by mouth daily.   nitroGLYCERIN 0.4 MG SL tablet Commonly known as: NITROSTAT Place 1 tablet (0.4 mg total) under the tongue every 5 (five) minutes as needed for chest pain.   omeprazole 40 MG capsule Commonly known as: PRILOSEC Take 40 mg by mouth daily.   PARoxetine 40 MG tablet Commonly known as: PAXIL Take 40 mg by mouth daily.   ranolazine 500 MG 12 hr tablet Commonly known as: Ranexa Take 1 tablet (500 mg total) by mouth 2 (two) times daily.   rosuvastatin 20 MG tablet Commonly known as: CRESTOR Take 20 mg by mouth at bedtime.   tamsulosin 0.4 MG Caps capsule Commonly known as: FLOMAX Take 0.8 mg by mouth daily.   Toujeo SoloStar 300 UNIT/ML Solostar Pen Generic drug: insulin glargine (1 Unit Dial) Inject 50 Units into the skin daily in the afternoon.   triamcinolone cream 0.1 % Commonly known as: KENALOG Apply 1 application topically 2 (two) times daily as needed for rash.         ALLERGIES: Allergies  Allergen Reactions   Duloxetine     Other reaction(s): Mental Status Changes (intolerance)   Penicillins Hives, Swelling and Rash    Has patient had a PCN reaction causing immediate rash, facial/tongue/throat swelling, SOB or lightheadedness with hypotension: No Has patient had a PCN reaction causing severe rash involving mucus membranes or skin necrosis: No Has patient had a PCN reaction that required hospitalization No Has patient had a PCN reaction occurring within the last 10 years: No If all of the above answers are "NO", then may proceed with Cephalosporin use.     Cymbalta [Duloxetine Hcl] Other (See Comments)    "makes me crazy" depression  OBJECTIVE:   VITAL SIGNS: There were no vitals taken for this visit.   PHYSICAL EXAM:  General: Pt appears well and is in NAD  Neck: General: Supple without adenopathy or carotid bruits. Thyroid: Thyroid size normal.  No goiter or nodules appreciated.   Lungs: Clear with good BS bilat with no rales, rhonchi, or wheezes  Heart: RRR   Abdomen:  soft, nontender  Extremities:  Lower extremities - No pretibial edema. No lesions.  Neuro: MS is good with appropriate affect, pt is alert and Ox3     DATA REVIEWED:  Lab Results  Component Value Date   HGBA1C 9.2 (H) 10/14/2016   HGBA1C 8.8 (H) 09/03/2016   HGBA1C 7.8 (H) 10/04/2015     06/01/2021 A1c 13.7% LDL 73 Tg 363 HDL 32 Bun/Cr 14/1.32 GFR 62    02/28/2022 BUN 16 CR 1.23 Calcium 10 GFR 67 A1c 12.7%  In office Bg 312 mg/dL   ASSESSMENT / PLAN / RECOMMENDATIONS:   1) Type 2 Diabetes Mellitus, Poorly controlled, With neuropathic and macrovascular complications - Most recent A1c of 12.7 %. Goal A1c < 7.0 %.    Plan: GENERAL: I have discussed with the patient the pathophysiology of diabetes. We went over the natural progression of the disease. We talked about both insulin resistance and insulin deficiency. We stressed the importance of lifestyle changes including diet and exercise. I explained the complications associated with diabetes including retinopathy, nephropathy, neuropathy as well as increased risk of cardiovascular disease. We went over the benefit seen with glycemic control.   I explained to the patient that diabetic patients are at higher than normal risk for amputations.  He has a pending: Surgery but unfortunately with his A1c of 12.7%, he is at high risk for developing complications and infections, I did explain this to the patient Given his cardiac history, the patient will benefit from SGLT2 inhibitors I will stop  his glipizide I will also try to switch Basaglar to [facility] as this more concentrated and he is needing higher dose of insulin I will also provide him with a correction scale for Humalog to be used before each meal We discussed CGM technology, a prescription for Dexcom G7 has been sent to the pharmacy, he was also given a receiver as well as a sample of a sensor  MEDICATIONS: Start Jardiance 10 mg, 1 tablet daily Continue metformin 500 mg, 2 tabs twice daily Switch Basaglar to Toujeo and take 50 units once daily Take Humalog 12 units 3 times daily before every meal Correction factor: Humalog (BG -130/25)  EDUCATION / INSTRUCTIONS: BG monitoring instructions: Patient is instructed to check his blood sugars 3 times a day, before each meal. Call [facility] if: BG persistently < 70  I reviewed the Rule of 15 for the treatment of hypoglycemia in detail with the patient. Literature supplied.   2) Diabetic complications:  Eye: Does not have known diabetic retinopathy.  Neuro/ Feet: Does  have known diabetic peripheral neuropathy. Renal: Patient does not have known baseline CKD. He is not on an ACEI/ARB at present.     Follow-up in 1 month     Signed electronically by: Lyndle Herrlich, MD  [facility] Endocrinology  [facility] [address] [place], [place] [address] Phone: [phone] FAX: [phone]   CC: [name], MD [address] [place] [address] Phone: [phone]  Fax: [phone]    Return to Endocrinology clinic as below: Future Appointments  Date  Time Provider Department Center  06/07/2022 11:50 AM Sri Clegg, Konrad Dolores, MD [id number] None

## 2022-07-15 ENCOUNTER — Ambulatory Visit: Payer: Medicare Other | Admitting: Internal Medicine

## 2022-07-15 ENCOUNTER — Encounter: Payer: Self-pay | Admitting: Internal Medicine

## 2022-07-15 NOTE — Progress Notes (Deleted)
Name: Kyle [name]  MRN/ DOB: [id number], Apr 29, 1961   Age/ Sex: 61 y.o., male    PCP: [name], MD   Reason for Endocrinology Evaluation: Type 2 Diabetes Mellitus     Date of Initial Endocrinology Visit: 04/03/2022    PATIENT IDENTIFIER: Kyle [name] is a 61 y.o. male with a past medical history of HTn, CAD, Asthma, GERD, and T2DM. The patient presented for initial endocrinology clinic visit on 04/03/2022 for consultative assistance with his diabetes management.    HPI: Kyle [name] was    Diagnosed with DM years ago               Hemoglobin A1c has ranged from 8.5% in 2017, peaking at 13.7% in 2022.  On his initial visit to our clinic and A1c of 12.7%, he was on glipizide, metformin, multiple daily injections of insulin.  I stopped his glipizide, started Jardiance, continue metformin, adjusted MDI regimen and provided him with a correction scale A prescription for Dexcom G7 was also sent    SUBJECTIVE:    Today (07/15/22):  Kyle [name] is here for a follow up on diabetes management. He had "no showed" to his 10/11th appointment and canceled his 11/10th appointment.   He checks his glucose once daily.  Pending colon cancer sx through [facility] with Dr. Elder Love   HOME DIABETES REGIMEN: Metformin 500 mg, 2 tabs BID  Jardiance 10 mg daily Novolog 12 units with each meal  Toujeo 50 units daily  Correction factor: Humalog (BG -130/25)   Statin: yes ACE-I/ARB: no    METER DOWNLOAD SUMMARY: Did not bing    DIABETIC COMPLICATIONS: Microvascular complications:  neuropathy Denies: CKD, retinopathy  Last eye exam: Completed 2022  Macrovascular complications:  CAD Denies: CVA   PAST HISTORY: Past Medical History:  Past Medical History:  Diagnosis Date   Anemia of unknown etiology    Anxiety disorder    Arthritis    ATHEROSLERO NATV ART EXTREM W/INTERMIT CLAUDICAT 01/26/2009   Qualifier: Diagnosis of  By: Valinda Party RN, Nancy     Back pain of lumbar region  with sciatica 01/31/2021   Benign prostatic hyperplasia with urinary frequency    CHEST PAIN UNSPECIFIED 01/26/2009   Qualifier: Diagnosis of  By: Valinda Party RN, Harriett Sine     Class 2 severe obesity due to excess calories with serious comorbidity and body mass index (BMI) of 38.0 to 38.9 in adult [facility])    CLAUDICATION 01/26/2009   Qualifier: Diagnosis of  By: Valinda Party RN, Harriett Sine     Coronary artery disease    minimal CAD '12; NL stress test 06/10/14 ([facility])   Coronary artery disease of native artery of native heart with stable angina pectoris (HCC)    Degeneration of intervertebral disc of lumbosacral region 08/23/2015   Degenerative disc disease, lumbar    Degenerative lumbar disc 11/01/2020   Formatting of this note might be different from the original. Was recommended back surgery. Holding out.   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   Qualifier: Diagnosis of  By: Valinda Party RN, Nancy     Elevated alkaline phosphatase level 11/27/2020   Epididymoorchitis 12/31/2020   Essential (primary) hypertension 08/23/2015   Family history of adverse reaction to anesthesia    "  it made my son sick"   Generalized abdominal pain 08/23/2015   GERD (gastroesophageal reflux disease)    History of arthroplasty of right knee    Hyperlipemia    Iron deficiency anemia due to chronic blood loss 11/07/2020   Metatarsal deformity 11/01/2013   Mild intermittent asthma without complication    Mixed hyperlipidemia    Moderate episode of recurrent major depressive disorder (HCC)    Morbid obesity (HCC) 08/23/2015   Myocardial infarction (HCC)    2012; hospitalized [facility] for chest pain syndrome 02/2011 and LHC showed trivial CAD   Pain in lower limb 12/07/2013   Plantar fasciitis of left foot 11/01/2013   Pneumonia  08/2016   surgery had to be rescheduled due to pneumonia   Primary localized osteoarthritis of left knee 10/14/2016   Primary osteoarthritis involving multiple joints 11/01/2020   Formatting of this note might be different from the original. both knees replaced.   Primary osteoarthritis of left knee 09/06/2016   Primary osteoarthritis of right knee 09/30/2015   Psoriasis    Sepsis, unspecified organism (HCC) 10/20/2015   Type 2 diabetes mellitus without complication, with long-term current use of insulin (HCC) 11/01/2013   Type 2 diabetes mellitus without complications (HCC) 08/23/2015   Uncomplicated opioid dependence (HCC)    Vitamin B12 deficiency 11/02/2020   Past Surgical History:  Past Surgical History:  Procedure Laterality Date   CARDIAC CATHETERIZATION     03/28/11 LHC: NL LM, LAD; 10% mCX, mRCA. EF 65%. (Dr. Dot Been, [facility])   CHOLECYSTECTOMY     CORONARY ANGIOPLASTY     2012 [facility]    ESOPHAGEAL DILATION     SEVERAL TIMES   Heel Spur Resection with Fasiotomy Left 11/25/2013   @ [facility]   KNEE ARTHROSCOPY Bilateral    SHOULDER ARTHROSCOPY WITH OPEN ROTATOR CUFF REPAIR Right    TOTAL KNEE ARTHROPLASTY Right 10/04/2015   Procedure: RIGHT TOTAL KNEE ARTHROPLASTY;  Surgeon: Gean Birchwood, MD;  Location: [facility] OR;  Service: Orthopedics;  Laterality: Right;   TOTAL KNEE ARTHROPLASTY Left 10/14/2016   TOTAL KNEE ARTHROPLASTY Left 10/14/2016   Procedure: LEFT TOTAL KNEE ARTHROPLASTY;  Surgeon: Gean Birchwood, MD;  Location: [facility] OR;  Service: Orthopedics;  Laterality: Left;   TUMOR REMOVAL     FATTY TUMOR  RT SHOULDER    Social History:  reports that he has never smoked. He has never used smokeless tobacco. He reports that he does not drink alcohol and does not use drugs. Family History:  Family History  Problem Relation Age of Onset   Cancer Mother    Diabetes Mother    Hyperlipidemia Father    Hypertension Father    Heart failure Father    Heart failure Brother      HOME  MEDICATIONS: Allergies as of 07/15/2022       Reactions   Duloxetine    Other reaction(s): Mental Status Changes (intolerance)   Penicillins Hives, Swelling, Rash   Has patient had a PCN reaction causing immediate rash, facial/tongue/throat swelling, SOB or lightheadedness with hypotension: No Has patient had a PCN reaction causing severe rash involving mucus membranes or skin necrosis: No Has patient had a PCN reaction that required hospitalization No Has patient had a PCN reaction occurring within the last 10 years: No If all of the above answers are "NO", then may proceed with Cephalosporin use.   Cymbalta [duloxetine Hcl] Other (See Comments)   "makes me crazy" depression        Medication  List        Accurate as of July 15, 2022  7:03 AM. If you have any questions, ask your nurse or doctor.          albuterol 108 (90 Base) MCG/ACT inhaler Commonly known as: VENTOLIN HFA Inhale 2 puffs into the lungs every 4 (four) hours.   aspirin 325 MG tablet Take 325 mg by mouth daily.   Buprenorphine HCl-Naloxone HCl 8-2 MG Film Place 2 Film under the tongue daily.   buPROPion 100 MG tablet Commonly known as: WELLBUTRIN Take 1 tablet by mouth daily.   busPIRone 10 MG tablet Commonly known as: BUSPAR Take 10 mg by mouth 2 (two) times daily.   Dexcom G7 Sensor Misc 1 Device by Does not apply route as directed.   empagliflozin 10 MG Tabs tablet Commonly known as: Jardiance Take 1 tablet (10 mg total) by mouth daily before breakfast.   fenofibrate 48 MG tablet Commonly known as: TRICOR Take 48 mg by mouth daily.   gabapentin 300 MG capsule Commonly known as: NEURONTIN Take 600 mg by mouth 3 (three) times daily.   halobetasol 0.05 % ointment Commonly known as: ULTRAVATE Apply 1 application topically 2 (two) times daily.   insulin lispro 100 UNIT/ML KwikPen Commonly known as: HumaLOG KwikPen Max daily 45 units   metFORMIN 500 MG tablet Commonly known as:  GLUCOPHAGE Take 2 tablets (1,000 mg total) by mouth 2 (two) times daily.   metoprolol succinate 25 MG 24 hr tablet Commonly known as: TOPROL-XL Take 25 mg by mouth daily.   nitroGLYCERIN 0.4 MG SL tablet Commonly known as: NITROSTAT Place 1 tablet (0.4 mg total) under the tongue every 5 (five) minutes as needed for chest pain.   omeprazole 40 MG capsule Commonly known as: PRILOSEC Take 40 mg by mouth daily.   PARoxetine 40 MG tablet Commonly known as: PAXIL Take 40 mg by mouth daily.   ranolazine 500 MG 12 hr tablet Commonly known as: Ranexa Take 1 tablet (500 mg total) by mouth 2 (two) times daily.   rosuvastatin 20 MG tablet Commonly known as: CRESTOR Take 20 mg by mouth at bedtime.   tamsulosin 0.4 MG Caps capsule Commonly known as: FLOMAX Take 0.8 mg by mouth daily.   Toujeo SoloStar 300 UNIT/ML Solostar Pen Generic drug: insulin glargine (1 Unit Dial) Inject 50 Units into the skin daily in the afternoon.   triamcinolone cream 0.1 % Commonly known as: KENALOG Apply 1 application topically 2 (two) times daily as needed for rash.         ALLERGIES: Allergies  Allergen Reactions   Duloxetine     Other reaction(s): Mental Status Changes (intolerance)   Penicillins Hives, Swelling and Rash    Has patient had a PCN reaction causing immediate rash, facial/tongue/throat swelling, SOB or lightheadedness with hypotension: No Has patient had a PCN reaction causing severe rash involving mucus membranes or skin necrosis: No Has patient had a PCN reaction that required hospitalization No Has patient had a PCN reaction occurring within the last 10 years: No If all of the above answers are "NO", then may proceed with Cephalosporin use.    Cymbalta [Duloxetine Hcl] Other (See Comments)    "makes me crazy" depression     REVIEW OF SYSTEMS: A comprehensive ROS was conducted with the patient and is negative except as per HPI   OBJECTIVE:   VITAL SIGNS: There were no  vitals taken for this visit.   PHYSICAL EXAM:  General: Pt  appears well and is in NAD  Neck: General: Supple without adenopathy or carotid bruits. Thyroid: Thyroid size normal.  No goiter or nodules appreciated.   Lungs: Clear with good BS bilat with no rales, rhonchi, or wheezes  Heart: RRR   Abdomen:  soft, nontender  Extremities:  Lower extremities - No pretibial edema. No lesions.  Neuro: MS is good with appropriate affect, pt is alert and Ox3     DATA REVIEWED:  Lab Results  Component Value Date   HGBA1C 9.2 (H) 10/14/2016   HGBA1C 8.8 (H) 09/03/2016   HGBA1C 7.8 (H) 10/04/2015     06/01/2021 A1c 13.7% LDL 73 Tg 363 HDL 32 Bun/Cr 14/1.32 GFR 62    02/28/2022 BUN 16 CR 1.23 Calcium 10 GFR 67 A1c 12.7%  In office Bg 312 mg/dL   ASSESSMENT / PLAN / RECOMMENDATIONS:   1) Type 2 Diabetes Mellitus, Poorly controlled, With neuropathic and macrovascular complications - Most recent A1c of 12.7 %. Goal A1c < 7.0 %.    Plan: GENERAL: I have discussed with the patient the pathophysiology of diabetes. We went over the natural progression of the disease. We talked about both insulin resistance and insulin deficiency. We stressed the importance of lifestyle changes including diet and exercise. I explained the complications associated with diabetes including retinopathy, nephropathy, neuropathy as well as increased risk of cardiovascular disease. We went over the benefit seen with glycemic control.   I explained to the patient that diabetic patients are at higher than normal risk for amputations.  He has a pending: Surgery but unfortunately with his A1c of 12.7%, he is at high risk for developing complications and infections, I did explain this to the patient Given his cardiac history, the patient will benefit from SGLT2 inhibitors I will stop his glipizide I will also try to switch Basaglar to [facility] as this more concentrated and he is needing higher dose of insulin I will  also provide him with a correction scale for Humalog to be used before each meal We discussed CGM technology, a prescription for Dexcom G7 has been sent to the pharmacy, he was also given a receiver as well as a sample of a sensor  MEDICATIONS: Stop glipizide Start Jardiance 10 mg, 1 tablet daily Continue metformin 500 mg, 2 tabs twice daily Switch Basaglar to Toujeo and take 50 units once daily Take Humalog 12 units 3 times daily before every meal Correction factor: Humalog (BG -130/25)  EDUCATION / INSTRUCTIONS: BG monitoring instructions: Patient is instructed to check his blood sugars 3 times a day, before each meal. Call [facility] if: BG persistently < 70  I reviewed the Rule of 15 for the treatment of hypoglycemia in detail with the patient. Literature supplied.   2) Diabetic complications:  Eye: Does not have known diabetic retinopathy.  Neuro/ Feet: Does  have known diabetic peripheral neuropathy. Renal: Patient does not have known baseline CKD. He is not on an ACEI/ARB at present.     Follow-up in 1 month     Signed electronically by: Lyndle Herrlich, MD  [facility]  [facility] [address] [place] [place], [place] [address] Phone: [phone] FAX: [phone]   CC: [name], MD [address] [place] [address] Phone: [phone]  Fax: [phone]    Return to Endocrinology clinic as below: Future Appointments  Date Time Provider Department Center  07/15/2022  7:30 AM Jesi Jurgens, Konrad Dolores, MD [id number] None

## 2022-07-25 ENCOUNTER — Other Ambulatory Visit: Payer: Self-pay

## 2022-07-25 MED ORDER — EMPAGLIFLOZIN 10 MG PO TABS: 10.0000 mg | ORAL_TABLET | Freq: Every day | ORAL | 0 refills | Status: DC

## 2022-08-21 ENCOUNTER — Ambulatory Visit: Payer: Medicare Other | Admitting: Podiatry

## 2022-08-28 ENCOUNTER — Ambulatory Visit: Payer: Medicare Other | Admitting: Podiatry

## 2022-08-30 ENCOUNTER — Telehealth: Payer: Self-pay

## 2022-08-30 MED ORDER — NOVOLOG FLEXPEN 100 UNIT/ML ~~LOC~~ SOPN: PEN_INJECTOR | SUBCUTANEOUS | 3 refills | Status: DC

## 2022-08-30 NOTE — Telephone Encounter (Signed)
Novolog sent instead of humalog

## 2022-08-30 NOTE — Telephone Encounter (Signed)
Insurance will no longer cover Humalog but will cover Novolog. Okay to change?

## 2022-09-04 ENCOUNTER — Ambulatory Visit: Payer: Medicare Other | Admitting: Podiatry

## 2022-09-11 ENCOUNTER — Ambulatory Visit: Payer: Medicare Other | Admitting: Podiatry

## 2022-09-13 ENCOUNTER — Other Ambulatory Visit: Payer: Self-pay | Admitting: Podiatry

## 2022-09-13 ENCOUNTER — Ambulatory Visit (INDEPENDENT_AMBULATORY_CARE_PROVIDER_SITE_OTHER): Payer: Medicare Other

## 2022-09-13 ENCOUNTER — Ambulatory Visit: Payer: Medicare Other | Admitting: Podiatry

## 2022-09-13 DIAGNOSIS — L97513 Non-pressure chronic ulcer of other part of right foot with necrosis of muscle: Secondary | ICD-10-CM

## 2022-09-13 DIAGNOSIS — E1142 Type 2 diabetes mellitus with diabetic polyneuropathy: Secondary | ICD-10-CM | POA: Diagnosis not present

## 2022-09-13 MED ORDER — DOXYCYCLINE HYCLATE 100 MG PO TABS: 100.0000 mg | ORAL_TABLET | Freq: Two times a day (BID) | ORAL | 0 refills | Status: AC

## 2022-09-13 NOTE — Addendum Note (Signed)
Addended by: Ammie Ferrier on: 09/13/2022 12:52 PM   Modules accepted: Orders

## 2022-09-13 NOTE — Progress Notes (Signed)
Subjective:  Patient ID: Kyle [name], male    DOB: 1961-05-31,  MRN: [id number]  Chief Complaint  Patient presents with   Toe Pain    right foot great toe has some redness/possibly a hole-diabetic    62 y.o. male presents with large ulceration on the bottom of the right hallux.  He is not sure how long it has been there.  He has noticed some drainage.  He has not been taking any antibiotics.  He also reports he has colon cancer and has an upcoming surgery for that.  Denies any nausea vomiting fever chills or significant redness in the right great toe.  Patient does have a history of diabetes type 2 with neuropathy.  Past Medical History:  Diagnosis Date   Anemia of unknown etiology    Anxiety disorder    Arthritis    ATHEROSLERO NATV ART EXTREM W/INTERMIT CLAUDICAT 01/26/2009   Qualifier: Diagnosis of  By: Evelina Dun RN, Nancy     Back pain of lumbar region with sciatica 01/31/2021   Benign prostatic hyperplasia with urinary frequency    CHEST PAIN UNSPECIFIED 01/26/2009   Qualifier: Diagnosis of  By: Evelina Dun RN, Nancy     Class 2 severe obesity due to excess calories with serious comorbidity and body mass index (BMI) of 38.0 to 38.9 in adult [facility])    CLAUDICATION 01/26/2009   Qualifier: Diagnosis of  By: Evelina Dun RN, Izora Gala     Coronary artery disease    minimal CAD '12; NL stress test 06/10/14 ([facility])   Coronary artery disease of native artery of native heart with stable angina pectoris ([place])    Degeneration of intervertebral disc of lumbosacral region 08/23/2015   Degenerative disc disease, lumbar    Degenerative lumbar disc 11/01/2020   Formatting of this note might be different from the original. Was recommended back surgery. Holding out.   Dehydration 01/26/2009   Qualifier: Diagnosis of  By: Evelina Dun RN, Nancy     Depression    Diabetes ([place]) 11/01/2013   Diabetes mellitus without complication ([place])    DYSPNEA 01/26/2009   Qualifier: Diagnosis of  By: Evelina Dun RN, Izora Gala     Dysthymic  disorder 01/26/2009   Qualifier: Diagnosis of  By: Evelina Dun RN, Nancy     Elevated alkaline phosphatase level 11/27/2020   Epididymoorchitis 12/31/2020   Essential (primary) hypertension 08/23/2015   Family history of adverse reaction to anesthesia    "it made my son sick"   Generalized abdominal pain 08/23/2015   GERD (gastroesophageal reflux disease)    History of arthroplasty of right knee    Hyperlipemia    Iron deficiency anemia due to chronic blood loss 11/07/2020   Metatarsal deformity 11/01/2013   Mild intermittent asthma without complication    Mixed hyperlipidemia    Moderate episode of recurrent major depressive disorder ([place])    Morbid obesity ([place]) 08/23/2015   Myocardial infarction ([place])    2012; hospitalized [facility] for chest pain syndrome 02/2011 and LHC showed trivial CAD   Pain in lower limb 12/07/2013   Plantar fasciitis of left foot 11/01/2013   Pneumonia 08/2016   surgery had to be rescheduled due to pneumonia   Primary localized osteoarthritis of left knee 10/14/2016   Primary osteoarthritis involving multiple joints 11/01/2020   Formatting of this note might be different from the original. both knees replaced.   Primary osteoarthritis of left knee 09/06/2016   Primary osteoarthritis of right knee 09/30/2015   Psoriasis    Sepsis, unspecified organism ([place])  10/20/2015   Type 2 diabetes mellitus without complication, with long-term current use of insulin ([place]) 11/01/2013   Type 2 diabetes mellitus without complications ([place]) 99991111   Uncomplicated opioid dependence ([place])    Vitamin B12 deficiency 11/02/2020    Allergies  Allergen Reactions   Duloxetine     Other reaction(s): Mental Status Changes (intolerance)   Penicillins Hives, Swelling and Rash    Has patient had a PCN reaction causing immediate rash, facial/tongue/throat swelling, SOB or lightheadedness with hypotension: No Has patient had a PCN reaction causing severe rash involving mucus membranes or skin necrosis: No Has  patient had a PCN reaction that required hospitalization No Has patient had a PCN reaction occurring within the last 10 years: No If all of the above answers are "NO", then may proceed with Cephalosporin use.    Cymbalta [Duloxetine Hcl] Other (See Comments)    "makes me crazy" depression    ROS: Negative except as per HPI above  Objective:  General: AAO x3, NAD  Dermatological: At the plantar aspect of the right hallux there is a very large ulceration present with underlying subcutaneous fat exposed does probe deep to bone.  There is heavy hyperkeratotic tissue surrounding mild malodor mild serous drainage.  No purulence expressed.  Mild edema and erythema of the right hallux.  Wound measures approximately 1.5 x 1 cm x 0.5 cm postdebridement.  Vascular:  Dorsalis Pedis artery and Posterior Tibial artery pedal pulses are 2/4 bilateral.  Capillary fill time < 3 sec to all digits.   Neruologic: Grossly intact via light touch bilateral. Protective threshold intact to all sites bilateral.   Musculoskeletal: No gross boney pedal deformities bilateral. No pain, crepitus, or limitation noted with foot and ankle range of motion bilateral. Muscular strength 5/5 in all groups tested bilateral.  Gait: Unassisted, Nonantalgic.   No images are attached to the encounter.  Radiographs:  Date: 09/13/2022 XR the right foot Weightbearing AP/Lateral/Oblique   Findings: Attention directed the right hallux proximal and distal phalanx there is no obvious evidence of acute osteolysis erosions or soft tissue emphysema to indicate osteomyelitis. Assessment:   1. Right foot ulcer, with necrosis of muscle ([place])   2. DM type 2 with diabetic peripheral neuropathy ([place])      Plan:  Patient was evaluated and treated and all questions answered.  Ulcer plantar aspect of the right hallux with the level of subcutaneous fat tissue with possible necrosis of underlying tendon and fascia -We discussed the etiology  and factors that are a part of the wound healing process.  We also discussed the risk of infection both soft tissue and osteomyelitis from open ulceration.  Discussed the risk of limb loss if this happens or worsens. -Debridement as below. -Dressed with Betadine, DSD. -Continue home dressing changes daily with Betadine and dry sterile gauze and Coban wrap -Continue off-loading with restricted weight bearing. -Vascular testing deferred patient does have good bleeding with debridement and palpable pedal pulses -HgbA1c: 8.6 most recently on August 26, 2022 -Last antibiotics: E Rx for doxycycline 100 mg twice daily for 2 weeks prescribed due to concern for infection possible osteomyelitis -Imaging: x-ray reviewed, shows no signs of erosions, osteolysis, osteomyelitis or emphysema.  However due to deep ulceration but does probe directly to proximal phalanx I recommend MRI of the right foot to evaluate for osteomyelitis.  Procedure: Excisional Debridement of Wound Rationale: Removal of non-viable soft tissue from the wound to promote healing.  Anesthesia: none Post-Debridement Wound Measurements: 1.5 cm  x 1 cm x 0.5 cm  Type of Debridement: Sharp Excisional Tissue Removed: Non-viable soft tissue Depth of Debridement: subcutaneous tissue. Technique: Sharp excisional debridement to bleeding, viable wound base.  Dressing: Dry, sterile, compression dressing. Disposition: Patient tolerated procedure well.   Return in about 2 weeks (around 09/27/2022) for f/u R hallux ulcer.            Everitt Amber, DPM [facility]

## 2022-09-19 LAB — WOUND CULTURE

## 2022-09-27 ENCOUNTER — Other Ambulatory Visit (HOSPITAL_COMMUNITY): Payer: Self-pay

## 2022-10-04 DIAGNOSIS — Z9049 Acquired absence of other specified parts of digestive tract: Secondary | ICD-10-CM

## 2022-10-04 HISTORY — DX: Acquired absence of other specified parts of digestive tract: Z90.49

## 2022-10-15 ENCOUNTER — Telehealth: Payer: Self-pay

## 2022-10-15 ENCOUNTER — Other Ambulatory Visit: Payer: Self-pay | Admitting: Podiatry

## 2022-10-15 NOTE — Telephone Encounter (Signed)
Patient called to follow up on his MRI Information  was refax to [facility] imaging as of 10/15/22 . Patient also states he would like something for his pain in his toes he states the pain is associated with stinging and burning.   Please advise .Marland KitchenMarland Kitchen

## 2022-10-15 NOTE — Progress Notes (Signed)
error 

## 2022-10-16 NOTE — Telephone Encounter (Signed)
Spoke to the patient and he is still taking the Suboxone medication and he is scheduled to see you on Monday 3/25.

## 2022-10-17 NOTE — Telephone Encounter (Signed)
Patient is going to need more refills on his gabapentin since you are changing the dosage and will need a 90 day supply due to his insurance will only cover 90 days.

## 2022-10-18 ENCOUNTER — Other Ambulatory Visit: Payer: Self-pay | Admitting: Podiatry

## 2022-10-18 ENCOUNTER — Telehealth: Payer: Self-pay | Admitting: *Deleted

## 2022-10-18 MED ORDER — GABAPENTIN 300 MG PO CAPS: 300.0000 mg | ORAL_CAPSULE | Freq: Four times a day (QID) | ORAL | 2 refills | Status: DC

## 2022-10-18 NOTE — Telephone Encounter (Addendum)
Patient is wanting to let the physician know that he is taking gabapentin 300 mg x 4 daily, please send to [facility] [address] [place],90 day supply.

## 2022-10-18 NOTE — Progress Notes (Signed)
error 

## 2022-10-18 NOTE — Telephone Encounter (Signed)
Left a voicemail for the patient to call the office back. 

## 2022-10-18 NOTE — Telephone Encounter (Signed)
Patient is wanting to let the physician know that he is taking gabapentin 300 mg x 3 daily, please send to [facility] [address] [place]

## 2022-10-21 ENCOUNTER — Ambulatory Visit (INDEPENDENT_AMBULATORY_CARE_PROVIDER_SITE_OTHER): Payer: Medicare Other | Admitting: Podiatry

## 2022-10-21 ENCOUNTER — Ambulatory Visit: Payer: Medicare Other | Admitting: Podiatry

## 2022-10-21 DIAGNOSIS — L97513 Non-pressure chronic ulcer of other part of right foot with necrosis of muscle: Secondary | ICD-10-CM

## 2022-10-21 DIAGNOSIS — E1142 Type 2 diabetes mellitus with diabetic polyneuropathy: Secondary | ICD-10-CM | POA: Diagnosis not present

## 2022-10-21 DIAGNOSIS — L97522 Non-pressure chronic ulcer of other part of left foot with fat layer exposed: Secondary | ICD-10-CM | POA: Diagnosis not present

## 2022-10-21 MED ORDER — GABAPENTIN 400 MG PO CAPS: 400.0000 mg | ORAL_CAPSULE | Freq: Three times a day (TID) | ORAL | 2 refills | Status: DC

## 2022-10-21 NOTE — Progress Notes (Unsigned)
Subjective:  Patient ID: Kyle [name], male    DOB: [birth year],  MRN: [id number]  No chief complaint on file.   62 y.o. male presents ***  Past Medical History:  Diagnosis Date   Anemia of unknown etiology    Anxiety disorder    Arthritis    ATHEROSLERO NATV ART EXTREM W/INTERMIT CLAUDICAT 01/26/2009   Qualifier: Diagnosis of  By: Evelina Dun RN, Nancy     Back pain of lumbar region with sciatica 01/31/2021   Benign prostatic hyperplasia with urinary frequency    CHEST PAIN UNSPECIFIED 01/26/2009   Qualifier: Diagnosis of  By: Evelina Dun RN, Nancy     Class 2 severe obesity due to excess calories with serious comorbidity and body mass index (BMI) of 38.0 to 38.9 in adult [facility])    CLAUDICATION 01/26/2009   Qualifier: Diagnosis of  By: Evelina Dun RN, Izora Gala     Coronary artery disease    minimal CAD '12; NL stress test 06/10/14 ([facility])   Coronary artery disease of native artery of native heart with stable angina pectoris ([place])    Degeneration of intervertebral disc of lumbosacral region 08/23/2015   Degenerative disc disease, lumbar    Degenerative lumbar disc 11/01/2020   Formatting of this note might be different from the original. Was recommended back surgery. Holding out.   Dehydration 01/26/2009   Qualifier: Diagnosis of  By: Evelina Dun RN, Nancy     Depression    Diabetes ([place]) 11/01/2013   Diabetes mellitus without complication ([place])    DYSPNEA 01/26/2009   Qualifier: Diagnosis of  By: Evelina Dun RN, Izora Gala     Dysthymic disorder 01/26/2009   Qualifier: Diagnosis of  By: Evelina Dun RN, Nancy     Elevated alkaline phosphatase level 11/27/2020   Epididymoorchitis 12/31/2020   Essential (primary) hypertension 08/23/2015   Family history of adverse reaction to anesthesia    "it made my son sick"   Generalized abdominal pain 08/23/2015   GERD (gastroesophageal reflux disease)    History of arthroplasty of right knee    Hyperlipemia    Iron deficiency anemia due to chronic blood loss 11/07/2020   Metatarsal  deformity 11/01/2013   Mild intermittent asthma without complication    Mixed hyperlipidemia    Moderate episode of recurrent major depressive disorder ([place])    Morbid obesity ([place]) 08/23/2015   Myocardial infarction ([place])    2012; hospitalized [facility] for chest pain syndrome 02/2011 and [facility] showed trivial CAD   Pain in lower limb 12/07/2013   Plantar fasciitis of left foot 11/01/2013   Pneumonia 08/2016   surgery had to be rescheduled due to pneumonia   Primary localized osteoarthritis of left knee 10/14/2016   Primary osteoarthritis involving multiple joints 11/01/2020   Formatting of this note might be different from the original. both knees replaced.   Primary osteoarthritis of left knee 09/06/2016   Primary osteoarthritis of right knee 09/30/2015   Psoriasis    Sepsis, unspecified organism ([place]) 10/20/2015   Type 2 diabetes mellitus without complication, with long-term current use of insulin ([place]) 11/01/2013   Type 2 diabetes mellitus without complications ([place]) [id number]   Uncomplicated opioid dependence ([place])    Vitamin B12 deficiency 11/02/2020    Allergies  Allergen Reactions   Duloxetine     Other reaction(s): Mental Status Changes (intolerance)   Penicillins Hives, Swelling and Rash    Has patient had a PCN reaction causing immediate rash, facial/tongue/throat swelling, SOB or lightheadedness with hypotension: No Has patient had a PCN reaction causing  severe rash involving mucus membranes or skin necrosis: No Has patient had a PCN reaction that required hospitalization No Has patient had a PCN reaction occurring within the last 10 years: No If all of the above answers are "NO", then may proceed with Cephalosporin use.    Cymbalta [Duloxetine Hcl] Other (See Comments)    "makes me crazy" depression    ROS: Negative except as per HPI above  Objective:  General: AAO x3, NAD  Dermatological: With inspection and palpation of the right and left lower extremities there are no open sores, no  preulcerative lesions, no rash or signs of infection present. Nails are of normal length thickness and coloration.   Vascular:  Dorsalis Pedis artery and Posterior Tibial artery pedal pulses are 2/4 bilateral.  Capillary fill time < 3 sec to all digits.   Neruologic: Grossly intact via light touch bilateral. Protective threshold intact to all sites bilateral.   Musculoskeletal: No gross boney pedal deformities bilateral. No pain, crepitus, or limitation noted with foot and ankle range of motion bilateral. Muscular strength 5/5 in all groups tested bilateral.  Gait: Unassisted, Nonantalgic.   No images are attached to the encounter.  Radiographs:  Date: *** XR {right/left foot:16097} Weightbearing AP/Lateral/Oblique   Findings: {MP Foot XR:23762::"no fracture, dislocation, swelling or degenerative changes noted"} Assessment:  No diagnosis found.   Plan:  Patient was evaluated and treated and all questions answered.  #*** -***  No follow-ups on file.          Everitt Amber, DPM [facility]

## 2022-10-22 NOTE — Telephone Encounter (Signed)
Patient has been updated thru voice message that medication has been sent to pharmacy on file.

## 2022-11-11 ENCOUNTER — Emergency Department (HOSPITAL_COMMUNITY): Payer: Medicare Other

## 2022-11-11 ENCOUNTER — Inpatient Hospital Stay (HOSPITAL_COMMUNITY)
Admission: EM | Admit: 2022-11-11 | Discharge: 2022-11-16 | DRG: 854 | Disposition: A | Discharge status: Home Health | Payer: Medicare Other | Attending: Internal Medicine | Admitting: Internal Medicine

## 2022-11-11 DIAGNOSIS — N179 Acute kidney failure, unspecified: Secondary | ICD-10-CM

## 2022-11-11 DIAGNOSIS — I10 Essential (primary) hypertension: Secondary | ICD-10-CM | POA: Diagnosis not present

## 2022-11-11 DIAGNOSIS — Z794 Long term (current) use of insulin: Secondary | ICD-10-CM | POA: Diagnosis not present

## 2022-11-11 DIAGNOSIS — J4520 Mild intermittent asthma, uncomplicated: Secondary | ICD-10-CM | POA: Diagnosis present

## 2022-11-11 DIAGNOSIS — I252 Old myocardial infarction: Secondary | ICD-10-CM

## 2022-11-11 DIAGNOSIS — L97529 Non-pressure chronic ulcer of other part of left foot with unspecified severity: Secondary | ICD-10-CM | POA: Diagnosis present

## 2022-11-11 DIAGNOSIS — F418 Other specified anxiety disorders: Secondary | ICD-10-CM | POA: Diagnosis not present

## 2022-11-11 DIAGNOSIS — E1169 Type 2 diabetes mellitus with other specified complication: Secondary | ICD-10-CM | POA: Diagnosis present

## 2022-11-11 DIAGNOSIS — E872 Acidosis, unspecified: Secondary | ICD-10-CM | POA: Diagnosis present

## 2022-11-11 DIAGNOSIS — D638 Anemia in other chronic diseases classified elsewhere: Secondary | ICD-10-CM | POA: Diagnosis present

## 2022-11-11 DIAGNOSIS — E871 Hypo-osmolality and hyponatremia: Secondary | ICD-10-CM | POA: Diagnosis present

## 2022-11-11 DIAGNOSIS — F419 Anxiety disorder, unspecified: Secondary | ICD-10-CM | POA: Diagnosis present

## 2022-11-11 DIAGNOSIS — I96 Gangrene, not elsewhere classified: Secondary | ICD-10-CM

## 2022-11-11 DIAGNOSIS — Z833 Family history of diabetes mellitus: Secondary | ICD-10-CM

## 2022-11-11 DIAGNOSIS — E1142 Type 2 diabetes mellitus with diabetic polyneuropathy: Secondary | ICD-10-CM

## 2022-11-11 DIAGNOSIS — Z6838 Body mass index (BMI) 38.0-38.9, adult: Secondary | ICD-10-CM

## 2022-11-11 DIAGNOSIS — E782 Mixed hyperlipidemia: Secondary | ICD-10-CM | POA: Diagnosis present

## 2022-11-11 DIAGNOSIS — E11621 Type 2 diabetes mellitus with foot ulcer: Secondary | ICD-10-CM | POA: Diagnosis present

## 2022-11-11 DIAGNOSIS — M86171 Other acute osteomyelitis, right ankle and foot: Secondary | ICD-10-CM | POA: Diagnosis present

## 2022-11-11 DIAGNOSIS — A419 Sepsis, unspecified organism: Principal | ICD-10-CM | POA: Diagnosis present

## 2022-11-11 DIAGNOSIS — Z89431 Acquired absence of right foot: Secondary | ICD-10-CM

## 2022-11-11 DIAGNOSIS — F1120 Opioid dependence, uncomplicated: Secondary | ICD-10-CM | POA: Diagnosis present

## 2022-11-11 DIAGNOSIS — D509 Iron deficiency anemia, unspecified: Secondary | ICD-10-CM | POA: Diagnosis present

## 2022-11-11 DIAGNOSIS — L97519 Non-pressure chronic ulcer of other part of right foot with unspecified severity: Secondary | ICD-10-CM | POA: Diagnosis present

## 2022-11-11 DIAGNOSIS — M170 Bilateral primary osteoarthritis of knee: Secondary | ICD-10-CM | POA: Diagnosis present

## 2022-11-11 DIAGNOSIS — E1165 Type 2 diabetes mellitus with hyperglycemia: Secondary | ICD-10-CM | POA: Diagnosis present

## 2022-11-11 DIAGNOSIS — L03032 Cellulitis of left toe: Secondary | ICD-10-CM | POA: Diagnosis present

## 2022-11-11 DIAGNOSIS — E669 Obesity, unspecified: Secondary | ICD-10-CM | POA: Diagnosis present

## 2022-11-11 DIAGNOSIS — L03115 Cellulitis of right lower limb: Secondary | ICD-10-CM | POA: Diagnosis present

## 2022-11-11 DIAGNOSIS — E1152 Type 2 diabetes mellitus with diabetic peripheral angiopathy with gangrene: Secondary | ICD-10-CM | POA: Diagnosis present

## 2022-11-11 DIAGNOSIS — Z1152 Encounter for screening for COVID-19: Secondary | ICD-10-CM

## 2022-11-11 DIAGNOSIS — G8929 Other chronic pain: Secondary | ICD-10-CM | POA: Diagnosis present

## 2022-11-11 DIAGNOSIS — Z83438 Family history of other disorder of lipoprotein metabolism and other lipidemia: Secondary | ICD-10-CM

## 2022-11-11 DIAGNOSIS — Z9861 Coronary angioplasty status: Secondary | ICD-10-CM

## 2022-11-11 DIAGNOSIS — R6520 Severe sepsis without septic shock: Secondary | ICD-10-CM | POA: Diagnosis present

## 2022-11-11 DIAGNOSIS — F32A Depression, unspecified: Secondary | ICD-10-CM | POA: Diagnosis present

## 2022-11-11 DIAGNOSIS — Z79899 Other long term (current) drug therapy: Secondary | ICD-10-CM

## 2022-11-11 DIAGNOSIS — Z8249 Family history of ischemic heart disease and other diseases of the circulatory system: Secondary | ICD-10-CM

## 2022-11-11 DIAGNOSIS — I2510 Atherosclerotic heart disease of native coronary artery without angina pectoris: Secondary | ICD-10-CM | POA: Diagnosis present

## 2022-11-11 DIAGNOSIS — N401 Enlarged prostate with lower urinary tract symptoms: Secondary | ICD-10-CM | POA: Diagnosis present

## 2022-11-11 DIAGNOSIS — Z9049 Acquired absence of other specified parts of digestive tract: Secondary | ICD-10-CM

## 2022-11-11 DIAGNOSIS — J45909 Unspecified asthma, uncomplicated: Secondary | ICD-10-CM | POA: Diagnosis not present

## 2022-11-11 DIAGNOSIS — Z96653 Presence of artificial knee joint, bilateral: Secondary | ICD-10-CM | POA: Diagnosis present

## 2022-11-11 DIAGNOSIS — Z7984 Long term (current) use of oral hypoglycemic drugs: Secondary | ICD-10-CM

## 2022-11-11 DIAGNOSIS — Z880 Allergy status to penicillin: Secondary | ICD-10-CM

## 2022-11-11 DIAGNOSIS — K219 Gastro-esophageal reflux disease without esophagitis: Secondary | ICD-10-CM | POA: Diagnosis present

## 2022-11-11 HISTORY — DX: Gangrene, not elsewhere classified: I96

## 2022-11-11 HISTORY — DX: Acute kidney failure, unspecified: N17.9

## 2022-11-11 LAB — COMPREHENSIVE METABOLIC PANEL
ALT: 13 U/L (ref 0–44)
AST: 21 U/L (ref 15–41)
Albumin: 3.4 g/dL — ABNORMAL LOW (ref 3.5–5.0)
Alkaline Phosphatase: 113 U/L (ref 38–126)
Anion gap: 13 (ref 5–15)
BUN: 17 mg/dL (ref 6–20)
CO2: 25 mmol/L (ref 22–32)
Calcium: 9.4 mg/dL (ref 8.9–10.3)
Chloride: 95 mmol/L — ABNORMAL LOW (ref 98–111)
Creatinine, Ser: 1.6 mg/dL — ABNORMAL HIGH (ref 0.61–1.24)
GFR, Estimated: 49 mL/min — ABNORMAL LOW (ref >60)
Glucose, Bld: 275 mg/dL — ABNORMAL HIGH (ref 70–99)
Potassium: 4.2 mmol/L (ref 3.5–5.1)
Sodium: 133 mmol/L — ABNORMAL LOW (ref 135–145)
Total Bilirubin: 0.5 mg/dL (ref 0.3–1.2)
Total Protein: 8.9 g/dL — ABNORMAL HIGH (ref 6.5–8.1)

## 2022-11-11 LAB — CBC WITH DIFFERENTIAL/PLATELET
Abs Immature Granulocytes: 0.08 10*3/uL — ABNORMAL HIGH (ref 0.00–0.07)
Basophils Absolute: 0.1 10*3/uL (ref 0.0–0.1)
Basophils Relative: 1 %
Eosinophils Absolute: 0.5 10*3/uL (ref 0.0–0.5)
Eosinophils Relative: 4 %
HCT: 35.5 % — ABNORMAL LOW (ref 39.0–52.0)
Hemoglobin: 11 g/dL — ABNORMAL LOW (ref 13.0–17.0)
Immature Granulocytes: 1 %
Lymphocytes Relative: 15 %
Lymphs Abs: 1.9 10*3/uL (ref 0.7–4.0)
MCH: 27.4 pg (ref 26.0–34.0)
MCHC: 31 g/dL (ref 30.0–36.0)
MCV: 88.5 fL (ref 80.0–100.0)
Monocytes Absolute: 1 10*3/uL (ref 0.1–1.0)
Monocytes Relative: 8 %
Neutro Abs: 9.6 10*3/uL — ABNORMAL HIGH (ref 1.7–7.7)
Neutrophils Relative %: 71 %
Platelets: 361 10*3/uL (ref 150–400)
RBC: 4.01 MIL/uL — ABNORMAL LOW (ref 4.22–5.81)
RDW: 14.9 % (ref 11.5–15.5)
WBC: 13.1 10*3/uL — ABNORMAL HIGH (ref 4.0–10.5)
nRBC: 0 % (ref 0.0–0.2)

## 2022-11-11 LAB — PROTIME-INR
INR: 1.2 (ref 0.8–1.2)
Prothrombin Time: 15.4 seconds — ABNORMAL HIGH (ref 11.4–15.2)

## 2022-11-11 LAB — LACTIC ACID, PLASMA: Lactic Acid, Venous: 3.9 mmol/L (ref 0.5–1.9)

## 2022-11-11 LAB — SEDIMENTATION RATE: Sed Rate: 79 mm/hr — ABNORMAL HIGH (ref 0–16)

## 2022-11-11 LAB — C-REACTIVE PROTEIN: CRP: 18.4 mg/dL — ABNORMAL HIGH (ref <1.0)

## 2022-11-11 MED ORDER — ONDANSETRON HCL 4 MG PO TABS: 4.0000 mg | ORAL_TABLET | Freq: Four times a day (QID) | ORAL | Status: DC | PRN

## 2022-11-11 MED ORDER — ACETAMINOPHEN 650 MG RE SUPP: 650.0000 mg | Freq: Four times a day (QID) | RECTAL | Status: DC | PRN

## 2022-11-11 MED ORDER — ACETAMINOPHEN 325 MG PO TABS
650.0000 mg | ORAL_TABLET | Freq: Four times a day (QID) | ORAL | Status: DC | PRN
  Administered 2022-11-14: 650 mg via ORAL
  Filled 2022-11-11: qty 2

## 2022-11-11 MED ORDER — INSULIN GLARGINE-YFGN 100 UNIT/ML ~~LOC~~ SOLN
40.0000 [IU] | Freq: Every day | SUBCUTANEOUS | Status: DC
  Administered 2022-11-12: 40 [IU] via SUBCUTANEOUS
  Filled 2022-11-11 (×3): qty 0.4

## 2022-11-11 MED ORDER — INSULIN ASPART 100 UNIT/ML IJ SOLN: 0.0000 [IU] | Freq: Every day | INTRAMUSCULAR | Status: DC

## 2022-11-11 MED ORDER — HYDROMORPHONE HCL 1 MG/ML IJ SOLN
0.5000 mg | INTRAMUSCULAR | Status: AC | PRN
  Administered 2022-11-12 (×3): 0.5 mg via INTRAVENOUS
  Filled 2022-11-11: qty 1
  Filled 2022-11-11 (×2): qty 0.5

## 2022-11-11 MED ORDER — SODIUM CHLORIDE 0.9 % IV SOLN: 2.0000 g | Freq: Once | INTRAVENOUS | Status: DC

## 2022-11-11 MED ORDER — VANCOMYCIN VARIABLE DOSE PER UNSTABLE RENAL FUNCTION (PHARMACIST DOSING): Status: DC

## 2022-11-11 MED ORDER — HYDROCODONE-ACETAMINOPHEN 5-325 MG PO TABS
1.0000 | ORAL_TABLET | ORAL | Status: DC | PRN
  Administered 2022-11-12 – 2022-11-15 (×14): 2 via ORAL
  Filled 2022-11-11 (×16): qty 2

## 2022-11-11 MED ORDER — VANCOMYCIN HCL 2000 MG/400ML IV SOLN
2000.0000 mg | Freq: Once | INTRAVENOUS | Status: AC
  Administered 2022-11-11: 2000 mg via INTRAVENOUS
  Filled 2022-11-11: qty 400

## 2022-11-11 MED ORDER — SODIUM CHLORIDE 0.9 % IV SOLN
2.0000 g | Freq: Two times a day (BID) | INTRAVENOUS | Status: DC
  Administered 2022-11-12 – 2022-11-16 (×9): 2 g via INTRAVENOUS
  Filled 2022-11-11 (×9): qty 12.5

## 2022-11-11 MED ORDER — INSULIN ASPART 100 UNIT/ML IJ SOLN
0.0000 [IU] | Freq: Three times a day (TID) | INTRAMUSCULAR | Status: DC
  Administered 2022-11-12: 4 [IU] via SUBCUTANEOUS

## 2022-11-11 MED ORDER — VANCOMYCIN HCL IN DEXTROSE 1-5 GM/200ML-% IV SOLN: 1000.0000 mg | Freq: Once | INTRAVENOUS | Status: DC

## 2022-11-11 MED ORDER — METRONIDAZOLE 500 MG/100ML IV SOLN
500.0000 mg | Freq: Once | INTRAVENOUS | Status: AC
  Administered 2022-11-12: 500 mg via INTRAVENOUS
  Filled 2022-11-11: qty 100

## 2022-11-11 MED ORDER — ONDANSETRON HCL 4 MG/2ML IJ SOLN: 4.0000 mg | Freq: Four times a day (QID) | INTRAMUSCULAR | Status: DC | PRN

## 2022-11-11 MED ORDER — HEPARIN SODIUM (PORCINE) 5000 UNIT/ML IJ SOLN
5000.0000 [IU] | Freq: Three times a day (TID) | INTRAMUSCULAR | Status: DC
  Administered 2022-11-12 – 2022-11-16 (×12): 5000 [IU] via SUBCUTANEOUS
  Filled 2022-11-11 (×12): qty 1

## 2022-11-11 MED ORDER — LACTATED RINGERS IV SOLN: INTRAVENOUS | Status: AC

## 2022-11-11 MED ORDER — SENNOSIDES-DOCUSATE SODIUM 8.6-50 MG PO TABS: 1.0000 | ORAL_TABLET | Freq: Every evening | ORAL | Status: DC | PRN

## 2022-11-11 NOTE — ED Provider Notes (Signed)
[place] EMERGENCY DEPARTMENT AT [facility] Provider Note   CSN: [id number] Arrival date & time: 11/11/22  1656     History  Chief Complaint  Patient presents with   Wound Check    Kyle [name] is a 62 y.o. male.  HPI     62 year old male with his CAD, diabetes comes in with chief complaint of foot infection. Patient states that he was seen by Dr. Susa Simmonds, [facility] orthopedics and advised that he come to the ER.  Patient states that he had infection of the left foot 3 weeks ago.  It has started getting better, but then he started developing infection over the right foot over the last week that has worsened over time.  He is not having drainage from the foot that is foul-smelling and he has also seen skin discoloration.  Review of system is negative for any fevers or chills.  Home Medications Prior to Admission medications   Medication Sig Start Date End Date Taking? Authorizing Provider  albuterol (VENTOLIN HFA) 108 (90 Base) MCG/ACT inhaler Inhale 2 puffs into the lungs every 4 (four) hours. 04/20/20   [provider]  aspirin 325 MG tablet Take 325 mg by mouth daily.    [provider]  Buprenorphine HCl-Naloxone HCl 8-2 MG FILM Place 2 Film under the tongue daily.    [provider]  buPROPion (WELLBUTRIN) 100 MG tablet Take 1 tablet by mouth daily. 11/06/20   [provider]  busPIRone (BUSPAR) 10 MG tablet Take 10 mg by mouth 2 (two) times daily. 09/23/20   [provider]  Continuous Blood Gluc Sensor (DEXCOM G7 SENSOR) MISC 1 Device by Does not apply route as directed. 04/03/22   Shamleffer, Konrad Dolores, MD  empagliflozin (JARDIANCE) 10 MG TABS tablet Take 1 tablet (10 mg total) by mouth daily before breakfast. 07/25/22   Shamleffer, Konrad Dolores, MD  fenofibrate (TRICOR) 48 MG tablet Take 48 mg by mouth daily. 11/08/20   [provider]  gabapentin (NEURONTIN) 300 MG capsule Take 1 capsule (300 mg total) by  mouth 4 (four) times daily. 10/18/22   Vivi Barrack, DPM  gabapentin (NEURONTIN) 400 MG capsule Take 1 capsule (400 mg total) by mouth 3 (three) times daily. 10/21/22 01/19/23  Standiford, Jenelle Mages, DPM  halobetasol (ULTRAVATE) 0.05 % ointment Apply 1 application topically 2 (two) times daily.    [provider]  insulin aspart (NOVOLOG FLEXPEN) 100 UNIT/ML FlexPen Max daily 45 units daily 08/30/22   Shamleffer, Konrad Dolores, MD  insulin glargine, 1 Unit Dial, (TOUJEO SOLOSTAR) 300 UNIT/ML Solostar Pen Inject 50 Units into the skin daily in the afternoon. 04/03/22   Shamleffer, Konrad Dolores, MD  metFORMIN (GLUCOPHAGE) 500 MG tablet Take 2 tablets (1,000 mg total) by mouth 2 (two) times daily. 04/03/22   Shamleffer, Konrad Dolores, MD  metoprolol succinate (TOPROL-XL) 25 MG 24 hr tablet Take 25 mg by mouth daily. 09/06/15   [provider]  nitroGLYCERIN (NITROSTAT) 0.4 MG SL tablet Place 1 tablet (0.4 mg total) under the tongue every 5 (five) minutes as needed for chest pain. 02/23/21   Revankar, Aundra Dubin, MD  omeprazole (PRILOSEC) 40 MG capsule Take 40 mg by mouth daily.    [provider]  PARoxetine (PAXIL) 40 MG tablet Take 40 mg by mouth daily. 08/13/16   [provider]  ranolazine (RANEXA) 500 MG 12 hr tablet Take 1 tablet (500 mg total) by mouth 2 (two) times daily. 12/18/20   Revankar,  Aundra Dubin, MD  rosuvastatin (CRESTOR) 20 MG tablet Take 20 mg by mouth at bedtime.  09/06/15   [provider]  tamsulosin (FLOMAX) 0.4 MG CAPS capsule Take 0.8 mg by mouth daily. 11/01/20   [provider]  triamcinolone cream (KENALOG) 0.1 % Apply 1 application topically 2 (two) times daily as needed for rash. 10/25/20   [provider]      Allergies    Duloxetine, Penicillins, and Cymbalta [duloxetine hcl]    Review of Systems   Review of Systems  All other systems reviewed and are negative.   Physical Exam Updated Vital Signs BP 104/66 (BP  Location: Right Arm)   Pulse 91   Temp 98.1 F (36.7 C) (Oral)   Resp 18   SpO2 97%  Physical Exam Vitals and nursing note reviewed.  Constitutional:      Appearance: He is well-developed.  HENT:     Head: Atraumatic.  Cardiovascular:     Rate and Rhythm: Normal rate.  Pulmonary:     Effort: Pulmonary effort is normal.  Musculoskeletal:        General: Swelling present.     Cervical back: Neck supple.     Comments: Gangrenous toes of the right foot noted with erythema in the distal part of the foot  Skin:    General: Skin is warm.     Findings: Erythema present.  Neurological:     Mental Status: He is alert and oriented to person, place, and time.     ED Results / Procedures / Treatments   Labs (all labs ordered are listed, but only abnormal results are displayed) Labs Reviewed  COMPREHENSIVE METABOLIC PANEL - Abnormal; Notable for the following components:      Result Value   Sodium 133 (*)    Chloride 95 (*)    Glucose, Bld 275 (*)    Creatinine, Ser 1.60 (*)    Total Protein 8.9 (*)    Albumin 3.4 (*)    GFR, Estimated 49 (*)    All other components within normal limits  LACTIC ACID, PLASMA - Abnormal; Notable for the following components:   Lactic Acid, Venous 3.9 (*)    All other components within normal limits  PROTIME-INR - Abnormal; Notable for the following components:   Prothrombin Time 15.4 (*)    All other components within normal limits  C-REACTIVE PROTEIN - Abnormal; Notable for the following components:   CRP 18.4 (*)    All other components within normal limits  CBC WITH DIFFERENTIAL/PLATELET - Abnormal; Notable for the following components:   WBC 13.1 (*)    RBC 4.01 (*)    Hemoglobin 11.0 (*)    HCT 35.5 (*)    Neutro Abs 9.6 (*)    Abs Immature Granulocytes 0.08 (*)    All other components within normal limits  SEDIMENTATION RATE - Abnormal; Notable for the following components:   Sed Rate 79 (*)    All other components within normal  limits  CULTURE, BLOOD (ROUTINE X 2)  CULTURE, BLOOD (ROUTINE X 2)  RESP PANEL BY RT-PCR (RSV, FLU A&B, COVID)  RVPGX2  LACTIC ACID, PLASMA  CBC WITH DIFFERENTIAL/PLATELET  APTT    EKG None  Radiology DG Foot Complete Right  Result Date: 11/11/2022 CLINICAL DATA:  Infection. EXAM: RIGHT FOOT COMPLETE - 3 VIEW COMPARISON:  X-ray 09/13/2022 FINDINGS: Soft tissue swelling about the midfoot and great toe. Well corticated plantar and Achilles calcaneal spurs. Mild degenerative changes of the  midfoot. No dislocation. However there is a erosive changes involving the base of the distal phalanx of the great toe in the distal aspect of the proximal phalanx medially which is new from previous. There is also a possible fracture of the distal margin of the proximal phalanx of the great toe. Please correlate for evidence of an open wound. IMPRESSION: Developing erosive changes involving the bone surrounding the interphalangeal joint of the great toe with possible fracture of the distal aspect of the proximal phalanx. Soft tissue swelling. Please correlate for any clinical evidence of infection in addition to trauma. Electronically Signed   By: Karen Kays M.D.   On: 11/11/2022 18:35        The dressing and staff were in he will he has got some gangrene his toes that is fine you can just Procedures .Critical Care  Performed by: Derwood Kaplan, MD Authorized by: Derwood Kaplan, MD   Critical care provider statement:    Critical care time (minutes):  41   Critical care was necessary to treat or prevent imminent or life-threatening deterioration of the following conditions:  Circulatory failure   Critical care was time spent personally by me on the following activities:  Development of treatment plan with patient or surrogate, discussions with consultants, evaluation of patient's response to treatment, examination of patient, ordering and review of laboratory studies, ordering and review of  radiographic studies, ordering and performing treatments and interventions, pulse oximetry, re-evaluation of patient's condition, review of old charts and obtaining history from patient or surrogate     Medications Ordered in ED Medications  lactated ringers infusion (has no administration in time range)  ceFEPIme (MAXIPIME) 2 g in sodium chloride 0.9 % 100 mL IVPB (has no administration in time range)  metroNIDAZOLE (FLAGYL) IVPB 500 mg (has no administration in time range)  vancomycin (VANCOREADY) IVPB 2000 mg/400 mL (has no administration in time range)    ED Course/ Medical Decision Making/ A&P                             Medical Decision Making Amount and/or Complexity of Data Reviewed Labs: ordered. Radiology: ordered. ECG/medicine tests: ordered.  Risk Prescription drug management. Decision regarding hospitalization.   62 year old patient comes in with chief complaint of worsening toe infection.  He has history of diabetes and CAD.  He has had some chronic infection of the feet, left initially than right.  The right side is getting worse, he was advised to come to the emergency room by [facility] orthopedics.  Differential diagnosis for his foot includes dry gangrene, osteomyelitis, cellulitis, severe sepsis.  Initial lab workup reveals elevated lactic acid at 3.9. Patient has mild leukocytosis, elevated sed rate and CRP.  We will start him on antibiotics at this time. Will consult surgical service as well along with medicine for admission.   Final Clinical Impression(s) / ED Diagnoses Final diagnoses:  Gangrene of toe of right foot  Severe sepsis    Rx / DC Orders ED Discharge Orders     None         Derwood Kaplan, MD 11/11/22 2154

## 2022-11-11 NOTE — Hospital Course (Signed)
Kyle [name] is a 62 y.o. male with medical history significant for CAD, insulin-dependent T2DM, HTN, HLD, iron deficiency anemia, asthma, BPH, depression, s/p right colectomy for adenomatous changes, psoriasis, right foot ulcer who is admitted with right foot wound infection with suspected osteomyelitis.

## 2022-11-11 NOTE — ED Notes (Signed)
2nd lactic not drawn at this time, awaiting orders from provider for management.

## 2022-11-11 NOTE — ED Notes (Signed)
Currently awaiting IV team for access for medication administration.

## 2022-11-11 NOTE — ED Notes (Signed)
No answer when called for triage x1 

## 2022-11-11 NOTE — Sepsis Progress Note (Signed)
Elink following for sepsis protocol. 

## 2022-11-11 NOTE — ED Triage Notes (Signed)
Patient with history of diabetes here for evaluation of infection in right foot. Patient states he saw an orthopedist earlier today who told him go to the emergency department for IV antibiotics and possible hospital admission. Patient is alert, oriented, and in no apparent distress at this time.

## 2022-11-11 NOTE — Progress Notes (Signed)
Pharmacy Antibiotic Note  Kyle [name] is a 62 y.o. male for which pharmacy has been consulted for cefepime and vancomycin dosing for sepsis.  Patient with a history of DM. Patient presenting under recommendation from outpt orthopedist that recommended to go to ED for IV abx for rt foot infection.  SCr 1.6 - 0.91 in February per [facility] records WBC 13.1; LA 3.9; T 98.1; HR 91; RR 18  Plan: Metronidazole per MD Cefepime 2g q12hr  Vancomycin 2000 mg once, subsequent dosing as indicated per random vancomycin level until renal function stable and/or improved, at which time scheduled dosing can be considered Trend WBC, Fever, Renal function F/u cultures, clinical course, WBC De-escalate when able     Temp (24hrs), Avg:98.1 F (36.7 C), Min:98.1 F (36.7 C), Max:98.1 F (36.7 C)  Recent Labs  Lab 11/11/22 1800  WBC 13.1*  CREATININE 1.60*  LATICACIDVEN 3.9*    CrCl cannot be calculated (Unknown ideal weight.).    Allergies  Allergen Reactions   Duloxetine     Other reaction(s): Mental Status Changes (intolerance)   Penicillins Hives, Swelling and Rash    Has patient had a PCN reaction causing immediate rash, facial/tongue/throat swelling, SOB or lightheadedness with hypotension: No Has patient had a PCN reaction causing severe rash involving mucus membranes or skin necrosis: No Has patient had a PCN reaction that required hospitalization No Has patient had a PCN reaction occurring within the last 10 years: No If all of the above answers are "NO", then may proceed with Cephalosporin use.    Cymbalta [Duloxetine Hcl] Other (See Comments)    "makes me crazy" depression   Microbiology results: Pending  Thank you for allowing pharmacy to be a part of this patient's care.  Delmar Landau, PharmD, BCPS 11/11/2022 9:50 PM ED Clinical Pharmacist -  [phone]

## 2022-11-11 NOTE — H&P (Signed)
History and Physical    Kymani Hardt INO:[id number] DOB: [birth year] DOA: 11/11/2022  PCP: [name], MD  Patient coming from: Home  I have personally briefly reviewed patient's old medical records in [facility]  Chief Complaint: Right foot infection  HPI: Rasaun Brutto is a 62 y.o. male with medical history significant for CAD, insulin-dependent T2DM, HTN, HLD, iron deficiency anemia, asthma, BPH, depression, s/p right colectomy for adenomatous changes, psoriasis, right foot ulcer who presented to the ED for evaluation of infection to his right foot.  Patient has known right first toe plantar ulceration.  He was last seen by podiatry 10/21/2022 at which time it was felt the ulcer was worsening.  He was also noted to have erythema edema involving the second, third, and fourth toes.  He was recommended to go to the ED for further evaluation including MRI and IV antibiotics however patient did not seek medical care.  Patient states that he was seen by [facility] orthopedics, Dr. Susa Simmonds, today and again recommended to come to the ED for further evaluation which she did today.  He states that the wound appearance to the toes of his right foot have been worsening over the last week.  He has had some erythema on the dorsal aspect over the toes.  He has seen some cleared discharge on his wound gauze.  He denies any fevers, chills, diaphoresis, chest pain, dyspnea, nausea, vomiting, abdominal pain, dysuria.  ED Course  Labs/Imaging on admission: I have personally reviewed following labs and imaging studies.  Initial vitals showed BP 104/66, pulse 91, RR 18, temp 98.1 F, SpO2 97% on room air.  Labs show WBC 13.1, hemoglobin 11.0, platelets 361,000, sodium 133, potassium 4.2, bicarb 25, BUN 17, creatinine 1.60, serum glucose 275, LFTs within normal limits, lactic acid 3.9, ESR 79, CRP 18.4.  Blood cultures ordered and pending.  Right foot x-ray shows erosive changes involving bone surrounding  the interphalangeal joint of the great toe with possible fracture of the distal aspect of the proximal phalanx.  Soft tissue swelling noted.  Portable chest x-ray negative for focal consolidation, edema, effusion.  Patient was given IV vancomycin, cefepime, Flagyl.  The hospitalist service was consulted to admit for further evaluation and management.  Review of Systems: All systems reviewed and are negative except as documented in history of present illness above.   Past Medical History:  Diagnosis Date   Anemia of unknown etiology    Anxiety disorder    Arthritis    ATHEROSLERO NATV ART EXTREM W/INTERMIT CLAUDICAT 01/26/2009   Qualifier: Diagnosis of  By: Valinda Party RN, Nancy     Back pain of lumbar region with sciatica 01/31/2021   Benign prostatic hyperplasia with urinary frequency    CHEST PAIN UNSPECIFIED 01/26/2009   Qualifier: Diagnosis of  By: Valinda Party RN, Nancy     Class 2 severe obesity due to excess calories with serious comorbidity and body mass index (BMI) of 38.0 to 38.9 in adult [facility])    CLAUDICATION 01/26/2009   Qualifier: Diagnosis of  By: Valinda Party RN, Harriett Sine     Coronary artery disease    minimal CAD '12; NL stress test 06/10/14 ([facility])   Coronary artery disease of native artery of native heart with stable angina pectoris (HCC)    Degeneration of intervertebral disc of lumbosacral region 08/23/2015   Degenerative disc disease, lumbar    Degenerative lumbar disc 11/01/2020   Formatting of this note might be different from the original. Was recommended back surgery.  Holding out.   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   Qualifier: Diagnosis of  By: Valinda Party RN, Nancy     Elevated alkaline phosphatase level 11/27/2020   Epididymoorchitis 12/31/2020   Essential  (primary) hypertension 08/23/2015   Family history of adverse reaction to anesthesia    "it made my son sick"   Generalized abdominal pain 08/23/2015   GERD (gastroesophageal reflux disease)    History of arthroplasty of right knee    Hyperlipemia    Iron deficiency anemia due to chronic blood loss 11/07/2020   Metatarsal deformity 11/01/2013   Mild intermittent asthma without complication    Mixed hyperlipidemia    Moderate episode of recurrent major depressive disorder (HCC)    Morbid obesity (HCC) 08/23/2015   Myocardial infarction (HCC)    2012; hospitalized [facility] for chest pain syndrome 02/2011 and LHC showed trivial CAD   Pain in lower limb 12/07/2013   Plantar fasciitis of left foot 11/01/2013   Pneumonia 08/2016   surgery had to be rescheduled due to pneumonia   Primary localized osteoarthritis of left knee 10/14/2016   Primary osteoarthritis involving multiple joints 11/01/2020   Formatting of this note might be different from the original. both knees replaced.   Primary osteoarthritis of left knee 09/06/2016   Primary osteoarthritis of right knee 09/30/2015   Psoriasis    Sepsis, unspecified organism (HCC) 10/20/2015   Type 2 diabetes mellitus without complication, with long-term current use of insulin (HCC) 11/01/2013   Type 2 diabetes mellitus without complications (HCC) 08/23/2015   Uncomplicated opioid dependence (HCC)    Vitamin B12 deficiency 11/02/2020    Past Surgical History:  Procedure Laterality Date   CARDIAC CATHETERIZATION     03/28/11 LHC: NL LM, LAD; 10% mCX, mRCA. EF 65%. (Dr. Dot Been, [facility])   CHOLECYSTECTOMY     CORONARY ANGIOPLASTY     2012 [facility]    ESOPHAGEAL DILATION     SEVERAL TIMES   Heel Spur Resection with Fasiotomy Left 11/25/2013   @ [facility]   KNEE ARTHROSCOPY Bilateral    SHOULDER ARTHROSCOPY WITH OPEN ROTATOR CUFF REPAIR Right    TOTAL KNEE ARTHROPLASTY Right 10/04/2015   Procedure: RIGHT TOTAL KNEE ARTHROPLASTY;  Surgeon: Gean Birchwood, MD;  Location: [facility]  OR;  Service: Orthopedics;  Laterality: Right;   TOTAL KNEE ARTHROPLASTY Left 10/14/2016   TOTAL KNEE ARTHROPLASTY Left 10/14/2016   Procedure: LEFT TOTAL KNEE ARTHROPLASTY;  Surgeon: Gean Birchwood, MD;  Location: [facility] OR;  Service: Orthopedics;  Laterality: Left;   TUMOR REMOVAL     FATTY TUMOR  RT SHOULDER    Social History:  reports that he has never smoked. He has never used smokeless tobacco. He reports that he does not drink alcohol and does not use drugs.  Allergies  Allergen Reactions   Duloxetine     Other reaction(s): Mental Status Changes (intolerance)   Penicillins Hives, Swelling and Rash    Has patient had a PCN reaction causing immediate rash, facial/tongue/throat swelling, SOB or lightheadedness with hypotension: No Has patient had a PCN reaction causing severe rash involving mucus membranes or skin necrosis: No Has patient had a PCN reaction that required hospitalization No Has patient had a PCN reaction occurring  within the last 10 years: No If all of the above answers are "NO", then may proceed with Cephalosporin use.    Cymbalta [Duloxetine Hcl] Other (See Comments)    "makes me crazy" depression    Family History  Problem Relation Age of Onset   Cancer Mother    Diabetes Mother    Hyperlipidemia Father    Hypertension Father    Heart failure Father    Heart failure Brother      Prior to Admission medications   Medication Sig Start Date End Date Taking? Authorizing Provider  albuterol (VENTOLIN HFA) 108 (90 Base) MCG/ACT inhaler Inhale 2 puffs into the lungs every 4 (four) hours. 04/20/20   [provider]  aspirin 325 MG tablet Take 325 mg by mouth daily.    [provider]  Buprenorphine HCl-Naloxone HCl 8-2 MG FILM Place 2 Film under the tongue daily.    [provider]  buPROPion (WELLBUTRIN) 100 MG tablet Take 1 tablet by mouth daily. 11/06/20   [provider]  busPIRone (BUSPAR) 10 MG tablet Take 10 mg by mouth 2  (two) times daily. 09/23/20   [provider]  Continuous Blood Gluc Sensor (DEXCOM G7 SENSOR) MISC 1 Device by Does not apply route as directed. 04/03/22   Shamleffer, Konrad Dolores, MD  empagliflozin (JARDIANCE) 10 MG TABS tablet Take 1 tablet (10 mg total) by mouth daily before breakfast. 07/25/22   Shamleffer, Konrad Dolores, MD  fenofibrate (TRICOR) 48 MG tablet Take 48 mg by mouth daily. 11/08/20   [provider]  gabapentin (NEURONTIN) 300 MG capsule Take 1 capsule (300 mg total) by mouth 4 (four) times daily. 10/18/22   Vivi Barrack, DPM  gabapentin (NEURONTIN) 400 MG capsule Take 1 capsule (400 mg total) by mouth 3 (three) times daily. 10/21/22 01/19/23  Standiford, Jenelle Mages, DPM  halobetasol (ULTRAVATE) 0.05 % ointment Apply 1 application topically 2 (two) times daily.    [provider]  insulin aspart (NOVOLOG FLEXPEN) 100 UNIT/ML FlexPen Max daily 45 units daily 08/30/22   Shamleffer, Konrad Dolores, MD  insulin glargine, 1 Unit Dial, (TOUJEO SOLOSTAR) 300 UNIT/ML Solostar Pen Inject 50 Units into the skin daily in the afternoon. 04/03/22   Shamleffer, Konrad Dolores, MD  metFORMIN (GLUCOPHAGE) 500 MG tablet Take 2 tablets (1,000 mg total) by mouth 2 (two) times daily. 04/03/22   Shamleffer, Konrad Dolores, MD  metoprolol succinate (TOPROL-XL) 25 MG 24 hr tablet Take 25 mg by mouth daily. 09/06/15   [provider]  nitroGLYCERIN (NITROSTAT) 0.4 MG SL tablet Place 1 tablet (0.4 mg total) under the tongue every 5 (five) minutes as needed for chest pain. 02/23/21   Revankar, Aundra Dubin, MD  omeprazole (PRILOSEC) 40 MG capsule Take 40 mg by mouth daily.    [provider]  PARoxetine (PAXIL) 40 MG tablet Take 40 mg by mouth daily. 08/13/16   [provider]  ranolazine (RANEXA) 500 MG 12 hr tablet Take 1 tablet (500 mg total) by mouth 2 (two) times daily. 12/18/20   Revankar, Aundra Dubin, MD  rosuvastatin (CRESTOR) 20 MG tablet Take 20 mg by mouth  at bedtime.  09/06/15   [provider]  tamsulosin (FLOMAX) 0.4 MG CAPS capsule Take 0.8 mg by mouth daily. 11/01/20   [provider]  triamcinolone cream (KENALOG) 0.1 % Apply 1 application topically 2 (two) times daily as needed for rash. 10/25/20   [provider]    Physical Exam: Vitals:  11/11/22 2130 11/11/22 2200 11/11/22 2341 11/12/22 0000  BP: (!) 93/57 104/82  (!) 118/95  Pulse: 82 82  89  Resp:    15  Temp:   98 F (36.7 C) 98.1 F (36.7 C)  TempSrc:      SpO2: 96% 98%  94%   Constitutional: Resting in bed, NAD, calm, comfortable Eyes: EOMI, lids and conjunctivae normal ENMT: Mucous membranes are moist. Posterior pharynx clear of any exudate or lesions.Normal dentition.  Neck: normal, supple, no masses. Respiratory: clear to auscultation bilaterally, no wheezing, no crackles. Normal respiratory effort. No accessory muscle use.  Cardiovascular: Regular rate and rhythm, no murmurs / rubs / gallops. No extremity edema. 2+ pedal pulses. Abdomen: no tenderness, no masses palpated. Musculoskeletal: no clubbing / cyanosis. Good ROM, no contractures. Normal muscle tone.  Skin: Necrotic changes distal right second toe, ulcer plantar surface of right toe as pictured below.  Slight erythema dorsal aspect of distal right foot.  Psoriatic patch extensor surface left arm. Neurologic: Sensation intact. Strength 5/5 in all 4.  Psychiatric:  Alert and oriented x 3. Normal mood.       EKG: Ordered and pending. Assessment/Plan Principal Problem:   Gangrene of toe of right foot Active Problems:   AKI (acute kidney injury)   Essential (primary) hypertension   Coronary artery disease   Mild intermittent asthma without complication   Type 2 diabetes mellitus with diabetic polyneuropathy, with long-term current use of insulin   Seve Monette is a 62 y.o. male with medical history significant for CAD, insulin-dependent T2DM, HTN, HLD, iron deficiency anemia,  asthma, BPH, depression, s/p right colectomy for adenomatous changes, psoriasis, right foot ulcer who is admitted with right foot wound infection with suspected osteomyelitis.  Assessment and Plan: Dry gangrene and ulcer of toes of right foot with superimposed cellulitis and suspected osteomyelitis: Ongoing process over the last several weeks.  Seen by orthopedics, Dr. Susa Simmonds, in the outpatient setting.  Their team will follow in the morning. -Continue IV vancomycin, cefepime, Flagyl -Obtain MRI right foot -Analgesics as needed  Acute kidney injury: Creatinine 1.6 on admission compared to 0.95 on 09/30/22.  Appears euvolemic on admission.  Will trial IV fluid hydration overnight.  Hold nephrotoxic agents.  Repeat labs in AM.  Insulin-dependent type 2 diabetes: Place on Semglee 40 units daily plus SSI.  Hypertension: Hold antihypertensives due to occasional hypotension.  Continue IV fluid hydration.  Coronary artery disease: Stable, denies chest pain.  Continue aspirin and rosuvastatin.  Asthma: Stable.  Continue albuterol as needed.  Iron deficiency anemia: Hemoglobin stable.  Depression/anxiety: Continue clonazepam 1 mg qhs prn.  Remainder of meds pending medication reconciliation.   DVT prophylaxis: heparin injection 5,000 Units Start: 11/11/22 2300 Code Status: Full code, confirmed with patient on admission Family Communication: Discussed with patient, he has discussed with family Disposition Plan: From home, dispo pending clinical progress Consults called: [facility] Severity of Illness: The appropriate patient status for this patient is INPATIENT. Inpatient status is judged to be reasonable and necessary in order to provide the required intensity of service to ensure the patient's safety. The patient's presenting symptoms, physical exam findings, and initial radiographic and laboratory data in the context of their chronic comorbidities is felt to place them at high  risk for further clinical deterioration. Furthermore, it is not anticipated that the patient will be medically stable for discharge from the hospital within 2 midnights of admission.   * I certify that at the point of admission it  is my clinical judgment that the patient will require inpatient hospital care spanning beyond 2 midnights from the point of admission due to high intensity of service, high risk for further deterioration and high frequency of surveillance required.Darreld Mclean MD [facility]  If 7PM-7AM, please contact night-coverage [email or web address]  11/12/2022, 12:19 AM

## 2022-11-11 NOTE — ED Provider Triage Note (Signed)
Emergency Medicine Provider Triage Evaluation Note  Kyle [name] , a 62 y.o. male  was evaluated in triage.  Pt complains of wound infections to both feet but states that it is worse on the right.  Patient states he was evaluated by his orthopedic today who stated he needed a PICC line and sent him to the ED.  Patient has been on doxycycline for an antibiotic at home since yesterday.  He denies associated fever, chills, nausea, vomiting, diarrhea, body aches, chest pain, shortness of breath, or abdominal pain.  States that "I want my PICC line to go home."  Has not required a PICC line before.  Unable to tell me a timeframe of when the wounds first developed but does state that they had resolved until recently.  No fall or injury.  Review of Systems  Positive: See HPI Negative: See HPI  Physical Exam  BP 104/66 (BP Location: Right Arm)   Pulse 91   Temp 98.1 F (36.7 C) (Oral)   Resp 18   SpO2 97%  Gen:   Awake, no distress   Resp:  Normal effort  MSK:   Both feet are bandaged and in walking shoes, 2+ DP and PT pulses bilaterally, normal sensation, stable and steady gait Other:  No calf tenderness bilaterally, regular rate and rhythm  Medical Decision Making  Medically screening exam initiated at 5:50 PM.  Appropriate orders placed.  Kyle [name] was informed that the remainder of the evaluation will be completed by another provider, this initial triage assessment does not replace that evaluation, and the importance of remaining in the ED until their evaluation is complete.     Kyle Lederer, PA-C 11/11/22 1751

## 2022-11-12 ENCOUNTER — Other Ambulatory Visit: Payer: Self-pay

## 2022-11-12 ENCOUNTER — Inpatient Hospital Stay (HOSPITAL_COMMUNITY): Payer: Medicare Other

## 2022-11-12 DIAGNOSIS — I96 Gangrene, not elsewhere classified: Secondary | ICD-10-CM | POA: Diagnosis not present

## 2022-11-12 LAB — CBC
HCT: 29.3 % — ABNORMAL LOW (ref 39.0–52.0)
Hemoglobin: 9.2 g/dL — ABNORMAL LOW (ref 13.0–17.0)
MCH: 27.1 pg (ref 26.0–34.0)
MCHC: 31.4 g/dL (ref 30.0–36.0)
MCV: 86.4 fL (ref 80.0–100.0)
Platelets: 294 10*3/uL (ref 150–400)
RBC: 3.39 MIL/uL — ABNORMAL LOW (ref 4.22–5.81)
RDW: 14.9 % (ref 11.5–15.5)
WBC: 11.1 10*3/uL — ABNORMAL HIGH (ref 4.0–10.5)
nRBC: 0 % (ref 0.0–0.2)

## 2022-11-12 LAB — HIV ANTIBODY (ROUTINE TESTING W REFLEX): HIV Screen 4th Generation wRfx: NONREACTIVE

## 2022-11-12 LAB — GLUCOSE, CAPILLARY
Glucose-Capillary: 163 mg/dL — ABNORMAL HIGH (ref 70–99)
Glucose-Capillary: 175 mg/dL — ABNORMAL HIGH (ref 70–99)
Glucose-Capillary: 237 mg/dL — ABNORMAL HIGH (ref 70–99)
Glucose-Capillary: 225 mg/dL — ABNORMAL HIGH (ref 70–99)
Glucose-Capillary: 164 mg/dL — ABNORMAL HIGH (ref 70–99)

## 2022-11-12 LAB — BASIC METABOLIC PANEL
Anion gap: 10 (ref 5–15)
BUN: 18 mg/dL (ref 6–20)
CO2: 24 mmol/L (ref 22–32)
Calcium: 8.5 mg/dL — ABNORMAL LOW (ref 8.9–10.3)
Chloride: 97 mmol/L — ABNORMAL LOW (ref 98–111)
Creatinine, Ser: 1.22 mg/dL (ref 0.61–1.24)
GFR, Estimated: >60 mL/min (ref >60)
Glucose, Bld: 187 mg/dL — ABNORMAL HIGH (ref 70–99)
Potassium: 3.8 mmol/L (ref 3.5–5.1)
Sodium: 131 mmol/L — ABNORMAL LOW (ref 135–145)

## 2022-11-12 LAB — CBG MONITORING, ED: Glucose-Capillary: 153 mg/dL — ABNORMAL HIGH (ref 70–99)

## 2022-11-12 LAB — RESP PANEL BY RT-PCR (RSV, FLU A&B, COVID)  RVPGX2
Influenza A by PCR: NEGATIVE
Influenza B by PCR: NEGATIVE
Resp Syncytial Virus by PCR: NEGATIVE
SARS Coronavirus 2 by RT PCR: NEGATIVE

## 2022-11-12 LAB — LACTIC ACID, PLASMA: Lactic Acid, Venous: 1.9 mmol/L (ref 0.5–1.9)

## 2022-11-12 LAB — HEMOGLOBIN A1C
Hgb A1c MFr Bld: 7 % — ABNORMAL HIGH (ref 4.8–5.6)
Mean Plasma Glucose: 154.2 mg/dL

## 2022-11-12 LAB — APTT: aPTT: 37 seconds — ABNORMAL HIGH (ref 24–36)

## 2022-11-12 MED ORDER — INSULIN ASPART 100 UNIT/ML IJ SOLN
0.0000 [IU] | Freq: Three times a day (TID) | INTRAMUSCULAR | Status: DC
  Administered 2022-11-12 – 2022-11-13 (×3): 5 [IU] via SUBCUTANEOUS
  Administered 2022-11-13: 3 [IU] via SUBCUTANEOUS
  Administered 2022-11-13 – 2022-11-14 (×2): 5 [IU] via SUBCUTANEOUS
  Administered 2022-11-14 – 2022-11-15 (×3): 3 [IU] via SUBCUTANEOUS
  Administered 2022-11-16: 2 [IU] via SUBCUTANEOUS
  Administered 2022-11-16: 5 [IU] via SUBCUTANEOUS

## 2022-11-12 MED ORDER — INSULIN ASPART 100 UNIT/ML IJ SOLN
10.0000 [IU] | Freq: Three times a day (TID) | INTRAMUSCULAR | Status: DC
  Administered 2022-11-12 – 2022-11-16 (×10): 10 [IU] via SUBCUTANEOUS

## 2022-11-12 MED ORDER — PAROXETINE HCL 20 MG PO TABS
40.0000 mg | ORAL_TABLET | Freq: Every day | ORAL | Status: DC
  Administered 2022-11-12 – 2022-11-16 (×4): 40 mg via ORAL
  Filled 2022-11-12 (×4): qty 2

## 2022-11-12 MED ORDER — ASPIRIN 325 MG PO TABS
325.0000 mg | ORAL_TABLET | Freq: Every day | ORAL | Status: DC
  Administered 2022-11-12 – 2022-11-16 (×4): 325 mg via ORAL
  Filled 2022-11-12 (×4): qty 1

## 2022-11-12 MED ORDER — ALBUTEROL SULFATE (2.5 MG/3ML) 0.083% IN NEBU: 3.0000 mL | INHALATION_SOLUTION | RESPIRATORY_TRACT | Status: DC | PRN

## 2022-11-12 MED ORDER — ROSUVASTATIN CALCIUM 20 MG PO TABS
20.0000 mg | ORAL_TABLET | Freq: Every day | ORAL | Status: DC
  Administered 2022-11-12 – 2022-11-15 (×4): 20 mg via ORAL
  Filled 2022-11-12 (×4): qty 1

## 2022-11-12 MED ORDER — GADOBUTROL 1 MMOL/ML IV SOLN
10.0000 mL | Freq: Once | INTRAVENOUS | Status: AC | PRN
  Administered 2022-11-12: 10 mL via INTRAVENOUS

## 2022-11-12 MED ORDER — BUPRENORPHINE HCL-NALOXONE HCL 8-2 MG SL SUBL
1.0000 | SUBLINGUAL_TABLET | Freq: Every day | SUBLINGUAL | Status: DC
  Administered 2022-11-12 – 2022-11-14 (×2): 1 via SUBLINGUAL
  Filled 2022-11-12 (×4): qty 1

## 2022-11-12 MED ORDER — CLONAZEPAM 1 MG PO TABS
1.0000 mg | ORAL_TABLET | Freq: Every evening | ORAL | Status: DC | PRN
  Administered 2022-11-13 – 2022-11-15 (×3): 1 mg via ORAL
  Filled 2022-11-12 (×4): qty 1

## 2022-11-12 MED ORDER — METOPROLOL SUCCINATE ER 25 MG PO TB24
25.0000 mg | ORAL_TABLET | Freq: Every day | ORAL | Status: DC
  Administered 2022-11-12 – 2022-11-16 (×4): 25 mg via ORAL
  Filled 2022-11-12 (×5): qty 1

## 2022-11-12 MED ORDER — BUPROPION HCL 100 MG PO TABS
100.0000 mg | ORAL_TABLET | Freq: Every day | ORAL | Status: DC
  Administered 2022-11-12 – 2022-11-16 (×4): 100 mg via ORAL
  Filled 2022-11-12 (×5): qty 1

## 2022-11-12 MED ORDER — FERROUS SULFATE 325 (65 FE) MG PO TABS
325.0000 mg | ORAL_TABLET | Freq: Every day | ORAL | Status: DC
  Administered 2022-11-13 – 2022-11-16 (×3): 325 mg via ORAL
  Filled 2022-11-12 (×3): qty 1

## 2022-11-12 MED ORDER — VANCOMYCIN HCL 1250 MG/250ML IV SOLN
1250.0000 mg | INTRAVENOUS | Status: DC
  Administered 2022-11-12 – 2022-11-15 (×4): 1250 mg via INTRAVENOUS
  Filled 2022-11-12 (×4): qty 250

## 2022-11-12 MED ORDER — GABAPENTIN 300 MG PO CAPS
300.0000 mg | ORAL_CAPSULE | Freq: Four times a day (QID) | ORAL | Status: DC
  Administered 2022-11-12 – 2022-11-16 (×14): 300 mg via ORAL
  Filled 2022-11-12 (×16): qty 1

## 2022-11-12 MED ORDER — BUSPIRONE HCL 10 MG PO TABS
10.0000 mg | ORAL_TABLET | Freq: Two times a day (BID) | ORAL | Status: DC
  Administered 2022-11-12 – 2022-11-16 (×8): 10 mg via ORAL
  Filled 2022-11-12 (×8): qty 1

## 2022-11-12 MED ORDER — INSULIN GLARGINE-YFGN 100 UNIT/ML ~~LOC~~ SOLN
50.0000 [IU] | Freq: Every day | SUBCUTANEOUS | Status: DC
  Administered 2022-11-13 – 2022-11-16 (×3): 50 [IU] via SUBCUTANEOUS
  Filled 2022-11-12 (×4): qty 0.5

## 2022-11-12 MED ORDER — PANTOPRAZOLE SODIUM 40 MG PO TBEC
40.0000 mg | DELAYED_RELEASE_TABLET | Freq: Every day | ORAL | Status: DC
  Administered 2022-11-12 – 2022-11-14 (×3): 40 mg via ORAL
  Filled 2022-11-12 (×3): qty 1

## 2022-11-12 MED ORDER — INSULIN ASPART 100 UNIT/ML IJ SOLN: 0.0000 [IU] | Freq: Three times a day (TID) | INTRAMUSCULAR | Status: DC

## 2022-11-12 MED ORDER — TAMSULOSIN HCL 0.4 MG PO CAPS
0.8000 mg | ORAL_CAPSULE | Freq: Every day | ORAL | Status: DC
  Administered 2022-11-12 – 2022-11-16 (×4): 0.8 mg via ORAL
  Filled 2022-11-12 (×4): qty 2

## 2022-11-12 MED ORDER — ADULT MULTIVITAMIN W/MINERALS CH
1.0000 | ORAL_TABLET | Freq: Every day | ORAL | Status: DC
  Administered 2022-11-12 – 2022-11-16 (×4): 1 via ORAL
  Filled 2022-11-12 (×5): qty 1

## 2022-11-12 NOTE — Progress Notes (Signed)
Patient ID: Kyle [name], male   DOB: Sep 02, 1960, 62 y.o.   MRN: [id number] I stopped by to see the patient and he was in the MRI scanner for MRI scan of both feet.  I will follow-up and evaluate tomorrow morning.

## 2022-11-12 NOTE — Progress Notes (Signed)
PROGRESS NOTE    Kyle [name]  DTO:[id number] DOB: [birth year] DOA: 11/11/2022 PCP: [name], MD    Brief Narrative:  62 year old gentleman with history of coronary artery disease, insulin-dependent type 2 diabetes, hypertension, hyperlipidemia, and history of anemia, psoriasis, right foot ulcer who was at orthopedics office yesterday for follow-up and sent to ER due to obvious infection.  Patient does have known right first toe plantar ulceration.  Admitted due to abscess and osteomyelitis for surgical intervention.   Assessment & Plan:   Dry gangrene, ulceration and osteomyelitis of the right foot with superimposed cellulitis: MRI with evidence of osteomyelitis of the toes.  Currently on vancomycin cefepime and Flagyl.  Managed by pharmacy.  Adequate pain medications.  Continue mobility.  Local dressing.  Blood cultures pending. Followed by orthopedic surgery, possibly heading for transmetatarsal amputation. Patient also has developing ulceration on the left toes, hopefully antibiotics will take care of it. Patient does have clinical evidence of adequate vasculature on both feet.  Acute kidney injury: Probably due to #1.  Treated with IV fluids overnight.  Improved.  Type 2 diabetes, uncontrolled with hyperglycemia: Blood sugars elevated.  At home on Levemir 50 units daily, prandial insulin 15 units with meals.  Metformin.  Increase dose of long-acting insulin.  Start prandial insulin.  Sliding scale insulin.  Hold prandial insulin while NPO.  Hypertension: Blood pressure and medications on hold.  Has remained stable here.  Coronary artery disease: Stable on aspirin and statin.  Depression/anxiety: On clonazepam 1 mg as needed.  He is also on multiple other medications that will be resumed. Continue Wellbutrin, BuSpar, paroxetine. Patient also on Suboxone.  Continued.   DVT prophylaxis: heparin injection 5,000 Units Start: 11/11/22 2300   Code Status: Full code Family  Communication: None at the bedside Disposition Plan: Status is: Inpatient Remains inpatient appropriate because: IV antibiotics, inpatient surgical intervention.     Consultants:  Orthopedics, following  Procedures:  None  Antimicrobials:  Vancomycin, cefepime and Flagyl 4/15---   Subjective: Patient seen in the morning rounds.  Denies any complaints.  He was seen by orthopedics.  They were not able to do surgery today but it could be Thursday.  He has minimal pain but bearable and able to walk around.  Objective: Vitals:   11/12/22 0146 11/12/22 0200 11/12/22 0558 11/12/22 0714  BP: 127/78   121/62  Pulse: 91   82  Resp: 18   16  Temp: 98.3 F (36.8 C)   98.4 F (36.9 C)  TempSrc: Oral   Oral  SpO2: 93%   93%  Weight:  99.6 kg    Height:   5\' 6"  (1.676 m)     Intake/Output Summary (Last 24 hours) at 11/12/2022 1120 Last data filed at 11/12/2022 1030 Gross per 24 hour  Intake 120 ml  Output 220 ml  Net -100 ml   Filed Weights   11/12/22 0200  Weight: 99.6 kg    Examination:  General exam: Appears calm and comfortable  Respiratory system: Clear to auscultation. Respiratory effort normal. Cardiovascular system: No added sounds. Gastrointestinal system: Soft.  Nontender. Skin:  Right foot with multiple toe ulcerations, discoloration.  No frank drainage.  Surrounding cellulitis present.  Picture in the chart.  Left foot with erythema of the second and third toe without any abscess or drainage.  Data Reviewed: I have personally reviewed following labs and imaging studies  CBC: Recent Labs  Lab 11/11/22 1800 11/12/22 0253  WBC 13.1* 11.1*  NEUTROABS 9.6*  --  HGB 11.0* 9.2*  HCT 35.5* 29.3*  MCV 88.5 86.4  PLT 361 294   Basic Metabolic Panel: Recent Labs  Lab 11/11/22 1800 11/12/22 0253  NA 133* 131*  K 4.2 3.8  CL 95* 97*  CO2 25 24  GLUCOSE 275* 187*  BUN 17 18  CREATININE 1.60* 1.22  CALCIUM 9.4 8.5*   GFR: Estimated Creatinine  Clearance: 71.1 mL/min (by C-G formula based on SCr of 1.22 mg/dL). Liver Function Tests: Recent Labs  Lab 11/11/22 1800  AST 21  ALT 13  ALKPHOS 113  BILITOT 0.5  PROT 8.9*  ALBUMIN 3.4*   No results for input(s): "LIPASE", "AMYLASE" in the last 168 hours. No results for input(s): "AMMONIA" in the last 168 hours. Coagulation Profile: Recent Labs  Lab 11/11/22 1800  INR 1.2   Cardiac Enzymes: No results for input(s): "CKTOTAL", "CKMB", "CKMBINDEX", "TROPONINI" in the last 168 hours. BNP (last 3 results) No results for input(s): "PROBNP" in the last 8760 hours. HbA1C: Recent Labs    11/12/22 0253  HGBA1C 7.0*   CBG: Recent Labs  Lab 11/12/22 0031 11/12/22 0146 11/12/22 0800 11/12/22 1113  GLUCAP 153* 163* 175* 237*   Lipid Profile: No results for input(s): "CHOL", "HDL", "LDLCALC", "TRIG", "CHOLHDL", "LDLDIRECT" in the last 72 hours. Thyroid Function Tests: No results for input(s): "TSH", "T4TOTAL", "FREET4", "T3FREE", "THYROIDAB" in the last 72 hours. Anemia Panel: No results for input(s): "VITAMINB12", "FOLATE", "FERRITIN", "TIBC", "IRON", "RETICCTPCT" in the last 72 hours. Sepsis Labs: Recent Labs  Lab 11/11/22 1800 11/12/22 0030  LATICACIDVEN 3.9* 1.9    Recent Results (from the past 240 hour(s))  Culture, blood (Routine x 2)     Status: None (Preliminary result)   Collection Time: 11/11/22  6:00 PM   Specimen: BLOOD LEFT ARM  Result Value Ref Range Status   Specimen Description BLOOD LEFT ARM  Final   Special Requests   Final    BOTTLES DRAWN AEROBIC AND ANAEROBIC Blood Culture adequate volume   Culture   Final    NO GROWTH < 12 HOURS Performed at [facility], [address] [place], [place] [address]    Report Status PENDING  Incomplete  Culture, blood (Routine x 2)     Status: None (Preliminary result)   Collection Time: 11/11/22  9:40 PM   Specimen: BLOOD  Result Value Ref Range Status   Specimen Description BLOOD LEFT ANTECUBITAL   Final   Special Requests   Final    BOTTLES DRAWN AEROBIC AND ANAEROBIC Blood Culture adequate volume   Culture   Final    NO GROWTH < 12 HOURS Performed at [facility], [address], [place], [place] [address]    Report Status PENDING  Incomplete  Resp panel by RT-PCR (RSV, Flu A&B, Covid) Anterior Nasal Swab     Status: None   Collection Time: 11/11/22  9:46 PM   Specimen: Anterior Nasal Swab  Result Value Ref Range Status   SARS Coronavirus 2 by RT PCR NEGATIVE NEGATIVE Final   Influenza A by PCR NEGATIVE NEGATIVE Final   Influenza B by PCR NEGATIVE NEGATIVE Final    Comment: (NOTE) The Xpert Xpress SARS-CoV-2/FLU/RSV plus assay is intended as an aid in the diagnosis of influenza from Nasopharyngeal swab specimens and should not be used as a sole basis for treatment. Nasal washings and aspirates are unacceptable for Xpert Xpress SARS-CoV-2/FLU/RSV testing.  Fact Sheet for Patients: BloggerCourse.com  Fact Sheet for Healthcare Providers: SeriousBroker.it  This test is not yet approved or cleared by the [place] and has been authorized for detection and/or diagnosis of SARS-CoV-2 by FDA under an Emergency Use Authorization (EUA). This EUA will remain in effect (meaning this test can be used) for the duration of the COVID-19 declaration under Section 564(b)(1) of the Act, 21 U.S.C. section 360bbb-3(b)(1), unless the authorization is terminated or revoked.     Resp Syncytial Virus by PCR NEGATIVE NEGATIVE Final    Comment: (NOTE) Fact Sheet for Patients: BloggerCourse.com  Fact Sheet for Healthcare Providers: SeriousBroker.it  This test is not yet approved or cleared by the [place] FDA and has been authorized for detection and/or diagnosis of SARS-CoV-2 by FDA under an Emergency Use Authorization (EUA). This EUA will remain in effect (meaning this  test can be used) for the duration of the COVID-19 declaration under Section 564(b)(1) of the Act, 21 U.S.C. section 360bbb-3(b)(1), unless the authorization is terminated or revoked.  Performed at [facility], [address], [place], [place] [address]          Radiology Studies: MR FOOT RIGHT WO CONTRAST  Result Date: 11/12/2022 CLINICAL DATA:  Foot pain and swelling.  Diabetic. EXAM: MRI OF THE RIGHT FOREFOOT WITHOUT CONTRAST TECHNIQUE: Multiplanar, multisequence MR imaging of the right foot was performed. No intravenous contrast was administered. COMPARISON:  Radiographs 11/11/2022 FINDINGS: Diffuse subcutaneous soft tissue swelling/edema consistent with cellulitis. No discrete fluid collection to suggest a drainable abscess. No suspect small open wound involving the plantar aspect of the great toe. Multiple sites of osteomyelitis. There are changes of septic arthritis involving the interphalangeal joint of the great toe with dorsal subluxation of the distal phalanx and pathologic fracture of the proximal phalanx. Diffuse osteomyelitis involving the proximal and distal phalanges. No definite involvement of the first MTP joint but patchy marrow edema in the midportion of the first metatarsal suspicious for another site of osteomyelitis. Also, the FHL tendon is ruptured. Osteomyelitis involving the proximal, middle and distal phalanges of the second toe. Osteomyelitis involving the middle and distal phalanges of the fourth toe. Advanced midfoot neuropathic changes with significant joint space narrowing, degenerative changes and marrow edema. Diffuse myofasciitis without definite MR findings for pyomyositis. IMPRESSION: 1. Diffuse cellulitis and diffuse myofasciitis without definite MR findings for pyomyositis or subcutaneous abscess. 2. Multiple sites of osteomyelitis involving the first, second and fourth toes as detailed above. 3. Septic arthritis involving the interphalangeal joint of  the great toe with dorsal subluxation of the distal phalanx and pathologic fracture of the proximal phalanx. 4. Ruptured FHL. 5. Advanced midfoot neuropathic changes. Electronically Signed   By: Rudie Meyer M.D.   On: 11/12/2022 06:57   DG Chest Port 1 View  Result Date: 11/11/2022 CLINICAL DATA:  Questionable sepsis - evaluate for abnormality EXAM: PORTABLE CHEST 1 VIEW COMPARISON:  Chest x-ray 08/28/2017 FINDINGS: The heart and mediastinal contours are within normal limits. No focal consolidation. No pulmonary edema. No pleural effusion. No pneumothorax. No acute osseous abnormality. IMPRESSION: No active disease. Electronically Signed   By: Tish Frederickson M.D.   On: 11/11/2022 22:05   DG Foot Complete Right  Result Date: 11/11/2022 CLINICAL DATA:  Infection. EXAM: RIGHT FOOT COMPLETE - 3 VIEW COMPARISON:  X-ray 09/13/2022 FINDINGS: Soft tissue swelling about the midfoot and great toe. Well corticated plantar and Achilles calcaneal spurs. Mild degenerative changes of the midfoot. No dislocation. However there is a erosive changes involving the base of the distal phalanx of the  great toe in the distal aspect of the proximal phalanx medially which is new from previous. There is also a possible fracture of the distal margin of the proximal phalanx of the great toe. Please correlate for evidence of an open wound. IMPRESSION: Developing erosive changes involving the bone surrounding the interphalangeal joint of the great toe with possible fracture of the distal aspect of the proximal phalanx. Soft tissue swelling. Please correlate for any clinical evidence of infection in addition to trauma. Electronically Signed   By: Karen Kays M.D.   On: 11/11/2022 18:35        Scheduled Meds:  aspirin  325 mg Oral Daily   buprenorphine-naloxone  1 tablet Sublingual Daily   buPROPion  100 mg Oral Daily   busPIRone  10 mg Oral BID   Centrum Silver 50+Men  1 tablet Oral Daily   [START ON 11/13/2022] ferrous  sulfate  325 mg Oral Q breakfast   gabapentin  300 mg Oral QID   heparin  5,000 Units Subcutaneous Q8H   insulin aspart  0-15 Units Subcutaneous TID WC   insulin aspart  0-5 Units Subcutaneous QHS   insulin aspart  10 Units Subcutaneous TID WC   [START ON 11/13/2022] insulin glargine-yfgn  50 Units Subcutaneous Daily   metoprolol succinate  25 mg Oral Daily   pantoprazole  40 mg Oral Daily   PARoxetine  40 mg Oral Daily   rosuvastatin  20 mg Oral QHS   tamsulosin  0.8 mg Oral Daily   Continuous Infusions:  ceFEPime (MAXIPIME) IV 2 g (11/12/22 1026)   lactated ringers 150 mL/hr at 11/11/22 2311   vancomycin       LOS: 1 day    Time spent: 40 minutes    Dorcas Carrow, MD [facility] Pager [phone]

## 2022-11-12 NOTE — Inpatient Diabetes Management (Signed)
Inpatient Diabetes Program Recommendations  AACE/ADA: New Consensus Statement on Inpatient Glycemic Control (2015)  Target Ranges:  Prepandial:   less than 140 mg/dL      Peak postprandial:   less than 180 mg/dL (1-2 hours)      Critically ill patients:  140 - 180 mg/dL   Lab Results  Component Value Date   GLUCAP 175 (H) 11/12/2022   HGBA1C 7.0 (H) 11/12/2022    Review of Glycemic Control  Diabetes history: DM2 Outpatient Diabetes medications: Lantus 50 QD, Humalog 15 units TID, glipizide 10 mg BID, metformin 1000 mg BID Current orders for Inpatient glycemic control: Semglee 40 QD, Novolog 0-20 TID with meals and 0-5 HS  HgbA1C - 7.0%  Inpatient Diabetes Program Recommendations:    Consider increasing Semglee 45 units QD  Consider adding Novolog 5 units TID  Needs tight glycemic control for healing.  Continue to follow.  Thank you. Ailene Ards, RD, LDN, CDCES Inpatient Diabetes Coordinator [phone]

## 2022-11-12 NOTE — TOC Initial Note (Signed)
Transition of Care [facility]) - Initial/Assessment Note    Patient Details  Name: Kyle [name] MRN: [id number] Date of Birth: 1960-12-15  [facility]) CM/SW Contact:    Lawerance Sabal, RN Phone Number: 11/12/2022, 4:53 PM  Clinical Narrative:                  Patient admitted form home for Bilateral foot ulcers.  IV Abx, pain control. Pending assessment by Dr Lajoyce Corners for possible amputation.   Will follow for PT recs after surgery    Expected Discharge Plan: Home w Home Health Services Barriers to Discharge: Continued Medical Work up   Patient Goals and CMS Choice            Expected Discharge Plan and Services                                              Prior Living Arrangements/Services                       Activities of Daily Living Home Assistive Devices/Equipment: CBG Meter, Eyeglasses ADL Screening (condition at time of admission) Patient's cognitive ability adequate to safely complete daily activities?: Yes Is the patient deaf or have difficulty hearing?: No Does the patient have difficulty seeing, even when wearing glasses/contacts?: No Does the patient have difficulty concentrating, remembering, or making decisions?: No Patient able to express need for assistance with ADLs?: Yes Does the patient have difficulty dressing or bathing?: No Independently performs ADLs?: Yes (appropriate for developmental age) Does the patient have difficulty walking or climbing stairs?: Yes Weakness of Legs: Right Weakness of Arms/Hands: None  Permission Sought/Granted                  Emotional Assessment              Admission diagnosis:  Severe sepsis [A41.9, R65.20] Gangrene of toe of right foot [I96] Patient Active Problem List   Diagnosis Date Noted   Gangrene of toe of right foot 11/11/2022   AKI (acute kidney injury) 11/11/2022   Type 2 diabetes mellitus with hyperglycemia, with long-term current use of insulin 04/03/2022    Type 2 diabetes mellitus with diabetic polyneuropathy, with long-term current use of insulin 04/03/2022   Diabetes mellitus 04/03/2022   Colonic mass 03/04/2022   Skin ulcer of right great toe 01/07/2022   Primary insomnia 12/19/2021   Noncompliance 05/30/2021   Back pain of lumbar region with sciatica 01/31/2021   Epididymoorchitis 12/31/2020   Anemia of unknown etiology    Arthritis    Coronary artery disease    Degenerative disc disease, lumbar    Depression    Diabetes mellitus without complication    GERD (gastroesophageal reflux disease)    Family history of adverse reaction to anesthesia    Hyperlipemia    Myocardial infarction    Elevated alkaline phosphatase level 11/27/2020   Iron deficiency anemia due to chronic blood loss 11/07/2020   Vitamin B12 deficiency 11/02/2020   Anxiety disorder 11/01/2020   Benign prostatic hyperplasia with urinary frequency 11/01/2020   Coronary artery disease of native artery of native heart with stable angina pectoris 11/01/2020   Mild intermittent asthma without complication 11/01/2020   Moderate episode of recurrent major depressive disorder 11/01/2020   Psoriasis 11/01/2020   Uncomplicated opioid dependence 11/01/2020   Primary osteoarthritis involving  multiple joints 11/01/2020   Degenerative lumbar disc 11/01/2020   Mixed hyperlipidemia 06/28/2020   Malaise and fatigue 06/28/2020   Primary osteoarthritis of left knee 09/06/2016   Pneumonia 08/2016   History of arthroplasty of right knee 10/20/2015   Sepsis, unspecified organism 10/20/2015   Primary osteoarthritis of right knee 09/30/2015   Morbid obesity 08/23/2015   Essential (primary) hypertension 08/23/2015   Degeneration of intervertebral disc of lumbosacral region 08/23/2015   Generalized abdominal pain 08/23/2015   Class 2 severe obesity due to excess calories with serious comorbidity and body mass index (BMI) of 38.0 to 38.9 in adult 08/23/2015   Type 2 diabetes mellitus  without complications 08/23/2015   Pain in lower limb 12/07/2013   Plantar fasciitis of left foot 11/01/2013   Metatarsal deformity 11/01/2013   Diabetes 11/01/2013   Type 2 diabetes mellitus without complication, with long-term current use of insulin 11/01/2013   DEHYDRATION 01/26/2009   DYSTHYMIC DISORDER 01/26/2009   ATHEROSLERO NATV ART EXTREM W/INTERMIT CLAUDICAT 01/26/2009   CLAUDICATION 01/26/2009   DYSPNEA 01/26/2009   CHEST PAIN UNSPECIFIED 01/26/2009   PCP:  [name], MD Pharmacy:   [facility] - [place], [place] - [phone] [place] [address] Phone: [phone] Fax: [phone]  [facility] [address], [place] - [address] [place] [address] Phone: [phone] Fax: [phone]     Social Determinants of Health (SDOH) Social History: SDOH Screenings   Food Insecurity: No Food Insecurity (11/12/2022)  Housing: Low Risk  (11/12/2022)  Transportation Needs: No Transportation Needs (11/12/2022)  Utilities: Not At Risk (11/12/2022)  Tobacco Use: Low Risk  (04/03/2022)   SDOH Interventions: Housing Interventions: Patient Refused   Readmission Risk Interventions     No data to display

## 2022-11-12 NOTE — Progress Notes (Signed)
Pharmacy Antibiotic Note  Kyle [name] is a 62 y.o. male for which pharmacy has been consulted for cefepime and vancomycin dosing for sepsis.  Patient with a history of DM. Patient presenting under recommendation from outpt orthopedist that recommended to go to ED for IV abx for rt foot infection.  SCr improved to 1.22- will adjust and schedule Vanco  Vancomycin 1250 mg IV Q 24 hrs. Goal AUC 400-550. Expected AUC: 476 SCr used: 1.22   Plan: Change Vancomycin 1250 IV q24hr Trend WBC, Fever, Renal function F/u cultures, clinical course, WBC De-escalate when able  Height: 5\' 6"  (167.6 cm) Weight: 99.6 kg (219 lb 9.3 oz) IBW/kg (Calculated) : 63.8  Temp (24hrs), Avg:98.2 F (36.8 C), Min:98 F (36.7 C), Max:98.4 F (36.9 C)  Recent Labs  Lab 11/11/22 1800 11/12/22 0030 11/12/22 0253  WBC 13.1*  --  11.1*  CREATININE 1.60*  --  1.22  LATICACIDVEN 3.9* 1.9  --      Estimated Creatinine Clearance: 71.1 mL/min (by C-G formula based on SCr of 1.22 mg/dL).    Allergies  Allergen Reactions   Penicillins Hives, Swelling and Rash    Has patient had a PCN reaction causing immediate rash, facial/tongue/throat swelling, SOB or lightheadedness with hypotension: No Has patient had a PCN reaction causing severe rash involving mucus membranes or skin necrosis: No Has patient had a PCN reaction that required hospitalization No Has patient had a PCN reaction occurring within the last 10 years: No If all of the above answers are "NO", then may proceed with Cephalosporin use.    Cymbalta [Duloxetine Hcl] Other (See Comments)    "makes me crazy" depression   Microbiology results: Pending  Thank you for allowing pharmacy to be a part of this patient's care.  Jeanella Cara, PharmD, [facility] Clinical Pharmacist Please see AMION for all Pharmacists' Contact Phone Numbers 11/12/2022, 8:17 AM

## 2022-11-12 NOTE — H&P (View-Only) (Signed)
Patient ID: Kyle [name], male   DOB: 04/16/1961, 61 y.o.   MRN: [id number] I stopped by to see the patient and he was in the MRI scanner for MRI scan of both feet.  I will follow-up and evaluate tomorrow morning. 

## 2022-11-12 NOTE — Consult Note (Signed)
Reason for Consult:Foot ulcers Referring Physician: Dorcas Carrow Time called: 1610 Time at bedside: 9604   Kyle [name] is an 62 y.o. male.  HPI: Kyle [name] has been struggling with foot swelling, ulcers, and pain for quite some time. He's ready for amputation of some sort. Dr. Susa Simmonds directed him to come to ED for admission and requested second opinion of Dr. Lajoyce Corners. He denies fevers, chills, sweats, N/V.  Past Medical History:  Diagnosis Date   Anemia of unknown etiology    Anxiety disorder    Arthritis    ATHEROSLERO NATV ART EXTREM W/INTERMIT CLAUDICAT 01/26/2009   Qualifier: Diagnosis of  By: Valinda Party RN, Nancy     Back pain of lumbar region with sciatica 01/31/2021   Benign prostatic hyperplasia with urinary frequency    CHEST PAIN UNSPECIFIED 01/26/2009   Qualifier: Diagnosis of  By: Valinda Party RN, Nancy     Class 2 severe obesity due to excess calories with serious comorbidity and body mass index (BMI) of 38.0 to 38.9 in adult [facility])    CLAUDICATION 01/26/2009   Qualifier: Diagnosis of  By: Valinda Party RN, Harriett Sine     Coronary artery disease    minimal CAD '12; NL stress test 06/10/14 ([facility])   Coronary artery disease of native artery of native heart with stable angina pectoris (HCC)    Degeneration of intervertebral disc of lumbosacral region 08/23/2015   Degenerative disc disease, lumbar    Degenerative lumbar disc 11/01/2020   Formatting of this note might be different from the original. Was recommended back surgery. Holding out.   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   Qualifier: Diagnosis of  By: Valinda Party RN, Nancy     Elevated alkaline phosphatase level 11/27/2020   Epididymoorchitis 12/31/2020   Essential (primary) hypertension 08/23/2015   Family history of adverse  reaction to anesthesia    "it made my son sick"   Generalized abdominal pain 08/23/2015   GERD (gastroesophageal reflux disease)    History of arthroplasty of right knee    Hyperlipemia    Iron deficiency anemia due to chronic blood loss 11/07/2020   Metatarsal deformity 11/01/2013   Mild intermittent asthma without complication    Mixed hyperlipidemia    Moderate episode of recurrent major depressive disorder (HCC)    Morbid obesity (HCC) 08/23/2015   Myocardial infarction (HCC)    2012; hospitalized [facility] for chest pain syndrome 02/2011 and LHC showed trivial CAD   Pain in lower limb 12/07/2013   Plantar fasciitis of left foot 11/01/2013   Pneumonia 08/2016   surgery had to be rescheduled due to pneumonia   Primary localized osteoarthritis of left knee 10/14/2016   Primary osteoarthritis involving multiple joints 11/01/2020   Formatting of this note might be different from the original. both knees replaced.   Primary osteoarthritis of left knee 09/06/2016   Primary osteoarthritis of right knee 09/30/2015   Psoriasis    Sepsis, unspecified organism (HCC) 10/20/2015   Type 2 diabetes mellitus without complication, with long-term current use of insulin (HCC) 11/01/2013   Type 2 diabetes mellitus without complications (HCC) 08/23/2015   Uncomplicated opioid dependence (HCC)    Vitamin B12 deficiency 11/02/2020    Past Surgical History:  Procedure Laterality Date  CARDIAC CATHETERIZATION     03/28/11 LHC: NL LM, LAD; 10% mCX, mRCA. EF 65%. (Dr. Dot Been, [facility])   CHOLECYSTECTOMY     CORONARY ANGIOPLASTY     2012 [facility]    ESOPHAGEAL DILATION     SEVERAL TIMES   Heel Spur Resection with Fasiotomy Left 11/25/2013   @ [facility]   KNEE ARTHROSCOPY Bilateral    SHOULDER ARTHROSCOPY WITH OPEN ROTATOR CUFF REPAIR Right    TOTAL KNEE ARTHROPLASTY Right 10/04/2015   Procedure: RIGHT TOTAL KNEE ARTHROPLASTY;  Surgeon: Gean Birchwood, MD;  Location: [facility] OR;  Service: Orthopedics;  Laterality: Right;   TOTAL KNEE  ARTHROPLASTY Left 10/14/2016   TOTAL KNEE ARTHROPLASTY Left 10/14/2016   Procedure: LEFT TOTAL KNEE ARTHROPLASTY;  Surgeon: Gean Birchwood, MD;  Location: [facility] OR;  Service: Orthopedics;  Laterality: Left;   TUMOR REMOVAL     FATTY TUMOR  RT SHOULDER    Family History  Problem Relation Age of Onset   Cancer Mother    Diabetes Mother    Hyperlipidemia Father    Hypertension Father    Heart failure Father    Heart failure Brother     Social History:  reports that he has never smoked. He has never used smokeless tobacco. He reports that he does not drink alcohol and does not use drugs.  Allergies:  Allergies  Allergen Reactions   Penicillins Hives, Swelling and Rash    Has patient had a PCN reaction causing immediate rash, facial/tongue/throat swelling, SOB or lightheadedness with hypotension: No Has patient had a PCN reaction causing severe rash involving mucus membranes or skin necrosis: No Has patient had a PCN reaction that required hospitalization No Has patient had a PCN reaction occurring within the last 10 years: No If all of the above answers are "NO", then may proceed with Cephalosporin use.    Cymbalta [Duloxetine Hcl] Other (See Comments)    "makes me crazy" depression    Medications: I have reviewed the patient's current medications.  Results for orders placed or performed during the hospital encounter of 11/11/22 (from the past 48 hour(s))  Comprehensive metabolic panel     Status: Abnormal   Collection Time: 11/11/22  6:00 PM  Result Value Ref Range   Sodium 133 (L) 135 - 145 mmol/L   Potassium 4.2 3.5 - 5.1 mmol/L   Chloride 95 (L) 98 - 111 mmol/L   CO2 25 22 - 32 mmol/L   Glucose, Bld 275 (H) 70 - 99 mg/dL    Comment: Glucose reference range applies only to samples taken after fasting for at least 8 hours.   BUN 17 6 - 20 mg/dL   Creatinine, Ser 5.28 (H) 0.61 - 1.24 mg/dL   Calcium 9.4 8.9 - 41.3 mg/dL   Total Protein 8.9 (H) 6.5 - 8.1 g/dL   Albumin 3.4 (L)  3.5 - 5.0 g/dL   AST 21 15 - 41 U/L   ALT 13 0 - 44 U/L   Alkaline Phosphatase 113 38 - 126 U/L   Total Bilirubin 0.5 0.3 - 1.2 mg/dL   GFR, Estimated 49 (L) >60 mL/min    Comment: (NOTE) Calculated using the CKD-EPI Creatinine Equation (2021)    Anion gap 13 5 - 15    Comment: Performed at [facility], [address] [place], [place] [address]  Lactic acid, plasma     Status: Abnormal   Collection Time: 11/11/22  6:00 PM  Result Value Ref Range   Lactic  Acid, Venous 3.9 (HH) 0.5 - 1.9 mmol/L    Comment: CRITICAL RESULT CALLED TO, READ BACK BY AND VERIFIED WITH A [place] RN 11/11/2022 1919 BNUNNERY Performed at [facility], [address], [place], [place] [address]   Protime-INR     Status: Abnormal   Collection Time: 11/11/22  6:00 PM  Result Value Ref Range   Prothrombin Time 15.4 (H) 11.4 - 15.2 seconds   INR 1.2 0.8 - 1.2    Comment: (NOTE) INR goal varies based on device and disease states. Performed at [facility], [address], [place], [place] [address]   Culture, blood (Routine x 2)     Status: None (Preliminary result)   Collection Time: 11/11/22  6:00 PM   Specimen: BLOOD LEFT ARM  Result Value Ref Range   Specimen Description BLOOD LEFT ARM    Special Requests      BOTTLES DRAWN AEROBIC AND ANAEROBIC Blood Culture adequate volume   Culture      NO GROWTH < 12 HOURS Performed at [facility], [address], [place], [place] [address]    Report Status PENDING   C-reactive protein     Status: Abnormal   Collection Time: 11/11/22  6:00 PM  Result Value Ref Range   CRP 18.4 (H) <1.0 mg/dL    Comment: Performed at [facility], [address], [place], [place] [address]  CBC with Differential/Platelet     Status: Abnormal   Collection Time: 11/11/22  6:00 PM  Result Value Ref Range   WBC 13.1 (H) 4.0 - 10.5 K/uL   RBC 4.01 (L) 4.22 - 5.81 MIL/uL   Hemoglobin 11.0 (L) 13.0 - 17.0 g/dL   HCT 55.9 (L) 74.1 - 63.8 %   MCV  88.5 80.0 - 100.0 fL   MCH 27.4 26.0 - 34.0 pg   MCHC 31.0 30.0 - 36.0 g/dL   RDW 45.3 64.6 - 80.3 %   Platelets 361 150 - 400 K/uL   nRBC 0.0 0.0 - 0.2 %   Neutrophils Relative % 71 %   Neutro Abs 9.6 (H) 1.7 - 7.7 K/uL   Lymphocytes Relative 15 %   Lymphs Abs 1.9 0.7 - 4.0 K/uL   Monocytes Relative 8 %   Monocytes Absolute 1.0 0.1 - 1.0 K/uL   Eosinophils Relative 4 %   Eosinophils Absolute 0.5 0.0 - 0.5 K/uL   Basophils Relative 1 %   Basophils Absolute 0.1 0.0 - 0.1 K/uL   Immature Granulocytes 1 %   Abs Immature Granulocytes 0.08 (H) 0.00 - 0.07 K/uL    Comment: Performed at [facility], [address], [place], [place] [address]  Sedimentation rate     Status: Abnormal   Collection Time: 11/11/22  6:00 PM  Result Value Ref Range   Sed Rate 79 (H) 0 - 16 mm/hr    Comment: Performed at [facility], [address], [place], [place] [address]  Culture, blood (Routine x 2)     Status: None (Preliminary result)   Collection Time: 11/11/22  9:40 PM   Specimen: BLOOD  Result Value Ref Range   Specimen Description BLOOD LEFT ANTECUBITAL    Special Requests      BOTTLES DRAWN AEROBIC AND ANAEROBIC Blood Culture adequate volume   Culture      NO GROWTH < 12 HOURS Performed at [facility], [address], [place], [place] [address]  Report Status PENDING   Resp panel by RT-PCR (RSV, Flu A&B, Covid) Anterior Nasal Swab     Status: None   Collection Time: 11/11/22  9:46 PM   Specimen: Anterior Nasal Swab  Result Value Ref Range   SARS Coronavirus 2 by RT PCR NEGATIVE NEGATIVE   Influenza A by PCR NEGATIVE NEGATIVE   Influenza B by PCR NEGATIVE NEGATIVE    Comment: (NOTE) The Xpert Xpress SARS-CoV-2/FLU/RSV plus assay is intended as an aid in the diagnosis of influenza from Nasopharyngeal swab specimens and should not be used as a sole basis for treatment. Nasal washings and aspirates are unacceptable for Xpert Xpress SARS-CoV-2/FLU/RSV testing.  Fact  Sheet for Patients: BloggerCourse.com  Fact Sheet for Healthcare Providers: SeriousBroker.it  This test is not yet approved or cleared by the [place] FDA and has been authorized for detection and/or diagnosis of SARS-CoV-2 by FDA under an Emergency Use Authorization (EUA). This EUA will remain in effect (meaning this test can be used) for the duration of the COVID-19 declaration under Section 564(b)(1) of the Act, 21 U.S.C. section 360bbb-3(b)(1), unless the authorization is terminated or revoked.     Resp Syncytial Virus by PCR NEGATIVE NEGATIVE    Comment: (NOTE) Fact Sheet for Patients: BloggerCourse.com  Fact Sheet for Healthcare Providers: SeriousBroker.it  This test is not yet approved or cleared by the [place] FDA and has been authorized for detection and/or diagnosis of SARS-CoV-2 by FDA under an Emergency Use Authorization (EUA). This EUA will remain in effect (meaning this test can be used) for the duration of the COVID-19 declaration under Section 564(b)(1) of the Act, 21 U.S.C. section 360bbb-3(b)(1), unless the authorization is terminated or revoked.  Performed at [facility], [address], [place], [place] [address]   Lactic acid, plasma     Status: None   Collection Time: 11/12/22 12:30 AM  Result Value Ref Range   Lactic Acid, Venous 1.9 0.5 - 1.9 mmol/L    Comment: Performed at [facility], [address], [place], [place] [address]  CBG monitoring, ED     Status: Abnormal   Collection Time: 11/12/22 12:31 AM  Result Value Ref Range   Glucose-Capillary 153 (H) 70 - 99 mg/dL    Comment: Glucose reference range applies only to samples taken after fasting for at least 8 hours.  Glucose, capillary     Status: Abnormal   Collection Time: 11/12/22  1:46 AM  Result Value Ref Range   Glucose-Capillary 163 (H) 70 - 99 mg/dL    Comment:  Glucose reference range applies only to samples taken after fasting for at least 8 hours.  APTT     Status: Abnormal   Collection Time: 11/12/22  2:53 AM  Result Value Ref Range   aPTT 37 (H) 24 - 36 seconds    Comment:        IF BASELINE aPTT IS ELEVATED, SUGGEST PATIENT RISK ASSESSMENT BE USED TO DETERMINE APPROPRIATE ANTICOAGULANT THERAPY. Performed at [facility], [address], [place], [place] [address]   CBC     Status: Abnormal   Collection Time: 11/12/22  2:53 AM  Result Value Ref Range   WBC 11.1 (H) 4.0 - 10.5 K/uL   RBC 3.39 (L) 4.22 - 5.81 MIL/uL   Hemoglobin 9.2 (L) 13.0 - 17.0 g/dL   HCT 82.9 (L) 56.2 - 13.0 %   MCV 86.4 80.0 - 100.0 fL   MCH 27.1 26.0 - 34.0  pg   MCHC 31.4 30.0 - 36.0 g/dL   RDW 40.9 81.1 - 91.4 %   Platelets 294 150 - 400 K/uL   nRBC 0.0 0.0 - 0.2 %    Comment: Performed at [facility], [address], [place], [place] [address]  Basic metabolic panel     Status: Abnormal   Collection Time: 11/12/22  2:53 AM  Result Value Ref Range   Sodium 131 (L) 135 - 145 mmol/L   Potassium 3.8 3.5 - 5.1 mmol/L   Chloride 97 (L) 98 - 111 mmol/L   CO2 24 22 - 32 mmol/L   Glucose, Bld 187 (H) 70 - 99 mg/dL    Comment: Glucose reference range applies only to samples taken after fasting for at least 8 hours.   BUN 18 6 - 20 mg/dL   Creatinine, Ser 6.21 0.61 - 1.24 mg/dL   Calcium 8.5 (L) 8.9 - 10.3 mg/dL   GFR, Estimated >30 >86 mL/min    Comment: (NOTE) Calculated using the CKD-EPI Creatinine Equation (2021)    Anion gap 10 5 - 15    Comment: Performed at [facility], [address], [place], [place] [address]  Hemoglobin A1c     Status: Abnormal   Collection Time: 11/12/22  2:53 AM  Result Value Ref Range   Hgb A1c MFr Bld 7.0 (H) 4.8 - 5.6 %    Comment: (NOTE) Pre diabetes:          5.7%-6.4%  Diabetes:              >6.4%  Glycemic control for   <7.0% adults with diabetes    Mean Plasma Glucose 154.2 mg/dL    Comment:  Performed at [facility], [address] [place], [place] [address]  Glucose, capillary     Status: Abnormal   Collection Time: 11/12/22  8:00 AM  Result Value Ref Range   Glucose-Capillary 175 (H) 70 - 99 mg/dL    Comment: Glucose reference range applies only to samples taken after fasting for at least 8 hours.    MR FOOT RIGHT WO CONTRAST  Result Date: 11/12/2022 CLINICAL DATA:  Foot pain and swelling.  Diabetic. EXAM: MRI OF THE RIGHT FOREFOOT WITHOUT CONTRAST TECHNIQUE: Multiplanar, multisequence MR imaging of the right foot was performed. No intravenous contrast was administered. COMPARISON:  Radiographs 11/11/2022 FINDINGS: Diffuse subcutaneous soft tissue swelling/edema consistent with cellulitis. No discrete fluid collection to suggest a drainable abscess. No suspect small open wound involving the plantar aspect of the great toe. Multiple sites of osteomyelitis. There are changes of septic arthritis involving the interphalangeal joint of the great toe with dorsal subluxation of the distal phalanx and pathologic fracture of the proximal phalanx. Diffuse osteomyelitis involving the proximal and distal phalanges. No definite involvement of the first MTP joint but patchy marrow edema in the midportion of the first metatarsal suspicious for another site of osteomyelitis. Also, the FHL tendon is ruptured. Osteomyelitis involving the proximal, middle and distal phalanges of the second toe. Osteomyelitis involving the middle and distal phalanges of the fourth toe. Advanced midfoot neuropathic changes with significant joint space narrowing, degenerative changes and marrow edema. Diffuse myofasciitis without definite MR findings for pyomyositis. IMPRESSION: 1. Diffuse cellulitis and diffuse myofasciitis without definite MR findings for pyomyositis or subcutaneous abscess. 2. Multiple sites of osteomyelitis involving the first, second and fourth toes as detailed above. 3. Septic arthritis involving  the interphalangeal joint of the great toe with  dorsal subluxation of the distal phalanx and pathologic fracture of the proximal phalanx. 4. Ruptured FHL. 5. Advanced midfoot neuropathic changes. Electronically Signed   By: Rudie Meyer M.D.   On: 11/12/2022 06:57   DG Chest Port 1 View  Result Date: 11/11/2022 CLINICAL DATA:  Questionable sepsis - evaluate for abnormality EXAM: PORTABLE CHEST 1 VIEW COMPARISON:  Chest x-ray 08/28/2017 FINDINGS: The heart and mediastinal contours are within normal limits. No focal consolidation. No pulmonary edema. No pleural effusion. No pneumothorax. No acute osseous abnormality. IMPRESSION: No active disease. Electronically Signed   By: Tish Frederickson M.D.   On: 11/11/2022 22:05   DG Foot Complete Right  Result Date: 11/11/2022 CLINICAL DATA:  Infection. EXAM: RIGHT FOOT COMPLETE - 3 VIEW COMPARISON:  X-ray 09/13/2022 FINDINGS: Soft tissue swelling about the midfoot and great toe. Well corticated plantar and Achilles calcaneal spurs. Mild degenerative changes of the midfoot. No dislocation. However there is a erosive changes involving the base of the distal phalanx of the great toe in the distal aspect of the proximal phalanx medially which is new from previous. There is also a possible fracture of the distal margin of the proximal phalanx of the great toe. Please correlate for evidence of an open wound. IMPRESSION: Developing erosive changes involving the bone surrounding the interphalangeal joint of the great toe with possible fracture of the distal aspect of the proximal phalanx. Soft tissue swelling. Please correlate for any clinical evidence of infection in addition to trauma. Electronically Signed   By: Karen Kays M.D.   On: 11/11/2022 18:35    Review of Systems  Constitutional:  Negative for chills, diaphoresis and fever.  HENT:  Negative for ear discharge, ear pain, hearing loss and tinnitus.   Eyes:  Negative for photophobia and pain.  Respiratory:   Negative for cough and shortness of breath.   Cardiovascular:  Negative for chest pain.  Gastrointestinal:  Negative for abdominal pain, nausea and vomiting.  Genitourinary:  Negative for dysuria, flank pain, frequency and urgency.  Musculoskeletal:  Positive for arthralgias (Bilateral feet, R>L). Negative for back pain, myalgias and neck pain.  Neurological:  Negative for dizziness and headaches.  Hematological:  Does not bruise/bleed easily.  Psychiatric/Behavioral:  The patient is not nervous/anxious.    Blood pressure 121/62, pulse 82, temperature 98.4 F (36.9 C), temperature source Oral, resp. rate 16, height  (1.676 m), weight 99.6 kg, SpO2 93 %. Physical Exam Constitutional:      General: He is not in acute distress.    Appearance: He is well-developed. He is not diaphoretic.  HENT:     Head: Normocephalic and atraumatic.  Eyes:     General: No scleral icterus.       Right eye: No discharge.        Left eye: No discharge.     Conjunctiva/sclera: Conjunctivae normal.  Cardiovascular:     Rate and Rhythm: Normal rate and regular rhythm.  Pulmonary:     Effort: Pulmonary effort is normal. No respiratory distress.  Musculoskeletal:     Cervical back: Normal range of motion.  Feet:     Comments: Right foot -- Erythema, ulcerations, and edema forefoot. Some necrotic areas distal 2,4 toes. 2+ DP/PT. SPN/DPN/TN intact.  Left foot -- Erythema, edema, ulceration 2nd toe, some erythema 5th toe. 2+ DP/PT. SPN/DPN/TN intact. Skin:    General: Skin is warm and dry.  Neurological:     Mental Status: He is alert.  Psychiatric:  Mood and Affect: Mood normal.        Behavior: Behavior normal.     Assessment/Plan: Bilateral foot ulcers -- Will get MRI left foot to evaluate 2nd toe/MT. Dr. Lajoyce Corners to evaluate later today or in AM.    Freeman Caldron, PA-C Orthopedic Surgery [phone] 11/12/2022, 9:37 AM

## 2022-11-12 NOTE — ED Notes (Signed)
..ED TO INPATIENT HANDOFF REPORT  ED Nurse Name and Phone #: [phone]  S Name/Age/Gender Kyle [name] 62 y.o. male Room/Bed: 019C/019C  Code Status   Code Status: Full Code  Home/SNF/Other Home Patient oriented to: self, place, time, and situation Is this baseline? Yes   Triage Complete: Triage complete  Chief Complaint Gangrene of toe of right foot [I96]  Triage Note Patient with history of diabetes here for evaluation of infection in right foot. Patient states he saw an orthopedist earlier today who told him go to the emergency department for IV antibiotics and possible hospital admission. Patient is alert, oriented, and in no apparent distress at this time.   Allergies Allergies  Allergen Reactions   Duloxetine     Other reaction(s): Mental Status Changes (intolerance)   Penicillins Hives, Swelling and Rash    Has patient had a PCN reaction causing immediate rash, facial/tongue/throat swelling, SOB or lightheadedness with hypotension: No Has patient had a PCN reaction causing severe rash involving mucus membranes or skin necrosis: No Has patient had a PCN reaction that required hospitalization No Has patient had a PCN reaction occurring within the last 10 years: No If all of the above answers are "NO", then may proceed with Cephalosporin use.    Cymbalta [Duloxetine Hcl] Other (See Comments)    "makes me crazy" depression    Level of Care/Admitting Diagnosis ED Disposition     ED Disposition  Admit   Condition  --   Comment  Hospital Area: [facility] [[id number]]  Level of Care: Med-Surg [16]  May admit patient to [facility] or [facility] if equivalent level of care is available:: No  Covid Evaluation: Asymptomatic - no recent exposure (last 10 days) testing not required  Diagnosis: Gangrene of toe of right foot [[id number]]  Admitting Physician: Charlsie Quest [[id number]]  Attending Physician: Charlsie Quest [[id number]]  Certification:: I certify  this patient will need inpatient services for at least 2 midnights  Estimated Length of Stay: 2          B Medical/Surgery History Past Medical History:  Diagnosis Date   Anemia of unknown etiology    Anxiety disorder    Arthritis    ATHEROSLERO NATV ART EXTREM W/INTERMIT CLAUDICAT 01/26/2009   Qualifier: Diagnosis of  By: Valinda Party RN, Nancy     Back pain of lumbar region with sciatica 01/31/2021   Benign prostatic hyperplasia with urinary frequency    CHEST PAIN UNSPECIFIED 01/26/2009   Qualifier: Diagnosis of  By: Valinda Party RN, Harriett Sine     Class 2 severe obesity due to excess calories with serious comorbidity and body mass index (BMI) of 38.0 to 38.9 in adult [facility])    CLAUDICATION 01/26/2009   Qualifier: Diagnosis of  By: Valinda Party RN, Harriett Sine     Coronary artery disease    minimal CAD '12; NL stress test 06/10/14 ([facility])   Coronary artery disease of native artery of native heart with stable angina pectoris (HCC)    Degeneration of intervertebral disc of lumbosacral region 08/23/2015   Degenerative disc disease, lumbar    Degenerative lumbar disc 11/01/2020   Formatting of this note might be different from the original. Was recommended back surgery. Holding out.   Dehydration 01/26/2009   Qualifier: Diagnosis of  By: Valinda Party RN, Nancy     Depression    Diabetes [facility]) 11/01/2013   Diabetes mellitus without complication (HCC)    DYSPNEA 01/26/2009   Qualifier: Diagnosis of  By: Valinda Party, RN,  Nancy     Dysthymic disorder 01/26/2009   Qualifier: Diagnosis of  By: Valinda Party RN, Nancy     Elevated alkaline phosphatase level 11/27/2020   Epididymoorchitis 12/31/2020   Essential (primary) hypertension 08/23/2015   Family history of adverse reaction to anesthesia    "it made my son sick"   Generalized abdominal pain 08/23/2015   GERD (gastroesophageal reflux disease)    History of arthroplasty of right knee    Hyperlipemia    Iron deficiency anemia due to chronic blood loss 11/07/2020   Metatarsal deformity  11/01/2013   Mild intermittent asthma without complication    Mixed hyperlipidemia    Moderate episode of recurrent major depressive disorder (HCC)    Morbid obesity (HCC) 08/23/2015   Myocardial infarction (HCC)    2012; hospitalized [facility] for chest pain syndrome 02/2011 and LHC showed trivial CAD   Pain in lower limb 12/07/2013   Plantar fasciitis of left foot 11/01/2013   Pneumonia 08/2016   surgery had to be rescheduled due to pneumonia   Primary localized osteoarthritis of left knee 10/14/2016   Primary osteoarthritis involving multiple joints 11/01/2020   Formatting of this note might be different from the original. both knees replaced.   Primary osteoarthritis of left knee 09/06/2016   Primary osteoarthritis of right knee 09/30/2015   Psoriasis    Sepsis, unspecified organism (HCC) 10/20/2015   Type 2 diabetes mellitus without complication, with long-term current use of insulin (HCC) 11/01/2013   Type 2 diabetes mellitus without complications (HCC) 08/23/2015   Uncomplicated opioid dependence (HCC)    Vitamin B12 deficiency 11/02/2020   Past Surgical History:  Procedure Laterality Date   CARDIAC CATHETERIZATION     03/28/11 LHC: NL LM, LAD; 10% mCX, mRCA. EF 65%. (Dr. Dot Been, [facility])   CHOLECYSTECTOMY     CORONARY ANGIOPLASTY     2012 [facility]    ESOPHAGEAL DILATION     SEVERAL TIMES   Heel Spur Resection with Fasiotomy Left 11/25/2013   @ [facility]   KNEE ARTHROSCOPY Bilateral    SHOULDER ARTHROSCOPY WITH OPEN ROTATOR CUFF REPAIR Right    TOTAL KNEE ARTHROPLASTY Right 10/04/2015   Procedure: RIGHT TOTAL KNEE ARTHROPLASTY;  Surgeon: Gean Birchwood, MD;  Location: [facility] OR;  Service: Orthopedics;  Laterality: Right;   TOTAL KNEE ARTHROPLASTY Left 10/14/2016   TOTAL KNEE ARTHROPLASTY Left 10/14/2016   Procedure: LEFT TOTAL KNEE ARTHROPLASTY;  Surgeon: Gean Birchwood, MD;  Location: [facility] OR;  Service: Orthopedics;  Laterality: Left;   TUMOR REMOVAL     FATTY TUMOR  RT SHOULDER     A IV  Location/Drains/Wounds Patient Lines/Drains/Airways Status     Active Line/Drains/Airways     Name Placement date Placement time Site Days   Peripheral IV 11/11/22 20 G 1.88" Left;Anterior Forearm 11/11/22  2236  Forearm  1   Incision (Closed) 10/04/15 Leg Right 10/04/15  1504  -- 2596   Incision (Closed) 10/14/16 Knee Left 10/14/16  1212  -- 2220            Intake/Output Last 24 hours No intake or output data in the 24 hours ending 11/12/22 0017  Labs/Imaging Results for orders placed or performed during the hospital encounter of 11/11/22 (from the past 48 hour(s))  Comprehensive metabolic panel     Status: Abnormal   Collection Time: 11/11/22  6:00 PM  Result Value Ref Range   Sodium 133 (L) 135 - 145 mmol/L   Potassium 4.2 3.5 - 5.1 mmol/L  Chloride 95 (L) 98 - 111 mmol/L   CO2 25 22 - 32 mmol/L   Glucose, Bld 275 (H) 70 - 99 mg/dL    Comment: Glucose reference range applies only to samples taken after fasting for at least 8 hours.   BUN 17 6 - 20 mg/dL   Creatinine, Ser 1.61 (H) 0.61 - 1.24 mg/dL   Calcium 9.4 8.9 - 09.6 mg/dL   Total Protein 8.9 (H) 6.5 - 8.1 g/dL   Albumin 3.4 (L) 3.5 - 5.0 g/dL   AST 21 15 - 41 U/L   ALT 13 0 - 44 U/L   Alkaline Phosphatase 113 38 - 126 U/L   Total Bilirubin 0.5 0.3 - 1.2 mg/dL   GFR, Estimated 49 (L) >60 mL/min    Comment: (NOTE) Calculated using the CKD-EPI Creatinine Equation (2021)    Anion gap 13 5 - 15    Comment: Performed at [facility], [address] [place], [place] [address]  Lactic acid, plasma     Status: Abnormal   Collection Time: 11/11/22  6:00 PM  Result Value Ref Range   Lactic Acid, Venous 3.9 (HH) 0.5 - 1.9 mmol/L    Comment: CRITICAL RESULT CALLED TO, READ BACK BY AND VERIFIED WITH A [place] RN 11/11/2022 1919 BNUNNERY Performed at [facility], [address], [place], [place] [address]   Protime-INR     Status: Abnormal   Collection Time: 11/11/22  6:00 PM  Result Value Ref Range    Prothrombin Time 15.4 (H) 11.4 - 15.2 seconds   INR 1.2 0.8 - 1.2    Comment: (NOTE) INR goal varies based on device and disease states. Performed at [facility], [address] [place], [place] [address]   C-reactive protein     Status: Abnormal   Collection Time: 11/11/22  6:00 PM  Result Value Ref Range   CRP 18.4 (H) <1.0 mg/dL    Comment: Performed at [facility], [address] [place], [place] [address]  CBC with Differential/Platelet     Status: Abnormal   Collection Time: 11/11/22  6:00 PM  Result Value Ref Range   WBC 13.1 (H) 4.0 - 10.5 K/uL   RBC 4.01 (L) 4.22 - 5.81 MIL/uL   Hemoglobin 11.0 (L) 13.0 - 17.0 g/dL   HCT 30.8 (L) 65.7 - 84.6 %   MCV 88.5 80.0 - 100.0 fL   MCH 27.4 26.0 - 34.0 pg   MCHC 31.0 30.0 - 36.0 g/dL   RDW 96.2 95.2 - 84.1 %   Platelets 361 150 - 400 K/uL   nRBC 0.0 0.0 - 0.2 %   Neutrophils Relative % 71 %   Neutro Abs 9.6 (H) 1.7 - 7.7 K/uL   Lymphocytes Relative 15 %   Lymphs Abs 1.9 0.7 - 4.0 K/uL   Monocytes Relative 8 %   Monocytes Absolute 1.0 0.1 - 1.0 K/uL   Eosinophils Relative 4 %   Eosinophils Absolute 0.5 0.0 - 0.5 K/uL   Basophils Relative 1 %   Basophils Absolute 0.1 0.0 - 0.1 K/uL   Immature Granulocytes 1 %   Abs Immature Granulocytes 0.08 (H) 0.00 - 0.07 K/uL    Comment: Performed at [facility], [address], [place], [place] [address]  Sedimentation rate     Status: Abnormal   Collection Time: 11/11/22  6:00 PM  Result Value Ref Range   Sed Rate 79 (H) 0 - 16 mm/hr  Comment: Performed at [facility], [address] [place], [place] [address]   DG Chest Port 1 View  Result Date: 11/11/2022 CLINICAL DATA:  Questionable sepsis - evaluate for abnormality EXAM: PORTABLE CHEST 1 VIEW COMPARISON:  Chest x-ray 08/28/2017 FINDINGS: The heart and mediastinal contours are within normal limits. No focal consolidation. No pulmonary edema. No pleural effusion. No pneumothorax. No acute osseous  abnormality. IMPRESSION: No active disease. Electronically Signed   By: Tish Frederickson M.D.   On: 11/11/2022 22:05   DG Foot Complete Right  Result Date: 11/11/2022 CLINICAL DATA:  Infection. EXAM: RIGHT FOOT COMPLETE - 3 VIEW COMPARISON:  X-ray 09/13/2022 FINDINGS: Soft tissue swelling about the midfoot and great toe. Well corticated plantar and Achilles calcaneal spurs. Mild degenerative changes of the midfoot. No dislocation. However there is a erosive changes involving the base of the distal phalanx of the great toe in the distal aspect of the proximal phalanx medially which is new from previous. There is also a possible fracture of the distal margin of the proximal phalanx of the great toe. Please correlate for evidence of an open wound. IMPRESSION: Developing erosive changes involving the bone surrounding the interphalangeal joint of the great toe with possible fracture of the distal aspect of the proximal phalanx. Soft tissue swelling. Please correlate for any clinical evidence of infection in addition to trauma. Electronically Signed   By: Karen Kays M.D.   On: 11/11/2022 18:35    Pending Labs Unresulted Labs (From admission, onward)     Start     Ordered   11/12/22 0500  HIV Antibody (routine testing w rflx)  (HIV Antibody (Routine testing w reflex) panel)  Tomorrow morning,   R        11/11/22 2249   11/12/22 0500  CBC  Tomorrow morning,   R        11/11/22 2249   11/12/22 0500  Basic metabolic panel  Tomorrow morning,   R        11/11/22 2249   11/12/22 0500  Hemoglobin A1c  Tomorrow morning,   R       Comments: To assess prior glycemic control    11/11/22 2249   11/11/22 2146  Resp panel by RT-PCR (RSV, Flu A&B, Covid) Anterior Nasal Swab  (Septic presentation on arrival (screening labs, nursing and treatment orders for obvious sepsis))  Once,   URGENT        11/11/22 2147   11/11/22 2146  APTT  (Septic presentation on arrival (screening labs, nursing and treatment orders for  obvious sepsis))  ONCE - STAT,   STAT        11/11/22 2147   11/11/22 1747  Lactic acid, plasma  Now then every 2 hours,   R (with STAT occurrences)      11/11/22 1747   11/11/22 1747  Culture, blood (Routine x 2)  BLOOD CULTURE X 2,   R (with STAT occurrences)      11/11/22 1747            Vitals/Pain Today's Vitals   11/11/22 2200 11/11/22 2313 11/11/22 2341 11/12/22 0000  BP: 104/82   (!) 118/95  Pulse: 82   89  Resp:    15  Temp:   98 F (36.7 C) 98.1 F (36.7 C)  TempSrc:      SpO2: 98%   94%  PainSc:  8       Isolation Precautions No active isolations  Medications Medications  lactated ringers infusion ( Intravenous New Bag/Given 11/11/22 2311)  metroNIDAZOLE (FLAGYL) IVPB 500 mg (has no administration in time range)  vancomycin (VANCOREADY) IVPB 2000 mg/400 mL (2,000 mg Intravenous New Bag/Given 11/11/22 2312)  vancomycin variable dose per unstable renal function (pharmacist dosing) (has no administration in time range)  ceFEPIme (MAXIPIME) 2 g in sodium chloride 0.9 % 100 mL IVPB (has no administration in time range)  heparin injection 5,000 Units (has no administration in time range)  acetaminophen (TYLENOL) tablet 650 mg (has no administration in time range)    Or  acetaminophen (TYLENOL) suppository 650 mg (has no administration in time range)  ondansetron (ZOFRAN) tablet 4 mg (has no administration in time range)    Or  ondansetron (ZOFRAN) injection 4 mg (has no administration in time range)  senna-docusate (Senokot-S) tablet 1 tablet (has no administration in time range)  insulin glargine-yfgn (SEMGLEE) injection 40 Units (has no administration in time range)  insulin aspart (novoLOG) injection 0-5 Units (has no administration in time range)  insulin aspart (novoLOG) injection 0-20 Units (has no administration in time range)  HYDROcodone-acetaminophen (NORCO/VICODIN) 5-325 MG per tablet 1-2 tablet (has no administration in time range)  HYDROmorphone  (DILAUDID) injection 0.5 mg (has no administration in time range)  albuterol (VENTOLIN HFA) 108 (90 Base) MCG/ACT inhaler 2 puff (has no administration in time range)  aspirin tablet 325 mg (has no administration in time range)  rosuvastatin (CRESTOR) tablet 20 mg (has no administration in time range)    Mobility walks     Focused Assessments Cardiac Assessment Handoff:    No results found for: "CKTOTAL", "CKMB", "CKMBINDEX", "TROPONINI" No results found for: "DDIMER" Does the Patient currently have chest pain? No    R Recommendations: See Admitting Provider Note  Report given to:   Additional Notes: pt is alert and oriented x4, able to ambulate, has LR and Vancomycin hanging right now and supposed to have flagyl and cefepime after. Delay in meds was due to IV access. MD aware.

## 2022-11-12 NOTE — Consult Note (Signed)
Reason for Consult: Right foot gangrene  Referring Physician: Darreld Mclean, MD   Kyle [name] is an 62 y.o. male.  HPI: Patient was seen in our office yesterday and found to have gangrenous foot infection bilaterally involving the toes, with the right side being worse than the left.  He was instructed to present to the emergency department due to the extensive clinical nature of the infection.  There, he was placed on IV antibiotics and admitted to the hospitalist team.  MRI scan of the right foot has been obtained.  Reports his pain has improved since admission and with IV antibiotics.  A1C yesterday was 7.  Noted to have elevated white blood cell count and inflammatory markers.  Denies any fever, chills, or night sweats.  He denies feeling systemically ill.  Orthopedics is consulted for further evaluation and treatment  Past Medical History:  Diagnosis Date   Anemia of unknown etiology    Anxiety disorder    Arthritis    ATHEROSLERO NATV ART EXTREM W/INTERMIT CLAUDICAT 01/26/2009   Qualifier: Diagnosis of  By: Valinda Party RN, Nancy     Back pain of lumbar region with sciatica 01/31/2021   Benign prostatic hyperplasia with urinary frequency    CHEST PAIN UNSPECIFIED 01/26/2009   Qualifier: Diagnosis of  By: Valinda Party RN, Nancy     Class 2 severe obesity due to excess calories with serious comorbidity and body mass index (BMI) of 38.0 to 38.9 in adult [facility])    CLAUDICATION 01/26/2009   Qualifier: Diagnosis of  By: Valinda Party RN, Harriett Sine     Coronary artery disease    minimal CAD '12; NL stress test 06/10/14 ([facility])   Coronary artery disease of native artery of native heart with stable angina pectoris (HCC)    Degeneration of intervertebral disc of lumbosacral region 08/23/2015   Degenerative disc disease, lumbar    Degenerative lumbar disc 11/01/2020   Formatting of this note might be different from the original. Was recommended back surgery. Holding out.   Dehydration 01/26/2009   Qualifier: Diagnosis of  By:  Valinda Party RN, Nancy     Depression    Diabetes (HCC) 11/01/2013   Diabetes mellitus without complication (HCC)    DYSPNEA 01/26/2009   Qualifier: Diagnosis of  By: Valinda Party RN, Harriett Sine     Dysthymic disorder 01/26/2009   Qualifier: Diagnosis of  By: Valinda Party RN, Nancy     Elevated alkaline phosphatase level 11/27/2020   Epididymoorchitis 12/31/2020   Essential (primary) hypertension 08/23/2015   Family history of adverse reaction to anesthesia    "it made my son sick"   Generalized abdominal pain 08/23/2015   GERD (gastroesophageal reflux disease)    History of arthroplasty of right knee    Hyperlipemia    Iron deficiency anemia due to chronic blood loss 11/07/2020   Metatarsal deformity 11/01/2013   Mild intermittent asthma without complication    Mixed hyperlipidemia    Moderate episode of recurrent major depressive disorder (HCC)    Morbid obesity (HCC) 08/23/2015   Myocardial infarction (HCC)    2012; hospitalized [facility] for chest pain syndrome 02/2011 and LHC showed trivial CAD   Pain in lower limb 12/07/2013   Plantar fasciitis of left foot 11/01/2013   Pneumonia 08/2016   surgery had to be rescheduled due to pneumonia   Primary localized osteoarthritis of left knee 10/14/2016   Primary osteoarthritis involving multiple joints 11/01/2020   Formatting of this note might be different from the original. both knees replaced.   Primary  osteoarthritis of left knee 09/06/2016   Primary osteoarthritis of right knee 09/30/2015   Psoriasis    Sepsis, unspecified organism (HCC) 10/20/2015   Type 2 diabetes mellitus without complication, with long-term current use of insulin (HCC) 11/01/2013   Type 2 diabetes mellitus without complications (HCC) 08/23/2015   Uncomplicated opioid dependence (HCC)    Vitamin B12 deficiency 11/02/2020    Past Surgical History:  Procedure Laterality Date   CARDIAC CATHETERIZATION     03/28/11 LHC: NL LM, LAD; 10% mCX, mRCA. EF 65%. (Dr. Dot Been, [facility])   CHOLECYSTECTOMY     CORONARY  ANGIOPLASTY     2012 [facility]    ESOPHAGEAL DILATION     SEVERAL TIMES   Heel Spur Resection with Fasiotomy Left 11/25/2013   @ [facility]   KNEE ARTHROSCOPY Bilateral    SHOULDER ARTHROSCOPY WITH OPEN ROTATOR CUFF REPAIR Right    TOTAL KNEE ARTHROPLASTY Right 10/04/2015   Procedure: RIGHT TOTAL KNEE ARTHROPLASTY;  Surgeon: Gean Birchwood, MD;  Location: [facility] OR;  Service: Orthopedics;  Laterality: Right;   TOTAL KNEE ARTHROPLASTY Left 10/14/2016   TOTAL KNEE ARTHROPLASTY Left 10/14/2016   Procedure: LEFT TOTAL KNEE ARTHROPLASTY;  Surgeon: Gean Birchwood, MD;  Location: [facility] OR;  Service: Orthopedics;  Laterality: Left;   TUMOR REMOVAL     FATTY TUMOR  RT SHOULDER    Family History  Problem Relation Age of Onset   Cancer Mother    Diabetes Mother    Hyperlipidemia Father    Hypertension Father    Heart failure Father    Heart failure Brother     Social History:  reports that he has never smoked. He has never used smokeless tobacco. He reports that he does not drink alcohol and does not use drugs.  Allergies:  Allergies  Allergen Reactions   Penicillins Hives, Swelling and Rash    Has patient had a PCN reaction causing immediate rash, facial/tongue/throat swelling, SOB or lightheadedness with hypotension: No Has patient had a PCN reaction causing severe rash involving mucus membranes or skin necrosis: No Has patient had a PCN reaction that required hospitalization No Has patient had a PCN reaction occurring within the last 10 years: No If all of the above answers are "NO", then may proceed with Cephalosporin use.    Cymbalta [Duloxetine Hcl] Other (See Comments)    "makes me crazy" depression    Medications: I have reviewed the patient's current medications.  Results for orders placed or performed during the hospital encounter of 11/11/22 (from the past 48 hour(s))  Comprehensive metabolic panel     Status: Abnormal   Collection Time: 11/11/22  6:00 PM  Result Value Ref Range    Sodium 133 (L) 135 - 145 mmol/L   Potassium 4.2 3.5 - 5.1 mmol/L   Chloride 95 (L) 98 - 111 mmol/L   CO2 25 22 - 32 mmol/L   Glucose, Bld 275 (H) 70 - 99 mg/dL    Comment: Glucose reference range applies only to samples taken after fasting for at least 8 hours.   BUN 17 6 - 20 mg/dL   Creatinine, Ser 1.61 (H) 0.61 - 1.24 mg/dL   Calcium 9.4 8.9 - 09.6 mg/dL   Total Protein 8.9 (H) 6.5 - 8.1 g/dL   Albumin 3.4 (L) 3.5 - 5.0 g/dL   AST 21 15 - 41 U/L   ALT 13 0 - 44 U/L   Alkaline Phosphatase 113 38 - 126 U/L   Total Bilirubin 0.5 0.3 -  1.2 mg/dL   GFR, Estimated 49 (L) >60 mL/min    Comment: (NOTE) Calculated using the CKD-EPI Creatinine Equation (2021)    Anion gap 13 5 - 15    Comment: Performed at [facility], [address], [place], [place] [address]  Lactic acid, plasma     Status: Abnormal   Collection Time: 11/11/22  6:00 PM  Result Value Ref Range   Lactic Acid, Venous 3.9 (HH) 0.5 - 1.9 mmol/L    Comment: CRITICAL RESULT CALLED TO, READ BACK BY AND VERIFIED WITH A [place] RN 11/11/2022 1919 BNUNNERY Performed at [facility], [address], [place], [place] [address]   Protime-INR     Status: Abnormal   Collection Time: 11/11/22  6:00 PM  Result Value Ref Range   Prothrombin Time 15.4 (H) 11.4 - 15.2 seconds   INR 1.2 0.8 - 1.2    Comment: (NOTE) INR goal varies based on device and disease states. Performed at [facility], [address] [place], [place] [address]   Culture, blood (Routine x 2)     Status: None (Preliminary result)   Collection Time: 11/11/22  6:00 PM   Specimen: BLOOD LEFT ARM  Result Value Ref Range   Specimen Description BLOOD LEFT ARM    Special Requests      BOTTLES DRAWN AEROBIC AND ANAEROBIC Blood Culture adequate volume   Culture      NO GROWTH < 12 HOURS Performed at [facility], [address], [place], [place] [address]    Report Status PENDING   C-reactive protein     Status: Abnormal   Collection  Time: 11/11/22  6:00 PM  Result Value Ref Range   CRP 18.4 (H) <1.0 mg/dL    Comment: Performed at [facility], [address] [place], [place] [address]  CBC with Differential/Platelet     Status: Abnormal   Collection Time: 11/11/22  6:00 PM  Result Value Ref Range   WBC 13.1 (H) 4.0 - 10.5 K/uL   RBC 4.01 (L) 4.22 - 5.81 MIL/uL   Hemoglobin 11.0 (L) 13.0 - 17.0 g/dL   HCT 13.0 (L) 86.5 - 78.4 %   MCV 88.5 80.0 - 100.0 fL   MCH 27.4 26.0 - 34.0 pg   MCHC 31.0 30.0 - 36.0 g/dL   RDW 69.6 29.5 - 28.4 %   Platelets 361 150 - 400 K/uL   nRBC 0.0 0.0 - 0.2 %   Neutrophils Relative % 71 %   Neutro Abs 9.6 (H) 1.7 - 7.7 K/uL   Lymphocytes Relative 15 %   Lymphs Abs 1.9 0.7 - 4.0 K/uL   Monocytes Relative 8 %   Monocytes Absolute 1.0 0.1 - 1.0 K/uL   Eosinophils Relative 4 %   Eosinophils Absolute 0.5 0.0 - 0.5 K/uL   Basophils Relative 1 %   Basophils Absolute 0.1 0.0 - 0.1 K/uL   Immature Granulocytes 1 %   Abs Immature Granulocytes 0.08 (H) 0.00 - 0.07 K/uL    Comment: Performed at [facility], [address] [place], [place] [address]  Sedimentation rate     Status: Abnormal   Collection Time: 11/11/22  6:00 PM  Result Value Ref Range   Sed Rate 79 (H) 0 - 16 mm/hr    Comment: Performed at [facility], [address] [place], [place] [address]  Culture, blood (Routine x 2)     Status: None (  Preliminary result)   Collection Time: 11/11/22  9:40 PM   Specimen: BLOOD  Result Value Ref Range   Specimen Description BLOOD LEFT ANTECUBITAL    Special Requests      BOTTLES DRAWN AEROBIC AND ANAEROBIC Blood Culture adequate volume   Culture      NO GROWTH < 12 HOURS Performed at [facility], [address] [place], [place] [address]    Report Status PENDING   Resp panel by RT-PCR (RSV, Flu A&B, Covid) Anterior Nasal Swab     Status: None   Collection Time: 11/11/22  9:46 PM   Specimen: Anterior Nasal Swab  Result Value Ref Range   SARS  Coronavirus 2 by RT PCR NEGATIVE NEGATIVE   Influenza A by PCR NEGATIVE NEGATIVE   Influenza B by PCR NEGATIVE NEGATIVE    Comment: (NOTE) The Xpert Xpress SARS-CoV-2/FLU/RSV plus assay is intended as an aid in the diagnosis of influenza from Nasopharyngeal swab specimens and should not be used as a sole basis for treatment. Nasal washings and aspirates are unacceptable for Xpert Xpress SARS-CoV-2/FLU/RSV testing.  Fact Sheet for Patients: BloggerCourse.com  Fact Sheet for Healthcare Providers: SeriousBroker.it  This test is not yet approved or cleared by the [place] FDA and has been authorized for detection and/or diagnosis of SARS-CoV-2 by FDA under an Emergency Use Authorization (EUA). This EUA will remain in effect (meaning this test can be used) for the duration of the COVID-19 declaration under Section 564(b)(1) of the Act, 21 U.S.C. section 360bbb-3(b)(1), unless the authorization is terminated or revoked.     Resp Syncytial Virus by PCR NEGATIVE NEGATIVE    Comment: (NOTE) Fact Sheet for Patients: BloggerCourse.com  Fact Sheet for Healthcare Providers: SeriousBroker.it  This test is not yet approved or cleared by the [place] FDA and has been authorized for detection and/or diagnosis of SARS-CoV-2 by FDA under an Emergency Use Authorization (EUA). This EUA will remain in effect (meaning this test can be used) for the duration of the COVID-19 declaration under Section 564(b)(1) of the Act, 21 U.S.C. section 360bbb-3(b)(1), unless the authorization is terminated or revoked.  Performed at [facility], [address] [place], [place] [address]   Lactic acid, plasma     Status: None   Collection Time: 11/12/22 12:30 AM  Result Value Ref Range   Lactic Acid, Venous 1.9 0.5 - 1.9 mmol/L    Comment: Performed at [facility], [address],  [place], [place] [address]  CBG monitoring, ED     Status: Abnormal   Collection Time: 11/12/22 12:31 AM  Result Value Ref Range   Glucose-Capillary 153 (H) 70 - 99 mg/dL    Comment: Glucose reference range applies only to samples taken after fasting for at least 8 hours.  Glucose, capillary     Status: Abnormal   Collection Time: 11/12/22  1:46 AM  Result Value Ref Range   Glucose-Capillary 163 (H) 70 - 99 mg/dL    Comment: Glucose reference range applies only to samples taken after fasting for at least 8 hours.  APTT     Status: Abnormal   Collection Time: 11/12/22  2:53 AM  Result Value Ref Range   aPTT 37 (H) 24 - 36 seconds    Comment:        IF BASELINE aPTT IS ELEVATED, SUGGEST PATIENT RISK ASSESSMENT BE USED TO DETERMINE APPROPRIATE ANTICOAGULANT THERAPY. Performed at [facility], [address], [place], [place]  [address]   CBC     Status: Abnormal   Collection Time: 11/12/22  2:53 AM  Result Value Ref Range   WBC 11.1 (H) 4.0 - 10.5 K/uL   RBC 3.39 (L) 4.22 - 5.81 MIL/uL   Hemoglobin 9.2 (L) 13.0 - 17.0 g/dL   HCT 19.1 (L) 47.8 - 29.5 %   MCV 86.4 80.0 - 100.0 fL   MCH 27.1 26.0 - 34.0 pg   MCHC 31.4 30.0 - 36.0 g/dL   RDW 62.1 30.8 - 65.7 %   Platelets 294 150 - 400 K/uL   nRBC 0.0 0.0 - 0.2 %    Comment: Performed at [facility], [address] [place], [place] [address]  Basic metabolic panel     Status: Abnormal   Collection Time: 11/12/22  2:53 AM  Result Value Ref Range   Sodium 131 (L) 135 - 145 mmol/L   Potassium 3.8 3.5 - 5.1 mmol/L   Chloride 97 (L) 98 - 111 mmol/L   CO2 24 22 - 32 mmol/L   Glucose, Bld 187 (H) 70 - 99 mg/dL    Comment: Glucose reference range applies only to samples taken after fasting for at least 8 hours.   BUN 18 6 - 20 mg/dL   Creatinine, Ser 2.95 0.61 - 1.24 mg/dL   Calcium 8.5 (L) 8.9 - 10.3 mg/dL   GFR, Estimated >28 >41 mL/min    Comment: (NOTE) Calculated using the CKD-EPI Creatinine Equation (2021)    Anion  gap 10 5 - 15    Comment: Performed at [facility], [address], [place], [place] [address]  Hemoglobin A1c     Status: Abnormal   Collection Time: 11/12/22  2:53 AM  Result Value Ref Range   Hgb A1c MFr Bld 7.0 (H) 4.8 - 5.6 %    Comment: (NOTE) Pre diabetes:          5.7%-6.4%  Diabetes:              >6.4%  Glycemic control for   <7.0% adults with diabetes    Mean Plasma Glucose 154.2 mg/dL    Comment: Performed at [facility], [address], [place], [place] [address]  Glucose, capillary     Status: Abnormal   Collection Time: 11/12/22  8:00 AM  Result Value Ref Range   Glucose-Capillary 175 (H) 70 - 99 mg/dL    Comment: Glucose reference range applies only to samples taken after fasting for at least 8 hours.    MR FOOT RIGHT WO CONTRAST  Result Date: 11/12/2022 CLINICAL DATA:  Foot pain and swelling.  Diabetic. EXAM: MRI OF THE RIGHT FOREFOOT WITHOUT CONTRAST TECHNIQUE: Multiplanar, multisequence MR imaging of the right foot was performed. No intravenous contrast was administered. COMPARISON:  Radiographs 11/11/2022 FINDINGS: Diffuse subcutaneous soft tissue swelling/edema consistent with cellulitis. No discrete fluid collection to suggest a drainable abscess. No suspect small open wound involving the plantar aspect of the great toe. Multiple sites of osteomyelitis. There are changes of septic arthritis involving the interphalangeal joint of the great toe with dorsal subluxation of the distal phalanx and pathologic fracture of the proximal phalanx. Diffuse osteomyelitis involving the proximal and distal phalanges. No definite involvement of the first MTP joint but patchy marrow edema in the midportion of the first metatarsal suspicious for another site of osteomyelitis. Also, the FHL tendon is ruptured. Osteomyelitis involving the proximal, middle and distal phalanges of the second toe. Osteomyelitis involving the  middle and distal phalanges of the fourth toe. Advanced  midfoot neuropathic changes with significant joint space narrowing, degenerative changes and marrow edema. Diffuse myofasciitis without definite MR findings for pyomyositis. IMPRESSION: 1. Diffuse cellulitis and diffuse myofasciitis without definite MR findings for pyomyositis or subcutaneous abscess. 2. Multiple sites of osteomyelitis involving the first, second and fourth toes as detailed above. 3. Septic arthritis involving the interphalangeal joint of the great toe with dorsal subluxation of the distal phalanx and pathologic fracture of the proximal phalanx. 4. Ruptured FHL. 5. Advanced midfoot neuropathic changes. Electronically Signed   By: Rudie Meyer M.D.   On: 11/12/2022 06:57   DG Chest Port 1 View  Result Date: 11/11/2022 CLINICAL DATA:  Questionable sepsis - evaluate for abnormality EXAM: PORTABLE CHEST 1 VIEW COMPARISON:  Chest x-ray 08/28/2017 FINDINGS: The heart and mediastinal contours are within normal limits. No focal consolidation. No pulmonary edema. No pleural effusion. No pneumothorax. No acute osseous abnormality. IMPRESSION: No active disease. Electronically Signed   By: Tish Frederickson M.D.   On: 11/11/2022 22:05   DG Foot Complete Right  Result Date: 11/11/2022 CLINICAL DATA:  Infection. EXAM: RIGHT FOOT COMPLETE - 3 VIEW COMPARISON:  X-ray 09/13/2022 FINDINGS: Soft tissue swelling about the midfoot and great toe. Well corticated plantar and Achilles calcaneal spurs. Mild degenerative changes of the midfoot. No dislocation. However there is a erosive changes involving the base of the distal phalanx of the great toe in the distal aspect of the proximal phalanx medially which is new from previous. There is also a possible fracture of the distal margin of the proximal phalanx of the great toe. Please correlate for evidence of an open wound. IMPRESSION: Developing erosive changes involving the bone surrounding the interphalangeal joint of the great toe with possible fracture of the  distal aspect of the proximal phalanx. Soft tissue swelling. Please correlate for any clinical evidence of infection in addition to trauma. Electronically Signed   By: Karen Kays M.D.   On: 11/11/2022 18:35    Review of Systems  Constitutional:  Negative for chills and fever.  HENT:  Negative for drooling, trouble swallowing and voice change.   Eyes:  Negative for photophobia, redness and visual disturbance.  Respiratory:  Negative for shortness of breath and wheezing.   Musculoskeletal:  Positive for arthralgias and joint swelling.  Skin:  Positive for color change and wound.  Neurological:  Negative for syncope, facial asymmetry and weakness.  Psychiatric/Behavioral:  Negative for agitation, behavioral problems and confusion.    Blood pressure 121/62, pulse 82, temperature 98.4 F (36.9 C), temperature source Oral, resp. rate 16, height 5\' 6"  (1.676 m), weight 99.6 kg, SpO2 93 %. Physical Exam Constitutional:      General: He is not in acute distress.    Appearance: He is obese.  HENT:     Head: Normocephalic and atraumatic.     Nose: Nose normal.     Mouth/Throat:     Mouth: Mucous membranes are moist.  Eyes:     General: No scleral icterus.    Extraocular Movements: Extraocular movements intact.  Cardiovascular:     Pulses: Normal pulses.  Pulmonary:     Effort: Pulmonary effort is normal. No respiratory distress.  Musculoskeletal:     Comments: Examination of the right foot shows gangrenous appearance of the toes.  There is ulceration noted throughout.  Drainage and foul odor is present.  There is erythema that appears improved from yesterday with initiation of antibiotics across the  dorsal foot.  Does appear to be some exposed deep tissue but no obvious bone.  He has minimal tenderness to palpation due to baseline neuropathic changes.  He does have a palpable DP pulse.  His calf is soft and nontender.  Examination left foot shows similar ulceration and gangrenous appearance  of the second and third toes, albeit to a lesser degree.  There is minimal erythema across the dorsal foot.  There is some mild drainage and foul odor.  He has a palpable pulse in this foot as well.  His calf is soft and nontender.  Persists with no significant tenderness due to baseline neuropathic changes on the side as well.  Skin:    Findings: Erythema present.  Neurological:     Mental Status: He is alert and oriented to person, place, and time.  Psychiatric:        Mood and Affect: Mood normal.        Thought Content: Thought content normal.     Assessment/Plan: Right greater than left foot gangrene  We discussed the nature of his infection once again today at length.  Fortunately has had some improvement in his erythema and pain since admission with antibiotic therapy.  We discussed the findings of his MRI scan of the right foot which demonstrates osteomyelitis involving the hallux, second, and fourth toes.  There is also some concerning signal in the first and second metatarsals which may reflect deep infection here as well.  We discussed he will require surgical management in the form on an amputation for source control of his infection at some level - probable transmetatarsal amputation.  For now, we will have him continue with antibiotic therapy, wound care, and dressing changes.  Will follow closely to see how he responds and coordinate timing for surgical management.  He understands his left foot is developing a similar pattern of infection at this time as well that will also require treatment and workup with an MRI scan if it fails to respond to antibiotics. He appears to have intact DP pulses on exam, however given the pattern of his infection he may benefit from vascular consultation at some point.  He was understanding of our plan today.  Glendon J [place] 11/12/2022, 9:00 AM

## 2022-11-13 DIAGNOSIS — Z794 Long term (current) use of insulin: Secondary | ICD-10-CM

## 2022-11-13 DIAGNOSIS — N179 Acute kidney failure, unspecified: Secondary | ICD-10-CM | POA: Diagnosis not present

## 2022-11-13 DIAGNOSIS — E1142 Type 2 diabetes mellitus with diabetic polyneuropathy: Secondary | ICD-10-CM

## 2022-11-13 DIAGNOSIS — I10 Essential (primary) hypertension: Secondary | ICD-10-CM | POA: Diagnosis not present

## 2022-11-13 DIAGNOSIS — I96 Gangrene, not elsewhere classified: Secondary | ICD-10-CM | POA: Diagnosis not present

## 2022-11-13 LAB — BASIC METABOLIC PANEL
Anion gap: 8 (ref 5–15)
BUN: 15 mg/dL (ref 6–20)
CO2: 25 mmol/L (ref 22–32)
Calcium: 9 mg/dL (ref 8.9–10.3)
Chloride: 101 mmol/L (ref 98–111)
Creatinine, Ser: 1.1 mg/dL (ref 0.61–1.24)
GFR, Estimated: >60 mL/min (ref >60)
Glucose, Bld: 159 mg/dL — ABNORMAL HIGH (ref 70–99)
Potassium: 3.8 mmol/L (ref 3.5–5.1)
Sodium: 134 mmol/L — ABNORMAL LOW (ref 135–145)

## 2022-11-13 LAB — CBC WITH DIFFERENTIAL/PLATELET
Abs Immature Granulocytes: 0.03 10*3/uL (ref 0.00–0.07)
Basophils Absolute: 0.1 10*3/uL (ref 0.0–0.1)
Basophils Relative: 1 %
Eosinophils Absolute: 0.6 10*3/uL — ABNORMAL HIGH (ref 0.0–0.5)
Eosinophils Relative: 7 %
HCT: 32.1 % — ABNORMAL LOW (ref 39.0–52.0)
Hemoglobin: 10.2 g/dL — ABNORMAL LOW (ref 13.0–17.0)
Immature Granulocytes: 0 %
Lymphocytes Relative: 28 %
Lymphs Abs: 2.4 10*3/uL (ref 0.7–4.0)
MCH: 27.4 pg (ref 26.0–34.0)
MCHC: 31.8 g/dL (ref 30.0–36.0)
MCV: 86.3 fL (ref 80.0–100.0)
Monocytes Absolute: 0.7 10*3/uL (ref 0.1–1.0)
Monocytes Relative: 8 %
Neutro Abs: 4.7 10*3/uL (ref 1.7–7.7)
Neutrophils Relative %: 56 %
Platelets: 320 10*3/uL (ref 150–400)
RBC: 3.72 MIL/uL — ABNORMAL LOW (ref 4.22–5.81)
RDW: 14.7 % (ref 11.5–15.5)
WBC: 8.4 10*3/uL (ref 4.0–10.5)
nRBC: 0 % (ref 0.0–0.2)

## 2022-11-13 LAB — GLUCOSE, CAPILLARY
Glucose-Capillary: 208 mg/dL — ABNORMAL HIGH (ref 70–99)
Glucose-Capillary: 242 mg/dL — ABNORMAL HIGH (ref 70–99)
Glucose-Capillary: 183 mg/dL — ABNORMAL HIGH (ref 70–99)
Glucose-Capillary: 113 mg/dL — ABNORMAL HIGH (ref 70–99)

## 2022-11-13 LAB — CULTURE, BLOOD (ROUTINE X 2)

## 2022-11-13 NOTE — Consult Note (Signed)
ORTHOPAEDIC CONSULTATION  REQUESTING PHYSICIAN: Glade Lloyd, MD  Chief Complaint: Ulceration of all toes right foot with cellulitis left second toe.  HPI: Kyle [name] is a 62 y.o. male who presents with osteomyelitis ulceration right forefoot with ulceration of the left second toe.  Patient has type 2 diabetes with history of peripheral vascular disease and coronary artery disease.  Past Medical History:  Diagnosis Date   Anemia of unknown etiology    Anxiety disorder    Arthritis    ATHEROSLERO NATV ART EXTREM W/INTERMIT CLAUDICAT 01/26/2009   Qualifier: Diagnosis of  By: Valinda Party RN, Nancy     Back pain of lumbar region with sciatica 01/31/2021   Benign prostatic hyperplasia with urinary frequency    CHEST PAIN UNSPECIFIED 01/26/2009   Qualifier: Diagnosis of  By: Valinda Party RN, Nancy     Class 2 severe obesity due to excess calories with serious comorbidity and body mass index (BMI) of 38.0 to 38.9 in adult [facility])    CLAUDICATION 01/26/2009   Qualifier: Diagnosis of  By: Valinda Party RN, Harriett Sine     Coronary artery disease    minimal CAD '12; NL stress test 06/10/14 ([facility])   Coronary artery disease of native artery of native heart with stable angina pectoris (HCC)    Degeneration of intervertebral disc of lumbosacral region 08/23/2015   Degenerative disc disease, lumbar    Degenerative lumbar disc 11/01/2020   Formatting of this note might be different from the original. Was recommended back surgery. Holding out.   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   Qualifier: Diagnosis of  By: Valinda Party RN, Nancy     Elevated alkaline phosphatase level 11/27/2020   Epididymoorchitis 12/31/2020   Essential (primary) hypertension 08/23/2015   Family history of adverse reaction to  anesthesia    "it made my son sick"   Generalized abdominal pain 08/23/2015   GERD (gastroesophageal reflux disease)    History of arthroplasty of right knee    Hyperlipemia    Iron deficiency anemia due to chronic blood loss 11/07/2020   Metatarsal deformity 11/01/2013   Mild intermittent asthma without complication    Mixed hyperlipidemia    Moderate episode of recurrent major depressive disorder (HCC)    Morbid obesity (HCC) 08/23/2015   Myocardial infarction (HCC)    2012; hospitalized [facility] for chest pain syndrome 02/2011 and LHC showed trivial CAD   Pain in lower limb 12/07/2013   Plantar fasciitis of left foot 11/01/2013   Pneumonia 08/2016   surgery had to be rescheduled due to pneumonia   Primary localized osteoarthritis of left knee 10/14/2016   Primary osteoarthritis involving multiple joints 11/01/2020   Formatting of this note might be different from the original. both knees replaced.   Primary osteoarthritis of left knee 09/06/2016   Primary osteoarthritis of right knee 09/30/2015   Psoriasis    Sepsis, unspecified organism (HCC) 10/20/2015   Type 2 diabetes mellitus without complication, with long-term current use of insulin (HCC) 11/01/2013   Type 2 diabetes mellitus without complications (HCC) 08/23/2015   Uncomplicated opioid dependence (HCC)    Vitamin B12 deficiency 11/02/2020   Past Surgical History:  Procedure Laterality Date   CARDIAC CATHETERIZATION     03/28/11 LHC: NL  LM, LAD; 10% mCX, mRCA. EF 65%. (Dr. Dot Been, [facility])   CHOLECYSTECTOMY     CORONARY ANGIOPLASTY     2012 [facility]    ESOPHAGEAL DILATION     SEVERAL TIMES   Heel Spur Resection with Fasiotomy Left 11/25/2013   @ [facility]   KNEE ARTHROSCOPY Bilateral    SHOULDER ARTHROSCOPY WITH OPEN ROTATOR CUFF REPAIR Right    TOTAL KNEE ARTHROPLASTY Right 10/04/2015   Procedure: RIGHT TOTAL KNEE ARTHROPLASTY;  Surgeon: Gean Birchwood, MD;  Location: [facility] OR;  Service: Orthopedics;  Laterality: Right;   TOTAL KNEE ARTHROPLASTY  Left 10/14/2016   TOTAL KNEE ARTHROPLASTY Left 10/14/2016   Procedure: LEFT TOTAL KNEE ARTHROPLASTY;  Surgeon: Gean Birchwood, MD;  Location: [facility] OR;  Service: Orthopedics;  Laterality: Left;   TUMOR REMOVAL     FATTY TUMOR  RT SHOULDER   Social History   Socioeconomic History   Marital status: Married    Spouse name: Not on file   Number of children: Not on file   Years of education: Not on file   Highest education level: Not on file  Occupational History   Not on file  Tobacco Use   Smoking status: Never   Smokeless tobacco: Never  Substance and Sexual Activity   Alcohol use: No   Drug use: No   Sexual activity: Not on file  Other Topics Concern   Not on file  Social History Narrative   Not on file   Social Determinants of Health   Financial Resource Strain: Not on file  Food Insecurity: No Food Insecurity (11/12/2022)   Hunger Vital Sign    Worried About Running Out of Food in the Last Year: Never true    Ran Out of Food in the Last Year: Never true  Transportation Needs: No Transportation Needs (11/12/2022)   PRAPARE - Administrator, Civil Service (Medical): No    Lack of Transportation (Non-Medical): No  Physical Activity: Not on file  Stress: Not on file  Social Connections: Not on file   Family History  Problem Relation Age of Onset   Cancer Mother    Diabetes Mother    Hyperlipidemia Father    Hypertension Father    Heart failure Father    Heart failure Brother    - negative except otherwise stated in the family history section Allergies  Allergen Reactions   Penicillins Hives, Swelling and Rash    Has patient had a PCN reaction causing immediate rash, facial/tongue/throat swelling, SOB or lightheadedness with hypotension: No Has patient had a PCN reaction causing severe rash involving mucus membranes or skin necrosis: No Has patient had a PCN reaction that required hospitalization No Has patient had a PCN reaction occurring within the last 10  years: No If all of the above answers are "NO", then may proceed with Cephalosporin use.    Cymbalta [Duloxetine Hcl] Other (See Comments)    "makes me crazy" depression   Prior to Admission medications   Medication Sig Start Date End Date Taking? Authorizing Provider  albuterol (VENTOLIN HFA) 108 (90 Base) MCG/ACT inhaler Inhale 2 puffs into the lungs every 6 (six) hours as needed for wheezing or shortness of breath. 04/20/20  Yes [provider]  aspirin 325 MG tablet Take 325 mg by mouth daily.   Yes [provider]  Buprenorphine HCl-Naloxone HCl 8-2 MG FILM Place 2 Film under the tongue daily as needed (for severe pain).   Yes [provider]  buPROPion (WELLBUTRIN) 100 MG tablet Take 100 mg by mouth daily. 11/06/20  Yes [provider]  busPIRone (BUSPAR) 10 MG tablet Take 10 mg by mouth 2 (two) times daily. 09/23/20  Yes [provider]  clonazePAM (KLONOPIN) 1 MG tablet Take 1 mg by mouth at bedtime as needed for anxiety (sleep).   Yes [provider]  doxycycline (ADOXA) 100 MG tablet Take 100 mg by mouth 2 (two) times daily. 11/11/22  Yes [provider]  fenofibrate (TRICOR) 48 MG tablet Take 48 mg by mouth daily. 11/08/20  Yes [provider]  ferrous sulfate 325 (65 FE) MG tablet Take 325 mg by mouth daily with breakfast. 09/22/22  Yes [provider]  gabapentin (NEURONTIN) 300 MG capsule Take 1 capsule (300 mg total) by mouth 4 (four) times daily. 10/18/22  Yes Vivi Barrack, DPM  glipiZIDE (GLUCOTROL) 10 MG tablet Take 10 mg by mouth 2 (two) times daily before a meal. 09/12/22  Yes [provider]  HUMALOG KWIKPEN 100 UNIT/ML KwikPen Inject 15 Units into the skin 3 (three) times daily before meals. 09/03/22  Yes [provider]  insulin glargine, 1 Unit Dial, (TOUJEO SOLOSTAR) 300 UNIT/ML Solostar Pen Inject 50 Units into the skin daily in the afternoon. 04/03/22  Yes Shamleffer, Konrad Dolores, MD  magnesium oxide (MAG-OX) 400 MG tablet Take 400 mg by mouth 2 (two) times daily. 09/22/22  Yes [provider]  metFORMIN (GLUCOPHAGE) 500 MG tablet Take 2 tablets (1,000 mg total) by mouth 2 (two) times daily. 04/03/22  Yes Shamleffer, Konrad Dolores, MD  metoprolol succinate (TOPROL-XL) 25 MG 24 hr tablet Take 25 mg by mouth daily. 09/06/15  Yes [provider]  Multiple Vitamins-Minerals (CENTRUM SILVER 50+MEN) TABS Take 1 tablet by mouth daily.   Yes [provider]  nitroGLYCERIN (NITROSTAT) 0.4 MG SL tablet Place 1 tablet (0.4 mg total) under the tongue every 5 (five) minutes as needed for chest pain. 02/23/21  Yes Revankar, Aundra Dubin, MD  omeprazole (PRILOSEC) 40 MG capsule Take 40 mg by mouth daily.   Yes [provider]  PARoxetine (PAXIL) 40 MG tablet Take 40 mg by mouth daily. 08/13/16  Yes [provider]  rosuvastatin (CRESTOR) 10 MG tablet Take 10 mg by mouth at bedtime. 09/06/15  Yes [provider]  tamsulosin (FLOMAX) 0.4 MG CAPS capsule Take 0.8 mg by mouth daily. 11/01/20  Yes [provider]  triamcinolone cream (KENALOG) 0.1 % Apply 1 application topically 2 (two) times daily as needed for rash. 10/25/20  Yes [provider]  Continuous Blood Gluc Sensor (DEXCOM G7 SENSOR) MISC 1 Device by Does not apply route as directed. 04/03/22   Shamleffer, Konrad Dolores, MD  ranolazine (RANEXA) 500 MG 12 hr tablet Take 1 tablet (500 mg total) by mouth 2 (two) times daily. Patient not taking: Reported on 11/12/2022 12/18/20   Revankar, Aundra Dubin, MD   MR FOOT LEFT W WO CONTRAST  Result Date: 11/12/2022 CLINICAL DATA:  Soft tissue infection suspected, second toe. EXAM: MRI OF THE LEFT FOREFOOT WITHOUT AND WITH CONTRAST TECHNIQUE: Multiplanar, multisequence MR imaging of the left forefoot was performed both before and after administration of intravenous contrast. CONTRAST:  10mL GADAVIST GADOBUTROL 1 MMOL/ML IV SOLN  COMPARISON:  None available FINDINGS: Bones/Joint/Cartilage There is high-grade marrow edema and enhancement of the distal phalanx of the second toe with more moderate edema and enhancement of the second toe middle phalanx. Possible mild erosion of the distal  aspect of the distal phalanx of the second toe concerning for acute osteomyelitis. Question minimal increased fluid sensitive signal within the third through fifth distal phalanges, however this is favored to be secondary to inhomogeneous fat saturation and artifact. No surrounding soft tissue swelling or overlying soft tissue ulcers are seen to a secondary concern for acute osteomyelitis of these toes. There are moderate degenerative disc cystic changes in the distal dorsal medial aspect of the great toe metatarsal head. Mild great toe metatarsophalangeal joint space narrowing and peripheral osteophytosis. Ligaments The Lisfranc ligament complex is intact. The metacarpophalangeal and interphalangeal collateral ligaments appear intact. The great toe sesamoid-phalangeal plantar plate ligaments are intact. Muscles and Tendons The flexor and extensor tendons are intact. Normal size and signal of the regional musculature. Soft tissues There is moderate edema and enhancement of a dorsal greater than plantar aspects of the second toe soft tissues. No definitive soft tissue ulceration is seen. No rim enhancing abscess. IMPRESSION: 1. There is moderate edema and enhancement of a dorsal greater than plantar aspects of the second toe soft tissues. No definitive soft tissue ulceration is seen. No rim enhancing abscess. 2. High-grade marrow edema and enhancement of the distal phalanx of the second toe with more moderate edema and enhancement of the second toe middle phalanx. Possible mild erosion of the distal aspect of the distal phalanx of the second toe concerning for acute osteomyelitis. Electronically Signed   By: Neita Garnet M.D.   On: 11/12/2022 19:52   MR FOOT  RIGHT WO CONTRAST  Result Date: 11/12/2022 CLINICAL DATA:  Foot pain and swelling.  Diabetic. EXAM: MRI OF THE RIGHT FOREFOOT WITHOUT CONTRAST TECHNIQUE: Multiplanar, multisequence MR imaging of the right foot was performed. No intravenous contrast was administered. COMPARISON:  Radiographs 11/11/2022 FINDINGS: Diffuse subcutaneous soft tissue swelling/edema consistent with cellulitis. No discrete fluid collection to suggest a drainable abscess. No suspect small open wound involving the plantar aspect of the great toe. Multiple sites of osteomyelitis. There are changes of septic arthritis involving the interphalangeal joint of the great toe with dorsal subluxation of the distal phalanx and pathologic fracture of the proximal phalanx. Diffuse osteomyelitis involving the proximal and distal phalanges. No definite involvement of the first MTP joint but patchy marrow edema in the midportion of the first metatarsal suspicious for another site of osteomyelitis. Also, the FHL tendon is ruptured. Osteomyelitis involving the proximal, middle and distal phalanges of the second toe. Osteomyelitis involving the middle and distal phalanges of the fourth toe. Advanced midfoot neuropathic changes with significant joint space narrowing, degenerative changes and marrow edema. Diffuse myofasciitis without definite MR findings for pyomyositis. IMPRESSION: 1. Diffuse cellulitis and diffuse myofasciitis without definite MR findings for pyomyositis or subcutaneous abscess. 2. Multiple sites of osteomyelitis involving the first, second and fourth toes as detailed above. 3. Septic arthritis involving the interphalangeal joint of the great toe with dorsal subluxation of the distal phalanx and pathologic fracture of the proximal phalanx. 4. Ruptured FHL. 5. Advanced midfoot neuropathic changes. Electronically Signed   By: Rudie Meyer M.D.   On: 11/12/2022 06:57   DG Chest Port 1 View  Result Date: 11/11/2022 CLINICAL DATA:   Questionable sepsis - evaluate for abnormality EXAM: PORTABLE CHEST 1 VIEW COMPARISON:  Chest x-ray 08/28/2017 FINDINGS: The heart and mediastinal contours are within normal limits. No focal consolidation. No pulmonary edema. No pleural effusion. No pneumothorax. No acute osseous abnormality. IMPRESSION: No active disease. Electronically Signed   By: Normajean Glasgow.D.  On: 11/11/2022 22:05   DG Foot Complete Right  Result Date: 11/11/2022 CLINICAL DATA:  Infection. EXAM: RIGHT FOOT COMPLETE - 3 VIEW COMPARISON:  X-ray 09/13/2022 FINDINGS: Soft tissue swelling about the midfoot and great toe. Well corticated plantar and Achilles calcaneal spurs. Mild degenerative changes of the midfoot. No dislocation. However there is a erosive changes involving the base of the distal phalanx of the great toe in the distal aspect of the proximal phalanx medially which is new from previous. There is also a possible fracture of the distal margin of the proximal phalanx of the great toe. Please correlate for evidence of an open wound. IMPRESSION: Developing erosive changes involving the bone surrounding the interphalangeal joint of the great toe with possible fracture of the distal aspect of the proximal phalanx. Soft tissue swelling. Please correlate for any clinical evidence of infection in addition to trauma. Electronically Signed   By: Karen Kays M.D.   On: 11/11/2022 18:35   - pertinent xrays, CT, MRI studies were reviewed and independently interpreted  Positive ROS: All other systems have been reviewed and were otherwise negative with the exception of those mentioned in the HPI and as above.  Physical Exam: General: Alert, no acute distress Psychiatric: Patient is competent for consent with normal mood and affect Lymphatic: No axillary or cervical lymphadenopathy Cardiovascular: No pedal edema Respiratory: No cyanosis, no use of accessory musculature GI: No organomegaly, abdomen is soft and  non-tender    Images:  @  Labs:  Lab Results  Component Value Date   HGBA1C 7.0 (H) 11/12/2022   HGBA1C 9.2 (H) 10/14/2016   HGBA1C 8.8 (H) 09/03/2016   ESRSEDRATE 79 (H) 11/11/2022   CRP 18.4 (H) 11/11/2022   REPTSTATUS PENDING 11/11/2022   CULT  11/11/2022    NO GROWTH 2 DAYS Performed at [facility], [address], [place], [place] [address]     Lab Results  Component Value Date   ALBUMIN 3.4 (L) 11/11/2022        Latest Ref Rng & Units 11/13/2022    1:06 AM 11/12/2022    2:53 AM 11/11/2022    6:00 PM  CBC EXTENDED  WBC 4.0 - 10.5 K/uL 8.4  11.1  13.1   RBC 4.22 - 5.81 MIL/uL 3.72  3.39  4.01   Hemoglobin 13.0 - 17.0 g/dL 65.7  9.2  84.6   HCT 96.2 - 52.0 % 32.1  29.3  35.5   Platelets 150 - 400 K/uL 320  294  361   NEUT# 1.7 - 7.7 K/uL 4.7   9.6   Lymph# 0.7 - 4.0 K/uL 2.4   1.9     Neurologic: Patient does not have protective sensation bilateral lower extremities.   MUSCULOSKELETAL:   Skin: Examination patient has cellulitis of the left foot second toe the area of demarcation of the cellulitis is improving.  There is a superficial ulcer on the second toe but no sausage digit swelling.  Patient has a palpable pulse on the left.  Review of the MRI scan of the left foot shows edema in the toe consistent with osteomyelitis.  Examination of the right lower extremity there is necrotic changes to toes 1 through 4 with ulceration and exposed bone.  Patient has a palpable dorsalis pedis pulse on the right.  Review of the MRI scan shows osteomyelitis involving toes 123 and 4.  There is some edema in the midshaft of the first metatarsal.  There is no associated ulcer swelling or  cellulitis.  This may be a stress reaction.  White blood cell count 8.3 with hemoglobin 10.2.  Most recent hemoglobin A1c 7.0 with a sed rate of 79 and a C-reactive protein of 18.4.  Assessment: Assessment: Diabetic insensate neuropathy with peripheral vascular disease with  palpable pulses at the ankle with osteomyelitis of the left second toe and osteomyelitis with necrotic changes of toes 1 through 4 on the right foot.  Plan: Plan: Will plan for transmetatarsal amputation on the right.  Risks and benefits were discussed including risk of the wound not healing need for additional surgery.  Patient states he understands wished to proceed at this time. Anticipate once patient is able to heal the transmetatarsal amputation on the right and is safe with ambulation we could proceed with amputation of the left second toe.  Plan for right transmetatarsal amputation on Friday.  Thank you for the consult and the opportunity to see Mr. Jancarlos Thrun, MD [facility] [phone] 7:47 AM

## 2022-11-13 NOTE — Progress Notes (Signed)
PROGRESS NOTE    Kyle [name]  ZOX:[id number] DOB: October 07, 1960 DOA: 11/11/2022 PCP: [name], MD   Brief Narrative:  62 year old gentleman with history of coronary artery disease, insulin-dependent type 2 diabetes, hypertension, hyperlipidemia, anemia, psoriasis, chronic right foot ulcer presented from orthopedics office because of concern for foot infection.  He was admitted for osteomyelitis of left foot with necrotic changes.  Started on broad-spectrum antibiotics and orthopedics was consulted.  Assessment & Plan:   Acute osteomyelitis of right foot/dry gangrene/ulcer of toes of right foot with superimposed cellulitis -MRI with evidence of osteomyelitis of the toes.  Currently on broad-spectrum antibiotics.  Blood cultures negative so far -Orthopedics following and planning for right transmetatarsal amputation on Friday.  Acute kidney injury -Creatinine 1.6 on presentation.  Resolved and creatinine 1.1 today.  Treated with IV fluids and subsequently discontinued.  Diabetes mellitus type 2 uncontrolled with hyperglycemia -Metformin on hold.  Continue CBGs with SSI along with NovoLog with meals and long-acting insulin.  A1c 7.  Carb modified diet.  Leukocytosis -Resolved  Hyponatremia -Mild.  Monitor intermittently.  Anemia of chronic disease -From chronic illnesses.  Hemoglobin stable.  Hypertension Hyperlipidemia Coronary artery disease -Continue aspirin, statin and metoprolol succinate.  Blood pressure currently stable.  Outpatient follow-up with PCP/cardiology  Depression/anxiety -Continue paroxetine, bupropion, buspirone and as needed clonazepam  Chronic pain -On Suboxone as an outpatient which has been continued.  Continue gabapentin and as needed Norco  BPH -Continue tamsulosin  Obesity -Outpatient follow-up    DVT prophylaxis: Heparin subcutaneous Code Status: Full Family Communication: Family members at bedside Disposition Plan: Status is:  Inpatient Remains inpatient appropriate because: Of severity of illness.  Consultants: Orthopedics  Procedures: None  Antimicrobials: Vancomycin, cefepime and Flagyl from 11/11/2022 onwards  Subjective: Patient seen and examined at bedside.  Complains of intermittent right foot pain.  No fever, vomiting, shortness of breath reported.  Objective: Vitals:   11/12/22 1356 11/12/22 2032 11/13/22 0418 11/13/22 0735  BP: 120/74 (!) 120/57 126/72 (!) 141/81  Pulse: 78 62 78 77  Resp: Temp: 98.2 F (36.8 C) 98.5 F (36.9 C) 98 F (36.7 C) 97.7 F (36.5 C)  TempSrc:    Oral  SpO2: 95% 98% 98% 99%  Weight:      Height:        Intake/Output Summary (Last 24 hours) at 11/13/2022 1133 Last data filed at 11/13/2022 0339 Gross per 24 hour  Intake 650 ml  Output --  Net 650 ml   Filed Weights   11/12/22 0200  Weight: 99.6 kg    Examination:  General exam: Appears calm and comfortable.  Looks chronically ill and deconditioned. Respiratory system: Bilateral decreased breath sounds at bases with some scattered crackles Cardiovascular system: S1 & S2 heard, Rate controlled Gastrointestinal system: Abdomen is obese, nondistended, soft and nontender. Normal bowel sounds heard. Extremities: No cyanosis, clubbing; lower extremity edema present.  Right foot has dressing present  Central nervous system: Alert and oriented.  Slow to respond.  Poor historian no focal neurological deficits. Moving extremities Skin: No obvious ecchymosis/lesions Psychiatry: Flat affect.  Not agitated.    Data Reviewed: I have personally reviewed following labs and imaging studies  CBC: Recent Labs  Lab 11/11/22 1800 11/12/22 0253 11/13/22 0106  WBC 13.1* 11.1* 8.4  NEUTROABS 9.6*  --  4.7  HGB 11.0* 9.2* 10.2*  HCT 35.5* 29.3* 32.1*  MCV 88.5 86.4 86.3  PLT 361 294 320   Basic Metabolic Panel: Recent  Labs  Lab 11/11/22 1800 11/12/22 0253 11/13/22 0106  NA 133* 131* 134*  K 4.2  3.8 3.8  CL 95* 97* 101  CO2 25 24 25   GLUCOSE 275* 187* 159*  BUN 17 18 15   CREATININE 1.60* 1.22 1.10  CALCIUM 9.4 8.5* 9.0   GFR: Estimated Creatinine Clearance: 78.9 mL/min (by C-G formula based on SCr of 1.1 mg/dL). Liver Function Tests: Recent Labs  Lab 11/11/22 1800  AST 21  ALT 13  ALKPHOS 113  BILITOT 0.5  PROT 8.9*  ALBUMIN 3.4*   No results for input(s): "LIPASE", "AMYLASE" in the last 168 hours. No results for input(s): "AMMONIA" in the last 168 hours. Coagulation Profile: Recent Labs  Lab 11/11/22 1800  INR 1.2   Cardiac Enzymes: No results for input(s): "CKTOTAL", "CKMB", "CKMBINDEX", "TROPONINI" in the last 168 hours. BNP (last 3 results) No results for input(s): "PROBNP" in the last 8760 hours. HbA1C: Recent Labs    11/12/22 0253  HGBA1C 7.0*   CBG: Recent Labs  Lab 11/12/22 1113 11/12/22 1621 11/12/22 2054 11/13/22 0739 11/13/22 1118  GLUCAP 237* 225* 164* 208* 242*   Lipid Profile: No results for input(s): "CHOL", "HDL", "LDLCALC", "TRIG", "CHOLHDL", "LDLDIRECT" in the last 72 hours. Thyroid Function Tests: No results for input(s): "TSH", "T4TOTAL", "FREET4", "T3FREE", "THYROIDAB" in the last 72 hours. Anemia Panel: No results for input(s): "VITAMINB12", "FOLATE", "FERRITIN", "TIBC", "IRON", "RETICCTPCT" in the last 72 hours. Sepsis Labs: Recent Labs  Lab 11/11/22 1800 11/12/22 0030  LATICACIDVEN 3.9* 1.9    Recent Results (from the past 240 hour(s))  Culture, blood (Routine x 2)     Status: None (Preliminary result)   Collection Time: 11/11/22  6:00 PM   Specimen: BLOOD LEFT ARM  Result Value Ref Range Status   Specimen Description BLOOD LEFT ARM  Final   Special Requests   Final    BOTTLES DRAWN AEROBIC AND ANAEROBIC Blood Culture adequate volume   Culture   Final    NO GROWTH 2 DAYS Performed at [facility], [address], [place], [place] [address]    Report Status PENDING  Incomplete  Culture, blood (Routine x  2)     Status: None (Preliminary result)   Collection Time: 11/11/22  9:40 PM   Specimen: BLOOD  Result Value Ref Range Status   Specimen Description BLOOD LEFT ANTECUBITAL  Final   Special Requests   Final    BOTTLES DRAWN AEROBIC AND ANAEROBIC Blood Culture adequate volume   Culture   Final    NO GROWTH 2 DAYS Performed at [facility], [address] [place], [place] [address]    Report Status PENDING  Incomplete  Resp panel by RT-PCR (RSV, Flu A&B, Covid) Anterior Nasal Swab     Status: None   Collection Time: 11/11/22  9:46 PM   Specimen: Anterior Nasal Swab  Result Value Ref Range Status   SARS Coronavirus 2 by RT PCR NEGATIVE NEGATIVE Final   Influenza A by PCR NEGATIVE NEGATIVE Final   Influenza B by PCR NEGATIVE NEGATIVE Final    Comment: (NOTE) The Xpert Xpress SARS-CoV-2/FLU/RSV plus assay is intended as an aid in the diagnosis of influenza from Nasopharyngeal swab specimens and should not be used as a sole basis for treatment. Nasal washings and aspirates are unacceptable for Xpert Xpress SARS-CoV-2/FLU/RSV testing.  Fact Sheet for Patients: BloggerCourse.com  Fact Sheet for Healthcare Providers: SeriousBroker.it  This test is not yet approved or cleared by the  [place] [employer] and has been authorized for detection and/or diagnosis of SARS-CoV-2 by FDA under an TEFL teacher (EUA). This EUA will remain in effect (meaning this test can be used) for the duration of the COVID-19 declaration under Section 564(b)(1) of the Act, 21 U.S.C. section 360bbb-3(b)(1), unless the authorization is terminated or revoked.     Resp Syncytial Virus by PCR NEGATIVE NEGATIVE Final    Comment: (NOTE) Fact Sheet for Patients: BloggerCourse.com  Fact Sheet for Healthcare Providers: SeriousBroker.it  This test is not yet approved or cleared by the [place]  FDA and has been authorized for detection and/or diagnosis of SARS-CoV-2 by FDA under an Emergency Use Authorization (EUA). This EUA will remain in effect (meaning this test can be used) for the duration of the COVID-19 declaration under Section 564(b)(1) of the Act, 21 U.S.C. section 360bbb-3(b)(1), unless the authorization is terminated or revoked.  Performed at [facility], [address] [place], [place] [address]          Radiology Studies: MR FOOT LEFT W WO CONTRAST  Result Date: 11/12/2022 CLINICAL DATA:  Soft tissue infection suspected, second toe. EXAM: MRI OF THE LEFT FOREFOOT WITHOUT AND WITH CONTRAST TECHNIQUE: Multiplanar, multisequence MR imaging of the left forefoot was performed both before and after administration of intravenous contrast. CONTRAST:  1mL GADAVIST GADOBUTROL 1 MMOL/ML IV SOLN COMPARISON:  None available FINDINGS: Bones/Joint/Cartilage There is high-grade marrow edema and enhancement of the distal phalanx of the second toe with more moderate edema and enhancement of the second toe middle phalanx. Possible mild erosion of the distal aspect of the distal phalanx of the second toe concerning for acute osteomyelitis. Question minimal increased fluid sensitive signal within the third through fifth distal phalanges, however this is favored to be secondary to inhomogeneous fat saturation and artifact. No surrounding soft tissue swelling or overlying soft tissue ulcers are seen to a secondary concern for acute osteomyelitis of these toes. There are moderate degenerative disc cystic changes in the distal dorsal medial aspect of the great toe metatarsal head. Mild great toe metatarsophalangeal joint space narrowing and peripheral osteophytosis. Ligaments The Lisfranc ligament complex is intact. The metacarpophalangeal and interphalangeal collateral ligaments appear intact. The great toe sesamoid-phalangeal plantar plate ligaments are intact. Muscles and Tendons The  flexor and extensor tendons are intact. Normal size and signal of the regional musculature. Soft tissues There is moderate edema and enhancement of a dorsal greater than plantar aspects of the second toe soft tissues. No definitive soft tissue ulceration is seen. No rim enhancing abscess. IMPRESSION: 1. There is moderate edema and enhancement of a dorsal greater than plantar aspects of the second toe soft tissues. No definitive soft tissue ulceration is seen. No rim enhancing abscess. 2. High-grade marrow edema and enhancement of the distal phalanx of the second toe with more moderate edema and enhancement of the second toe middle phalanx. Possible mild erosion of the distal aspect of the distal phalanx of the second toe concerning for acute osteomyelitis. Electronically Signed   By: Neita Garnet M.D.   On: 11/12/2022 19:52   MR FOOT RIGHT WO CONTRAST  Result Date: 11/12/2022 CLINICAL DATA:  Foot pain and swelling.  Diabetic. EXAM: MRI OF THE RIGHT FOREFOOT WITHOUT CONTRAST TECHNIQUE: Multiplanar, multisequence MR imaging of the right foot was performed. No intravenous contrast was administered. COMPARISON:  Radiographs 11/11/2022 FINDINGS: Diffuse subcutaneous soft tissue swelling/edema consistent with cellulitis. No discrete fluid collection to suggest a drainable abscess. No suspect  small open wound involving the plantar aspect of the great toe. Multiple sites of osteomyelitis. There are changes of septic arthritis involving the interphalangeal joint of the great toe with dorsal subluxation of the distal phalanx and pathologic fracture of the proximal phalanx. Diffuse osteomyelitis involving the proximal and distal phalanges. No definite involvement of the first MTP joint but patchy marrow edema in the midportion of the first metatarsal suspicious for another site of osteomyelitis. Also, the FHL tendon is ruptured. Osteomyelitis involving the proximal, middle and distal phalanges of the second toe.  Osteomyelitis involving the middle and distal phalanges of the fourth toe. Advanced midfoot neuropathic changes with significant joint space narrowing, degenerative changes and marrow edema. Diffuse myofasciitis without definite MR findings for pyomyositis. IMPRESSION: 1. Diffuse cellulitis and diffuse myofasciitis without definite MR findings for pyomyositis or subcutaneous abscess. 2. Multiple sites of osteomyelitis involving the first, second and fourth toes as detailed above. 3. Septic arthritis involving the interphalangeal joint of the great toe with dorsal subluxation of the distal phalanx and pathologic fracture of the proximal phalanx. 4. Ruptured FHL. 5. Advanced midfoot neuropathic changes. Electronically Signed   By: Rudie Meyer M.D.   On: 11/12/2022 06:57   DG Chest Port 1 View  Result Date: 11/11/2022 CLINICAL DATA:  Questionable sepsis - evaluate for abnormality EXAM: PORTABLE CHEST 1 VIEW COMPARISON:  Chest x-ray 08/28/2017 FINDINGS: The heart and mediastinal contours are within normal limits. No focal consolidation. No pulmonary edema. No pleural effusion. No pneumothorax. No acute osseous abnormality. IMPRESSION: No active disease. Electronically Signed   By: Tish Frederickson M.D.   On: 11/11/2022 22:05   DG Foot Complete Right  Result Date: 11/11/2022 CLINICAL DATA:  Infection. EXAM: RIGHT FOOT COMPLETE - 3 VIEW COMPARISON:  X-ray 09/13/2022 FINDINGS: Soft tissue swelling about the midfoot and great toe. Well corticated plantar and Achilles calcaneal spurs. Mild degenerative changes of the midfoot. No dislocation. However there is a erosive changes involving the base of the distal phalanx of the great toe in the distal aspect of the proximal phalanx medially which is new from previous. There is also a possible fracture of the distal margin of the proximal phalanx of the great toe. Please correlate for evidence of an open wound. IMPRESSION: Developing erosive changes involving the bone  surrounding the interphalangeal joint of the great toe with possible fracture of the distal aspect of the proximal phalanx. Soft tissue swelling. Please correlate for any clinical evidence of infection in addition to trauma. Electronically Signed   By: Karen Kays M.D.   On: 11/11/2022 18:35        Scheduled Meds:  aspirin  325 mg Oral Daily   buprenorphine-naloxone  1 tablet Sublingual Daily   buPROPion  100 mg Oral Daily   busPIRone  10 mg Oral BID   ferrous sulfate  325 mg Oral Q breakfast   gabapentin  300 mg Oral QID   heparin  5,000 Units Subcutaneous Q8H   insulin aspart  0-15 Units Subcutaneous TID WC   insulin aspart  0-5 Units Subcutaneous QHS   insulin aspart  10 Units Subcutaneous TID WC   insulin glargine-yfgn  50 Units Subcutaneous Daily   metoprolol succinate  25 mg Oral Daily   multivitamin with minerals  1 tablet Oral Daily   pantoprazole  40 mg Oral Daily   PARoxetine  40 mg Oral Daily   rosuvastatin  20 mg Oral QHS   tamsulosin  0.8 mg Oral Daily   Continuous  Infusions:  ceFEPime (MAXIPIME) IV 2 g (11/13/22 1032)   vancomycin 1,250 mg (11/12/22 2203)          Glade Lloyd, MD [facility] 11/13/2022, 11:33 AM

## 2022-11-14 DIAGNOSIS — I96 Gangrene, not elsewhere classified: Secondary | ICD-10-CM | POA: Diagnosis not present

## 2022-11-14 DIAGNOSIS — N179 Acute kidney failure, unspecified: Secondary | ICD-10-CM | POA: Diagnosis not present

## 2022-11-14 LAB — BASIC METABOLIC PANEL
Anion gap: 9 (ref 5–15)
BUN: 17 mg/dL (ref 6–20)
CO2: 26 mmol/L (ref 22–32)
Calcium: 9.1 mg/dL (ref 8.9–10.3)
Chloride: 101 mmol/L (ref 98–111)
Creatinine, Ser: 1.18 mg/dL (ref 0.61–1.24)
GFR, Estimated: >60 mL/min (ref >60)
Glucose, Bld: 133 mg/dL — ABNORMAL HIGH (ref 70–99)
Potassium: 4.2 mmol/L (ref 3.5–5.1)
Sodium: 136 mmol/L (ref 135–145)

## 2022-11-14 LAB — CULTURE, BLOOD (ROUTINE X 2)

## 2022-11-14 LAB — GLUCOSE, CAPILLARY
Glucose-Capillary: 181 mg/dL — ABNORMAL HIGH (ref 70–99)
Glucose-Capillary: 204 mg/dL — ABNORMAL HIGH (ref 70–99)
Glucose-Capillary: 170 mg/dL — ABNORMAL HIGH (ref 70–99)
Glucose-Capillary: 142 mg/dL — ABNORMAL HIGH (ref 70–99)

## 2022-11-14 LAB — MAGNESIUM: Magnesium: 1.6 mg/dL — ABNORMAL LOW (ref 1.7–2.4)

## 2022-11-14 LAB — MRSA NEXT GEN BY PCR, NASAL: MRSA by PCR Next Gen: NOT DETECTED

## 2022-11-14 MED ORDER — CEFAZOLIN SODIUM-DEXTROSE 2-4 GM/100ML-% IV SOLN
2.0000 g | INTRAVENOUS | Status: AC
  Administered 2022-11-15: 2 g via INTRAVENOUS
  Filled 2022-11-14: qty 100

## 2022-11-14 MED ORDER — CHLORHEXIDINE GLUCONATE 4 % EX SOLN
60.0000 mL | Freq: Once | CUTANEOUS | Status: AC
  Administered 2022-11-15: 4 via TOPICAL
  Filled 2022-11-14: qty 60

## 2022-11-14 MED ORDER — MAGNESIUM SULFATE 2 GM/50ML IV SOLN
2.0000 g | Freq: Once | INTRAVENOUS | Status: AC
  Administered 2022-11-14: 2 g via INTRAVENOUS
  Filled 2022-11-14: qty 50

## 2022-11-14 MED ORDER — CHLORHEXIDINE GLUCONATE 4 % EX SOLN: 60.0000 mL | Freq: Once | CUTANEOUS | Status: DC

## 2022-11-14 MED ORDER — POVIDONE-IODINE 10 % EX SWAB: 2.0000 | Freq: Once | CUTANEOUS | Status: DC

## 2022-11-14 MED ORDER — POVIDONE-IODINE 10 % EX SWAB
2.0000 | Freq: Once | CUTANEOUS | Status: AC
  Administered 2022-11-15: 2 via TOPICAL

## 2022-11-14 NOTE — Progress Notes (Signed)
PROGRESS NOTE    Kyle [name]  ZOX:[id number] DOB: Aug 22, 1960 DOA: 11/11/2022 PCP: [name], MD   Brief Narrative:  62 year old gentleman with history of coronary artery disease, insulin-dependent type 2 diabetes, hypertension, hyperlipidemia, anemia, psoriasis, chronic right foot ulcer presented from orthopedics office because of concern for foot infection.  He was admitted for osteomyelitis of left foot with necrotic changes.  He was started on broad-spectrum antibiotics and orthopedics was consulted.  Assessment & Plan:   Acute osteomyelitis of right foot/dry gangrene/ulcer of toes of right foot with superimposed cellulitis Severe sepsis: Present on admission, from above Lactic acidosis: Present on admission -Presented with tachycardia, leukocytosis, lactic acidosis with evidence of acute osteomyelitis. -MRI with evidence of osteomyelitis of the toes.  Currently on broad-spectrum antibiotics.  Blood cultures negative so far -Orthopedics following and planning for right transmetatarsal amputation on Friday.  Acute kidney injury -Creatinine 1.6 on presentation.  Resolved and creatinine 1.18 today.  Treated with IV fluids and subsequently discontinued.  Diabetes mellitus type 2 uncontrolled with hyperglycemia -Metformin on hold.  Continue CBGs with SSI along with NovoLog with meals and long-acting insulin.  A1c 7.  Carb modified diet.  Leukocytosis -Resolved  Hyponatremia -Resolved.  Hypomagnesemia -Replace.  Repeat a.m. labs  Anemia of chronic disease -From chronic illnesses.  Hemoglobin stable.  Hypertension Hyperlipidemia Coronary artery disease -Continue aspirin, statin and metoprolol succinate.  Blood pressure currently stable.  Outpatient follow-up with PCP/cardiology  Depression/anxiety -Continue paroxetine, bupropion, buspirone and as needed clonazepam  Chronic pain/opioid dependence -On Suboxone as an outpatient which has been continued.  Continue  gabapentin and as needed Norco  BPH -Continue tamsulosin  Obesity -Outpatient follow-up    DVT prophylaxis: Heparin subcutaneous Code Status: Full Family Communication: None at bedside Disposition Plan: Status is: Inpatient Remains inpatient appropriate because: Of severity of illness.  Consultants: Orthopedics  Procedures: None  Antimicrobials: Vancomycin, cefepime and Flagyl from 11/11/2022 onwards  Subjective: Patient seen and examined at bedside.  Denies fever, worsening shortness of breath, vomiting.  Complains of intermittent right foot pain. Objective: Vitals:   11/13/22 1418 11/13/22 1941 11/14/22 0455 11/14/22 0712  BP: 116/69 123/74 118/68 114/64  Pulse: 74 75 71 66  Resp: 16 18 17 17   Temp: 98 F (36.7 C) 98.1 F (36.7 C) 98.2 F (36.8 C) 98.1 F (36.7 C)  TempSrc: Oral Oral  Oral  SpO2: 96% 99% 91% 95%  Weight:      Height:        Intake/Output Summary (Last 24 hours) at 11/14/2022 0806 Last data filed at 11/14/2022 0509 Gross per 24 hour  Intake --  Output 200 ml  Net -200 ml    Filed Weights   11/12/22 0200  Weight: 99.6 kg    Examination:  General: On room air.  No distress.  Looks chronically ill and deconditioned ENT/neck: No thyromegaly.  JVD is not elevated  respiratory: Decreased breath sounds at bases bilaterally with some crackles; no wheezing  CVS: S1-S2 heard, rate controlled currently Abdominal: Soft, obese, nontender, slightly distended; no organomegaly, bowel sounds are heard Extremities: Trace lower extremity edema; no cyanosis.  Right foot dressing present CNS: Awake and alert.  Slow to respond.  Poor historian.  No focal neurologic deficit.  Moves extremities Lymph: No obvious lymphadenopathy Skin: No obvious ecchymosis/lesions  psych: Not agitated currently.  Affect is mostly flat.    Data Reviewed: I have personally reviewed following labs and imaging studies  CBC: Recent Labs  Lab 11/11/22 1800 11/12/22 0253  11/13/22 0106  WBC 13.1* 11.1* 8.4  NEUTROABS 9.6*  --  4.7  HGB 11.0* 9.2* 10.2*  HCT 35.5* 29.3* 32.1*  MCV 88.5 86.4 86.3  PLT 361 294 320    Basic Metabolic Panel: Recent Labs  Lab 11/11/22 1800 11/12/22 0253 11/13/22 0106 11/14/22 0323  NA 133* 131* 134* 136  K 4.2 3.8 3.8 4.2  CL 95* 97* 101 101  CO2 GLUCOSE 275* 187* 159* 133*  BUN CREATININE 1.60* 1.22 1.10 1.18  CALCIUM 9.4 8.5* 9.0 9.1  MG  --   --   --  1.6*    GFR: Estimated Creatinine Clearance: 73.5 mL/min (by C-G formula based on SCr of 1.18 mg/dL). Liver Function Tests: Recent Labs  Lab 11/11/22 1800  AST 21  ALT 13  ALKPHOS 113  BILITOT 0.5  PROT 8.9*  ALBUMIN 3.4*    No results for input(s): "LIPASE", "AMYLASE" in the last 168 hours. No results for input(s): "AMMONIA" in the last 168 hours. Coagulation Profile: Recent Labs  Lab 11/11/22 1800  INR 1.2    Cardiac Enzymes: No results for input(s): "CKTOTAL", "CKMB", "CKMBINDEX", "TROPONINI" in the last 168 hours. BNP (last 3 results) No results for input(s): "PROBNP" in the last 8760 hours. HbA1C: Recent Labs    11/12/22 0253  HGBA1C 7.0*    CBG: Recent Labs  Lab 11/13/22 0739 11/13/22 1118 11/13/22 1642 11/13/22 2143 11/14/22 0714  GLUCAP 208* 242* 183* 113* 181*    Lipid Profile: No results for input(s): "CHOL", "HDL", "LDLCALC", "TRIG", "CHOLHDL", "LDLDIRECT" in the last 72 hours. Thyroid Function Tests: No results for input(s): "TSH", "T4TOTAL", "FREET4", "T3FREE", "THYROIDAB" in the last 72 hours. Anemia Panel: No results for input(s): "VITAMINB12", "FOLATE", "FERRITIN", "TIBC", "IRON", "RETICCTPCT" in the last 72 hours. Sepsis Labs: Recent Labs  Lab 11/11/22 1800 11/12/22 0030  LATICACIDVEN 3.9* 1.9     Recent Results (from the past 240 hour(s))  Culture, blood (Routine x 2)     Status: None (Preliminary result)   Collection Time: 11/11/22  6:00 PM   Specimen: BLOOD LEFT ARM   Result Value Ref Range Status   Specimen Description BLOOD LEFT ARM  Final   Special Requests   Final    BOTTLES DRAWN AEROBIC AND ANAEROBIC Blood Culture adequate volume   Culture   Final    NO GROWTH 3 DAYS Performed at [facility], [address], [place], [place] [address]    Report Status PENDING  Incomplete  Culture, blood (Routine x 2)     Status: None (Preliminary result)   Collection Time: 11/11/22  9:40 PM   Specimen: BLOOD  Result Value Ref Range Status   Specimen Description BLOOD LEFT ANTECUBITAL  Final   Special Requests   Final    BOTTLES DRAWN AEROBIC AND ANAEROBIC Blood Culture adequate volume   Culture   Final    NO GROWTH 3 DAYS Performed at [facility], [address], [place], [place] [address]    Report Status PENDING  Incomplete  Resp panel by RT-PCR (RSV, Flu A&B, Covid) Anterior Nasal Swab     Status: None   Collection Time: 11/11/22  9:46 PM   Specimen: Anterior Nasal Swab  Result Value Ref Range Status   SARS Coronavirus 2 by RT PCR NEGATIVE NEGATIVE Final   Influenza A by PCR NEGATIVE NEGATIVE Final   Influenza B by PCR NEGATIVE NEGATIVE Final  Comment: (NOTE) The Xpert Xpress SARS-CoV-2/FLU/RSV plus assay is intended as an aid in the diagnosis of influenza from Nasopharyngeal swab specimens and should not be used as a sole basis for treatment. Nasal washings and aspirates are unacceptable for Xpert Xpress SARS-CoV-2/FLU/RSV testing.  Fact Sheet for Patients: BloggerCourse.com  Fact Sheet for Healthcare Providers: SeriousBroker.it  This test is not yet approved or cleared by the [place] FDA and has been authorized for detection and/or diagnosis of SARS-CoV-2 by FDA under an Emergency Use Authorization (EUA). This EUA will remain in effect (meaning this test can be used) for the duration of the COVID-19 declaration under Section 564(b)(1) of the Act, 21 U.S.C. section  360bbb-3(b)(1), unless the authorization is terminated or revoked.     Resp Syncytial Virus by PCR NEGATIVE NEGATIVE Final    Comment: (NOTE) Fact Sheet for Patients: BloggerCourse.com  Fact Sheet for Healthcare Providers: SeriousBroker.it  This test is not yet approved or cleared by the [place] FDA and has been authorized for detection and/or diagnosis of SARS-CoV-2 by FDA under an Emergency Use Authorization (EUA). This EUA will remain in effect (meaning this test can be used) for the duration of the COVID-19 declaration under Section 564(b)(1) of the Act, 21 U.S.C. section 360bbb-3(b)(1), unless the authorization is terminated or revoked.  Performed at [facility], [address], [place], [place] [address]          Radiology Studies: MR FOOT LEFT W WO CONTRAST  Result Date: 11/12/2022 CLINICAL DATA:  Soft tissue infection suspected, second toe. EXAM: MRI OF THE LEFT FOREFOOT WITHOUT AND WITH CONTRAST TECHNIQUE: Multiplanar, multisequence MR imaging of the left forefoot was performed both before and after administration of intravenous contrast. CONTRAST:  10mL GADAVIST GADOBUTROL 1 MMOL/ML IV SOLN COMPARISON:  None available FINDINGS: Bones/Joint/Cartilage There is high-grade marrow edema and enhancement of the distal phalanx of the second toe with more moderate edema and enhancement of the second toe middle phalanx. Possible mild erosion of the distal aspect of the distal phalanx of the second toe concerning for acute osteomyelitis. Question minimal increased fluid sensitive signal within the third through fifth distal phalanges, however this is favored to be secondary to inhomogeneous fat saturation and artifact. No surrounding soft tissue swelling or overlying soft tissue ulcers are seen to a secondary concern for acute osteomyelitis of these toes. There are moderate degenerative disc cystic changes in the distal  dorsal medial aspect of the great toe metatarsal head. Mild great toe metatarsophalangeal joint space narrowing and peripheral osteophytosis. Ligaments The Lisfranc ligament complex is intact. The metacarpophalangeal and interphalangeal collateral ligaments appear intact. The great toe sesamoid-phalangeal plantar plate ligaments are intact. Muscles and Tendons The flexor and extensor tendons are intact. Normal size and signal of the regional musculature. Soft tissues There is moderate edema and enhancement of a dorsal greater than plantar aspects of the second toe soft tissues. No definitive soft tissue ulceration is seen. No rim enhancing abscess. IMPRESSION: 1. There is moderate edema and enhancement of a dorsal greater than plantar aspects of the second toe soft tissues. No definitive soft tissue ulceration is seen. No rim enhancing abscess. 2. High-grade marrow edema and enhancement of the distal phalanx of the second toe with more moderate edema and enhancement of the second toe middle phalanx. Possible mild erosion of the distal aspect of the distal phalanx of the second toe concerning for acute osteomyelitis. Electronically Signed   By: Neita Garnet M.D.   On: 11/12/2022 19:52  Scheduled Meds:  aspirin  325 mg Oral Daily   buprenorphine-naloxone  1 tablet Sublingual Daily   buPROPion  100 mg Oral Daily   busPIRone  10 mg Oral BID   ferrous sulfate  325 mg Oral Q breakfast   gabapentin  300 mg Oral QID   heparin  5,000 Units Subcutaneous Q8H   insulin aspart  0-15 Units Subcutaneous TID WC   insulin aspart  0-5 Units Subcutaneous QHS   insulin aspart  10 Units Subcutaneous TID WC   insulin glargine-yfgn  50 Units Subcutaneous Daily   metoprolol succinate  25 mg Oral Daily   multivitamin with minerals  1 tablet Oral Daily   pantoprazole  40 mg Oral Daily   PARoxetine  40 mg Oral Daily   rosuvastatin  20 mg Oral QHS   tamsulosin  0.8 mg Oral Daily   Continuous Infusions:   ceFEPime (MAXIPIME) IV 2 g (11/13/22 2136)   vancomycin 1,250 mg (11/13/22 2251)          Glade Lloyd, MD [facility] 11/14/2022, 8:06 AM

## 2022-11-14 NOTE — Care Management Important Message (Signed)
Important Message  Patient Details  Name: Sylvanus Hullum MRN: [id number] Date of Birth: 1961-04-10   Medicare Important Message Given:  Yes     [name] 11/14/2022, 12:10 PM

## 2022-11-15 ENCOUNTER — Inpatient Hospital Stay (HOSPITAL_COMMUNITY): Payer: Medicare Other | Admitting: Certified Registered"

## 2022-11-15 ENCOUNTER — Encounter (HOSPITAL_COMMUNITY): Payer: Self-pay | Admitting: Internal Medicine

## 2022-11-15 ENCOUNTER — Other Ambulatory Visit: Payer: Self-pay

## 2022-11-15 ENCOUNTER — Encounter (HOSPITAL_COMMUNITY)
Admission: EM | Disposition: A | Discharge status: Home Health | Payer: Self-pay | Source: Home / Self Care | Attending: Internal Medicine

## 2022-11-15 DIAGNOSIS — Z7984 Long term (current) use of oral hypoglycemic drugs: Secondary | ICD-10-CM

## 2022-11-15 DIAGNOSIS — J45909 Unspecified asthma, uncomplicated: Secondary | ICD-10-CM

## 2022-11-15 DIAGNOSIS — E1152 Type 2 diabetes mellitus with diabetic peripheral angiopathy with gangrene: Secondary | ICD-10-CM

## 2022-11-15 DIAGNOSIS — F418 Other specified anxiety disorders: Secondary | ICD-10-CM

## 2022-11-15 DIAGNOSIS — I96 Gangrene, not elsewhere classified: Secondary | ICD-10-CM | POA: Diagnosis not present

## 2022-11-15 HISTORY — PX: AMPUTATION: SHX166

## 2022-11-15 LAB — GLUCOSE, CAPILLARY
Glucose-Capillary: 169 mg/dL — ABNORMAL HIGH (ref 70–99)
Glucose-Capillary: 161 mg/dL — ABNORMAL HIGH (ref 70–99)
Glucose-Capillary: 143 mg/dL — ABNORMAL HIGH (ref 70–99)
Glucose-Capillary: 132 mg/dL — ABNORMAL HIGH (ref 70–99)
Glucose-Capillary: 178 mg/dL — ABNORMAL HIGH (ref 70–99)
Glucose-Capillary: 133 mg/dL — ABNORMAL HIGH (ref 70–99)

## 2022-11-15 LAB — BASIC METABOLIC PANEL
Anion gap: 8 (ref 5–15)
BUN: 19 mg/dL (ref 6–20)
CO2: 25 mmol/L (ref 22–32)
Calcium: 8.8 mg/dL — ABNORMAL LOW (ref 8.9–10.3)
Chloride: 100 mmol/L (ref 98–111)
Creatinine, Ser: 1.13 mg/dL (ref 0.61–1.24)
GFR, Estimated: >60 mL/min (ref >60)
Glucose, Bld: 163 mg/dL — ABNORMAL HIGH (ref 70–99)
Potassium: 3.9 mmol/L (ref 3.5–5.1)
Sodium: 133 mmol/L — ABNORMAL LOW (ref 135–145)

## 2022-11-15 LAB — CULTURE, BLOOD (ROUTINE X 2)
Culture: NO GROWTH
Special Requests: ADEQUATE

## 2022-11-15 LAB — MAGNESIUM: Magnesium: 1.8 mg/dL (ref 1.7–2.4)

## 2022-11-15 SURGERY — AMPUTATION, FOOT, PARTIAL
Anesthesia: General | Site: Foot | Laterality: Right

## 2022-11-15 MED ORDER — FENTANYL CITRATE (PF) 250 MCG/5ML IJ SOLN
INTRAMUSCULAR | Status: AC
  Filled 2022-11-15: qty 5

## 2022-11-15 MED ORDER — OXYCODONE HCL 5 MG PO TABS: 5.0000 mg | ORAL_TABLET | Freq: Once | ORAL | Status: DC | PRN

## 2022-11-15 MED ORDER — PROPOFOL 10 MG/ML IV BOLUS
INTRAVENOUS | Status: DC | PRN
  Administered 2022-11-15: 150 mg via INTRAVENOUS

## 2022-11-15 MED ORDER — JUVEN PO PACK
1.0000 | PACK | Freq: Two times a day (BID) | ORAL | Status: DC
  Administered 2022-11-15 – 2022-11-16 (×3): 1 via ORAL
  Filled 2022-11-15 (×3): qty 1

## 2022-11-15 MED ORDER — LACTATED RINGERS IV SOLN: INTRAVENOUS | Status: DC

## 2022-11-15 MED ORDER — HYDRALAZINE HCL 20 MG/ML IJ SOLN: 5.0000 mg | INTRAMUSCULAR | Status: DC | PRN

## 2022-11-15 MED ORDER — MAGNESIUM CITRATE PO SOLN: 1.0000 | Freq: Once | ORAL | Status: DC | PRN

## 2022-11-15 MED ORDER — SUCCINYLCHOLINE CHLORIDE 200 MG/10ML IV SOSY
PREFILLED_SYRINGE | INTRAVENOUS | Status: DC | PRN
  Administered 2022-11-15: 120 mg via INTRAVENOUS

## 2022-11-15 MED ORDER — PROMETHAZINE HCL 25 MG/ML IJ SOLN: 6.2500 mg | INTRAMUSCULAR | Status: DC | PRN

## 2022-11-15 MED ORDER — CHLORHEXIDINE GLUCONATE 0.12 % MT SOLN
OROMUCOSAL | Status: AC
  Administered 2022-11-15: 15 mL via OROMUCOSAL
  Filled 2022-11-15: qty 15

## 2022-11-15 MED ORDER — LABETALOL HCL 5 MG/ML IV SOLN: 10.0000 mg | INTRAVENOUS | Status: DC | PRN

## 2022-11-15 MED ORDER — ACETAMINOPHEN 325 MG PO TABS: 325.0000 mg | ORAL_TABLET | Freq: Four times a day (QID) | ORAL | Status: DC | PRN

## 2022-11-15 MED ORDER — LIDOCAINE 2% (20 MG/ML) 5 ML SYRINGE
INTRAMUSCULAR | Status: DC | PRN
  Administered 2022-11-15: 100 mg via INTRAVENOUS

## 2022-11-15 MED ORDER — PROPOFOL 10 MG/ML IV BOLUS
INTRAVENOUS | Status: AC
  Filled 2022-11-15: qty 20

## 2022-11-15 MED ORDER — OXYCODONE HCL 5 MG PO TABS: 5.0000 mg | ORAL_TABLET | ORAL | Status: DC | PRN

## 2022-11-15 MED ORDER — POTASSIUM CHLORIDE CRYS ER 20 MEQ PO TBCR: 20.0000 meq | EXTENDED_RELEASE_TABLET | Freq: Every day | ORAL | Status: DC | PRN

## 2022-11-15 MED ORDER — PANTOPRAZOLE SODIUM 40 MG PO TBEC
40.0000 mg | DELAYED_RELEASE_TABLET | Freq: Every day | ORAL | Status: DC
  Administered 2022-11-15 – 2022-11-16 (×2): 40 mg via ORAL
  Filled 2022-11-15 (×2): qty 1

## 2022-11-15 MED ORDER — DOCUSATE SODIUM 100 MG PO CAPS
100.0000 mg | ORAL_CAPSULE | Freq: Every day | ORAL | Status: DC
  Administered 2022-11-16: 100 mg via ORAL
  Filled 2022-11-15: qty 1

## 2022-11-15 MED ORDER — MAGNESIUM SULFATE 2 GM/50ML IV SOLN: 2.0000 g | Freq: Every day | INTRAVENOUS | Status: DC | PRN

## 2022-11-15 MED ORDER — HYDROMORPHONE HCL 1 MG/ML IJ SOLN: 0.2500 mg | INTRAMUSCULAR | Status: DC | PRN

## 2022-11-15 MED ORDER — METOPROLOL TARTRATE 5 MG/5ML IV SOLN: 2.0000 mg | INTRAVENOUS | Status: DC | PRN

## 2022-11-15 MED ORDER — ALUM & MAG HYDROXIDE-SIMETH 200-200-20 MG/5ML PO SUSP: 15.0000 mL | ORAL | Status: DC | PRN

## 2022-11-15 MED ORDER — PHENOL 1.4 % MT LIQD: 1.0000 | OROMUCOSAL | Status: DC | PRN

## 2022-11-15 MED ORDER — SODIUM CHLORIDE 0.9 % IV SOLN: INTRAVENOUS | Status: DC

## 2022-11-15 MED ORDER — CEFAZOLIN SODIUM-DEXTROSE 2-4 GM/100ML-% IV SOLN
2.0000 g | Freq: Three times a day (TID) | INTRAVENOUS | Status: DC
  Filled 2022-11-15: qty 100

## 2022-11-15 MED ORDER — POLYETHYLENE GLYCOL 3350 17 G PO PACK: 17.0000 g | PACK | Freq: Every day | ORAL | Status: DC | PRN

## 2022-11-15 MED ORDER — AMISULPRIDE (ANTIEMETIC) 5 MG/2ML IV SOLN: 10.0000 mg | Freq: Once | INTRAVENOUS | Status: DC | PRN

## 2022-11-15 MED ORDER — CHLORHEXIDINE GLUCONATE 0.12 % MT SOLN: 15.0000 mL | Freq: Once | OROMUCOSAL | Status: AC

## 2022-11-15 MED ORDER — CEFAZOLIN SODIUM-DEXTROSE 2-4 GM/100ML-% IV SOLN
INTRAVENOUS | Status: AC
  Filled 2022-11-15: qty 100

## 2022-11-15 MED ORDER — OXYCODONE HCL 5 MG/5ML PO SOLN: 5.0000 mg | Freq: Once | ORAL | Status: DC | PRN

## 2022-11-15 MED ORDER — ORAL CARE MOUTH RINSE: 15.0000 mL | Freq: Once | OROMUCOSAL | Status: AC

## 2022-11-15 MED ORDER — HYDROMORPHONE HCL 1 MG/ML IJ SOLN
0.5000 mg | INTRAMUSCULAR | Status: DC | PRN
  Administered 2022-11-15 – 2022-11-16 (×2): 1 mg via INTRAVENOUS
  Filled 2022-11-15 (×2): qty 1

## 2022-11-15 MED ORDER — GUAIFENESIN-DM 100-10 MG/5ML PO SYRP: 15.0000 mL | ORAL_SOLUTION | ORAL | Status: DC | PRN

## 2022-11-15 MED ORDER — OXYCODONE HCL 5 MG PO TABS
10.0000 mg | ORAL_TABLET | ORAL | Status: DC | PRN
  Administered 2022-11-16 (×2): 15 mg via ORAL
  Administered 2022-11-16: 10 mg via ORAL
  Filled 2022-11-15: qty 2
  Filled 2022-11-15 (×2): qty 3

## 2022-11-15 MED ORDER — BISACODYL 5 MG PO TBEC: 5.0000 mg | DELAYED_RELEASE_TABLET | Freq: Every day | ORAL | Status: DC | PRN

## 2022-11-15 MED ORDER — FENTANYL CITRATE (PF) 250 MCG/5ML IJ SOLN
INTRAMUSCULAR | Status: DC | PRN
  Administered 2022-11-15: 150 ug via INTRAVENOUS

## 2022-11-15 MED ORDER — ONDANSETRON HCL 4 MG/2ML IJ SOLN
INTRAMUSCULAR | Status: DC | PRN
  Administered 2022-11-15: 4 mg via INTRAVENOUS

## 2022-11-15 MED ORDER — INSULIN ASPART 100 UNIT/ML IJ SOLN
0.0000 [IU] | INTRAMUSCULAR | Status: DC | PRN
  Administered 2022-11-15: 2 [IU] via SUBCUTANEOUS

## 2022-11-15 MED ORDER — ONDANSETRON HCL 4 MG/2ML IJ SOLN: 4.0000 mg | Freq: Four times a day (QID) | INTRAMUSCULAR | Status: DC | PRN

## 2022-11-15 MED ORDER — ZINC SULFATE 220 (50 ZN) MG PO CAPS
220.0000 mg | ORAL_CAPSULE | Freq: Every day | ORAL | Status: DC
  Administered 2022-11-15 – 2022-11-16 (×2): 220 mg via ORAL
  Filled 2022-11-15 (×2): qty 1

## 2022-11-15 MED ORDER — VITAMIN C 500 MG PO TABS
1000.0000 mg | ORAL_TABLET | Freq: Every day | ORAL | Status: DC
  Administered 2022-11-15 – 2022-11-16 (×2): 1000 mg via ORAL
  Filled 2022-11-15 (×2): qty 2

## 2022-11-15 MED ORDER — INSULIN ASPART 100 UNIT/ML IJ SOLN
INTRAMUSCULAR | Status: AC
  Filled 2022-11-15: qty 1

## 2022-11-15 SURGICAL SUPPLY — 40 items
APL SKNCLS STERI-STRIP NONHPOA (GAUZE/BANDAGES/DRESSINGS) ×1
BAG COUNTER SPONGE SURGICOUNT (BAG) ×1 IMPLANT
BAG SPNG CNTER NS LX DISP (BAG) ×1
BENZOIN TINCTURE PRP APPL 2/3 (GAUZE/BANDAGES/DRESSINGS) ×1 IMPLANT
BLADE SAW SGTL HD 18.5X60.5X1. (BLADE) ×1 IMPLANT
BLADE SURG 21 STRL SS (BLADE) ×1 IMPLANT
BNDG COHESIVE 4X5 TAN STRL (GAUZE/BANDAGES/DRESSINGS) IMPLANT
BNDG GAUZE DERMACEA FLUFF 4 (GAUZE/BANDAGES/DRESSINGS) IMPLANT
BNDG GZE DERMACEA 4 6PLY (GAUZE/BANDAGES/DRESSINGS)
CANISTER WOUND CARE 500ML ATS (WOUND CARE) IMPLANT
COVER SURGICAL LIGHT HANDLE (MISCELLANEOUS) ×1 IMPLANT
DRAPE DERMATAC (DRAPES) IMPLANT
DRAPE INCISE IOBAN 66X45 STRL (DRAPES) ×1 IMPLANT
DRAPE U-SHAPE 47X51 STRL (DRAPES) ×1 IMPLANT
DRESSING PEEL AND PLC PRVNA 13 (GAUZE/BANDAGES/DRESSINGS) IMPLANT
DRSG ADAPTIC 3X8 NADH LF (GAUZE/BANDAGES/DRESSINGS) IMPLANT
DRSG PEEL AND PLACE PREVENA 13 (GAUZE/BANDAGES/DRESSINGS) ×1
DURAPREP 26ML APPLICATOR (WOUND CARE) ×1 IMPLANT
ELECT REM PT RETURN 9FT ADLT (ELECTROSURGICAL) ×1
ELECTRODE REM PT RTRN 9FT ADLT (ELECTROSURGICAL) ×1 IMPLANT
GAUZE PAD ABD 8X10 STRL (GAUZE/BANDAGES/DRESSINGS) IMPLANT
GAUZE SPONGE 4X4 12PLY STRL (GAUZE/BANDAGES/DRESSINGS) IMPLANT
GLOVE BIOGEL PI IND STRL 9 (GLOVE) ×1 IMPLANT
GLOVE SURG ORTHO 9.0 STRL STRW (GLOVE) ×1 IMPLANT
GOWN STRL REUS W/ TWL XL LVL3 (GOWN DISPOSABLE) ×3 IMPLANT
GOWN STRL REUS W/TWL XL LVL3 (GOWN DISPOSABLE) ×3
GRAFT SKIN WND MICRO 38 (Tissue) IMPLANT
KIT BASIN OR (CUSTOM PROCEDURE TRAY) ×1 IMPLANT
KIT TURNOVER KIT B (KITS) ×1 IMPLANT
Kerecis graft skin ×1
NS IRRIG 1000ML POUR BTL (IV SOLUTION) ×1 IMPLANT
PACK ORTHO EXTREMITY (CUSTOM PROCEDURE TRAY) ×1 IMPLANT
PAD ARMBOARD 7.5X6 YLW CONV (MISCELLANEOUS) ×2 IMPLANT
SPONGE T-LAP 18X18 ~~LOC~~+RFID (SPONGE) IMPLANT
SUT ETHILON 2 0 PSLX (SUTURE) ×2 IMPLANT
TOWEL GREEN STERILE (TOWEL DISPOSABLE) ×1 IMPLANT
TOWEL GREEN STERILE FF (TOWEL DISPOSABLE) ×1 IMPLANT
TUBE CONNECTING 12X1/4 (SUCTIONS) ×1 IMPLANT
WATER STERILE IRR 1000ML POUR (IV SOLUTION) ×1 IMPLANT
YANKAUER SUCT BULB TIP NO VENT (SUCTIONS) ×1 IMPLANT

## 2022-11-15 NOTE — Anesthesia Preprocedure Evaluation (Addendum)
Anesthesia Evaluation  Patient identified by MRN, date of birth, ID band Patient awake    Reviewed: Allergy & Precautions, NPO status , Patient's Chart, lab work & pertinent test results  Airway Mallampati: III  TM Distance: >3 FB Neck ROM: Full    Dental no notable dental hx. (+) Teeth Intact, Dental Advisory Given   Pulmonary asthma    Pulmonary exam normal breath sounds clear to auscultation       Cardiovascular hypertension, Pt. on medications and Pt. on home beta blockers + angina  + CAD, + Past MI and + Peripheral Vascular Disease  Normal cardiovascular exam Rhythm:Regular Rate:Normal     Neuro/Psych   Anxiety Depression       GI/Hepatic Neg liver ROS,GERD  Medicated and Controlled,,  Endo/Other  diabetes, Type 2, Oral Hypoglycemic Agents  Morbid obesity  Renal/GU negative Renal ROS     Musculoskeletal  (+) Arthritis , Osteoarthritis,    Abdominal  (+) + obese  Peds  Hematology 13/40   Anesthesia Other Findings   Reproductive/Obstetrics                             Anesthesia Physical Anesthesia Plan  ASA: III and emergent  Anesthesia Plan: General   Post-op Pain Management:  Regional for Post-op pain   Induction: Intravenous, Rapid sequence and Cricoid pressure planned  PONV Risk Score and Plan: 2 and Ondansetron, Midazolam and Treatment may vary due to age or medical condition  Airway Management Planned: Oral ETT  Additional Equipment:   Intra-op Plan:   Post-operative Plan: Extubation in OR  Informed Consent: I have reviewed the patients History and Physical, chart, labs and discussed the procedure including the risks, benefits and alternatives for the proposed anesthesia with the patient or authorized representative who has indicated his/her understanding and acceptance.       Plan Discussed with: CRNA and Surgeon  Anesthesia Plan Comments:          Anesthesia Quick Evaluation

## 2022-11-15 NOTE — Anesthesia Postprocedure Evaluation (Signed)
Anesthesia Post Note  Patient: Kyle [name]  Procedure(s) Performed: RIGHT TRANSMETATARSAL AMPUTATION (Right: Foot)     Patient location during evaluation: PACU Anesthesia Type: General Level of consciousness: awake and alert Pain management: pain level controlled Vital Signs Assessment: post-procedure vital signs reviewed and stable Respiratory status: spontaneous breathing, nonlabored ventilation and respiratory function stable Cardiovascular status: blood pressure returned to baseline and stable Postop Assessment: no apparent nausea or vomiting Anesthetic complications: no   No notable events documented.  Last Vitals:  Vitals:   11/15/22 1325 11/15/22 1330  BP: (!) 140/74 131/74  Pulse: 69 72  Resp: 10 10  Temp: 36.7 C   SpO2: 99% 95%    Last Pain:  Vitals:   11/15/22 1325  TempSrc:   PainSc: 3                  Lowella Curb

## 2022-11-15 NOTE — Interval H&P Note (Signed)
History and Physical Interval Note:  11/15/2022 6:46 AM  Kyle [name]  has presented today for surgery, with the diagnosis of Gangrene Right Toes.  The various methods of treatment have been discussed with the patient and family. After consideration of risks, benefits and other options for treatment, the patient has consented to  Procedure(s): RIGHT TRANSMETATARSAL AMPUTATION (Right) as a surgical intervention.  The patient's history has been reviewed, patient examined, no change in status, stable for surgery.  I have reviewed the patient's chart and labs.  Questions were answered to the patient's satisfaction.     Nadara Mustard

## 2022-11-15 NOTE — Transfer of Care (Signed)
Immediate Anesthesia Transfer of Care Note  Patient: Kyle [name]  Procedure(s) Performed: RIGHT TRANSMETATARSAL AMPUTATION (Right: Foot)  Patient Location: PACU  Anesthesia Type:General  Level of Consciousness: awake, alert , and oriented  Airway & Oxygen Therapy: Patient Spontanous Breathing and Patient connected to nasal cannula oxygen  Post-op Assessment: Report given to RN and Post -op Vital signs reviewed and stable  Post vital signs: Reviewed and stable  Last Vitals:  Vitals Value Taken Time  BP    Temp    Pulse 72 11/15/22 1259  Resp 14 11/15/22 1259  SpO2 89 % 11/15/22 1259  Vitals shown include unvalidated device data.  Last Pain:  Vitals:   11/15/22 1137  TempSrc: Oral  PainSc: 0-No pain      Patients Stated Pain Goal: 0 (11/12/22 0444)  Complications: No notable events documented.

## 2022-11-15 NOTE — Progress Notes (Signed)
PROGRESS NOTE    Khairi Garman  QIO:[id number] DOB: Mar 19, 1961 DOA: 11/11/2022 PCP: [name], MD   Brief Narrative:  62 year old gentleman with history of coronary artery disease, insulin-dependent type 2 diabetes, hypertension, hyperlipidemia, anemia, psoriasis, chronic right foot ulcer presented from orthopedics office because of concern for foot infection.  He was admitted for osteomyelitis of left foot with necrotic changes.  He was started on broad-spectrum antibiotics and orthopedics was consulted.  Assessment & Plan:   Acute osteomyelitis of right foot/dry gangrene/ulcer of toes of right foot with superimposed cellulitis Severe sepsis: Present on admission, from above Lactic acidosis: Present on admission -Presented with tachycardia, leukocytosis, lactic acidosis with evidence of acute osteomyelitis. -MRI with evidence of osteomyelitis of the toes.  Currently on broad-spectrum antibiotics.  Blood cultures negative so far -Orthopedics following and planning for right transmetatarsal amputation today.  Acute kidney injury -Creatinine 1.6 on presentation.  Resolved and creatinine 1.13 today.  Treated with IV fluids and subsequently discontinued.  Diabetes mellitus type 2 uncontrolled with hyperglycemia -Metformin on hold.  Continue CBGs with SSI along with NovoLog with meals and long-acting insulin.  A1c 7.  Carb modified diet.  Leukocytosis -Resolved  Hyponatremia -Mild.  Monitor intermittently  Hypomagnesemia -Improved  Anemia of chronic disease -From chronic illnesses.  Hemoglobin stable.  Hypertension Hyperlipidemia Coronary artery disease -Continue aspirin, statin and metoprolol succinate.  Blood pressure currently stable.  Outpatient follow-up with PCP/cardiology  Depression/anxiety -Continue paroxetine, bupropion, buspirone and as needed clonazepam  Chronic pain/opioid dependence -On Suboxone as an outpatient which has been continued.  Continue gabapentin  and as needed Norco  BPH -Continue tamsulosin  Obesity -Outpatient follow-up    DVT prophylaxis: Heparin subcutaneous Code Status: Full Family Communication: Family members at bedside Disposition Plan: Status is: Inpatient Remains inpatient appropriate because: Of severity of illness.  Consultants: Orthopedics  Procedures: None  Antimicrobials: Vancomycin, cefepime and Flagyl from 11/11/2022 onwards  Subjective: Patient seen and examined at bedside.  No chest pain or worsening shortness of breath or fever reported.   Objective: Vitals:   11/14/22 0712 11/14/22 1331 11/14/22 2031 11/15/22 0725  BP: 114/64 103/66 (!) 140/82 130/68  Pulse: 66 73 84 74  Resp: Temp: 98.1 F (36.7 C) 98.3 F (36.8 C) 98.2 F (36.8 C) 97.8 F (36.6 C)  TempSrc: Oral Oral Oral Oral  SpO2: 95% 95% 97% 94%  Weight:      Height:        Intake/Output Summary (Last 24 hours) at 11/15/2022 0751 Last data filed at 11/14/2022 1500 Gross per 24 hour  Intake 1050 ml  Output --  Net 1050 ml    Filed Weights   11/12/22 0200  Weight: 99.6 kg    Examination:  General: No acute distress.  Still on room air. respiratory: Bilateral decreased breath sounds at bases with scattered crackles CVS: S1 and S2 are heard; rate mostly controlled abdominal: Soft, nontender, distended mildly, no organomegaly; bowel sounds are heard normally extremities: Mild lower extremity edema; no clubbing.  Right foot has a dressing     Data Reviewed: I have personally reviewed following labs and imaging studies  CBC: Recent Labs  Lab 11/11/22 1800 11/12/22 0253 11/13/22 0106  WBC 13.1* 11.1* 8.4  NEUTROABS 9.6*  --  4.7  HGB 11.0* 9.2* 10.2*  HCT 35.5* 29.3* 32.1*  MCV 88.5 86.4 86.3  PLT 361 294 320    Basic Metabolic Panel: Recent Labs  Lab 11/11/22 1800 11/12/22 0253 11/13/22  0106 11/14/22 0323 11/15/22 0244  NA 133* 131* 134* 136 133*  K 4.2 3.8 3.8 4.2 3.9  CL 95* 97* 101 101  100  CO2 GLUCOSE 275* 187* 159* 133* 163*  BUN CREATININE 1.60* 1.22 1.10 1.18 1.13  CALCIUM 9.4 8.5* 9.0 9.1 8.8*  MG  --   --   --  1.6* 1.8    GFR: Estimated Creatinine Clearance: 76.8 mL/min (by C-G formula based on SCr of 1.13 mg/dL). Liver Function Tests: Recent Labs  Lab 11/11/22 1800  AST 21  ALT 13  ALKPHOS 113  BILITOT 0.5  PROT 8.9*  ALBUMIN 3.4*    No results for input(s): "LIPASE", "AMYLASE" in the last 168 hours. No results for input(s): "AMMONIA" in the last 168 hours. Coagulation Profile: Recent Labs  Lab 11/11/22 1800  INR 1.2    Cardiac Enzymes: No results for input(s): "CKTOTAL", "CKMB", "CKMBINDEX", "TROPONINI" in the last 168 hours. BNP (last 3 results) No results for input(s): "PROBNP" in the last 8760 hours. HbA1C: No results for input(s): "HGBA1C" in the last 72 hours.  CBG: Recent Labs  Lab 11/14/22 0714 11/14/22 1132 11/14/22 1651 11/14/22 2031 11/15/22 0722  GLUCAP 181* 204* 170* 142* 169*    Lipid Profile: No results for input(s): "CHOL", "HDL", "LDLCALC", "TRIG", "CHOLHDL", "LDLDIRECT" in the last 72 hours. Thyroid Function Tests: No results for input(s): "TSH", "T4TOTAL", "FREET4", "T3FREE", "THYROIDAB" in the last 72 hours. Anemia Panel: No results for input(s): "VITAMINB12", "FOLATE", "FERRITIN", "TIBC", "IRON", "RETICCTPCT" in the last 72 hours. Sepsis Labs: Recent Labs  Lab 11/11/22 1800 11/12/22 0030  LATICACIDVEN 3.9* 1.9     Recent Results (from the past 240 hour(s))  Culture, blood (Routine x 2)     Status: None (Preliminary result)   Collection Time: 11/11/22  6:00 PM   Specimen: BLOOD LEFT ARM  Result Value Ref Range Status   Specimen Description BLOOD LEFT ARM  Final   Special Requests   Final    BOTTLES DRAWN AEROBIC AND ANAEROBIC Blood Culture adequate volume   Culture   Final    NO GROWTH 4 DAYS Performed at [facility], [address], [place], [place]  [address]    Report Status PENDING  Incomplete  Culture, blood (Routine x 2)     Status: None (Preliminary result)   Collection Time: 11/11/22  9:40 PM   Specimen: BLOOD  Result Value Ref Range Status   Specimen Description BLOOD LEFT ANTECUBITAL  Final   Special Requests   Final    BOTTLES DRAWN AEROBIC AND ANAEROBIC Blood Culture adequate volume   Culture   Final    NO GROWTH 4 DAYS Performed at [facility], [address] [place], [place] [address]    Report Status PENDING  Incomplete  Resp panel by RT-PCR (RSV, Flu A&B, Covid) Anterior Nasal Swab     Status: None   Collection Time: 11/11/22  9:46 PM   Specimen: Anterior Nasal Swab  Result Value Ref Range Status   SARS Coronavirus 2 by RT PCR NEGATIVE NEGATIVE Final   Influenza A by PCR NEGATIVE NEGATIVE Final   Influenza B by PCR NEGATIVE NEGATIVE Final    Comment: (NOTE) The Xpert Xpress SARS-CoV-2/FLU/RSV plus assay is intended as an aid in the diagnosis of influenza from Nasopharyngeal swab specimens and should not be used as a sole basis for treatment. Nasal washings and aspirates are unacceptable  for Xpert Xpress SARS-CoV-2/FLU/RSV testing.  Fact Sheet for Patients: BloggerCourse.com  Fact Sheet for Healthcare Providers: SeriousBroker.it  This test is not yet approved or cleared by the [place] FDA and has been authorized for detection and/or diagnosis of SARS-CoV-2 by FDA under an Emergency Use Authorization (EUA). This EUA will remain in effect (meaning this test can be used) for the duration of the COVID-19 declaration under Section 564(b)(1) of the Act, 21 U.S.C. section 360bbb-3(b)(1), unless the authorization is terminated or revoked.     Resp Syncytial Virus by PCR NEGATIVE NEGATIVE Final    Comment: (NOTE) Fact Sheet for Patients: BloggerCourse.com  Fact Sheet for Healthcare  Providers: SeriousBroker.it  This test is not yet approved or cleared by the [place] FDA and has been authorized for detection and/or diagnosis of SARS-CoV-2 by FDA under an Emergency Use Authorization (EUA). This EUA will remain in effect (meaning this test can be used) for the duration of the COVID-19 declaration under Section 564(b)(1) of the Act, 21 U.S.C. section 360bbb-3(b)(1), unless the authorization is terminated or revoked.  Performed at [facility], [address] [place], [place] [address]   MRSA Next Gen by PCR, Nasal     Status: None   Collection Time: 11/14/22  6:02 PM   Specimen: Nasal Mucosa; Nasal Swab  Result Value Ref Range Status   MRSA by PCR Next Gen NOT DETECTED NOT DETECTED Final    Comment: (NOTE) The GeneXpert MRSA Assay (FDA approved for NASAL specimens only), is one component of a comprehensive MRSA colonization surveillance program. It is not intended to diagnose MRSA infection nor to guide or monitor treatment for MRSA infections. Test performance is not FDA approved in patients less than 39 years old. Performed at [facility], [address], [place], [place] [address]          Radiology Studies: No results found.      Scheduled Meds:  aspirin  325 mg Oral Daily   buprenorphine-naloxone  1 tablet Sublingual Daily   buPROPion  100 mg Oral Daily   busPIRone  10 mg Oral BID   ferrous sulfate  325 mg Oral Q breakfast   gabapentin  300 mg Oral QID   heparin  5,000 Units Subcutaneous Q8H   insulin aspart  0-15 Units Subcutaneous TID WC   insulin aspart  0-5 Units Subcutaneous QHS   insulin aspart  10 Units Subcutaneous TID WC   insulin glargine-yfgn  50 Units Subcutaneous Daily   metoprolol succinate  25 mg Oral Daily   multivitamin with minerals  1 tablet Oral Daily   pantoprazole  40 mg Oral Daily   PARoxetine  40 mg Oral Daily   povidone-iodine  2 Application Topical Once    rosuvastatin  20 mg Oral QHS   tamsulosin  0.8 mg Oral Daily   Continuous Infusions:   ceFAZolin (ANCEF) IV     ceFEPime (MAXIPIME) IV 2 g (11/14/22 2133)   vancomycin 1,250 mg (11/14/22 2248)          Glade Lloyd, MD [facility] 11/15/2022, 7:51 AM

## 2022-11-15 NOTE — Plan of Care (Signed)
  Problem: Education: Goal: Knowledge of General Education information will improve Description Including pain rating scale, medication(s)/side effects and non-pharmacologic comfort measures Outcome: Progressing   Problem: Clinical Measurements: Goal: Will remain free from infection Outcome: Progressing   Problem: Activity: Goal: Risk for activity intolerance will decrease Outcome: Progressing   Problem: Coping: Goal: Level of anxiety will decrease Outcome: Progressing   Problem: Pain Managment: Goal: General experience of comfort will improve Outcome: Progressing   Problem: Safety: Goal: Ability to remain free from injury will improve Outcome: Progressing   

## 2022-11-15 NOTE — Anesthesia Procedure Notes (Signed)
Procedure Name: Intubation Date/Time: 11/15/2022 12:21 PM  Performed by: Sheppard Evens, CRNAPre-anesthesia Checklist: Patient identified, Emergency Drugs available, Suction available and Patient being monitored Patient Re-evaluated:Patient Re-evaluated prior to induction Oxygen Delivery Method: Circle System Utilized Preoxygenation: Pre-oxygenation with 100% oxygen Induction Type: Rapid sequence and Cricoid Pressure applied Laryngoscope Size: Mac and 3 Grade View: Grade I Tube type: Oral Tube size: 7.5 mm Number of attempts: 1 Airway Equipment and Method: Stylet and Oral airway Placement Confirmation: ETT inserted through vocal cords under direct vision, positive ETCO2 and breath sounds checked- equal and bilateral Secured at: 22 cm Tube secured with: Tape Dental Injury: Teeth and Oropharynx as per pre-operative assessment

## 2022-11-15 NOTE — Op Note (Signed)
11/15/2022  1:11 PM  PATIENT:  Kyle [name]    PRE-OPERATIVE DIAGNOSIS:  Gangrene Right Toes  POST-OPERATIVE DIAGNOSIS:  Same  PROCEDURE:  RIGHT TRANSMETATARSAL AMPUTATION Application Kerecis micro graft 38 cm. Application Prevena wound VAC 13 cm.  SURGEON:  Nadara Mustard, MD  PHYSICIAN ASSISTANT:None ANESTHESIA:   General  PREOPERATIVE INDICATIONS:  Kyle [name] is a  62 y.o. male with a diagnosis of Gangrene Right Toes who failed conservative measures and elected for surgical management.    The risks benefits and alternatives were discussed with the patient preoperatively including but not limited to the risks of infection, bleeding, nerve injury, cardiopulmonary complications, the need for revision surgery, among others, and the patient was willing to proceed.  OPERATIVE IMPLANTS:   * No implants in log *  @  OPERATIVE FINDINGS: Patient had good muscle color contractility and consistency.  Tissue margins were clear.  Patient did have calcified vessels.  OPERATIVE PROCEDURE: Patient brought the operating room and underwent a general anesthetic.  After adequate levels anesthesia were obtained patient's right lower extremity was prepped using DuraPrep draped into a sterile field a timeout was called.  A fishmouth incision was made just proximal to the gangrenous wound edges.  This was carried down to bone and an oscillating saw was used to perform a transmetatarsal amputation.  Electrocautery was used hemostasis the wound was irrigated with normal saline.  The muscle had good color and contractility.  There was calcification of the arteries.  The wound was covered with 38 cm of Kerecis micro graft.  The wound was closed using 2-0 nylon.  A Prevena 13 cm wound VAC was applied this had a good suction fit.  Patient was extubated taken the PACU in stable condition.   DISCHARGE PLANNING:  Antibiotic duration: Continue antibiotics for 24 hours  Weightbearing: Touchdown  weightbearing on the right ideally nonweightbearing on the right  Pain medication: Opioid pathway  Dressing care/ Wound VAC: Wound VAC plan for discharge with the Praveena plus portable wound VAC pump  Ambulatory devices: Walker recommend patient try to get a kneeling scooter  Discharge to: Home.  Follow-up: In the office 1 week post operative.

## 2022-11-16 LAB — CULTURE, BLOOD (ROUTINE X 2)
Culture: NO GROWTH
Special Requests: ADEQUATE

## 2022-11-16 LAB — GLUCOSE, CAPILLARY
Glucose-Capillary: 150 mg/dL — ABNORMAL HIGH (ref 70–99)
Glucose-Capillary: 239 mg/dL — ABNORMAL HIGH (ref 70–99)
Glucose-Capillary: 156 mg/dL — ABNORMAL HIGH (ref 70–99)

## 2022-11-16 MED ORDER — HYDROCODONE-ACETAMINOPHEN 5-325 MG PO TABS: 1.0000 | ORAL_TABLET | Freq: Four times a day (QID) | ORAL | 0 refills | Status: DC | PRN

## 2022-11-16 MED ORDER — POLYETHYLENE GLYCOL 3350 17 G PO PACK: 17.0000 g | PACK | Freq: Every day | ORAL | 0 refills | Status: DC | PRN

## 2022-11-16 NOTE — Discharge Summary (Addendum)
Physician Discharge Summary  Kyle [name] ZOX:[id number] DOB: [birth year] DOA: 11/11/2022  PCP: [name], MD  Admit date: 11/11/2022 Discharge date: 11/16/2022  Admitted From: Home Disposition: Home  Recommendations for Outpatient Follow-up:  Follow up with PCP in 1 week with repeat CBC/BMP Outpatient follow up with Orthopedics/Dr. Lajoyce Corners. Discharge wound care/pain management as per Dr. Lajoyce Corners Follow up in ED if symptoms worsen or new appear   Home Health: Home health PT Equipment/Devices: None  Discharge Condition: Stable CODE STATUS: Full Diet recommendation: Heart healthy/Carb modified  Brief/Interim Summary: 62 year old gentleman with history of coronary artery disease, insulin-dependent type 2 diabetes, hypertension, hyperlipidemia, anemia, psoriasis, chronic right foot ulcer presented from orthopedics office because of concern for foot infection.  He was admitted for osteomyelitis of left foot with necrotic changes.  He was started on broad-spectrum antibiotics and orthopedics was consulted.  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 Subsequently, he has tolerated PT; he will need HHPT on discharge. Orthopedics has cleared him for discharge and antibiotics have already been discontinued. He will be discharged home today.  Discharge Diagnoses:   Acute osteomyelitis of right foot/dry gangrene/ulcer of toes of right foot with superimposed cellulitis Severe sepsis: Present on admission, from above Lactic acidosis: Present on admission -Presented with tachycardia, leukocytosis, lactic acidosis with evidence of acute osteomyelitis. -MRI with evidence of osteomyelitis of the toes.  Treated with broad-spectrum antibiotics.  Blood cultures negative so far -Orthopedics following: Underwent right transmetatarsal amputation on 11/15/2022.  Wound care and pain management as per orthopedics recommendations.   -Sepsis has resolved -Subsequently, he has tolerated PT; he will  need HHPT on discharge. Orthopedics has cleared him for discharge and antibiotics have already been discontinued. He will be discharged home today.  Acute kidney injury -Creatinine 1.6 on presentation.  Resolved and creatinine 1.13 on 11/15/2022.   Diabetes mellitus type 2 uncontrolled with hyperglycemia -Resume home regimen on discharge.  A1c 7.  Carb modified diet.   Leukocytosis -Resolved   Hyponatremia -Mild.  Monitor intermittently.  No labs today.   Hypomagnesemia -Improved   Anemia of chronic disease -From chronic illnesses.  Hemoglobin stable.   Hypertension Hyperlipidemia Coronary artery disease -Continue aspirin, statin and metoprolol succinate.  Blood pressure currently stable.  Outpatient follow-up with PCP/cardiology   Depression/anxiety -Continue paroxetine, bupropion, buspirone and as needed clonazepam   Chronic pain/opioid dependence -On Suboxone as an outpatient which has been continued.  Continue gabapentin    BPH -Continue tamsulosin   Obesity -Outpatient follow-up   Discharge Instructions  Discharge Instructions     Diet - low sodium heart healthy   Complete by: As directed    Diet Carb Modified   Complete by: As directed    Discharge wound care:   Complete by: As directed    As per Dr. Audrie Lia recommendations   Increase activity slowly   Complete by: As directed    Negative Pressure Wound Therapy - Incisional   Complete by: As directed    Attached the wound VAC dressing to the Praveena plus portable wound VAC pump for discharge.      Allergies as of 11/16/2022       Reactions   Penicillins Hives, Swelling, Rash   Has patient had a PCN reaction causing immediate rash, facial/tongue/throat swelling, SOB or lightheadedness with hypotension: No Has patient had a PCN reaction causing severe rash involving mucus membranes or skin necrosis: No Has patient had a PCN reaction that required hospitalization No Has patient had a PCN reaction  occurring within the last 10 years: No If all of the above answers are "NO", then may proceed with Cephalosporin use.   Cymbalta [duloxetine Hcl] Other (See Comments)   "makes me crazy" depression        Medication List     STOP taking these medications    doxycycline 100 MG tablet Commonly known as: ADOXA       TAKE these medications    albuterol 108 (90 Base) MCG/ACT inhaler Commonly known as: VENTOLIN HFA Inhale 2 puffs into the lungs every 6 (six) hours as needed for wheezing or shortness of breath.   aspirin 325 MG tablet Take 325 mg by mouth daily.   Buprenorphine HCl-Naloxone HCl 8-2 MG Film Place 2 Film under the tongue daily as needed (for severe pain).   buPROPion 100 MG tablet Commonly known as: WELLBUTRIN Take 100 mg by mouth daily.   busPIRone 10 MG tablet Commonly known as: BUSPAR Take 10 mg by mouth 2 (two) times daily.   Centrum Silver 50+Men Tabs Take 1 tablet by mouth daily.   clonazePAM 1 MG tablet Commonly known as: KLONOPIN Take 1 mg by mouth at bedtime as needed for anxiety (sleep).   Dexcom G7 Sensor Misc 1 Device by Does not apply route as directed.   fenofibrate 48 MG tablet Commonly known as: TRICOR Take 48 mg by mouth daily.   ferrous sulfate 325 (65 FE) MG tablet Take 325 mg by mouth daily with breakfast.   gabapentin 300 MG capsule Commonly known as: NEURONTIN Take 1 capsule (300 mg total) by mouth 4 (four) times daily.   glipiZIDE 10 MG tablet Commonly known as: GLUCOTROL Take 10 mg by mouth 2 (two) times daily before a meal.   HumaLOG KwikPen 100 UNIT/ML KwikPen Generic drug: insulin lispro Inject 15 Units into the skin 3 (three) times daily before meals.   HYDROcodone-acetaminophen 5-325 MG tablet Commonly known as: NORCO/VICODIN Take 1 tablet by mouth every 6 (six) hours as needed for moderate pain.   magnesium oxide 400 MG tablet Commonly known as: MAG-OX Take 400 mg by mouth 2 (two) times daily.    metFORMIN 500 MG tablet Commonly known as: GLUCOPHAGE Take 2 tablets (1,000 mg total) by mouth 2 (two) times daily.   metoprolol succinate 25 MG 24 hr tablet Commonly known as: TOPROL-XL Take 25 mg by mouth daily.   nitroGLYCERIN 0.4 MG SL tablet Commonly known as: NITROSTAT Place 1 tablet (0.4 mg total) under the tongue every 5 (five) minutes as needed for chest pain.   omeprazole 40 MG capsule Commonly known as: PRILOSEC Take 40 mg by mouth daily.   PARoxetine 40 MG tablet Commonly known as: PAXIL Take 40 mg by mouth daily.   polyethylene glycol 17 g packet Commonly known as: MIRALAX / GLYCOLAX Take 17 g by mouth daily as needed for mild constipation.   ranolazine 500 MG 12 hr tablet Commonly known as: Ranexa Take 1 tablet (500 mg total) by mouth 2 (two) times daily.   rosuvastatin 10 MG tablet Commonly known as: CRESTOR Take 10 mg by mouth at bedtime.   tamsulosin 0.4 MG Caps capsule Commonly known as: FLOMAX Take 0.8 mg by mouth daily.   Toujeo SoloStar 300 UNIT/ML Solostar Pen Generic drug: insulin glargine (1 Unit Dial) Inject 50 Units into the skin daily in the afternoon.   triamcinolone cream 0.1 % Commonly known as: KENALOG Apply 1 application topically 2 (two) times daily as needed for rash.  Discharge Care Instructions  (From admission, onward)           Start     Ordered   11/16/22 0000  Discharge wound care:       Comments: As per Dr. Audrie Lia recommendations   11/16/22 1621              Follow-up Information     Nadara Mustard, MD Follow up in 1 week(s).   Specialty: Orthopedic Surgery Contact information: [address] [place] [address] [phone]         [name], MD. Schedule an appointment as soon as possible for a visit in 1 week(s).   Specialty: Internal Medicine Contact information: [address] [place] [address] [phone]                Allergies  Allergen  Reactions   Penicillins Hives, Swelling and Rash    Has patient had a PCN reaction causing immediate rash, facial/tongue/throat swelling, SOB or lightheadedness with hypotension: No Has patient had a PCN reaction causing severe rash involving mucus membranes or skin necrosis: No Has patient had a PCN reaction that required hospitalization No Has patient had a PCN reaction occurring within the last 10 years: No If all of the above answers are "NO", then may proceed with Cephalosporin use.    Cymbalta [Duloxetine Hcl] Other (See Comments)    "makes me crazy" depression    Consultations: Orthopedics   Procedures/Studies: MR FOOT LEFT W WO CONTRAST  Result Date: 11/12/2022 CLINICAL DATA:  Soft tissue infection suspected, second toe. EXAM: MRI OF THE LEFT FOREFOOT WITHOUT AND WITH CONTRAST TECHNIQUE: Multiplanar, multisequence MR imaging of the left forefoot was performed both before and after administration of intravenous contrast. CONTRAST:  10mL GADAVIST GADOBUTROL 1 MMOL/ML IV SOLN COMPARISON:  None available FINDINGS: Bones/Joint/Cartilage There is high-grade marrow edema and enhancement of the distal phalanx of the second toe with more moderate edema and enhancement of the second toe middle phalanx. Possible mild erosion of the distal aspect of the distal phalanx of the second toe concerning for acute osteomyelitis. Question minimal increased fluid sensitive signal within the third through fifth distal phalanges, however this is favored to be secondary to inhomogeneous fat saturation and artifact. No surrounding soft tissue swelling or overlying soft tissue ulcers are seen to a secondary concern for acute osteomyelitis of these toes. There are moderate degenerative disc cystic changes in the distal dorsal medial aspect of the great toe metatarsal head. Mild great toe metatarsophalangeal joint space narrowing and peripheral osteophytosis. Ligaments The Lisfranc ligament complex is intact. The  metacarpophalangeal and interphalangeal collateral ligaments appear intact. The great toe sesamoid-phalangeal plantar plate ligaments are intact. Muscles and Tendons The flexor and extensor tendons are intact. Normal size and signal of the regional musculature. Soft tissues There is moderate edema and enhancement of a dorsal greater than plantar aspects of the second toe soft tissues. No definitive soft tissue ulceration is seen. No rim enhancing abscess. IMPRESSION: 1. There is moderate edema and enhancement of a dorsal greater than plantar aspects of the second toe soft tissues. No definitive soft tissue ulceration is seen. No rim enhancing abscess. 2. High-grade marrow edema and enhancement of the distal phalanx of the second toe with more moderate edema and enhancement of the second toe middle phalanx. Possible mild erosion of the distal aspect of the distal phalanx of the second toe concerning for acute osteomyelitis. Electronically Signed   By: Kerin Salen.D.  On: 11/12/2022 19:52   MR FOOT RIGHT WO CONTRAST  Result Date: 11/12/2022 CLINICAL DATA:  Foot pain and swelling.  Diabetic. EXAM: MRI OF THE RIGHT FOREFOOT WITHOUT CONTRAST TECHNIQUE: Multiplanar, multisequence MR imaging of the right foot was performed. No intravenous contrast was administered. COMPARISON:  Radiographs 11/11/2022 FINDINGS: Diffuse subcutaneous soft tissue swelling/edema consistent with cellulitis. No discrete fluid collection to suggest a drainable abscess. No suspect small open wound involving the plantar aspect of the great toe. Multiple sites of osteomyelitis. There are changes of septic arthritis involving the interphalangeal joint of the great toe with dorsal subluxation of the distal phalanx and pathologic fracture of the proximal phalanx. Diffuse osteomyelitis involving the proximal and distal phalanges. No definite involvement of the first MTP joint but patchy marrow edema in the midportion of the first metatarsal  suspicious for another site of osteomyelitis. Also, the FHL tendon is ruptured. Osteomyelitis involving the proximal, middle and distal phalanges of the second toe. Osteomyelitis involving the middle and distal phalanges of the fourth toe. Advanced midfoot neuropathic changes with significant joint space narrowing, degenerative changes and marrow edema. Diffuse myofasciitis without definite MR findings for pyomyositis. IMPRESSION: 1. Diffuse cellulitis and diffuse myofasciitis without definite MR findings for pyomyositis or subcutaneous abscess. 2. Multiple sites of osteomyelitis involving the first, second and fourth toes as detailed above. 3. Septic arthritis involving the interphalangeal joint of the great toe with dorsal subluxation of the distal phalanx and pathologic fracture of the proximal phalanx. 4. Ruptured FHL. 5. Advanced midfoot neuropathic changes. Electronically Signed   By: Rudie Meyer M.D.   On: 11/12/2022 06:57   DG Chest Port 1 View  Result Date: 11/11/2022 CLINICAL DATA:  Questionable sepsis - evaluate for abnormality EXAM: PORTABLE CHEST 1 VIEW COMPARISON:  Chest x-ray 08/28/2017 FINDINGS: The heart and mediastinal contours are within normal limits. No focal consolidation. No pulmonary edema. No pleural effusion. No pneumothorax. No acute osseous abnormality. IMPRESSION: No active disease. Electronically Signed   By: Tish Frederickson M.D.   On: 11/11/2022 22:05   DG Foot Complete Right  Result Date: 11/11/2022 CLINICAL DATA:  Infection. EXAM: RIGHT FOOT COMPLETE - 3 VIEW COMPARISON:  X-ray 09/13/2022 FINDINGS: Soft tissue swelling about the midfoot and great toe. Well corticated plantar and Achilles calcaneal spurs. Mild degenerative changes of the midfoot. No dislocation. However there is a erosive changes involving the base of the distal phalanx of the great toe in the distal aspect of the proximal phalanx medially which is new from previous. There is also a possible fracture of the  distal margin of the proximal phalanx of the great toe. Please correlate for evidence of an open wound. IMPRESSION: Developing erosive changes involving the bone surrounding the interphalangeal joint of the great toe with possible fracture of the distal aspect of the proximal phalanx. Soft tissue swelling. Please correlate for any clinical evidence of infection in addition to trauma. Electronically Signed   By: Karen Kays M.D.   On: 11/11/2022 18:35      Subjective: Patient seen and examined at bedside.  No fever, chest pain, vomiting reported.  Feels eager to go home   Discharge Exam: Vitals:   11/16/22 0840 11/16/22 1543  BP: 124/67 109/70  Pulse: 73 78  Resp: 17 15  Temp: 98.3 F (36.8 C) 98.3 F (36.8 C)  SpO2: 90% 94%    General: On room air currently.  No distress.   Respiratory: Decreased breath sounds at bases bilaterally with some crackles CVS: Rate  controlled currently; S1 and S2 heard  abdominal: Soft, nontender, still slightly distended; no organomegaly; bowel sounds normally heard  extremities: Trace lower extremity edema present; no cyanosis.  Right foot dressing present with wound VAC.    The results of significant diagnostics from this hospitalization (including imaging, microbiology, ancillary and laboratory) are listed below for reference.     Microbiology: Recent Results (from the past 240 hour(s))  Culture, blood (Routine x 2)     Status: None   Collection Time: 11/11/22  6:00 PM   Specimen: BLOOD LEFT ARM  Result Value Ref Range Status   Specimen Description BLOOD LEFT ARM  Final   Special Requests   Final    BOTTLES DRAWN AEROBIC AND ANAEROBIC Blood Culture adequate volume   Culture   Final    NO GROWTH 5 DAYS Performed at [facility], [address] [place], [place] [address]    Report Status 11/16/2022 FINAL  Final  Culture, blood (Routine x 2)     Status: None   Collection Time: 11/11/22  9:40 PM   Specimen: BLOOD  Result Value Ref  Range Status   Specimen Description BLOOD LEFT ANTECUBITAL  Final   Special Requests   Final    BOTTLES DRAWN AEROBIC AND ANAEROBIC Blood Culture adequate volume   Culture   Final    NO GROWTH 5 DAYS Performed at [facility], [address] [place], [place] [address]    Report Status 11/16/2022 FINAL  Final  Resp panel by RT-PCR (RSV, Flu A&B, Covid) Anterior Nasal Swab     Status: None   Collection Time: 11/11/22  9:46 PM   Specimen: Anterior Nasal Swab  Result Value Ref Range Status   SARS Coronavirus 2 by RT PCR NEGATIVE NEGATIVE Final   Influenza A by PCR NEGATIVE NEGATIVE Final   Influenza B by PCR NEGATIVE NEGATIVE Final    Comment: (NOTE) The Xpert Xpress SARS-CoV-2/FLU/RSV plus assay is intended as an aid in the diagnosis of influenza from Nasopharyngeal swab specimens and should not be used as a sole basis for treatment. Nasal washings and aspirates are unacceptable for Xpert Xpress SARS-CoV-2/FLU/RSV testing.  Fact Sheet for Patients: BloggerCourse.com  Fact Sheet for Healthcare Providers: SeriousBroker.it  This test is not yet approved or cleared by the [place] FDA and has been authorized for detection and/or diagnosis of SARS-CoV-2 by FDA under an Emergency Use Authorization (EUA). This EUA will remain in effect (meaning this test can be used) for the duration of the COVID-19 declaration under Section 564(b)(1) of the Act, 21 U.S.C. section 360bbb-3(b)(1), unless the authorization is terminated or revoked.     Resp Syncytial Virus by PCR NEGATIVE NEGATIVE Final    Comment: (NOTE) Fact Sheet for Patients: BloggerCourse.com  Fact Sheet for Healthcare Providers: SeriousBroker.it  This test is not yet approved or cleared by the [place] FDA and has been authorized for detection and/or diagnosis of SARS-CoV-2 by FDA under an Emergency Use  Authorization (EUA). This EUA will remain in effect (meaning this test can be used) for the duration of the COVID-19 declaration under Section 564(b)(1) of the Act, 21 U.S.C. section 360bbb-3(b)(1), unless the authorization is terminated or revoked.  Performed at [facility], [address], [place], [place] [address]   MRSA Next Gen by PCR, Nasal     Status: None   Collection Time: 11/14/22  6:02 PM   Specimen: Nasal Mucosa; Nasal Swab  Result Value Ref Range  Status   MRSA by PCR Next Gen NOT DETECTED NOT DETECTED Final    Comment: (NOTE) The GeneXpert MRSA Assay (FDA approved for NASAL specimens only), is one component of a comprehensive MRSA colonization surveillance program. It is not intended to diagnose MRSA infection nor to guide or monitor treatment for MRSA infections. Test performance is not FDA approved in patients less than 18 years old. Performed at [facility], [address] [place], [place] [address]      Labs: BNP (last 3 results) No results for input(s): "BNP" in the last 8760 hours. Basic Metabolic Panel: Recent Labs  Lab 11/11/22 1800 11/12/22 0253 11/13/22 0106 11/14/22 0323 11/15/22 0244  NA 133* 131* 134* 136 133*  K 4.2 3.8 3.8 4.2 3.9  CL 95* 97* 101 101 100  CO2 25 24 25 26 25   GLUCOSE 275* 187* 159* 133* 163*  BUN 17 18 15 17 19   CREATININE 1.60* 1.22 1.10 1.18 1.13  CALCIUM 9.4 8.5* 9.0 9.1 8.8*  MG  --   --   --  1.6* 1.8   Liver Function Tests: Recent Labs  Lab 11/11/22 1800  AST 21  ALT 13  ALKPHOS 113  BILITOT 0.5  PROT 8.9*  ALBUMIN 3.4*   No results for input(s): "LIPASE", "AMYLASE" in the last 168 hours. No results for input(s): "AMMONIA" in the last 168 hours. CBC: Recent Labs  Lab 11/11/22 1800 11/12/22 0253 11/13/22 0106  WBC 13.1* 11.1* 8.4  NEUTROABS 9.6*  --  4.7  HGB 11.0* 9.2* 10.2*  HCT 35.5* 29.3* 32.1*  MCV 88.5 86.4 86.3  PLT 361 294 320   Cardiac Enzymes: No results for input(s):  "CKTOTAL", "CKMB", "CKMBINDEX", "TROPONINI" in the last 168 hours. BNP: Invalid input(s): "POCBNP" CBG: Recent Labs  Lab 11/15/22 1625 11/15/22 2104 11/16/22 0841 11/16/22 1250 11/16/22 1543  GLUCAP 178* 133* 150* 239* 156*   D-Dimer No results for input(s): "DDIMER" in the last 72 hours. Hgb A1c No results for input(s): "HGBA1C" in the last 72 hours. Lipid Profile No results for input(s): "CHOL", "HDL", "LDLCALC", "TRIG", "CHOLHDL", "LDLDIRECT" in the last 72 hours. Thyroid function studies No results for input(s): "TSH", "T4TOTAL", "T3FREE", "THYROIDAB" in the last 72 hours.  Invalid input(s): "FREET3" Anemia work up No results for input(s): "VITAMINB12", "FOLATE", "FERRITIN", "TIBC", "IRON", "RETICCTPCT" in the last 72 hours. Urinalysis    Component Value Date/Time   COLORURINE YELLOW 10/04/2016 1602   APPEARANCEUR CLEAR 10/04/2016 1602   LABSPEC 1.015 10/04/2016 1602   PHURINE 7.0 10/04/2016 1602   GLUCOSEU NEGATIVE 10/04/2016 1602   HGBUR NEGATIVE 10/04/2016 1602   BILIRUBINUR NEGATIVE 10/04/2016 1602   KETONESUR NEGATIVE 10/04/2016 1602   PROTEINUR NEGATIVE 10/04/2016 1602   NITRITE NEGATIVE 10/04/2016 1602   LEUKOCYTESUR TRACE (A) 10/04/2016 1602   Sepsis Labs Recent Labs  Lab 11/11/22 1800 11/12/22 0253 11/13/22 0106  WBC 13.1* 11.1* 8.4   Microbiology Recent Results (from the past 240 hour(s))  Culture, blood (Routine x 2)     Status: None   Collection Time: 11/11/22  6:00 PM   Specimen: BLOOD LEFT ARM  Result Value Ref Range Status   Specimen Description BLOOD LEFT ARM  Final   Special Requests   Final    BOTTLES DRAWN AEROBIC AND ANAEROBIC Blood Culture adequate volume   Culture   Final    NO GROWTH 5 DAYS Performed at [facility], [address] [place], [place] [address]    Report Status  11/16/2022 FINAL  Final  Culture, blood (Routine x 2)     Status: None   Collection Time: 11/11/22  9:40 PM   Specimen: BLOOD  Result Value Ref  Range Status   Specimen Description BLOOD LEFT ANTECUBITAL  Final   Special Requests   Final    BOTTLES DRAWN AEROBIC AND ANAEROBIC Blood Culture adequate volume   Culture   Final    NO GROWTH 5 DAYS Performed at [facility], [address] [place], [place] [address]    Report Status 11/16/2022 FINAL  Final  Resp panel by RT-PCR (RSV, Flu A&B, Covid) Anterior Nasal Swab     Status: None   Collection Time: 11/11/22  9:46 PM   Specimen: Anterior Nasal Swab  Result Value Ref Range Status   SARS Coronavirus 2 by RT PCR NEGATIVE NEGATIVE Final   Influenza A by PCR NEGATIVE NEGATIVE Final   Influenza B by PCR NEGATIVE NEGATIVE Final    Comment: (NOTE) The Xpert Xpress SARS-CoV-2/FLU/RSV plus assay is intended as an aid in the diagnosis of influenza from Nasopharyngeal swab specimens and should not be used as a sole basis for treatment. Nasal washings and aspirates are unacceptable for Xpert Xpress SARS-CoV-2/FLU/RSV testing.  Fact Sheet for Patients: BloggerCourse.com  Fact Sheet for Healthcare Providers: SeriousBroker.it  This test is not yet approved or cleared by the [place] FDA and has been authorized for detection and/or diagnosis of SARS-CoV-2 by FDA under an Emergency Use Authorization (EUA). This EUA will remain in effect (meaning this test can be used) for the duration of the COVID-19 declaration under Section 564(b)(1) of the Act, 21 U.S.C. section 360bbb-3(b)(1), unless the authorization is terminated or revoked.     Resp Syncytial Virus by PCR NEGATIVE NEGATIVE Final    Comment: (NOTE) Fact Sheet for Patients: BloggerCourse.com  Fact Sheet for Healthcare Providers: SeriousBroker.it  This test is not yet approved or cleared by the [place] FDA and has been authorized for detection and/or diagnosis of SARS-CoV-2 by FDA under an Emergency Use  Authorization (EUA). This EUA will remain in effect (meaning this test can be used) for the duration of the COVID-19 declaration under Section 564(b)(1) of the Act, 21 U.S.C. section 360bbb-3(b)(1), unless the authorization is terminated or revoked.  Performed at [facility], [address], [place], [place] [address]   MRSA Next Gen by PCR, Nasal     Status: None   Collection Time: 11/14/22  6:02 PM   Specimen: Nasal Mucosa; Nasal Swab  Result Value Ref Range Status   MRSA by PCR Next Gen NOT DETECTED NOT DETECTED Final    Comment: (NOTE) The GeneXpert MRSA Assay (FDA approved for NASAL specimens only), is one component of a comprehensive MRSA colonization surveillance program. It is not intended to diagnose MRSA infection nor to guide or monitor treatment for MRSA infections. Test performance is not FDA approved in patients less than 32 years old. Performed at [facility], [address] [place], [place] [address]      Time coordinating discharge: 35 minutes  SIGNED:   Glade Lloyd, MD  [facility] 11/16/2022, 4:24 PM

## 2022-11-16 NOTE — Progress Notes (Signed)
PROGRESS NOTE    Kyle [name]  UJW:[id number] DOB: July 15, 1961 DOA: 11/11/2022 PCP: [name], MD   Brief Narrative:  62 year old gentleman with history of coronary artery disease, insulin-dependent type 2 diabetes, hypertension, hyperlipidemia, anemia, psoriasis, chronic right foot ulcer presented from orthopedics office because of concern for foot infection.  He was admitted for osteomyelitis of left foot with necrotic changes.  He was started on broad-spectrum antibiotics and orthopedics was consulted.  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 -MRI with evidence of osteomyelitis of the toes.  Currently on broad-spectrum antibiotics.  Blood cultures negative so far -Orthopedics following: Underwent right transmetatarsal amputation on 11/15/2022.  Wound care and pain management as per orthopedics recommendations.  Will possibly discontinue antibiotics today as per orthopedics recommendations.  Acute kidney injury -Creatinine 1.6 on presentation.  Resolved and creatinine 1.13 on 11/15/2022.  Diabetes mellitus type 2 uncontrolled with hyperglycemia -Metformin on hold.  Continue CBGs with SSI along with NovoLog with meals and long-acting insulin.  A1c 7.  Carb modified diet.  Leukocytosis -Resolved  Hyponatremia -Mild.  Monitor intermittently.  No labs today.  Hypomagnesemia -Improved  Anemia of chronic disease -From chronic illnesses.  Hemoglobin stable.  Hypertension Hyperlipidemia Coronary artery disease -Continue aspirin, statin and metoprolol succinate.  Blood pressure currently stable.  Outpatient follow-up with  PCP/cardiology  Depression/anxiety -Continue paroxetine, bupropion, buspirone and as needed clonazepam  Chronic pain/opioid dependence -On Suboxone as an outpatient which has been continued.  Continue gabapentin and as needed Norco  BPH -Continue tamsulosin  Obesity -Outpatient follow-up    DVT prophylaxis: Heparin subcutaneous Code Status: Full Family Communication: Wife at bedside on  Disposition Plan: Status is: Inpatient Remains inpatient appropriate because: Of severity of illness.  Consultants: Orthopedics  Procedures: As above  Antimicrobials: Vancomycin, cefepime and Flagyl from 11/11/2022 onwards  Subjective: Patient seen and examined at bedside.  No fever, chest pain, vomiting reported.  Feels eager to go home but has not worked with physical therapy yet.   Objective: Vitals:   11/15/22 1330 11/15/22 1438 11/15/22 2139 11/16/22 0457  BP: 131/74 (!) 139/90 121/66 (!) 105/52  Pulse: 72 75 79 71  Resp: 10 14 16 17   Temp:  98.2 F (36.8 C) 98.5 F (36.9 C) 98 F (36.7 C)  TempSrc:  Oral Oral   SpO2: 95% 98% 91% 92%  Weight:      Height:        Intake/Output Summary (Last 24 hours) at 11/16/2022 0817 Last data filed at 11/15/2022 2223 Gross per 24 hour  Intake 1100 ml  Output 550 ml  Net 550 ml    Filed Weights   11/12/22 0200  Weight: 99.6 kg    Examination:  General: On room air currently.  No distress.   Respiratory: Decreased breath sounds at bases bilaterally with some crackles CVS: Rate controlled currently; S1 and S2 heard  abdominal: Soft, nontender, still slightly distended; no organomegaly; bowel sounds normally heard  extremities: Trace lower extremity edema present; no cyanosis.  Right foot dressing present with wound VAC.    Data Reviewed: I have personally reviewed following labs and imaging studies  CBC: Recent Labs  Lab 11/11/22 1800 11/12/22 0253 11/13/22 0106  WBC 13.1* 11.1* 8.4  NEUTROABS 9.6*  --  4.7  HGB 11.0*  9.2* 10.2*  HCT 35.5* 29.3* 32.1*  MCV 88.5 86.4 86.3  PLT 361 294 320    Basic Metabolic Panel: Recent Labs  Lab 11/11/22 1800 11/12/22 0253 11/13/22 0106 11/14/22 0323 11/15/22 0244  NA 133* 131* 134* 136 133*  K 4.2 3.8 3.8 4.2 3.9  CL 95* 97* 101 101 100  CO2 GLUCOSE 275* 187* 159* 133* 163*  BUN CREATININE 1.60* 1.22 1.10 1.18 1.13  CALCIUM 9.4 8.5* 9.0 9.1 8.8*  MG  --   --   --  1.6* 1.8    GFR: Estimated Creatinine Clearance: 76.8 mL/min (by C-G formula based on SCr of 1.13 mg/dL). Liver Function Tests: Recent Labs  Lab 11/11/22 1800  AST 21  ALT 13  ALKPHOS 113  BILITOT 0.5  PROT 8.9*  ALBUMIN 3.4*    No results for input(s): "LIPASE", "AMYLASE" in the last 168 hours. No results for input(s): "AMMONIA" in the last 168 hours. Coagulation Profile: Recent Labs  Lab 11/11/22 1800  INR 1.2    Cardiac Enzymes: No results for input(s): "CKTOTAL", "CKMB", "CKMBINDEX", "TROPONINI" in the last 168 hours. BNP (last 3 results) No results for input(s): "PROBNP" in the last 8760 hours. HbA1C: No results for input(s): "HGBA1C" in the last 72 hours.  CBG: Recent Labs  Lab 11/15/22 1104 11/15/22 1259 11/15/22 1432 11/15/22 1625 11/15/22 2104  GLUCAP 161* 143* 132* 178* 133*    Lipid Profile: No results for input(s): "CHOL", "HDL", "LDLCALC", "TRIG", "CHOLHDL", "LDLDIRECT" in the last 72 hours. Thyroid Function Tests: No results for input(s): "TSH", "T4TOTAL", "FREET4", "T3FREE", "THYROIDAB" in the last 72 hours. Anemia Panel: No results for input(s): "VITAMINB12", "FOLATE", "FERRITIN", "TIBC", "IRON", "RETICCTPCT" in the last 72 hours. Sepsis Labs: Recent Labs  Lab 11/11/22 1800 11/12/22 0030  LATICACIDVEN 3.9* 1.9     Recent Results (from the past 240 hour(s))  Culture, blood (Routine x 2)     Status: None (Preliminary result)   Collection Time: 11/11/22  6:00 PM   Specimen: BLOOD LEFT ARM  Result Value Ref  Range Status   Specimen Description BLOOD LEFT ARM  Final   Special Requests   Final    BOTTLES DRAWN AEROBIC AND ANAEROBIC Blood Culture adequate volume   Culture   Final    NO GROWTH 4 DAYS Performed at [facility], [address] [place], [place] [address]    Report Status PENDING  Incomplete  Culture, blood (Routine x 2)     Status: None (Preliminary result)   Collection Time: 11/11/22  9:40 PM   Specimen: BLOOD  Result Value Ref Range Status   Specimen Description BLOOD LEFT ANTECUBITAL  Final   Special Requests   Final    BOTTLES DRAWN AEROBIC AND ANAEROBIC Blood Culture adequate volume   Culture   Final    NO GROWTH 4 DAYS Performed at [facility], [address] [place], [place] [address]    Report Status PENDING  Incomplete  Resp panel by RT-PCR (RSV, Flu A&B, Covid) Anterior Nasal Swab     Status: None   Collection Time: 11/11/22  9:46 PM   Specimen: Anterior Nasal Swab  Result Value Ref Range Status   SARS Coronavirus 2 by RT PCR NEGATIVE NEGATIVE Final   Influenza A by PCR NEGATIVE NEGATIVE Final   Influenza B by PCR NEGATIVE NEGATIVE Final    Comment: (NOTE) The  Xpert Xpress SARS-CoV-2/FLU/RSV plus assay is intended as an aid in the diagnosis of influenza from Nasopharyngeal swab specimens and should not be used as a sole basis for treatment. Nasal washings and aspirates are unacceptable for Xpert Xpress SARS-CoV-2/FLU/RSV testing.  Fact Sheet for Patients: BloggerCourse.com  Fact Sheet for Healthcare Providers: SeriousBroker.it  This test is not yet approved or cleared by the [place] FDA and has been authorized for detection and/or diagnosis of SARS-CoV-2 by FDA under an Emergency Use Authorization (EUA). This EUA will remain in effect (meaning this test can be used) for the duration of the COVID-19 declaration under Section 564(b)(1) of the Act, 21 U.S.C. section 360bbb-3(b)(1), unless  the authorization is terminated or revoked.     Resp Syncytial Virus by PCR NEGATIVE NEGATIVE Final    Comment: (NOTE) Fact Sheet for Patients: BloggerCourse.com  Fact Sheet for Healthcare Providers: SeriousBroker.it  This test is not yet approved or cleared by the [place] FDA and has been authorized for detection and/or diagnosis of SARS-CoV-2 by FDA under an Emergency Use Authorization (EUA). This EUA will remain in effect (meaning this test can be used) for the duration of the COVID-19 declaration under Section 564(b)(1) of the Act, 21 U.S.C. section 360bbb-3(b)(1), unless the authorization is terminated or revoked.  Performed at [facility], [address], [place], [place] [address]   MRSA Next Gen by PCR, Nasal     Status: None   Collection Time: 11/14/22  6:02 PM   Specimen: Nasal Mucosa; Nasal Swab  Result Value Ref Range Status   MRSA by PCR Next Gen NOT DETECTED NOT DETECTED Final    Comment: (NOTE) The GeneXpert MRSA Assay (FDA approved for NASAL specimens only), is one component of a comprehensive MRSA colonization surveillance program. It is not intended to diagnose MRSA infection nor to guide or monitor treatment for MRSA infections. Test performance is not FDA approved in patients less than 19 years old. Performed at [facility], [address] [place], [place] [address]          Radiology Studies: No results found.      Scheduled Meds:  vitamin C  1,000 mg Oral Daily   aspirin  325 mg Oral Daily   buprenorphine-naloxone  1 tablet Sublingual Daily   buPROPion  100 mg Oral Daily   busPIRone  10 mg Oral BID   docusate sodium  100 mg Oral Daily   ferrous sulfate  325 mg Oral Q breakfast   gabapentin  300 mg Oral QID   heparin  5,000 Units Subcutaneous Q8H   insulin aspart  0-15 Units Subcutaneous TID WC   insulin aspart  0-5 Units Subcutaneous QHS   insulin aspart  10 Units  Subcutaneous TID WC   insulin glargine-yfgn  50 Units Subcutaneous Daily   metoprolol succinate  25 mg Oral Daily   multivitamin with minerals  1 tablet Oral Daily   nutrition supplement (JUVEN)  1 packet Oral BID BM   pantoprazole  40 mg Oral Daily   PARoxetine  40 mg Oral Daily   rosuvastatin  20 mg Oral QHS   tamsulosin  0.8 mg Oral Daily   zinc sulfate  220 mg Oral Daily   Continuous Infusions:  sodium chloride 75 mL/hr at 11/15/22 1439   ceFEPime (MAXIPIME) IV 2 g (11/15/22 2139)   magnesium sulfate bolus IVPB     vancomycin 1,250 mg (11/15/22 2335)  Glade Lloyd, MD [facility] 11/16/2022, 8:17 AM

## 2022-11-16 NOTE — Progress Notes (Signed)
Patient ID: Kyle [name], male   DOB: Jun 11, 1961, 62 y.o.   MRN: [id number] Patient is postoperative day 1 transmetatarsal amputation.  There is 50 cc in the wound VAC canister.  Patient may discharge to home when he is safe with therapy.  Patient may weight-bear on the heel minimize weightbearing.  Orders written to transition to the Praveena plus portable wound VAC pump for discharge.

## 2022-11-16 NOTE — Evaluation (Signed)
Physical Therapy Evaluation Patient Details Name: Kyle [name] MRN: [id number] DOB: 12-31-1960 Today's Date: 11/16/2022  History of Present Illness  62 year old gentleman presented 4/15 from orthopedics office because of concern for foot infection. S/p right transmetatarsal amputation 4/19. PMH: coronary artery disease, insulin-dependent type 2 diabetes, hypertension, hyperlipidemia, anemia, psoriasis, chronic right foot ulcer.  Clinical Impression  Patient is s/p above surgery resulting in functional limitations due to the deficits listed below (see PT Problem List). Previously independent. Requires supervision for safety. Educated on precautions, AD use with RW, navigating in room while safely maintaining WB precautions today. Wife present, very supportive. Pt has RW and W/c at home with ramped entrance. All questions answered. Wife prefers HHPT visit due to concern with easily getting in/out of house until he can attend OP visits after visiting with surgeon at follow-up.  Patient will benefit from acute skilled PT to increase their independence and safety with mobility to facilitate discharge. Adequate for d/c from mobility standpoint, family and pt eager to return home when cleared by medical team.        Recommendations for follow up therapy are one component of a multi-disciplinary discharge planning process, led by the attending physician.  Recommendations may be updated based on patient status, additional functional criteria and insurance authorization.  Follow Up Recommendations  HHPT safety visit follow-up.     Assistance Recommended at Discharge Intermittent Supervision/Assistance  Patient can return home with the following  A little help with walking and/or transfers;A little help with bathing/dressing/bathroom;Assistance with cooking/housework;Help with stairs or ramp for entrance;Assist for transportation    Equipment Recommendations None recommended by PT  Recommendations for  Other Services       Functional Status Assessment Patient has had a recent decline in their functional status and demonstrates the ability to make significant improvements in function in a reasonable and predictable amount of time.     Precautions / Restrictions Precautions Precautions: Fall Required Braces or Orthoses: Other Brace Other Brace: post op shoe Restrictions Weight Bearing Restrictions: Yes RLE Weight Bearing: Non weight bearing      Mobility  Bed Mobility Overal bed mobility: Modified Independent             General bed mobility comments: extra time no assist    Transfers Overall transfer level: Needs assistance Equipment used: Rolling walker (2 wheels) Transfers: Sit to/from Stand Sit to Stand: Supervision           General transfer comment: Supervision for safety. Appropriate hand placement, good boost to stand. Cues for technique. Stable with RW for support.    Ambulation/Gait Ambulation/Gait assistance: Supervision Gait Distance (Feet): 20 Feet Assistive device: Rolling walker (2 wheels) Gait Pattern/deviations:  (hop) Gait velocity: decr Gait velocity interpretation: <1.31 ft/sec, indicative of household ambulator   General Gait Details: Educated on AD use with RW for support. Hops to and maintains NWB on Rt with post-op boot in place. NO overt LOB noted. Cues for walker placement, sequencing, and stability technique for safety. Able to ambulate in room and turn safely at supervision level.  Stairs            Wheelchair Mobility    Modified Rankin (Stroke Patients Only)       Balance Overall balance assessment: Needs assistance Sitting-balance support: No upper extremity supported, Feet supported Sitting balance-Leahy Scale: Good     Standing balance support: Bilateral upper extremity supported, Reliant on assistive device for balance Standing balance-Leahy Scale: Poor Standing balance comment: RW to maintain NWB  ON Rt                              Pertinent Vitals/Pain Pain Assessment Pain Assessment: Faces Faces Pain Scale: Hurts little more Pain Location: Rt foot Pain Descriptors / Indicators: Aching Pain Intervention(s): Monitored during session, Repositioned    Home Living Family/patient expects to be discharged to:: Private residence Living Arrangements: Spouse/significant other;Children Available Help at Discharge: Family;Available 24 hours/day Type of Home: Mobile home Home Access: Ramped entrance       Home Layout: One level Home Equipment: Agricultural consultant (2 wheels);Wheelchair - manual;BSC/3in1      Prior Function Prior Level of Function : Independent/Modified Independent             Mobility Comments: ind ADLs Comments: ind     Hand Dominance        Extremity/Trunk Assessment   Upper Extremity Assessment Upper Extremity Assessment: Defer to OT evaluation    Lower Extremity Assessment Lower Extremity Assessment: RLE deficits/detail RLE Deficits / Details: Wound vac in place. clean/ dry. guarding as expected post op       Communication   Communication: No difficulties  Cognition Arousal/Alertness: Awake/alert Behavior During Therapy: WFL for tasks assessed/performed Overall Cognitive Status: Within Functional Limits for tasks assessed                                          General Comments General comments (skin integrity, edema, etc.): Educated on precautions, LE elevation, mobility and wound vac    Exercises General Exercises - Lower Extremity Ankle Circles/Pumps: AROM, Both, 10 reps, Seated Quad Sets: Strengthening, Both, 10 reps, Seated   Assessment/Plan    PT Assessment Patient needs continued PT services  PT Problem List Decreased strength;Decreased range of motion;Decreased activity tolerance;Decreased balance;Decreased mobility;Decreased knowledge of use of DME;Decreased knowledge of precautions;Pain;Obesity       PT  Treatment Interventions DME instruction;Gait training;Functional mobility training;Therapeutic activities;Therapeutic exercise;Balance training;Neuromuscular re-education;Patient/family education;Modalities    PT Goals (Current goals can be found in the Care Plan section)  Acute Rehab PT Goals Patient Stated Goal: Go home asap PT Goal Formulation: With patient Time For Goal Achievement: 11/23/22 Potential to Achieve Goals: Good    Frequency Min 5X/week     Co-evaluation               AM-PAC PT "6 Clicks" Mobility  Outcome Measure Help needed turning from your back to your side while in a flat bed without using bedrails?: None Help needed moving from lying on your back to sitting on the side of a flat bed without using bedrails?: None Help needed moving to and from a bed to a chair (including a wheelchair)?: A Little Help needed standing up from a chair using your arms (e.g., wheelchair or bedside chair)?: A Little Help needed to walk in hospital room?: A Little Help needed climbing 3-5 steps with a railing? : A Lot 6 Click Score: 19    End of Session Equipment Utilized During Treatment: Gait belt Activity Tolerance: Patient tolerated treatment well Patient left: in chair;with call bell/phone within reach;with chair alarm set;with family/visitor present Nurse Communication: Mobility status PT Visit Diagnosis: Unsteadiness on feet (R26.81);Other abnormalities of gait and mobility (R26.89);Muscle weakness (generalized) (M62.81);Difficulty in walking, not elsewhere classified (R26.2);Pain Pain - Right/Left: Right Pain - part of body: Ankle and  joints of foot    Time: 1157-1220 PT Time Calculation (min) (ACUTE ONLY): 23 min   Charges:   PT Evaluation $PT Eval Low Complexity: 1 Low PT Treatments $Gait Training: 8-22 mins        Kathlyn Sacramento, PT, DPT Physical Therapist Acute Rehabilitation Services [facility] Outpatient Rehabilitation  Services [facility]   Berton Mount 11/16/2022, 2:37 PM

## 2022-11-16 NOTE — Progress Notes (Signed)
Orthopedic Tech Progress Note Patient Details:  Kyle [name] Dec 24, 1960 [id number]  Ortho Devices Type of Ortho Device: Postop shoe/boot Ortho Device/Splint Location: RLE Ortho Device/Splint Interventions: Application   Post Interventions Patient Tolerated: Well  Genelle Bal Coreena Rubalcava 11/16/2022, 10:13 AM

## 2022-11-16 NOTE — Plan of Care (Signed)
Ready for d/c- all belongings with patient. Accompanied by his wife who provided his transport. All discharge instructions reviewed. His medications went to [facility]. All questions/concerns addressed. Wound vac changed to portable one.

## 2022-11-16 NOTE — Plan of Care (Signed)

## 2022-11-18 ENCOUNTER — Telehealth: Payer: Self-pay | Admitting: Orthopedic Surgery

## 2022-11-18 ENCOUNTER — Other Ambulatory Visit: Payer: Self-pay | Admitting: Orthopedic Surgery

## 2022-11-18 MED ORDER — OXYCODONE-ACETAMINOPHEN 5-325 MG PO TABS: 1.0000 | ORAL_TABLET | ORAL | 0 refills | Status: DC | PRN

## 2022-11-18 NOTE — Telephone Encounter (Signed)
Patient called, says the pain medication isn't working. Would like something else for pain called in.

## 2022-11-18 NOTE — Telephone Encounter (Signed)
S/p right transmet amputation 11/15/22, current pain med not helping wanting something else sent in.

## 2022-11-19 ENCOUNTER — Telehealth: Payer: Self-pay | Admitting: Orthopedic Surgery

## 2022-11-19 ENCOUNTER — Other Ambulatory Visit: Payer: Self-pay | Admitting: Orthopedic Surgery

## 2022-11-19 MED ORDER — OXYCODONE-ACETAMINOPHEN 5-325 MG PO TABS: 1.0000 | ORAL_TABLET | ORAL | 0 refills | Status: DC | PRN

## 2022-11-19 NOTE — Telephone Encounter (Signed)
Patient called. Says [facility] needs authorization for the oxycodone from Dr. Lajoyce Corners. His call back number is [phone]

## 2022-11-19 NOTE — Telephone Encounter (Signed)
Pt informed

## 2022-11-19 NOTE — Telephone Encounter (Signed)
S/p 11/15/22, right TMA, okay with taking two of the meds together?

## 2022-11-19 NOTE — Telephone Encounter (Signed)
[facility] [place] wanting to get clarification on Oxycodone refill due to patient being on Suboxone..please advise..[phone] Sheldon)

## 2022-11-19 NOTE — Telephone Encounter (Signed)
See other message about this. [facility] called due to him taking another medication.

## 2022-11-19 NOTE — Telephone Encounter (Signed)
Racheal Patches Retail buyer) called from [facility] called stating that they are out of stock of oxycodone. She states pt normally get controlled meds from [facility] and asking for medication refill to be sent there. Please call pt when sent to [facility]. Arissa phone number is [phone].

## 2022-11-19 NOTE — Telephone Encounter (Signed)
lmtcb

## 2022-11-19 NOTE — Telephone Encounter (Signed)
Can you please send in [facility]codone to [facility], [facility] not have in stock.

## 2022-11-20 ENCOUNTER — Telehealth: Payer: Self-pay | Admitting: Orthopedic Surgery

## 2022-11-20 NOTE — Telephone Encounter (Signed)
[facility] informed [facility] had questions due to him having refills of pain meds through multiple doctors. I let him know that Dr. Lajoyce Corners recently did surgery on patient and that is why he is filling it and will manage it short term for him. They will fill this med for patient.

## 2022-11-20 NOTE — Telephone Encounter (Signed)
SW patient, see previous message on medications.

## 2022-11-20 NOTE — Telephone Encounter (Signed)
Pt states that he is not on suboxone anymore. He called the Rxing doctor of this med to get off of this due to him needing surgery. I will call [facility] to let them know and see if they will fill this medication.

## 2022-11-20 NOTE — Telephone Encounter (Signed)
Patient called. Returning a call to Brittany.  

## 2022-11-22 ENCOUNTER — Telehealth: Payer: Self-pay | Admitting: Orthopedic Surgery

## 2022-11-22 ENCOUNTER — Telehealth: Payer: Self-pay | Admitting: Radiology

## 2022-11-22 NOTE — Telephone Encounter (Signed)
Patient called triage this morning stating he was using his rolling scooter and hit something, slid off backwards, and his foot hit the floor.   He is status post right transmet amputation on 11/15/22.  He is not having increased pain and states there might be a little more bleeding, but currently has wound vac. Routine follow up already scheduled for Monday, 11/25/2022.  He would like to know if you feel that he needs to come in prior to that appointment to be checked out?  CB [phone]

## 2022-11-22 NOTE — Telephone Encounter (Signed)
I called pt and he said that he is not having any increased pain. His wound vac is still working no alarms. He will prop his foot up height than his heart, he will continue to use his knee scooter for ambulation and he will call with any questions ro concerns he states that he did not feel he needed to come in today for eval. "I feel ok" and he will keep his appt for Monday but will call if he changes his mind.

## 2022-11-22 NOTE — Telephone Encounter (Signed)
Patient asking if he can drive. Please advise

## 2022-11-22 NOTE — Telephone Encounter (Signed)
I called pt and advised that he is not able to drive. Will need until he is able to be weight bearing on that foot. Voiced understanding and will call with any other questions.

## 2022-11-25 ENCOUNTER — Encounter (HOSPITAL_COMMUNITY): Payer: Self-pay | Admitting: Orthopedic Surgery

## 2022-11-25 ENCOUNTER — Ambulatory Visit (INDEPENDENT_AMBULATORY_CARE_PROVIDER_SITE_OTHER): Payer: Medicare Other | Admitting: Orthopedic Surgery

## 2022-11-25 DIAGNOSIS — Z89431 Acquired absence of right foot: Secondary | ICD-10-CM

## 2022-11-25 DIAGNOSIS — L03032 Cellulitis of left toe: Secondary | ICD-10-CM

## 2022-11-25 MED ORDER — DOXYCYCLINE HYCLATE 100 MG PO TABS: 100.0000 mg | ORAL_TABLET | Freq: Two times a day (BID) | ORAL | 0 refills | Status: DC

## 2022-11-25 NOTE — Progress Notes (Signed)
Office Visit Note   Patient: Kyle [name]           Date of Birth: January 31, 1961           MRN: [id number] Visit Date: 11/25/2022              Requested by: [name], MD [address] [place],  [place] [address] PCP: [name], MD  Chief Complaint  Patient presents with   Right Foot - Routine Post Op    11/15/22 right transmet amputation      HPI: Patient is a 62 year old gentleman who is 2 weeks status post right transmetatarsal amputation and also has acute redness of the second and fourth toes left foot.  Patient states the wound VAC stopped working Saturday.  Assessment & Plan: Visit Diagnoses: No diagnosis found.  Plan: Wound VAC removed today.  Start Dial soap cleansing for the right foot.  Continue nonweightbearing in the scooter on the right prescription for doxycycline for the right second and fourth toes on the left.  Follow-Up Instructions: Return in about 2 weeks (around 12/09/2022).   Ortho Exam  Patient is alert, oriented, no adenopathy, well-dressed, normal affect, normal respiratory effort. Examination patient has redness and swelling of the second and fourth toes left foot there are no ulcers no drainage no ascending cellulitis.  The right transmetatarsal amputation has increased swelling the wound edges are well-approximated.  Imaging: No results found. No images are attached to the encounter.  Labs: Lab Results  Component Value Date   HGBA1C 7.0 (H) 11/12/2022   HGBA1C 9.2 (H) 10/14/2016   HGBA1C 8.8 (H) 09/03/2016   ESRSEDRATE 79 (H) 11/11/2022   CRP 18.4 (H) 11/11/2022   REPTSTATUS 11/16/2022 FINAL 11/11/2022   CULT  11/11/2022    NO GROWTH 5 DAYS Performed at [facility], [address], [place], [place] [address]      Lab Results  Component Value Date   ALBUMIN 3.4 (L) 11/11/2022    Lab Results  Component Value Date   MG 1.8 11/15/2022   MG 1.6 (L) 11/14/2022   No results found for: "VD25OH"  No results found for:  "PREALBUMIN"    Latest Ref Rng & Units 11/13/2022    1:06 AM 11/12/2022    2:53 AM 11/11/2022    6:00 PM  CBC EXTENDED  WBC 4.0 - 10.5 K/uL 8.4  11.1  13.1   RBC 4.22 - 5.81 MIL/uL 3.72  3.39  4.01   Hemoglobin 13.0 - 17.0 g/dL 82.9  9.2  56.2   HCT 13.0 - 52.0 % 32.1  29.3  35.5   Platelets 150 - 400 K/uL 320  294  361   NEUT# 1.7 - 7.7 K/uL 4.7   9.6   Lymph# 0.7 - 4.0 K/uL 2.4   1.9      There is no height or weight on file to calculate BMI.  Orders:  No orders of the defined types were placed in this encounter.  Meds ordered this encounter  Medications   doxycycline (VIBRA-TABS) 100 MG tablet    Sig: Take 1 tablet (100 mg total) by mouth 2 (two) times daily.    Dispense:  20 tablet    Refill:  0     Procedures: No procedures performed  Clinical Data: No additional findings.  ROS:  All other systems negative, except as noted in the HPI. Review of Systems  Objective: Vital Signs: There were no vitals taken for this visit.  Specialty Comments:  No specialty comments available.  PMFS History: Patient Active Problem List   Diagnosis Date Noted   Gangrene of toe of right foot (HCC) 11/11/2022   AKI (acute kidney injury) (HCC) 11/11/2022   Type 2 diabetes mellitus with hyperglycemia, with long-term current use of insulin (HCC) 04/03/2022   Type 2 diabetes mellitus with diabetic polyneuropathy, with long-term current use of insulin (HCC) 04/03/2022   Diabetes mellitus (HCC) 04/03/2022   Colonic mass 03/04/2022   Skin ulcer of right great toe (HCC) 01/07/2022   Primary insomnia 12/19/2021   Noncompliance 05/30/2021   Back pain of lumbar region with sciatica 01/31/2021   Epididymoorchitis 12/31/2020   Anemia of unknown etiology    Arthritis    Coronary artery disease    Degenerative disc disease, lumbar    Depression    Diabetes mellitus without complication (HCC)    GERD (gastroesophageal reflux disease)    Family history of adverse reaction to anesthesia     Hyperlipemia    Myocardial infarction (HCC)    Elevated alkaline phosphatase level 11/27/2020   Iron deficiency anemia due to chronic blood loss 11/07/2020   Vitamin B12 deficiency 11/02/2020   Anxiety disorder 11/01/2020   Benign prostatic hyperplasia with urinary frequency 11/01/2020   Coronary artery disease of native artery of native heart with stable angina pectoris (HCC) 11/01/2020   Mild intermittent asthma without complication 11/01/2020   Moderate episode of recurrent major depressive disorder (HCC) 11/01/2020   Psoriasis 11/01/2020   Uncomplicated opioid dependence (HCC) 11/01/2020   Primary osteoarthritis involving multiple joints 11/01/2020   Degenerative lumbar disc 11/01/2020   Mixed hyperlipidemia 06/28/2020   Malaise and fatigue 06/28/2020   Primary osteoarthritis of left knee 09/06/2016   Pneumonia 08/2016   History of arthroplasty of right knee 10/20/2015   Sepsis, unspecified organism (HCC) 10/20/2015   Primary osteoarthritis of right knee 09/30/2015   Morbid obesity (HCC) 08/23/2015   Essential (primary) hypertension 08/23/2015   Degeneration of intervertebral disc of lumbosacral region 08/23/2015   Generalized abdominal pain 08/23/2015   Class 2 severe obesity due to excess calories with serious comorbidity and body mass index (BMI) of 38.0 to 38.9 in adult (HCC) 08/23/2015   Type 2 diabetes mellitus without complications (HCC) 08/23/2015   Pain in lower limb 12/07/2013   Plantar fasciitis of left foot 11/01/2013   Metatarsal deformity 11/01/2013   Diabetes (HCC) 11/01/2013   Type 2 diabetes mellitus without complication, with long-term current use of insulin (HCC) 11/01/2013   DEHYDRATION 01/26/2009   DYSTHYMIC DISORDER 01/26/2009   ATHEROSLERO NATV ART EXTREM W/INTERMIT CLAUDICAT 01/26/2009   CLAUDICATION 01/26/2009   DYSPNEA 01/26/2009   CHEST PAIN UNSPECIFIED 01/26/2009   Past Medical History:  Diagnosis Date   Anemia of unknown etiology     Anxiety disorder    Arthritis    ATHEROSLERO NATV ART EXTREM W/INTERMIT CLAUDICAT 01/26/2009   Qualifier: Diagnosis of  By: Valinda Party RN, Nancy     Back pain of lumbar region with sciatica 01/31/2021   Benign prostatic hyperplasia with urinary frequency    CHEST PAIN UNSPECIFIED 01/26/2009   Qualifier: Diagnosis of  By: Valinda Party RN, Harriett Sine     Class 2 severe obesity due to excess calories with serious comorbidity and body mass index (BMI) of 38.0 to 38.9 in adult [facility])    CLAUDICATION 01/26/2009   Qualifier: Diagnosis of  By: Valinda Party RN, Harriett Sine     Coronary artery disease    minimal CAD '12; NL stress test  06/10/14 ([facility])   Coronary artery disease of native artery of native heart with stable angina pectoris (HCC)    Degeneration of intervertebral disc of lumbosacral region 08/23/2015   Degenerative disc disease, lumbar    Degenerative lumbar disc 11/01/2020   Formatting of this note might be different from the original. Was recommended back surgery. Holding out.   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   Qualifier: Diagnosis of  By: Valinda Party RN, Nancy     Elevated alkaline phosphatase level 11/27/2020   Epididymoorchitis 12/31/2020   Essential (primary) hypertension 08/23/2015   Family history of adverse reaction to anesthesia    "it made my son sick"   Generalized abdominal pain 08/23/2015   GERD (gastroesophageal reflux disease)    History of arthroplasty of right knee    Hyperlipemia    Iron deficiency anemia due to chronic blood loss 11/07/2020   Metatarsal deformity 11/01/2013   Mild intermittent asthma without complication    Mixed hyperlipidemia    Moderate episode of recurrent major depressive disorder (HCC)    Morbid obesity (HCC) 08/23/2015   Myocardial infarction (HCC)     2012; hospitalized [facility] for chest pain syndrome 02/2011 and LHC showed trivial CAD   Pain in lower limb 12/07/2013   Plantar fasciitis of left foot 11/01/2013   Pneumonia 08/2016   surgery had to be rescheduled due to pneumonia   Primary localized osteoarthritis of left knee 10/14/2016   Primary osteoarthritis involving multiple joints 11/01/2020   Formatting of this note might be different from the original. both knees replaced.   Primary osteoarthritis of left knee 09/06/2016   Primary osteoarthritis of right knee 09/30/2015   Psoriasis    Sepsis, unspecified organism (HCC) 10/20/2015   Type 2 diabetes mellitus without complication, with long-term current use of insulin (HCC) 11/01/2013   Type 2 diabetes mellitus without complications (HCC) 08/23/2015   Uncomplicated opioid dependence (HCC)    Vitamin B12 deficiency 11/02/2020    Family History  Problem Relation Age of Onset   Cancer Mother    Diabetes Mother    Hyperlipidemia Father    Hypertension Father    Heart failure Father    Heart failure Brother     Past Surgical History:  Procedure Laterality Date   AMPUTATION Right 11/15/2022   Procedure: RIGHT TRANSMETATARSAL AMPUTATION;  Surgeon: Nadara Mustard, MD;  Location: [facility] OR;  Service: Orthopedics;  Laterality: Right;   CARDIAC CATHETERIZATION     03/28/11 LHC: NL LM, LAD; 10% mCX, mRCA. EF 65%. (Dr. Dot Been, [facility])   CHOLECYSTECTOMY     CORONARY ANGIOPLASTY     2012 [facility]    ESOPHAGEAL DILATION     SEVERAL TIMES   Heel Spur Resection with Fasiotomy Left 11/25/2013   @ [facility]   KNEE ARTHROSCOPY Bilateral    SHOULDER ARTHROSCOPY WITH OPEN ROTATOR CUFF REPAIR Right    TOTAL KNEE ARTHROPLASTY Right 10/04/2015   Procedure: RIGHT TOTAL KNEE ARTHROPLASTY;  Surgeon: Gean Birchwood, MD;  Location: [facility] OR;  Service: Orthopedics;  Laterality: Right;   TOTAL KNEE ARTHROPLASTY Left 10/14/2016   TOTAL KNEE ARTHROPLASTY Left 10/14/2016   Procedure: LEFT TOTAL KNEE ARTHROPLASTY;  Surgeon: Gean Birchwood, MD;  Location: [facility] OR;  Service: Orthopedics;  Laterality: Left;   TUMOR REMOVAL     FATTY TUMOR  RT SHOULDER   Social History   Occupational History   Not on file  Tobacco Use   Smoking status: Never   Smokeless tobacco: Never  Vaping Use   Vaping Use: Never used  Substance and Sexual Activity   Alcohol use: No   Drug use: No   Sexual activity: Not on file

## 2022-11-28 ENCOUNTER — Telehealth: Payer: Self-pay | Admitting: Orthopedic Surgery

## 2022-11-28 NOTE — Telephone Encounter (Signed)
Patient stated both medications are not working for his pain apparently and he would like a different medication sent in, please advise

## 2022-11-29 ENCOUNTER — Encounter: Payer: Self-pay | Admitting: Physician Assistant

## 2022-11-29 ENCOUNTER — Ambulatory Visit (INDEPENDENT_AMBULATORY_CARE_PROVIDER_SITE_OTHER): Payer: Medicare Other | Admitting: Physician Assistant

## 2022-11-29 DIAGNOSIS — M79671 Pain in right foot: Secondary | ICD-10-CM

## 2022-11-29 NOTE — Progress Notes (Signed)
Office Visit Note   Patient: Kyle [name]           Date of Birth: [birth year]           MRN: [id number] Visit Date: 11/29/2022              Requested by: [name], MD [address] [place],  [place] [address] PCP: [name], MD  No chief complaint on file.     HPI: Patient is a pleasant 62 year old gentleman who is a patient of Dr. Audrie Lia.  He is 2 weeks status post transmetatarsal amputation.  He was seen by Dr. Lajoyce Corners earlier this week.  He said he came around his truck with his scooter and bumped hard and hit his amputation stump.  Since then it has been more red.  Denies any fever or chills.  It had stopped draining for the most part but now it seems to have opened up a little bit and is draining  Assessment & Plan: Visit Diagnoses: Status post transmetatarsal amputation.  Plan: Patient is already on doxycycline.  He does have some erythema right around the stump site but no ascending cellulitis no foul odor he does have some serous drainage.  Wound does not probe deeply pictures are attached.  He is to elevate above heart level over the weekend.  He should continue to wash the area with Dial soap and water and apply a new dressing.  Would like for him to follow-up with Dr. Lajoyce Corners or Denny Peon next week.  If he has any increase in drainage develops purulent drainage develops any dehiscence or has any ascending cellulitis he is to go to the emergency room over the weekend  Follow-Up Instructions: Next week with Dr. Lajoyce Corners or Francis Gaines Exam  Patient is alert, oriented, no adenopathy, well-dressed, normal affect, normal respiratory effort. Examination of his left foot sutures are in place but he does have some superficial dehiscence between the sutures.  He has some serous drainage no purulent drainage no foul odor nothing probes deeply along the suture line.  Compartments are soft and compressible foot is warm compartments are soft  Imaging: No results found. No images are  attached to the encounter.  Labs: Lab Results  Component Value Date   HGBA1C 7.0 (H) 11/12/2022   HGBA1C 9.2 (H) 10/14/2016   HGBA1C 8.8 (H) 09/03/2016   ESRSEDRATE 79 (H) 11/11/2022   CRP 18.4 (H) 11/11/2022   REPTSTATUS 11/16/2022 FINAL 11/11/2022   CULT  11/11/2022    NO GROWTH 5 DAYS Performed at [facility], [address] [place], [place] [address]      Lab Results  Component Value Date   ALBUMIN 3.4 (L) 11/11/2022    Lab Results  Component Value Date   MG 1.8 11/15/2022   MG 1.6 (L) 11/14/2022   No results found for: "VD25OH"  No results found for: "PREALBUMIN"    Latest Ref Rng & Units 11/13/2022    1:06 AM 11/12/2022    2:53 AM 11/11/2022    6:00 PM  CBC EXTENDED  WBC 4.0 - 10.5 K/uL 8.4  11.1  13.1   RBC 4.22 - 5.81 MIL/uL 3.72  3.39  4.01   Hemoglobin 13.0 - 17.0 g/dL 82.9  9.2  56.2   HCT 13.0 - 52.0 % 32.1  29.3  35.5   Platelets 150 - 400 K/uL 320  294  361   NEUT# 1.7 - 7.7 K/uL 4.7   9.6  Lymph# 0.7 - 4.0 K/uL 2.4   1.9      There is no height or weight on file to calculate BMI.  Orders:  No orders of the defined types were placed in this encounter.  No orders of the defined types were placed in this encounter.    Procedures: No procedures performed  Clinical Data: No additional findings.  ROS:  All other systems negative, except as noted in the HPI. Review of Systems  Objective: Vital Signs: There were no vitals taken for this visit.  Specialty Comments:  No specialty comments available.  PMFS History: Patient Active Problem List   Diagnosis Date Noted   Gangrene of toe of right foot (HCC) 11/11/2022   AKI (acute kidney injury) (HCC) 11/11/2022   Type 2 diabetes mellitus with hyperglycemia, with long-term current use of insulin (HCC) 04/03/2022   Type 2 diabetes mellitus with diabetic polyneuropathy, with long-term current use of insulin (HCC) 04/03/2022   Diabetes mellitus (HCC) 04/03/2022   Colonic mass  03/04/2022   Skin ulcer of right great toe (HCC) 01/07/2022   Primary insomnia 12/19/2021   Noncompliance 05/30/2021   Back pain of lumbar region with sciatica 01/31/2021   Epididymoorchitis 12/31/2020   Anemia of unknown etiology    Arthritis    Coronary artery disease    Degenerative disc disease, lumbar    Depression    Diabetes mellitus without complication (HCC)    GERD (gastroesophageal reflux disease)    Family history of adverse reaction to anesthesia    Hyperlipemia    Myocardial infarction (HCC)    Elevated alkaline phosphatase level 11/27/2020   Iron deficiency anemia due to chronic blood loss 11/07/2020   Vitamin B12 deficiency 11/02/2020   Anxiety disorder 11/01/2020   Benign prostatic hyperplasia with urinary frequency 11/01/2020   Coronary artery disease of native artery of native heart with stable angina pectoris (HCC) 11/01/2020   Mild intermittent asthma without complication 11/01/2020   Moderate episode of recurrent major depressive disorder (HCC) 11/01/2020   Psoriasis 11/01/2020   Uncomplicated opioid dependence (HCC) 11/01/2020   Primary osteoarthritis involving multiple joints 11/01/2020   Degenerative lumbar disc 11/01/2020   Mixed hyperlipidemia 06/28/2020   Malaise and fatigue 06/28/2020   Primary osteoarthritis of left knee 09/06/2016   Pneumonia 08/2016   History of arthroplasty of right knee 10/20/2015   Sepsis, unspecified organism (HCC) 10/20/2015   Primary osteoarthritis of right knee 09/30/2015   Morbid obesity (HCC) 08/23/2015   Essential (primary) hypertension 08/23/2015   Degeneration of intervertebral disc of lumbosacral region 08/23/2015   Generalized abdominal pain 08/23/2015   Class 2 severe obesity due to excess calories with serious comorbidity and body mass index (BMI) of 38.0 to 38.9 in adult (HCC) 08/23/2015   Type 2 diabetes mellitus without complications (HCC) 08/23/2015   Pain in lower limb 12/07/2013   Plantar fasciitis of  left foot 11/01/2013   Metatarsal deformity 11/01/2013   Diabetes (HCC) 11/01/2013   Type 2 diabetes mellitus without complication, with long-term current use of insulin (HCC) 11/01/2013   DEHYDRATION 01/26/2009   DYSTHYMIC DISORDER 01/26/2009   ATHEROSLERO NATV ART EXTREM W/INTERMIT CLAUDICAT 01/26/2009   CLAUDICATION 01/26/2009   DYSPNEA 01/26/2009   CHEST PAIN UNSPECIFIED 01/26/2009   Past Medical History:  Diagnosis Date   Anemia of unknown etiology    Anxiety disorder    Arthritis    ATHEROSLERO NATV ART EXTREM W/INTERMIT CLAUDICAT 01/26/2009   Qualifier: Diagnosis of  By: Madelin Rear  Back pain of lumbar region with sciatica 01/31/2021   Benign prostatic hyperplasia with urinary frequency    CHEST PAIN UNSPECIFIED 01/26/2009   Qualifier: Diagnosis of  By: Valinda Party RN, Rosario Jacks 2 severe obesity due to excess calories with serious comorbidity and body mass index (BMI) of 38.0 to 38.9 in adult [facility])    CLAUDICATION 01/26/2009   Qualifier: Diagnosis of  By: Valinda Party RN, Harriett Sine     Coronary artery disease    minimal CAD '12; NL stress test 06/10/14 ([facility])   Coronary artery disease of native artery of native heart with stable angina pectoris (HCC)    Degeneration of intervertebral disc of lumbosacral region 08/23/2015   Degenerative disc disease, lumbar    Degenerative lumbar disc 11/01/2020   Formatting of this note might be different from the original. Was recommended back surgery. Holding out.   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   Qualifier: Diagnosis of  By: Valinda Party RN, Nancy     Elevated alkaline phosphatase level 11/27/2020   Epididymoorchitis 12/31/2020   Essential (primary) hypertension 08/23/2015   Family history of adverse reaction to  anesthesia    "it made my son sick"   Generalized abdominal pain 08/23/2015   GERD (gastroesophageal reflux disease)    History of arthroplasty of right knee    Hyperlipemia    Iron deficiency anemia due to chronic blood loss 11/07/2020   Metatarsal deformity 11/01/2013   Mild intermittent asthma without complication    Mixed hyperlipidemia    Moderate episode of recurrent major depressive disorder (HCC)    Morbid obesity (HCC) 08/23/2015   Myocardial infarction (HCC)    2012; hospitalized [facility] for chest pain syndrome 02/2011 and LHC showed trivial CAD   Pain in lower limb 12/07/2013   Plantar fasciitis of left foot 11/01/2013   Pneumonia 08/2016   surgery had to be rescheduled due to pneumonia   Primary localized osteoarthritis of left knee 10/14/2016   Primary osteoarthritis involving multiple joints 11/01/2020   Formatting of this note might be different from the original. both knees replaced.   Primary osteoarthritis of left knee 09/06/2016   Primary osteoarthritis of right knee 09/30/2015   Psoriasis    Sepsis, unspecified organism (HCC) 10/20/2015   Type 2 diabetes mellitus without complication, with long-term current use of insulin (HCC) 11/01/2013   Type 2 diabetes mellitus without complications (HCC) 08/23/2015   Uncomplicated opioid dependence (HCC)    Vitamin B12 deficiency 11/02/2020    Family History  Problem Relation Age of Onset   Cancer Mother    Diabetes Mother    Hyperlipidemia Father    Hypertension Father    Heart failure Father    Heart failure Brother     Past Surgical History:  Procedure Laterality Date   AMPUTATION Right 11/15/2022   Procedure: RIGHT TRANSMETATARSAL AMPUTATION;  Surgeon: Nadara Mustard, MD;  Location: [facility] OR;  Service: Orthopedics;  Laterality: Right;   CARDIAC CATHETERIZATION     03/28/11 LHC: NL LM, LAD; 10% mCX, mRCA. EF 65%. (Dr. Dot Been, [facility])   CHOLECYSTECTOMY     CORONARY ANGIOPLASTY     2012 [facility]    ESOPHAGEAL DILATION     SEVERAL  TIMES   Heel Spur Resection with Fasiotomy Left 11/25/2013   @ [facility]   KNEE ARTHROSCOPY Bilateral    SHOULDER ARTHROSCOPY WITH OPEN ROTATOR CUFF REPAIR Right    TOTAL KNEE ARTHROPLASTY Right 10/04/2015   Procedure: RIGHT TOTAL KNEE ARTHROPLASTY;  Surgeon: Gean Birchwood, MD;  Location: [facility] OR;  Service: Orthopedics;  Laterality: Right;   TOTAL KNEE ARTHROPLASTY Left 10/14/2016   TOTAL KNEE ARTHROPLASTY Left 10/14/2016   Procedure: LEFT TOTAL KNEE ARTHROPLASTY;  Surgeon: Gean Birchwood, MD;  Location: [facility] OR;  Service: Orthopedics;  Laterality: Left;   TUMOR REMOVAL     FATTY TUMOR  RT SHOULDER   Social History   Occupational History   Not on file  Tobacco Use   Smoking status: Never   Smokeless tobacco: Never  Vaping Use   Vaping Use: Never used  Substance and Sexual Activity   Alcohol use: No   Drug use: No   Sexual activity: Not on file

## 2022-12-02 ENCOUNTER — Encounter (HOSPITAL_COMMUNITY): Payer: Self-pay | Admitting: Orthopedic Surgery

## 2022-12-03 ENCOUNTER — Encounter (HOSPITAL_COMMUNITY): Payer: Self-pay | Admitting: Orthopedic Surgery

## 2022-12-03 ENCOUNTER — Ambulatory Visit (INDEPENDENT_AMBULATORY_CARE_PROVIDER_SITE_OTHER): Payer: Medicare Other | Admitting: Family

## 2022-12-03 DIAGNOSIS — Z89431 Acquired absence of right foot: Secondary | ICD-10-CM

## 2022-12-03 NOTE — Progress Notes (Signed)
Post-Op Visit Note   Patient: Kyle [name]           Date of Birth: [birth year]           MRN: [id number] Visit Date: 12/03/2022 PCP: [name], MD  Chief Complaint:  Chief Complaint  Patient presents with   Right Foot - Routine Post Op    11/15/22 right transmet amputation    HPI:  HPI The patient is a 62 year old gentleman who is seen in postoperative follow-up status post right transmetatarsal amputation on April 19 unfortunately he has had a fall where he struck the incision with his scooter.  He is currently on a course of doxycycline and is concerned about some erythema about his incision and but this has not changed since the time of surgery.  No systemic symptoms. Ortho Exam Incision approximated with sutures there is gaping of about 3 mm the full length of the incision this is filled in with fibrinous exudative tissue there is mild erythema without warmth over the dorsum of the forefoot there is no cellulitis no odor  Visit Diagnoses: No diagnosis found.  Plan: Continue daily Dial soap cleansing.  Dry dressings.  Elevate for swelling.  Minimize to nonweightbearing.  Follow-Up Instructions: No follow-ups on file.   Imaging: No results found.  Orders:  No orders of the defined types were placed in this encounter.  No orders of the defined types were placed in this encounter.    PMFS History: Patient Active Problem List   Diagnosis Date Noted   Gangrene of toe of right foot (HCC) 11/11/2022   AKI (acute kidney injury) (HCC) 11/11/2022   Type 2 diabetes mellitus with hyperglycemia, with long-term current use of insulin (HCC) 04/03/2022   Type 2 diabetes mellitus with diabetic polyneuropathy, with long-term current use of insulin (HCC) 04/03/2022   Diabetes mellitus (HCC) 04/03/2022   Colonic mass 03/04/2022   Skin ulcer of right great toe (HCC) 01/07/2022   Primary insomnia 12/19/2021   Noncompliance 05/30/2021   Back pain of lumbar region with sciatica  01/31/2021   Epididymoorchitis 12/31/2020   Anemia of unknown etiology    Arthritis    Coronary artery disease    Degenerative disc disease, lumbar    Depression    Diabetes mellitus without complication (HCC)    GERD (gastroesophageal reflux disease)    Family history of adverse reaction to anesthesia    Hyperlipemia    Myocardial infarction (HCC)    Elevated alkaline phosphatase level 11/27/2020   Iron deficiency anemia due to chronic blood loss 11/07/2020   Vitamin B12 deficiency 11/02/2020   Anxiety disorder 11/01/2020   Benign prostatic hyperplasia with urinary frequency 11/01/2020   Coronary artery disease of native artery of native heart with stable angina pectoris (HCC) 11/01/2020   Mild intermittent asthma without complication 11/01/2020   Moderate episode of recurrent major depressive disorder (HCC) 11/01/2020   Psoriasis 11/01/2020   Uncomplicated opioid dependence (HCC) 11/01/2020   Primary osteoarthritis involving multiple joints 11/01/2020   Degenerative lumbar disc 11/01/2020   Mixed hyperlipidemia 06/28/2020   Malaise and fatigue 06/28/2020   Primary osteoarthritis of left knee 09/06/2016   Pneumonia 08/2016   History of arthroplasty of right knee 10/20/2015   Sepsis, unspecified organism (HCC) 10/20/2015   Primary osteoarthritis of right knee 09/30/2015   Morbid obesity (HCC) 08/23/2015   Essential (primary) hypertension 08/23/2015   Degeneration of intervertebral disc of lumbosacral region 08/23/2015   Generalized abdominal pain 08/23/2015   Class 2  severe obesity due to excess calories with serious comorbidity and body mass index (BMI) of 38.0 to 38.9 in adult [facility]) 08/23/2015   Type 2 diabetes mellitus without complications (HCC) 08/23/2015   Pain in lower limb 12/07/2013   Plantar fasciitis of left foot 11/01/2013   Metatarsal deformity 11/01/2013   Diabetes (HCC) 11/01/2013   Type 2 diabetes mellitus without complication, with long-term current use of  insulin (HCC) 11/01/2013   DEHYDRATION 01/26/2009   DYSTHYMIC DISORDER 01/26/2009   ATHEROSLERO NATV ART EXTREM W/INTERMIT CLAUDICAT 01/26/2009   CLAUDICATION 01/26/2009   DYSPNEA 01/26/2009   CHEST PAIN UNSPECIFIED 01/26/2009   Past Medical History:  Diagnosis Date   Anemia of unknown etiology    Anxiety disorder    Arthritis    ATHEROSLERO NATV ART EXTREM W/INTERMIT CLAUDICAT 01/26/2009   Qualifier: Diagnosis of  By: Valinda Party RN, Nancy     Back pain of lumbar region with sciatica 01/31/2021   Benign prostatic hyperplasia with urinary frequency    CHEST PAIN UNSPECIFIED 01/26/2009   Qualifier: Diagnosis of  By: Valinda Party RN, Harriett Sine     Class 2 severe obesity due to excess calories with serious comorbidity and body mass index (BMI) of 38.0 to 38.9 in adult [facility])    CLAUDICATION 01/26/2009   Qualifier: Diagnosis of  By: Valinda Party RN, Harriett Sine     Coronary artery disease    minimal CAD '12; NL stress test 06/10/14 ([facility])   Coronary artery disease of native artery of native heart with stable angina pectoris (HCC)    Degeneration of intervertebral disc of lumbosacral region 08/23/2015   Degenerative disc disease, lumbar    Degenerative lumbar disc 11/01/2020   Formatting of this note might be different from the original. Was recommended back surgery. Holding out.   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   Qualifier: Diagnosis of  By: Valinda Party RN, Nancy     Elevated alkaline phosphatase level 11/27/2020   Epididymoorchitis 12/31/2020   Essential (primary) hypertension 08/23/2015   Family history of adverse reaction to anesthesia    "it made my son sick"   Generalized abdominal pain 08/23/2015   GERD (gastroesophageal reflux disease)    History of arthroplasty of right knee     Hyperlipemia    Iron deficiency anemia due to chronic blood loss 11/07/2020   Metatarsal deformity 11/01/2013   Mild intermittent asthma without complication    Mixed hyperlipidemia    Moderate episode of recurrent major depressive disorder (HCC)    Morbid obesity (HCC) 08/23/2015   Myocardial infarction (HCC)    2012; hospitalized [facility] for chest pain syndrome 02/2011 and LHC showed trivial CAD   Pain in lower limb 12/07/2013   Plantar fasciitis of left foot 11/01/2013   Pneumonia 08/2016   surgery had to be rescheduled due to pneumonia   Primary localized osteoarthritis of left knee 10/14/2016   Primary osteoarthritis involving multiple joints 11/01/2020   Formatting of this note might be different from the original. both knees replaced.   Primary osteoarthritis of left knee 09/06/2016   Primary osteoarthritis of right knee 09/30/2015   Psoriasis    Sepsis, unspecified organism (HCC) 10/20/2015   Type 2 diabetes mellitus without complication, with long-term current use of insulin (  HCC) 11/01/2013   Type 2 diabetes mellitus without complications (HCC) 08/23/2015   Uncomplicated opioid dependence (HCC)    Vitamin B12 deficiency 11/02/2020    Family History  Problem Relation Age of Onset   Cancer Mother    Diabetes Mother    Hyperlipidemia Father    Hypertension Father    Heart failure Father    Heart failure Brother     Past Surgical History:  Procedure Laterality Date   AMPUTATION Right 11/15/2022   Procedure: RIGHT TRANSMETATARSAL AMPUTATION;  Surgeon: Nadara Mustard, MD;  Location: [facility] OR;  Service: Orthopedics;  Laterality: Right;   CARDIAC CATHETERIZATION     03/28/11 LHC: NL LM, LAD; 10% mCX, mRCA. EF 65%. (Dr. Dot Been, [facility])   CHOLECYSTECTOMY     CORONARY ANGIOPLASTY     [facility]    ESOPHAGEAL DILATION     SEVERAL TIMES   Heel Spur Resection with Fasiotomy Left 11/25/2013   @ [facility]   KNEE ARTHROSCOPY Bilateral    SHOULDER ARTHROSCOPY WITH OPEN ROTATOR CUFF REPAIR Right     TOTAL KNEE ARTHROPLASTY Right 10/04/2015   Procedure: RIGHT TOTAL KNEE ARTHROPLASTY;  Surgeon: Gean Birchwood, MD;  Location: [facility] OR;  Service: Orthopedics;  Laterality: Right;   TOTAL KNEE ARTHROPLASTY Left 10/14/2016   TOTAL KNEE ARTHROPLASTY Left 10/14/2016   Procedure: LEFT TOTAL KNEE ARTHROPLASTY;  Surgeon: Gean Birchwood, MD;  Location: [facility] OR;  Service: Orthopedics;  Laterality: Left;   TUMOR REMOVAL     FATTY TUMOR  RT SHOULDER   Social History   Occupational History   Not on file  Tobacco Use   Smoking status: Never   Smokeless tobacco: Never  Vaping Use   Vaping Use: Never used  Substance and Sexual Activity   Alcohol use: No   Drug use: No   Sexual activity: Not on file

## 2022-12-09 ENCOUNTER — Ambulatory Visit (INDEPENDENT_AMBULATORY_CARE_PROVIDER_SITE_OTHER): Payer: Medicare Other | Admitting: Orthopedic Surgery

## 2022-12-09 ENCOUNTER — Encounter: Payer: Self-pay | Admitting: Orthopedic Surgery

## 2022-12-09 DIAGNOSIS — Z89431 Acquired absence of right foot: Secondary | ICD-10-CM

## 2022-12-09 MED ORDER — HYDROCODONE-ACETAMINOPHEN 10-325 MG PO TABS: 1.0000 | ORAL_TABLET | Freq: Four times a day (QID) | ORAL | 0 refills | Status: DC | PRN

## 2022-12-09 MED ORDER — SULFAMETHOXAZOLE-TRIMETHOPRIM 800-160 MG PO TABS: 1.0000 | ORAL_TABLET | Freq: Two times a day (BID) | ORAL | 0 refills | Status: DC

## 2022-12-09 NOTE — Progress Notes (Signed)
Office Visit Note   Patient: Kyle [name]           Date of Birth: [birth year]           MRN: [id number] Visit Date: 12/09/2022              Requested by: [name], MD [address] [place],  [place] [address] PCP: [name], MD  Chief Complaint  Patient presents with   Right Foot - Routine Post Op    11/15/22 right transmet amputation      HPI: Patient is a 62 year old gentleman status post transmetatarsal amputation.  Patient's cultures in February showed MRSA which was sensitive to doxycycline patient has completed a course of doxycycline but still has cellulitis over the dorsal wound flap.  Assessment & Plan: Visit Diagnoses:  1. History of transmetatarsal amputation of right foot (HCC)     Plan: Will send in a prescription for Septra DS the MRSA cultures were sensitive to Septra DS as well.  Will also send a prescription for Vicodin.  Follow-up in 1 week.  Discussed that we may need to proceed with revision surgery.  Follow-Up Instructions: Return in about 1 week (around 12/16/2022).   Ortho Exam  Patient is alert, oriented, no adenopathy, well-dressed, normal affect, normal respiratory effort. Examination patient has cellulitis on the dorsal flap.  There is slight gaping of the wound but no wound dehiscence no drainage.  He has a strong palpable dorsalis pedis pulse.  Imaging: No results found.   Labs: Lab Results  Component Value Date   HGBA1C 7.0 (H) 11/12/2022   HGBA1C 9.2 (H) 10/14/2016   HGBA1C 8.8 (H) 09/03/2016   ESRSEDRATE 79 (H) 11/11/2022   CRP 18.4 (H) 11/11/2022   REPTSTATUS 11/16/2022 FINAL 11/11/2022   CULT  11/11/2022    NO GROWTH 5 DAYS Performed at [facility], [address] [place], [place] [address]      Lab Results  Component Value Date   ALBUMIN 3.4 (L) 11/11/2022    Lab Results  Component Value Date   MG 1.8 11/15/2022   MG 1.6 (L) 11/14/2022   No results found for: "VD25OH"  No results found for:  "PREALBUMIN"    Latest Ref Rng & Units 11/13/2022    1:06 AM 11/12/2022    2:53 AM 11/11/2022    6:00 PM  CBC EXTENDED  WBC 4.0 - 10.5 K/uL 8.4  11.1  13.1   RBC 4.22 - 5.81 MIL/uL 3.72  3.39  4.01   Hemoglobin 13.0 - 17.0 g/dL 78.4  9.2  69.6   HCT 29.5 - 52.0 % 32.1  29.3  35.5   Platelets 150 - 400 K/uL 320  294  361   NEUT# 1.7 - 7.7 K/uL 4.7   9.6   Lymph# 0.7 - 4.0 K/uL 2.4   1.9      There is no height or weight on file to calculate BMI.  Orders:  No orders of the defined types were placed in this encounter.  No orders of the defined types were placed in this encounter.    Procedures: No procedures performed  Clinical Data: No additional findings.  ROS:  All other systems negative, except as noted in the HPI. Review of Systems  Objective: Vital Signs: There were no vitals taken for this visit.  Specialty Comments:  No specialty comments available.  PMFS History: Patient Active Problem List   Diagnosis Date Noted   Gangrene of toe  of right foot (HCC) 11/11/2022   AKI (acute kidney injury) (HCC) 11/11/2022   Type 2 diabetes mellitus with hyperglycemia, with long-term current use of insulin (HCC) 04/03/2022   Type 2 diabetes mellitus with diabetic polyneuropathy, with long-term current use of insulin (HCC) 04/03/2022   Diabetes mellitus (HCC) 04/03/2022   Colonic mass 03/04/2022   Skin ulcer of right great toe (HCC) 01/07/2022   Primary insomnia 12/19/2021   Noncompliance 05/30/2021   Back pain of lumbar region with sciatica 01/31/2021   Epididymoorchitis 12/31/2020   Anemia of unknown etiology    Arthritis    Coronary artery disease    Degenerative disc disease, lumbar    Depression    Diabetes mellitus without complication (HCC)    GERD (gastroesophageal reflux disease)    Family history of adverse reaction to anesthesia    Hyperlipemia    Myocardial infarction (HCC)    Elevated alkaline phosphatase level 11/27/2020   Iron deficiency anemia due to  chronic blood loss 11/07/2020   Vitamin B12 deficiency 11/02/2020   Anxiety disorder 11/01/2020   Benign prostatic hyperplasia with urinary frequency 11/01/2020   Coronary artery disease of native artery of native heart with stable angina pectoris (HCC) 11/01/2020   Mild intermittent asthma without complication 11/01/2020   Moderate episode of recurrent major depressive disorder (HCC) 11/01/2020   Psoriasis 11/01/2020   Uncomplicated opioid dependence (HCC) 11/01/2020   Primary osteoarthritis involving multiple joints 11/01/2020   Degenerative lumbar disc 11/01/2020   Mixed hyperlipidemia 06/28/2020   Malaise and fatigue 06/28/2020   Primary osteoarthritis of left knee 09/06/2016   Pneumonia 08/2016   History of arthroplasty of right knee 10/20/2015   Sepsis, unspecified organism (HCC) 10/20/2015   Primary osteoarthritis of right knee 09/30/2015   Morbid obesity (HCC) 08/23/2015   Essential (primary) hypertension 08/23/2015   Degeneration of intervertebral disc of lumbosacral region 08/23/2015   Generalized abdominal pain 08/23/2015   Class 2 severe obesity due to excess calories with serious comorbidity and body mass index (BMI) of 38.0 to 38.9 in adult (HCC) 08/23/2015   Type 2 diabetes mellitus without complications (HCC) 08/23/2015   Pain in lower limb 12/07/2013   Plantar fasciitis of left foot 11/01/2013   Metatarsal deformity 11/01/2013   Diabetes (HCC) 11/01/2013   Type 2 diabetes mellitus without complication, with long-term current use of insulin (HCC) 11/01/2013   DEHYDRATION 01/26/2009   DYSTHYMIC DISORDER 01/26/2009   ATHEROSLERO NATV ART EXTREM W/INTERMIT CLAUDICAT 01/26/2009   CLAUDICATION 01/26/2009   DYSPNEA 01/26/2009   CHEST PAIN UNSPECIFIED 01/26/2009   Past Medical History:  Diagnosis Date   Anemia of unknown etiology    Anxiety disorder    Arthritis    ATHEROSLERO NATV ART EXTREM W/INTERMIT CLAUDICAT 01/26/2009   Qualifier: Diagnosis of  By: Valinda Party RN,  Nancy     Back pain of lumbar region with sciatica 01/31/2021   Benign prostatic hyperplasia with urinary frequency    CHEST PAIN UNSPECIFIED 01/26/2009   Qualifier: Diagnosis of  By: Valinda Party RN, Nancy     Class 2 severe obesity due to excess calories with serious comorbidity and body mass index (BMI) of 38.0 to 38.9 in adult [facility])    CLAUDICATION 01/26/2009   Qualifier: Diagnosis of  By: Valinda Party RN, Harriett Sine     Coronary artery disease    minimal CAD '12; NL stress test 06/10/14 ([facility])   Coronary artery disease of native artery of native heart with stable angina pectoris (HCC)    Degeneration of intervertebral  disc of lumbosacral region 08/23/2015   Degenerative disc disease, lumbar    Degenerative lumbar disc 11/01/2020   Formatting of this note might be different from the original. Was recommended back surgery. Holding out.   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   Qualifier: Diagnosis of  By: Valinda Party RN, Nancy     Elevated alkaline phosphatase level 11/27/2020   Epididymoorchitis 12/31/2020   Essential (primary) hypertension 08/23/2015   Family history of adverse reaction to anesthesia    "it made my son sick"   Generalized abdominal pain 08/23/2015   GERD (gastroesophageal reflux disease)    History of arthroplasty of right knee    Hyperlipemia    Iron deficiency anemia due to chronic blood loss 11/07/2020   Metatarsal deformity 11/01/2013   Mild intermittent asthma without complication    Mixed hyperlipidemia    Moderate episode of recurrent major depressive disorder (HCC)    Morbid obesity (HCC) 08/23/2015   Myocardial infarction (HCC)    2012; hospitalized [facility] for chest pain syndrome 02/2011 and LHC showed trivial CAD   Pain in lower limb 12/07/2013   Plantar fasciitis  of left foot 11/01/2013   Pneumonia 08/2016   surgery had to be rescheduled due to pneumonia   Primary localized osteoarthritis of left knee 10/14/2016   Primary osteoarthritis involving multiple joints 11/01/2020   Formatting of this note might be different from the original. both knees replaced.   Primary osteoarthritis of left knee 09/06/2016   Primary osteoarthritis of right knee 09/30/2015   Psoriasis    Sepsis, unspecified organism (HCC) 10/20/2015   Type 2 diabetes mellitus without complication, with long-term current use of insulin (HCC) 11/01/2013   Type 2 diabetes mellitus without complications (HCC) 08/23/2015   Uncomplicated opioid dependence (HCC)    Vitamin B12 deficiency 11/02/2020    Family History  Problem Relation Age of Onset   Cancer Mother    Diabetes Mother    Hyperlipidemia Father    Hypertension Father    Heart failure Father    Heart failure Brother     Past Surgical History:  Procedure Laterality Date   AMPUTATION Right 11/15/2022   Procedure: RIGHT TRANSMETATARSAL AMPUTATION;  Surgeon: Nadara Mustard, MD;  Location: [facility] OR;  Service: Orthopedics;  Laterality: Right;   CARDIAC CATHETERIZATION     03/28/11 LHC: NL LM, LAD; 10% mCX, mRCA. EF 65%. (Dr. Dot Been, [facility])   CHOLECYSTECTOMY     CORONARY ANGIOPLASTY     2012 [facility]    ESOPHAGEAL DILATION     SEVERAL TIMES   Heel Spur Resection with Fasiotomy Left 11/25/2013   @ [facility]   KNEE ARTHROSCOPY Bilateral    SHOULDER ARTHROSCOPY WITH OPEN ROTATOR CUFF REPAIR Right    TOTAL KNEE ARTHROPLASTY Right 10/04/2015   Procedure: RIGHT TOTAL KNEE ARTHROPLASTY;  Surgeon: Gean Birchwood, MD;  Location: [facility] OR;  Service: Orthopedics;  Laterality: Right;   TOTAL KNEE ARTHROPLASTY Left 10/14/2016   TOTAL KNEE ARTHROPLASTY Left 10/14/2016   Procedure: LEFT TOTAL KNEE ARTHROPLASTY;  Surgeon: Gean Birchwood, MD;  Location: [facility] OR;  Service: Orthopedics;  Laterality: Left;   TUMOR REMOVAL     FATTY TUMOR  RT SHOULDER   Social History  Occupational History   Not on file  Tobacco Use   Smoking status: Never   Smokeless tobacco: Never  Vaping Use   Vaping Use: Never used  Substance and Sexual Activity   Alcohol use: No   Drug use: No   Sexual activity: Not on file

## 2022-12-16 ENCOUNTER — Encounter: Payer: Self-pay | Admitting: Orthopedic Surgery

## 2022-12-16 ENCOUNTER — Ambulatory Visit (INDEPENDENT_AMBULATORY_CARE_PROVIDER_SITE_OTHER): Payer: Medicare Other | Admitting: Orthopedic Surgery

## 2022-12-16 DIAGNOSIS — T8781 Dehiscence of amputation stump: Secondary | ICD-10-CM

## 2022-12-16 NOTE — Progress Notes (Signed)
Office Visit Note   Patient: Kyle [name]           Date of Birth: Oct 01, 1960           MRN: [id number] Visit Date: 12/16/2022              Requested by: [name], MD [address] [place],  [place] [address] PCP: [name], MD  Chief Complaint  Patient presents with   Right Foot - Routine Post Op    11/15/22 right transmet amputation      HPI: Patient is a 62 year old gentleman who is 4 weeks status post right transmetatarsal amputation patient has been on Septra DS for MRSA.  Assessment & Plan: Visit Diagnoses: No diagnosis found.  Plan: Patient has progressive wound dehiscence.  Will plan for show part amputation on Friday.  Possible outpatient surgery.  Follow-Up Instructions: Return in about 2 weeks (around 12/30/2022).   Ortho Exam  Patient is alert, oriented, no adenopathy, well-dressed, normal affect, normal respiratory effort. Examination patient has a strong dorsalis pedis pulse.  He has dehiscence of the medial aspect of the transmetatarsal amputation there is dermatitis but no cellulitis no purulent drainage.  The ulcer probes to bone.  Imaging: No results found.    Labs: Lab Results  Component Value Date   HGBA1C 7.0 (H) 11/12/2022   HGBA1C 9.2 (H) 10/14/2016   HGBA1C 8.8 (H) 09/03/2016   ESRSEDRATE 79 (H) 11/11/2022   CRP 18.4 (H) 11/11/2022   REPTSTATUS 11/16/2022 FINAL 11/11/2022   CULT  11/11/2022    NO GROWTH 5 DAYS Performed at [facility], [address], [place], [place] [address]      Lab Results  Component Value Date   ALBUMIN 3.4 (L) 11/11/2022    Lab Results  Component Value Date   MG 1.8 11/15/2022   MG 1.6 (L) 11/14/2022   No results found for: "VD25OH"  No results found for: "PREALBUMIN"    Latest Ref Rng & Units 11/13/2022    1:06 AM 11/12/2022    2:53 AM 11/11/2022    6:00 PM  CBC EXTENDED  WBC 4.0 - 10.5 K/uL 8.4  11.1  13.1   RBC 4.22 - 5.81 MIL/uL 3.72  3.39  4.01   Hemoglobin 13.0 - 17.0 g/dL  82.9  9.2  56.2   HCT 39.0 - 52.0 % 32.1  29.3  35.5   Platelets 150 - 400 K/uL 320  294  361   NEUT# 1.7 - 7.7 K/uL 4.7   9.6   Lymph# 0.7 - 4.0 K/uL 2.4   1.9      There is no height or weight on file to calculate BMI.  Orders:  No orders of the defined types were placed in this encounter.  No orders of the defined types were placed in this encounter.    Procedures: No procedures performed  Clinical Data: No additional findings.  ROS:  All other systems negative, except as noted in the HPI. Review of Systems  Objective: Vital Signs: There were no vitals taken for this visit.  Specialty Comments:  No specialty comments available.  PMFS History: Patient Active Problem List   Diagnosis Date Noted   Gangrene of toe of right foot (HCC) 11/11/2022   AKI (acute kidney injury) (HCC) 11/11/2022   Status post partial colectomy 10/04/2022   Type 2 diabetes mellitus with hyperglycemia, with long-term current use of insulin (HCC) 04/03/2022   Type 2 diabetes mellitus with diabetic  polyneuropathy, with long-term current use of insulin (HCC) 04/03/2022   Diabetes mellitus (HCC) 04/03/2022   Colonic mass 03/04/2022   Skin ulcer of right great toe (HCC) 01/07/2022   Primary insomnia 12/19/2021   Noncompliance 05/30/2021   Back pain of lumbar region with sciatica 01/31/2021   Epididymoorchitis 12/31/2020   Anemia of unknown etiology    Arthritis    Coronary artery disease    Degenerative disc disease, lumbar    Depression    Diabetes mellitus without complication (HCC)    GERD (gastroesophageal reflux disease)    Family history of adverse reaction to anesthesia    Hyperlipemia    Myocardial infarction (HCC)    Elevated alkaline phosphatase level 11/27/2020   Iron deficiency anemia due to chronic blood loss 11/07/2020   Vitamin B12 deficiency 11/02/2020   Anxiety disorder 11/01/2020   Benign prostatic hyperplasia with urinary frequency 11/01/2020   Coronary artery  disease of native artery of native heart with stable angina pectoris (HCC) 11/01/2020   Mild intermittent asthma without complication 11/01/2020   Moderate episode of recurrent major depressive disorder (HCC) 11/01/2020   Psoriasis 11/01/2020   Uncomplicated opioid dependence (HCC) 11/01/2020   Primary osteoarthritis involving multiple joints 11/01/2020   Degenerative lumbar disc 11/01/2020   Mixed hyperlipidemia 06/28/2020   Malaise and fatigue 06/28/2020   Pneumonia 08/2016   History of arthroplasty of right knee 10/20/2015   Sepsis, unspecified organism (HCC) 10/20/2015   Primary osteoarthritis of right knee 09/30/2015   Morbid obesity (HCC) 08/23/2015   Essential (primary) hypertension 08/23/2015   Degeneration of intervertebral disc of lumbosacral region 08/23/2015   Generalized abdominal pain 08/23/2015   Class 2 severe obesity due to excess calories with serious comorbidity and body mass index (BMI) of 38.0 to 38.9 in adult (HCC) 08/23/2015   Type 2 diabetes mellitus without complications (HCC) 08/23/2015   Pain in lower limb 12/07/2013   Plantar fasciitis of left foot 11/01/2013   Metatarsal deformity 11/01/2013   Diabetes (HCC) 11/01/2013   Type 2 diabetes mellitus without complication, with long-term current use of insulin (HCC) 11/01/2013   DEHYDRATION 01/26/2009   DYSTHYMIC DISORDER 01/26/2009   ATHEROSLERO NATV ART EXTREM W/INTERMIT CLAUDICAT 01/26/2009   CLAUDICATION 01/26/2009   DYSPNEA 01/26/2009   CHEST PAIN UNSPECIFIED 01/26/2009   Past Medical History:  Diagnosis Date   AKI (acute kidney injury) (HCC) 11/11/2022   Anemia of unknown etiology    Anxiety disorder    Arthritis    ATHEROSLERO NATV ART EXTREM W/INTERMIT CLAUDICAT 01/26/2009   Qualifier: Diagnosis of  By: Valinda Party RN, Nancy     Back pain of lumbar region with sciatica 01/31/2021   Benign prostatic hyperplasia with urinary frequency    CHEST PAIN UNSPECIFIED 01/26/2009   Qualifier: Diagnosis of   By: Valinda Party RN, Nancy     Class 2 severe obesity due to excess calories with serious comorbidity and body mass index (BMI) of 38.0 to 38.9 in adult [facility])    CLAUDICATION 01/26/2009   Qualifier: Diagnosis of  By: Valinda Party RN, Nancy     Colonic mass 03/04/2022   Coronary artery disease    minimal CAD '12; NL stress test 06/10/14 ([facility])   Coronary artery disease of native artery of native heart with stable angina pectoris (HCC)    Degeneration of intervertebral disc of lumbosacral region 08/23/2015   Degenerative disc disease, lumbar    Degenerative lumbar disc 11/01/2020   Formatting of this note might be different from the original. Was recommended  back surgery. Holding out.   Dehydration 01/26/2009   Qualifier: Diagnosis of  By: Valinda Party RN, Nancy     Depression    Diabetes (HCC) 11/01/2013   Diabetes mellitus (HCC) 04/03/2022   Diabetes mellitus without complication (HCC)    DYSPNEA 01/26/2009   Qualifier: Diagnosis of  By: Valinda Party RN, Harriett Sine     Dysthymic disorder 01/26/2009   Qualifier: Diagnosis of  By: Valinda Party RN, Nancy     Elevated alkaline phosphatase level 11/27/2020   Epididymoorchitis 12/31/2020   Essential (primary) hypertension 08/23/2015   Family history of adverse reaction to anesthesia    "it made my son sick"   Gangrene of toe of right foot (HCC) 11/11/2022   Generalized abdominal pain 08/23/2015   GERD (gastroesophageal reflux disease)    History of arthroplasty of right knee    Hyperlipemia    Iron deficiency anemia due to chronic blood loss 11/07/2020   Malaise and fatigue 06/28/2020   Metatarsal deformity 11/01/2013   Mild intermittent asthma without complication    Mixed hyperlipidemia    Moderate episode of recurrent major depressive disorder (HCC)    Morbid obesity (HCC) 08/23/2015   Myocardial infarction (HCC)    2012; hospitalized [facility] for chest pain syndrome 02/2011 and LHC showed trivial CAD   Noncompliance 05/30/2021   Pain in lower limb 12/07/2013    Plantar fasciitis of left foot 11/01/2013   Pneumonia 08/2016   surgery had to be rescheduled due to pneumonia   Primary insomnia 12/19/2021   Formatting of this note might be different from the original.  Avoid Rx   Primary osteoarthritis involving multiple joints 11/01/2020   Formatting of this note might be different from the original. both knees replaced.   Primary osteoarthritis of left knee 09/06/2016   Primary osteoarthritis of right knee 09/30/2015   Psoriasis    Sepsis, unspecified organism (HCC) 10/20/2015   Skin ulcer of right great toe (HCC) 01/07/2022   Status post partial colectomy 10/04/2022   Type 2 diabetes mellitus with diabetic polyneuropathy, with long-term current use of insulin (HCC) 04/03/2022   Type 2 diabetes mellitus with hyperglycemia, with long-term current use of insulin (HCC) 04/03/2022   Type 2 diabetes mellitus without complication, with long-term current use of insulin (HCC) 11/01/2013   Type 2 diabetes mellitus without complications (HCC) 08/23/2015   Uncomplicated opioid dependence (HCC)    Vitamin B12 deficiency 11/02/2020    Family History  Problem Relation Age of Onset   Cancer Mother    Diabetes Mother    Hyperlipidemia Father    Hypertension Father    Heart failure Father    Heart failure Brother     Past Surgical History:  Procedure Laterality Date   AMPUTATION Right 11/15/2022   Procedure: RIGHT TRANSMETATARSAL AMPUTATION;  Surgeon: Nadara Mustard, MD;  Location: [facility] OR;  Service: Orthopedics;  Laterality: Right;   CARDIAC CATHETERIZATION     03/28/11 LHC: NL LM, LAD; 10% mCX, mRCA. EF 65%. (Dr. Dot Been, [facility])   CHOLECYSTECTOMY     CORONARY ANGIOPLASTY     [facility]    ESOPHAGEAL DILATION     SEVERAL TIMES   Heel Spur Resection with Fasiotomy Left 11/25/2013   @ [facility]   KNEE ARTHROSCOPY Bilateral    SHOULDER ARTHROSCOPY WITH OPEN ROTATOR CUFF REPAIR Right    TOTAL KNEE ARTHROPLASTY Right 10/04/2015   Procedure: RIGHT TOTAL  KNEE ARTHROPLASTY;  Surgeon: Gean Birchwood, MD;  Location: [facility] OR;  Service: Orthopedics;  Laterality:  Right;   TOTAL KNEE ARTHROPLASTY Left 10/14/2016   TOTAL KNEE ARTHROPLASTY Left 10/14/2016   Procedure: LEFT TOTAL KNEE ARTHROPLASTY;  Surgeon: Gean Birchwood, MD;  Location: [facility] OR;  Service: Orthopedics;  Laterality: Left;   TUMOR REMOVAL     FATTY TUMOR  RT SHOULDER   Social History   Occupational History   Not on file  Tobacco Use   Smoking status: Never   Smokeless tobacco: Never  Vaping Use   Vaping Use: Never used  Substance and Sexual Activity   Alcohol use: No   Drug use: No   Sexual activity: Not on file

## 2022-12-17 ENCOUNTER — Ambulatory Visit: Payer: Medicare Other | Attending: Cardiology | Admitting: Cardiology

## 2022-12-19 ENCOUNTER — Encounter (HOSPITAL_COMMUNITY): Payer: Self-pay | Admitting: Orthopedic Surgery

## 2022-12-19 ENCOUNTER — Telehealth: Payer: Self-pay | Admitting: Orthopedic Surgery

## 2022-12-19 ENCOUNTER — Other Ambulatory Visit: Payer: Self-pay

## 2022-12-19 NOTE — Progress Notes (Signed)
SDW call  Patient was given pre-op instructions over the phone. Patient verbalized understanding of instructions provided.     PCP - Dr. Feliciana Rossetti Cardiologist - Dr. Belva Crome Pulmonary:    PPM/ICD - Denies  Chest x-ray - 11/11/2022 EKG -  11/11/2022 Stress Test - ECHO - 02/25/2021 Cardiac Cath - 03/13/2022  Sleep Study/sleep apnea/CPAP: Denies  Type II diabetes.  No longer uses Dexcom G7 due to expenese Fasting Blood sugar range: 99-104 How often check sugars: 3-4 times per day Metformin, Instructed to hold DOS Glipizide, instructed to hold this evenings dose and the morning of surgery Insulin glargine (Toujeo solostar), instructed to use 28 units (50% of regular dose) in the morning Hemalog, instructed to take 12 units (usual dose) with supper this evening.  Do not take any units in the morning. If blood sugar is > 220 he can use 1/2 of his correction dose    Blood Thinner Instructions: Denies Aspirin Instructions: Continue   ERAS Protcol - Yes, clear liquids until 1100 PRE-SURGERY Ensure or G2-    COVID TEST- n/a    Anesthesia review: Yes. CAD, HTN, MI, DM, PVD, AKI, SOB   Patient denies shortness of breath, fever, cough and chest pain over the phone call  Your procedure is scheduled on Friday Dec 20, 2022   Report to [facility] Main Entrance "A" at 1130   A.M., then check in with the Admitting office.  Call this number if you have problems the morning of surgery:  [phone]   If you have any questions prior to your surgery date call [phone]: Open Monday-Friday 8am-4pm If you experience any cold or flu symptoms such as cough, fever, chills, shortness of breath, etc. between now and your scheduled surgery, please notify us at the above number    Remember:  Do not eat after midnight the night before your surgery  You may drink clear liquids until  1100   the morning of your surgery.   Clear liquids allowed are: Water, Non-Citrus Juices (without pulp),  Carbonated Beverages, Clear Tea, Black Coffee ONLY (NO MILK, CREAM OR POWDERED CREAMER of any kind), and Gatorade   Take these medicines the morning of surgery with A SIP OF WATER:  ASA, bupropion, gabapentin, metoprolol, omeprazole, paxil, crestor, bactrim  As needed: Albuterol, Norco, percocet  As of today, STOP taking any Aleve, Naproxen, Ibuprofen, Motrin, Advil, Goody's, BC's, all herbal medications, fish oil, and all vitamins.

## 2022-12-19 NOTE — Telephone Encounter (Signed)
I spoke with Kyle [name].  All surgery information given. Surgery is at [facility] tomorrow, advised he should arrive around 12pm or per pre op nurse's instructions.

## 2022-12-19 NOTE — Telephone Encounter (Signed)
Patient called asked what time is he suppose to be at [facility] tomorrow? The number to contact patient is [phone]

## 2022-12-20 ENCOUNTER — Inpatient Hospital Stay (HOSPITAL_COMMUNITY): Payer: Medicare Other | Admitting: Certified Registered"

## 2022-12-20 ENCOUNTER — Encounter (HOSPITAL_COMMUNITY): Payer: Self-pay | Admitting: Orthopedic Surgery

## 2022-12-20 ENCOUNTER — Ambulatory Visit (HOSPITAL_COMMUNITY)
Admission: RE | Admit: 2022-12-20 | Discharge: 2022-12-20 | Disposition: A | Discharge status: Home / Self Care | Payer: Medicare Other | Attending: Orthopedic Surgery | Admitting: Orthopedic Surgery

## 2022-12-20 ENCOUNTER — Encounter (HOSPITAL_COMMUNITY)
Admission: RE | Disposition: A | Discharge status: Home / Self Care | Payer: Self-pay | Source: Home / Self Care | Attending: Orthopedic Surgery

## 2022-12-20 DIAGNOSIS — I252 Old myocardial infarction: Secondary | ICD-10-CM | POA: Diagnosis not present

## 2022-12-20 DIAGNOSIS — D649 Anemia, unspecified: Secondary | ICD-10-CM | POA: Diagnosis not present

## 2022-12-20 DIAGNOSIS — I10 Essential (primary) hypertension: Secondary | ICD-10-CM

## 2022-12-20 DIAGNOSIS — Z7984 Long term (current) use of oral hypoglycemic drugs: Secondary | ICD-10-CM | POA: Diagnosis not present

## 2022-12-20 DIAGNOSIS — E1151 Type 2 diabetes mellitus with diabetic peripheral angiopathy without gangrene: Secondary | ICD-10-CM | POA: Insufficient documentation

## 2022-12-20 DIAGNOSIS — D759 Disease of blood and blood-forming organs, unspecified: Secondary | ICD-10-CM | POA: Diagnosis not present

## 2022-12-20 DIAGNOSIS — T8781 Dehiscence of amputation stump: Secondary | ICD-10-CM

## 2022-12-20 DIAGNOSIS — I2510 Atherosclerotic heart disease of native coronary artery without angina pectoris: Secondary | ICD-10-CM

## 2022-12-20 HISTORY — PX: AMPUTATION: SHX166

## 2022-12-20 HISTORY — DX: Dehiscence of amputation stump: T87.81

## 2022-12-20 LAB — GLUCOSE, CAPILLARY
Glucose-Capillary: 190 mg/dL — ABNORMAL HIGH (ref 70–99)
Glucose-Capillary: 163 mg/dL — ABNORMAL HIGH (ref 70–99)

## 2022-12-20 SURGERY — AMPUTATION, FOOT, RAY
Anesthesia: Monitor Anesthesia Care | Laterality: Right

## 2022-12-20 MED ORDER — 0.9 % SODIUM CHLORIDE (POUR BTL) OPTIME
TOPICAL | Status: DC | PRN
  Administered 2022-12-20: 1000 mL

## 2022-12-20 MED ORDER — MIDAZOLAM HCL 2 MG/2ML IJ SOLN
INTRAMUSCULAR | Status: AC
  Filled 2022-12-20: qty 2

## 2022-12-20 MED ORDER — CHLORHEXIDINE GLUCONATE 0.12 % MT SOLN
15.0000 mL | Freq: Once | OROMUCOSAL | Status: AC
  Administered 2022-12-20: 15 mL via OROMUCOSAL
  Filled 2022-12-20: qty 15

## 2022-12-20 MED ORDER — PROPOFOL 10 MG/ML IV BOLUS
INTRAVENOUS | Status: DC | PRN
  Administered 2022-12-20 (×8): 10 mg via INTRAVENOUS
  Administered 2022-12-20: 20 mg via INTRAVENOUS
  Administered 2022-12-20: 10 mg via INTRAVENOUS
  Administered 2022-12-20: 20 mg via INTRAVENOUS
  Administered 2022-12-20: 10 mg via INTRAVENOUS

## 2022-12-20 MED ORDER — CEFAZOLIN SODIUM-DEXTROSE 2-4 GM/100ML-% IV SOLN
INTRAVENOUS | Status: AC
  Filled 2022-12-20: qty 100

## 2022-12-20 MED ORDER — ONDANSETRON HCL 4 MG/2ML IJ SOLN
INTRAMUSCULAR | Status: DC | PRN
  Administered 2022-12-20: 4 mg via INTRAVENOUS

## 2022-12-20 MED ORDER — ORAL CARE MOUTH RINSE: 15.0000 mL | Freq: Once | OROMUCOSAL | Status: AC

## 2022-12-20 MED ORDER — INSULIN ASPART 100 UNIT/ML IJ SOLN: 0.0000 [IU] | INTRAMUSCULAR | Status: DC | PRN

## 2022-12-20 MED ORDER — LIDOCAINE HCL (PF) 1 % IJ SOLN
INTRAMUSCULAR | Status: DC | PRN
  Administered 2022-12-20: 30 mL

## 2022-12-20 MED ORDER — MIDAZOLAM HCL 5 MG/5ML IJ SOLN
INTRAMUSCULAR | Status: DC | PRN
  Administered 2022-12-20: 2 mg via INTRAVENOUS

## 2022-12-20 MED ORDER — LIDOCAINE HCL 1 % IJ SOLN
INTRAMUSCULAR | Status: AC
  Filled 2022-12-20: qty 20

## 2022-12-20 MED ORDER — FENTANYL CITRATE (PF) 100 MCG/2ML IJ SOLN
INTRAMUSCULAR | Status: AC
  Filled 2022-12-20: qty 2

## 2022-12-20 MED ORDER — LACTATED RINGERS IV SOLN: INTRAVENOUS | Status: DC

## 2022-12-20 MED ORDER — CEFAZOLIN SODIUM-DEXTROSE 2-4 GM/100ML-% IV SOLN
2.0000 g | INTRAVENOUS | Status: AC
  Administered 2022-12-20: 2 g via INTRAVENOUS

## 2022-12-20 MED ORDER — OXYCODONE-ACETAMINOPHEN 10-325 MG PO TABS: 1.0000 | ORAL_TABLET | ORAL | 0 refills | Status: DC | PRN

## 2022-12-20 MED ORDER — FENTANYL CITRATE (PF) 100 MCG/2ML IJ SOLN: 25.0000 ug | INTRAMUSCULAR | Status: DC | PRN

## 2022-12-20 MED ORDER — INSULIN ASPART 100 UNIT/ML IJ SOLN
INTRAMUSCULAR | Status: AC
  Administered 2022-12-20: 4 [IU] via SUBCUTANEOUS
  Filled 2022-12-20: qty 1

## 2022-12-20 MED ORDER — FENTANYL CITRATE (PF) 100 MCG/2ML IJ SOLN
INTRAMUSCULAR | Status: DC | PRN
  Administered 2022-12-20: 25 ug via INTRAVENOUS

## 2022-12-20 MED ORDER — ACETAMINOPHEN 10 MG/ML IV SOLN: 1000.0000 mg | Freq: Once | INTRAVENOUS | Status: DC | PRN

## 2022-12-20 SURGICAL SUPPLY — 36 items
BAG COUNTER SPONGE SURGICOUNT (BAG) ×1 IMPLANT
BAG SPNG CNTER NS LX DISP (BAG) ×1
BLADE SAW SGTL MED 73X18.5 STR (BLADE) IMPLANT
BLADE SURG 21 STRL SS (BLADE) ×1 IMPLANT
BNDG COHESIVE 4X5 TAN STRL (GAUZE/BANDAGES/DRESSINGS) ×1 IMPLANT
BNDG GAUZE DERMACEA FLUFF 4 (GAUZE/BANDAGES/DRESSINGS) ×1 IMPLANT
BNDG GZE DERMACEA 4 6PLY (GAUZE/BANDAGES/DRESSINGS)
CANISTER PREVENA PLUS 150 (CANNISTER) IMPLANT
CANISTER WOUNDNEG PRESSURE 500 (CANNISTER) IMPLANT
COVER SURGICAL LIGHT HANDLE (MISCELLANEOUS) ×2 IMPLANT
DRAPE DERMATAC (DRAPES) IMPLANT
DRAPE U-SHAPE 47X51 STRL (DRAPES) ×2 IMPLANT
DRESSING PEEL AND PLC PRVNA 13 (GAUZE/BANDAGES/DRESSINGS) IMPLANT
DRSG ADAPTIC 3X8 NADH LF (GAUZE/BANDAGES/DRESSINGS) ×1 IMPLANT
DRSG PEEL AND PLACE PREVENA 13 (GAUZE/BANDAGES/DRESSINGS) ×1
DURAPREP 26ML APPLICATOR (WOUND CARE) ×1 IMPLANT
ELECT REM PT RETURN 9FT ADLT (ELECTROSURGICAL) ×1
ELECTRODE REM PT RTRN 9FT ADLT (ELECTROSURGICAL) ×1 IMPLANT
GAUZE PAD ABD 8X10 STRL (GAUZE/BANDAGES/DRESSINGS) ×2 IMPLANT
GAUZE SPONGE 4X4 12PLY STRL (GAUZE/BANDAGES/DRESSINGS) ×1 IMPLANT
GLOVE BIOGEL PI IND STRL 9 (GLOVE) ×1 IMPLANT
GLOVE SURG ORTHO 9.0 STRL STRW (GLOVE) ×1 IMPLANT
GOWN STRL REUS W/ TWL XL LVL3 (GOWN DISPOSABLE) ×2 IMPLANT
GOWN STRL REUS W/TWL XL LVL3 (GOWN DISPOSABLE) ×2
GRAFT SKIN WND MICRO 38 (Tissue) IMPLANT
KIT BASIN OR (CUSTOM PROCEDURE TRAY) ×1 IMPLANT
KIT PUMP PREVENA PLUS 14DAY (MISCELLANEOUS) IMPLANT
KIT TURNOVER KIT B (KITS) ×1 IMPLANT
NS IRRIG 1000ML POUR BTL (IV SOLUTION) ×1 IMPLANT
PACK ORTHO EXTREMITY (CUSTOM PROCEDURE TRAY) ×1 IMPLANT
PAD ARMBOARD 7.5X6 YLW CONV (MISCELLANEOUS) ×2 IMPLANT
STOCKINETTE IMPERVIOUS LG (DRAPES) IMPLANT
SUT ETHILON 2 0 PSLX (SUTURE) ×1 IMPLANT
TOWEL GREEN STERILE (TOWEL DISPOSABLE) ×1 IMPLANT
TUBE CONNECTING 12X1/4 (SUCTIONS) ×1 IMPLANT
YANKAUER SUCT BULB TIP NO VENT (SUCTIONS) ×1 IMPLANT

## 2022-12-20 NOTE — Anesthesia Postprocedure Evaluation (Signed)
Anesthesia Post Note  Patient: Kyle [name]  Procedure(s) Performed: RIGHT CHOPART AMPUTATION (Right)     Patient location during evaluation: PACU Anesthesia Type: MAC Level of consciousness: awake and alert Pain management: pain level controlled Vital Signs Assessment: post-procedure vital signs reviewed and stable Respiratory status: spontaneous breathing, nonlabored ventilation, respiratory function stable and patient connected to nasal cannula oxygen Cardiovascular status: stable and blood pressure returned to baseline Postop Assessment: no apparent nausea or vomiting Anesthetic complications: no   No notable events documented.  Last Vitals:  Vitals:   12/20/22 1415 12/20/22 1420  BP: (!) 107/94 116/67  Pulse: 77 80  Resp: 14 20  Temp:  36.7 C  SpO2: 94% 92%    Last Pain:  Vitals:   12/20/22 1420  TempSrc:   PainSc: 0-No pain                 Earl Lites P Krishawn Vanderweele

## 2022-12-20 NOTE — Op Note (Signed)
12/20/2022  2:01 PM  PATIENT:  Kyle [name]    PRE-OPERATIVE DIAGNOSIS:  Dehiscence Right Transmetatarsal Amputation  POST-OPERATIVE DIAGNOSIS:  Same  PROCEDURE: Debridement dehiscence right transmetatarsal amputation.  With excision skin and soft tissue muscle and bone. Application Kerecis tissue graft 38 cm. Application Prevena 13 cm wound VAC dressing.  SURGEON:  Nadara Mustard, MD  PHYSICIAN ASSISTANT:None ANESTHESIA:   General  PREOPERATIVE INDICATIONS:  Kyle [name] is a  62 y.o. male with a diagnosis of Dehiscence Right Transmetatarsal Amputation who failed conservative measures and elected for surgical management.    The risks benefits and alternatives were discussed with the patient preoperatively including but not limited to the risks of infection, bleeding, nerve injury, cardiopulmonary complications, the need for revision surgery, among others, and the patient was willing to proceed.  OPERATIVE IMPLANTS:   Implant Name Type Inv. Item Serial No. Manufacturer Lot No. LRB No. Used Action  GRAFT SKIN WND MICRO 38 - [id number] Tissue GRAFT SKIN WND MICRO 38  [employer] [id number] Right 1 Implanted    @ENCIMAGES @  OPERATIVE FINDINGS: Wound margins were healthy and viable and clear.  OPERATIVE PROCEDURE: Patient was brought the operating room and underwent a MAC anesthetic.  The right lower extremity was then prepped using DuraPrep draped into a sterile field a timeout was called.  Patient underwent an ankle block with 20 cc of 1% lidocaine plain.  After adequate levels anesthesia were obtained a fishmouth incision was made proximal to the area of wound dehiscence.  Skin and soft tissue muscle and fascia was excised as well as the distal centimeter of the metatarsals.  Tissue resected was nonviable no purulence no necrosis.  No abscess.  Electrocautery was used hemostasis the wound was irrigated with normal saline.  The wound was filled with 38 cm of Kerecis micro graft.  The  incision was closed using 2-0 nylon a Prevena wound VAC was applied 13 cm this had a good suction fit this was overwrapped with Coban patient was taken the PACU in stable condition.   DISCHARGE PLANNING:  Antibiotic duration: Preoperative antibiotics  Weightbearing: Touchdown weightbearing on the right  Pain medication: Prescription Percocet 10 mg  Dressing care/ Wound VAC: Wound VAC continue for 1 week  Ambulatory devices: Walker  Discharge to: Home.  Follow-up: In the office 1 week post operative.

## 2022-12-20 NOTE — H&P (Signed)
Kyle [name] is an 62 y.o. male.   Chief Complaint: Dehiscence right transmetatarsal amputation. HPI: Patient is a 62 year old gentleman status post foot salvage intervention for his right foot.  Patient has had progressive wound dehiscence with cellulitis drainage and necrotic soft tissue.  Past Medical History:  Diagnosis Date   AKI (acute kidney injury) (HCC) 11/11/2022   Anemia of unknown etiology    Anxiety disorder    Arthritis    ATHEROSLERO NATV ART EXTREM W/INTERMIT CLAUDICAT 01/26/2009   Qualifier: Diagnosis of  By: Valinda Party RN, Nancy     Back pain of lumbar region with sciatica 01/31/2021   Benign prostatic hyperplasia with urinary frequency    CHEST PAIN UNSPECIFIED 01/26/2009   Qualifier: Diagnosis of  By: Valinda Party RN, Nancy     Class 2 severe obesity due to excess calories with serious comorbidity and body mass index (BMI) of 38.0 to 38.9 in adult [facility])    CLAUDICATION 01/26/2009   Qualifier: Diagnosis of  By: Valinda Party RN, Nancy     Colonic mass 03/04/2022   Coronary artery disease    minimal CAD '12; NL stress test 06/10/14 ([facility])   Coronary artery disease of native artery of native heart with stable angina pectoris (HCC)    Degeneration of intervertebral disc of lumbosacral region 08/23/2015   Degenerative disc disease, lumbar    Degenerative lumbar disc 11/01/2020   Formatting of this note might be different from the original. Was recommended back surgery. Holding out.   Dehydration 01/26/2009   Qualifier: Diagnosis of  By: Valinda Party RN, Nancy     Depression    Diabetes (HCC) 11/01/2013   Diabetes mellitus (HCC) 04/03/2022   Diabetes mellitus without complication (HCC)    DYSPNEA 01/26/2009   Qualifier: Diagnosis of  By: Valinda Party RN, Harriett Sine     Dysthymic disorder 01/26/2009   Qualifier: Diagnosis of  By: Valinda Party RN, Nancy     Elevated alkaline phosphatase level 11/27/2020   Epididymoorchitis 12/31/2020   Essential (primary) hypertension 08/23/2015   Family  history of adverse reaction to anesthesia    "it made my son sick"   Gangrene of toe of right foot (HCC) 11/11/2022   Generalized abdominal pain 08/23/2015   GERD (gastroesophageal reflux disease)    History of arthroplasty of right knee    Hyperlipemia    Iron deficiency anemia due to chronic blood loss 11/07/2020   Malaise and fatigue 06/28/2020   Metatarsal deformity 11/01/2013   Mild intermittent asthma without complication    Mixed hyperlipidemia    Moderate episode of recurrent major depressive disorder (HCC)    Morbid obesity (HCC) 08/23/2015   Myocardial infarction (HCC)    2012; hospitalized [facility] for chest pain syndrome 02/2011 and LHC showed trivial CAD   Noncompliance 05/30/2021   Pain in lower limb 12/07/2013   Plantar fasciitis of left foot 11/01/2013   Pneumonia 08/2016   surgery had to be rescheduled due to pneumonia   Primary insomnia 12/19/2021   Formatting of this note might be different from the original.  Avoid Rx   Primary osteoarthritis involving multiple joints 11/01/2020   Formatting of this note might be different from the original. both knees replaced.   Primary osteoarthritis of left knee 09/06/2016   Primary osteoarthritis of right knee 09/30/2015   Psoriasis    Sepsis, unspecified organism (HCC) 10/20/2015   Skin ulcer of right great toe (HCC) 01/07/2022   Status post partial colectomy 10/04/2022   Type 2 diabetes mellitus with diabetic polyneuropathy, with  long-term current use of insulin (HCC) 04/03/2022   Type 2 diabetes mellitus with hyperglycemia, with long-term current use of insulin (HCC) 04/03/2022   Type 2 diabetes mellitus without complication, with long-term current use of insulin (HCC) 11/01/2013   Type 2 diabetes mellitus without complications (HCC) 08/23/2015   Uncomplicated opioid dependence (HCC)    Vitamin B12 deficiency 11/02/2020    Past Surgical History:  Procedure Laterality Date   AMPUTATION Right 11/15/2022   Procedure: RIGHT  TRANSMETATARSAL AMPUTATION;  Surgeon: Nadara Mustard, MD;  Location: [facility] OR;  Service: Orthopedics;  Laterality: Right;   CARDIAC CATHETERIZATION     03/28/11 LHC: NL LM, LAD; 10% mCX, mRCA. EF 65%. (Dr. Dot Been, [facility])   CHOLECYSTECTOMY     CORONARY ANGIOPLASTY     2012 [facility]    ESOPHAGEAL DILATION     SEVERAL TIMES   Heel Spur Resection with Fasiotomy Left 11/25/2013   @ [facility]   KNEE ARTHROSCOPY Bilateral    SHOULDER ARTHROSCOPY WITH OPEN ROTATOR CUFF REPAIR Right    TOTAL KNEE ARTHROPLASTY Right 10/04/2015   Procedure: RIGHT TOTAL KNEE ARTHROPLASTY;  Surgeon: Gean Birchwood, MD;  Location: [facility] OR;  Service: Orthopedics;  Laterality: Right;   TOTAL KNEE ARTHROPLASTY Left 10/14/2016   TOTAL KNEE ARTHROPLASTY Left 10/14/2016   Procedure: LEFT TOTAL KNEE ARTHROPLASTY;  Surgeon: Gean Birchwood, MD;  Location: [facility] OR;  Service: Orthopedics;  Laterality: Left;   TUMOR REMOVAL     FATTY TUMOR  RT SHOULDER    Family History  Problem Relation Age of Onset   Cancer Mother    Diabetes Mother    Hyperlipidemia Father    Hypertension Father    Heart failure Father    Heart failure Brother    Social History:  reports that he has never smoked. He has never used smokeless tobacco. He reports that he does not drink alcohol and does not use drugs.  Allergies:  Allergies  Allergen Reactions   Penicillins Hives, Swelling and Rash    Has patient had a PCN reaction causing immediate rash, facial/tongue/throat swelling, SOB or lightheadedness with hypotension: No Has patient had a PCN reaction causing severe rash involving mucus membranes or skin necrosis: No Has patient had a PCN reaction that required hospitalization No Has patient had a PCN reaction occurring within the last 10 years: No If all of the above answers are "NO", then may proceed with Cephalosporin use.    Cymbalta [Duloxetine Hcl] Other (See Comments)    "makes me crazy" depression    No medications prior to admission.    No  results found for this or any previous visit (from the past 48 hour(s)). No results found.  Review of Systems  All other systems reviewed and are negative.   There were no vitals taken for this visit. Physical Exam  Patient is alert, oriented, no adenopathy, well-dressed, normal affect, normal respiratory effort. Examination patient has a strong dorsalis pedis pulse.  He has dehiscence of the medial aspect of the transmetatarsal amputation there is dermatitis but no cellulitis no purulent drainage.  The ulcer probes to bone. Assessment/Plan Assessment: Dehiscence right transmetatarsal amputation.  Plan: Will plan for a chopart amputation.  Risk and benefits were discussed including risk of the wound not healing need for additional surgery.  Patient states he understands wished to proceed at this time.  Nadara Mustard, MD 12/20/2022, 6:44 AM

## 2022-12-20 NOTE — Anesthesia Preprocedure Evaluation (Addendum)
Anesthesia Evaluation  Patient identified by MRN, date of birth, ID band Patient awake    Reviewed: Allergy & Precautions, NPO status , Patient's Chart, lab work & pertinent test results  Airway Mallampati: III  TM Distance: >3 FB Neck ROM: Full    Dental  (+) Edentulous Upper, Edentulous Lower   Pulmonary asthma    Pulmonary exam normal        Cardiovascular hypertension, + CAD, + Past MI and + Peripheral Vascular Disease   Rhythm:Regular Rate:Normal     The study is normal. The study is low risk.   Left ventricular function is normal. Nuclear stress EF: 52 %.  End diastolic cavity size is normal.   Prior study available for comparison from 12/28/2020.    Neuro/Psych   Anxiety Depression    negative neurological ROS     GI/Hepatic Neg liver ROS,GERD  ,,  Endo/Other  diabetes, Type 2, Oral Hypoglycemic Agents    Renal/GU   negative genitourinary   Musculoskeletal  (+) Arthritis ,    Abdominal Normal abdominal exam  (+)   Peds  Hematology  (+) Blood dyscrasia, anemia   Anesthesia Other Findings   Reproductive/Obstetrics                             Anesthesia Physical Anesthesia Plan  ASA: 3  Anesthesia Plan: MAC   Post-op Pain Management:    Induction: Intravenous  PONV Risk Score and Plan: 2 and Ondansetron, Dexamethasone, Midazolam, Treatment may vary due to age or medical condition and Propofol infusion  Airway Management Planned: Simple Face Mask and Nasal Cannula  Additional Equipment: None  Intra-op Plan:   Post-operative Plan:   Informed Consent: I have reviewed the patients History and Physical, chart, labs and discussed the procedure including the risks, benefits and alternatives for the proposed anesthesia with the patient or authorized representative who has indicated his/her understanding and acceptance.     Dental advisory given  Plan Discussed with:  CRNA  Anesthesia Plan Comments:        Anesthesia Quick Evaluation

## 2022-12-20 NOTE — Progress Notes (Signed)
Pt has severe allergy to [facility]. Dr. Lajoyce Corners is aware.

## 2022-12-20 NOTE — Transfer of Care (Signed)
Immediate Anesthesia Transfer of Care Note  Patient: Kyle [name]  Procedure(s) Performed: RIGHT CHOPART AMPUTATION (Right)  Patient Location: PACU  Anesthesia Type:MAC  Level of Consciousness: awake, alert , and oriented  Airway & Oxygen Therapy: Patient Spontanous Breathing  Post-op Assessment: Report given to RN and Post -op Vital signs reviewed and stable  Post vital signs: Reviewed and stable  Last Vitals:  Vitals Value Taken Time  BP 101/71 12/20/22 1348  Temp 36.7 C 12/20/22 1348  Pulse 70 12/20/22 1353  Resp 10 12/20/22 1353  SpO2 96 % 12/20/22 1353  Vitals shown include unvalidated device data.  Last Pain:  Vitals:   12/20/22 1212  TempSrc: Oral  PainSc: 0-No pain         Complications: No notable events documented.

## 2022-12-20 NOTE — Interval H&P Note (Signed)
History and Physical Interval Note:  12/20/2022 12:22 PM  Kyle [name]  has presented today for surgery, with the diagnosis of Dehiscence Right Transmetatarsal Amputation.  The various methods of treatment have been discussed with the patient and family. After consideration of risks, benefits and other options for treatment, the patient has consented to  Procedure(s): RIGHT CHOPART AMPUTATION (Right) as a surgical intervention.  The patient's history has been reviewed, patient examined, no change in status, stable for surgery.  I have reviewed the patient's chart and labs.  Questions were answered to the patient's satisfaction.     Nadara Mustard

## 2022-12-21 ENCOUNTER — Encounter (HOSPITAL_COMMUNITY): Payer: Self-pay | Admitting: Orthopedic Surgery

## 2022-12-24 ENCOUNTER — Ambulatory Visit (INDEPENDENT_AMBULATORY_CARE_PROVIDER_SITE_OTHER): Payer: Medicare Other | Admitting: Orthopedic Surgery

## 2022-12-24 ENCOUNTER — Telehealth: Payer: Self-pay | Admitting: Orthopedic Surgery

## 2022-12-24 ENCOUNTER — Encounter: Payer: Self-pay | Admitting: Orthopedic Surgery

## 2022-12-24 DIAGNOSIS — T8781 Dehiscence of amputation stump: Secondary | ICD-10-CM

## 2022-12-24 DIAGNOSIS — Z89431 Acquired absence of right foot: Secondary | ICD-10-CM

## 2022-12-24 MED ORDER — HYDROCODONE-ACETAMINOPHEN 10-325 MG PO TABS: 1.0000 | ORAL_TABLET | Freq: Four times a day (QID) | ORAL | 0 refills | Status: DC | PRN

## 2022-12-24 NOTE — Progress Notes (Signed)
Office Visit Note   Patient: Kyle [name]           Date of Birth: 12-13-1960           MRN: [id number] Visit Date: 12/24/2022              Requested by: [name], MD [address] [place],  [place] [address] PCP: [name], MD  Chief Complaint  Patient presents with   Right Foot - Routine Post Op    12/20/22 right chopart amputation      HPI: Patient is a 62 year old gentleman who is status post midfoot amputation on the right revision.  Patient states he has been having problems with wound VAC alarms.  Patient states the oxycodone does not help his pain but hydrocodone has helped in the past.  Assessment & Plan: Visit Diagnoses:  1. Dehiscence of amputation stump of right lower extremity (HCC)   2. History of transmetatarsal amputation of right foot (HCC)     Plan: Prescription sent for hydrocodone 10 mg.  Patient will begin dry dressing changes with Dial soap cleansing daily.  Continue elevation and nonweightbearing.  Follow-Up Instructions: Return in about 1 week (around 12/31/2022).   Ortho Exam  Patient is alert, oriented, no adenopathy, well-dressed, normal affect, normal respiratory effort. The wound VAC is removed there is 150 cc in the canister.  There is skin maceration from the drainage.  The wound edges are well-approximated with no cellulitis no necrosis no wound dehiscence.  Imaging: No results found. No images are attached to the encounter.  Labs: Lab Results  Component Value Date   HGBA1C 7.0 (H) 11/12/2022   HGBA1C 9.2 (H) 10/14/2016   HGBA1C 8.8 (H) 09/03/2016   ESRSEDRATE 79 (H) 11/11/2022   CRP 18.4 (H) 11/11/2022   REPTSTATUS 11/16/2022 FINAL 11/11/2022   CULT  11/11/2022    NO GROWTH 5 DAYS Performed at [facility], [address], [place], [place] [address]      Lab Results  Component Value Date   ALBUMIN 3.4 (L) 11/11/2022    Lab Results  Component Value Date   MG 1.8 11/15/2022   MG 1.6 (L) 11/14/2022   No  results found for: "VD25OH"  No results found for: "PREALBUMIN"    Latest Ref Rng & Units 11/13/2022    1:06 AM 11/12/2022    2:53 AM 11/11/2022    6:00 PM  CBC EXTENDED  WBC 4.0 - 10.5 K/uL 8.4  11.1  13.1   RBC 4.22 - 5.81 MIL/uL 3.72  3.39  4.01   Hemoglobin 13.0 - 17.0 g/dL 65.7  9.2  84.6   HCT 96.2 - 52.0 % 32.1  29.3  35.5   Platelets 150 - 400 K/uL 320  294  361   NEUT# 1.7 - 7.7 K/uL 4.7   9.6   Lymph# 0.7 - 4.0 K/uL 2.4   1.9      There is no height or weight on file to calculate BMI.  Orders:  No orders of the defined types were placed in this encounter.  Meds ordered this encounter  Medications   HYDROcodone-acetaminophen (NORCO) 10-325 MG tablet    Sig: Take 1 tablet by mouth every 6 (six) hours as needed.    Dispense:  30 tablet    Refill:  0     Procedures: No procedures performed  Clinical Data: No additional findings.  ROS:  All other systems negative, except as noted in the HPI.  Review of Systems  Objective: Vital Signs: There were no vitals taken for this visit.  Specialty Comments:  No specialty comments available.  PMFS History: Patient Active Problem List   Diagnosis Date Noted   Dehiscence of amputation stump of right lower extremity (HCC) 12/20/2022   Gangrene of toe of right foot (HCC) 11/11/2022   AKI (acute kidney injury) (HCC) 11/11/2022   Status post partial colectomy 10/04/2022   Type 2 diabetes mellitus with hyperglycemia, with long-term current use of insulin (HCC) 04/03/2022   Type 2 diabetes mellitus with diabetic polyneuropathy, with long-term current use of insulin (HCC) 04/03/2022   Diabetes mellitus (HCC) 04/03/2022   Colonic mass 03/04/2022   Skin ulcer of right great toe (HCC) 01/07/2022   Primary insomnia 12/19/2021   Noncompliance 05/30/2021   Back pain of lumbar region with sciatica 01/31/2021   Epididymoorchitis 12/31/2020   Anemia of unknown etiology    Arthritis    Coronary artery disease    Degenerative  disc disease, lumbar    Depression    Diabetes mellitus without complication (HCC)    GERD (gastroesophageal reflux disease)    Family history of adverse reaction to anesthesia    Hyperlipemia    Myocardial infarction (HCC)    Elevated alkaline phosphatase level 11/27/2020   Iron deficiency anemia due to chronic blood loss 11/07/2020   Vitamin B12 deficiency 11/02/2020   Anxiety disorder 11/01/2020   Benign prostatic hyperplasia with urinary frequency 11/01/2020   Coronary artery disease of native artery of native heart with stable angina pectoris (HCC) 11/01/2020   Mild intermittent asthma without complication 11/01/2020   Moderate episode of recurrent major depressive disorder (HCC) 11/01/2020   Psoriasis 11/01/2020   Uncomplicated opioid dependence (HCC) 11/01/2020   Primary osteoarthritis involving multiple joints 11/01/2020   Degenerative lumbar disc 11/01/2020   Mixed hyperlipidemia 06/28/2020   Malaise and fatigue 06/28/2020   Pneumonia 08/2016   History of arthroplasty of right knee 10/20/2015   Sepsis, unspecified organism (HCC) 10/20/2015   Primary osteoarthritis of right knee 09/30/2015   Morbid obesity (HCC) 08/23/2015   Essential (primary) hypertension 08/23/2015   Degeneration of intervertebral disc of lumbosacral region 08/23/2015   Generalized abdominal pain 08/23/2015   Class 2 severe obesity due to excess calories with serious comorbidity and body mass index (BMI) of 38.0 to 38.9 in adult (HCC) 08/23/2015   Type 2 diabetes mellitus without complications (HCC) 08/23/2015   Pain in lower limb 12/07/2013   Plantar fasciitis of left foot 11/01/2013   Metatarsal deformity 11/01/2013   Diabetes (HCC) 11/01/2013   Type 2 diabetes mellitus without complication, with long-term current use of insulin (HCC) 11/01/2013   DEHYDRATION 01/26/2009   DYSTHYMIC DISORDER 01/26/2009   ATHEROSLERO NATV [name] EXTREM W/INTERMIT CLAUDICAT 01/26/2009   CLAUDICATION 01/26/2009   DYSPNEA  01/26/2009   CHEST PAIN UNSPECIFIED 01/26/2009   Past Medical History:  Diagnosis Date   AKI (acute kidney injury) (HCC) 11/11/2022   Anemia of unknown etiology    Anxiety disorder    Arthritis    ATHEROSLERO NATV [name] EXTREM W/INTERMIT CLAUDICAT 01/26/2009   Qualifier: Diagnosis of  By: Valinda Party RN, Nancy     Back pain of lumbar region with sciatica 01/31/2021   Benign prostatic hyperplasia with urinary frequency    CHEST PAIN UNSPECIFIED 01/26/2009   Qualifier: Diagnosis of  By: Valinda Party RN, Harriett Sine     Class 2 severe obesity due to excess calories with serious comorbidity and body mass index (BMI) of 38.0 to  38.9 in adult [facility])    CLAUDICATION 01/26/2009   Qualifier: Diagnosis of  By: Valinda Party RN, Nancy     Colonic mass 03/04/2022   Coronary artery disease    minimal CAD '12; NL stress test 06/10/14 ([facility])   Coronary artery disease of native artery of native heart with stable angina pectoris (HCC)    Degeneration of intervertebral disc of lumbosacral region 08/23/2015   Degenerative disc disease, lumbar    Degenerative lumbar disc 11/01/2020   Formatting of this note might be different from the original. Was recommended back surgery. Holding out.   Dehydration 01/26/2009   Qualifier: Diagnosis of  By: Valinda Party RN, Nancy     Depression    Diabetes (HCC) 11/01/2013   Diabetes mellitus (HCC) 04/03/2022   Diabetes mellitus without complication (HCC)    DYSPNEA 01/26/2009   Qualifier: Diagnosis of  By: Valinda Party RN, Harriett Sine     Dysthymic disorder 01/26/2009   Qualifier: Diagnosis of  By: Valinda Party RN, Nancy     Elevated alkaline phosphatase level 11/27/2020   Epididymoorchitis 12/31/2020   Essential (primary) hypertension 08/23/2015   Family history of adverse reaction to anesthesia    "it made my son sick"   Gangrene of toe of right foot (HCC) 11/11/2022   Generalized abdominal pain 08/23/2015   GERD (gastroesophageal reflux disease)    History of arthroplasty of right knee     Hyperlipemia    Iron deficiency anemia due to chronic blood loss 11/07/2020   Malaise and fatigue 06/28/2020   Metatarsal deformity 11/01/2013   Mild intermittent asthma without complication    Mixed hyperlipidemia    Moderate episode of recurrent major depressive disorder (HCC)    Morbid obesity (HCC) 08/23/2015   Myocardial infarction (HCC)    2012; hospitalized [facility] for chest pain syndrome 02/2011 and [facility] showed trivial CAD   Noncompliance 05/30/2021   Pain in lower limb 12/07/2013   Plantar fasciitis of left foot 11/01/2013   Pneumonia 08/2016   surgery had to be rescheduled due to pneumonia   Primary insomnia 12/19/2021   Formatting of this note might be different from the original.  Avoid Rx   Primary osteoarthritis involving multiple joints 11/01/2020   Formatting of this note might be different from the original. both knees replaced.   Primary osteoarthritis of left knee 09/06/2016   Primary osteoarthritis of right knee 09/30/2015   Psoriasis    Sepsis, unspecified organism (HCC) 10/20/2015   Skin ulcer of right great toe (HCC) 01/07/2022   Status post partial colectomy 10/04/2022   Type 2 diabetes mellitus with diabetic polyneuropathy, with long-term current use of insulin (HCC) 04/03/2022   Type 2 diabetes mellitus with hyperglycemia, with long-term current use of insulin (HCC) 04/03/2022   Type 2 diabetes mellitus without complication, with long-term current use of insulin (HCC) 11/01/2013   Type 2 diabetes mellitus without complications (HCC) 08/23/2015   Uncomplicated opioid dependence (HCC)    Vitamin B12 deficiency 11/02/2020    Family History  Problem Relation Age of Onset   Cancer Mother    Diabetes Mother    Hyperlipidemia Father    Hypertension Father    Heart failure Father    Heart failure Brother     Past Surgical History:  Procedure Laterality Date   AMPUTATION Right 11/15/2022   Procedure: RIGHT TRANSMETATARSAL AMPUTATION;  Surgeon: Nadara Mustard, MD;   Location: [facility] OR;  Service: Orthopedics;  Laterality: Right;   AMPUTATION Right 12/20/2022   Procedure: RIGHT CHOPART AMPUTATION;  Surgeon: Nadara Mustard, MD;  Location: [facility] OR;  Service: Orthopedics;  Laterality: Right;   CARDIAC CATHETERIZATION     03/28/11 [facility]: NL LM, LAD; 10% mCX, mRCA. EF 65%. (Dr. Dot Been, [facility])   CHOLECYSTECTOMY     CORONARY ANGIOPLASTY     2012 [facility]    ESOPHAGEAL DILATION     SEVERAL TIMES   Heel Spur Resection with Fasiotomy Left 11/25/2013   @ [facility]   KNEE ARTHROSCOPY Bilateral    SHOULDER ARTHROSCOPY WITH OPEN ROTATOR CUFF REPAIR Right    TOTAL KNEE ARTHROPLASTY Right 10/04/2015   Procedure: RIGHT TOTAL KNEE ARTHROPLASTY;  Surgeon: Gean Birchwood, MD;  Location: [facility] OR;  Service: Orthopedics;  Laterality: Right;   TOTAL KNEE ARTHROPLASTY Left 10/14/2016   TOTAL KNEE ARTHROPLASTY Left 10/14/2016   Procedure: LEFT TOTAL KNEE ARTHROPLASTY;  Surgeon: Gean Birchwood, MD;  Location: [facility] OR;  Service: Orthopedics;  Laterality: Left;   TUMOR REMOVAL     FATTY TUMOR  RT SHOULDER   Social History   Occupational History   Not on file  Tobacco Use   Smoking status: Never   Smokeless tobacco: Never  Vaping Use   Vaping Use: Never used  Substance and Sexual Activity   Alcohol use: No   Drug use: No   Sexual activity: Not on file

## 2022-12-24 NOTE — Telephone Encounter (Signed)
Patient advising that he is needing another pain medication the one he has now is not workinfg. Wants to speak to someone in [facility]

## 2022-12-24 NOTE — Telephone Encounter (Signed)
I called and sw the pt. His vac was alarming in the back ground. He said he had to "disconnect it to go to the restroom" this is a portable vac. I am not sure what the pt did to the device but offered an appt to come in this afternoon to discuss and eval pain level and remove the wound vac.

## 2022-12-25 ENCOUNTER — Encounter: Payer: Medicare Other | Admitting: Family

## 2022-12-31 ENCOUNTER — Ambulatory Visit: Payer: Medicare Other | Admitting: Orthopedic Surgery

## 2022-12-31 ENCOUNTER — Other Ambulatory Visit: Payer: Self-pay | Admitting: Orthopedic Surgery

## 2022-12-31 ENCOUNTER — Telehealth: Payer: Self-pay | Admitting: Orthopedic Surgery

## 2022-12-31 MED ORDER — HYDROCODONE-ACETAMINOPHEN 10-325 MG PO TABS: 1.0000 | ORAL_TABLET | Freq: Four times a day (QID) | ORAL | 0 refills | Status: DC | PRN

## 2022-12-31 NOTE — Telephone Encounter (Signed)
Pt is s/p a right Chopart amputation on 12/20/2022. Oxycodone 10/325 #30 given 12/20/2022 and Hydrocodone 10/325 #30 given on 12/24/2022

## 2022-12-31 NOTE — Telephone Encounter (Signed)
Patient requesting Oxycodone refill sent to [facility] on [address] in [place], had car trouble had to reschedule appt for today broke down otw to appt

## 2023-01-03 ENCOUNTER — Encounter: Payer: Self-pay | Admitting: Family

## 2023-01-03 ENCOUNTER — Ambulatory Visit: Payer: Medicare Other | Admitting: Family

## 2023-01-03 DIAGNOSIS — Z89431 Acquired absence of right foot: Secondary | ICD-10-CM

## 2023-01-03 MED ORDER — OXYCODONE-ACETAMINOPHEN 10-325 MG PO TABS: 1.0000 | ORAL_TABLET | Freq: Four times a day (QID) | ORAL | 0 refills | Status: DC | PRN

## 2023-01-03 NOTE — Progress Notes (Signed)
Post-Op Visit Note   Patient: Kyle [name]           Date of Birth: 11-Dec-1960           MRN: [id number] Visit Date: 01/03/2023 PCP: [name], MD  Chief Complaint:  Chief Complaint  Patient presents with   Right Foot - Routine Post Op    12/20/2022 right Chopart amputation     HPI:  HPI The patient is a 62 year old gentleman seen status post right Chopart amputation May 24 he has been using a kneeling scooter.  He does continue to complain of worse pain he describes it as aching throbbing shooting he has been using 10 mg of Norco without relief Ortho Exam On examination of the right foot his incision is approximated with sutures there is gaping the full length of the incision about 2 mm in width with granulation in the wound bed there is scant maceration no erythema  Visit Diagnoses: No diagnosis found.  Plan: Discussed the importance of compression and elevation for swelling and throbbing type pain will send refilling of his pain medication he will follow-up in 2 weeks for suture removal daily Dial soap cleansing.  Dry dressings.  Follow-Up Instructions: Return in about 2 weeks (around 01/17/2023).   Imaging: No results found.  Orders:  No orders of the defined types were placed in this encounter.  No orders of the defined types were placed in this encounter.    PMFS History: Patient Active Problem List   Diagnosis Date Noted   Dehiscence of amputation stump of right lower extremity (HCC) 12/20/2022   Gangrene of toe of right foot (HCC) 11/11/2022   AKI (acute kidney injury) (HCC) 11/11/2022   Status post partial colectomy 10/04/2022   Type 2 diabetes mellitus with hyperglycemia, with long-term current use of insulin (HCC) 04/03/2022   Type 2 diabetes mellitus with diabetic polyneuropathy, with long-term current use of insulin (HCC) 04/03/2022   Diabetes mellitus (HCC) 04/03/2022   Colonic mass 03/04/2022   Skin ulcer of right great toe (HCC) 01/07/2022    Primary insomnia 12/19/2021   Noncompliance 05/30/2021   Back pain of lumbar region with sciatica 01/31/2021   Epididymoorchitis 12/31/2020   Anemia of unknown etiology    Arthritis    Coronary artery disease    Degenerative disc disease, lumbar    Depression    Diabetes mellitus without complication (HCC)    GERD (gastroesophageal reflux disease)    Family history of adverse reaction to anesthesia    Hyperlipemia    Myocardial infarction (HCC)    Elevated alkaline phosphatase level 11/27/2020   Iron deficiency anemia due to chronic blood loss 11/07/2020   Vitamin B12 deficiency 11/02/2020   Anxiety disorder 11/01/2020   Benign prostatic hyperplasia with urinary frequency 11/01/2020   Coronary artery disease of native artery of native heart with stable angina pectoris (HCC) 11/01/2020   Mild intermittent asthma without complication 11/01/2020   Moderate episode of recurrent major depressive disorder (HCC) 11/01/2020   Psoriasis 11/01/2020   Uncomplicated opioid dependence (HCC) 11/01/2020   Primary osteoarthritis involving multiple joints 11/01/2020   Degenerative lumbar disc 11/01/2020   Mixed hyperlipidemia 06/28/2020   Malaise and fatigue 06/28/2020   Pneumonia 08/2016   History of arthroplasty of right knee 10/20/2015   Sepsis, unspecified organism (HCC) 10/20/2015   Primary osteoarthritis of right knee 09/30/2015   Morbid obesity (HCC) 08/23/2015   Essential (primary) hypertension 08/23/2015   Degeneration of intervertebral disc of lumbosacral region 08/23/2015  Generalized abdominal pain 08/23/2015   Class 2 severe obesity due to excess calories with serious comorbidity and body mass index (BMI) of 38.0 to 38.9 in adult [facility]) 08/23/2015   Type 2 diabetes mellitus without complications (HCC) 08/23/2015   Pain in lower limb 12/07/2013   Plantar fasciitis of left foot 11/01/2013   Metatarsal deformity 11/01/2013   Diabetes (HCC) 11/01/2013   Type 2 diabetes mellitus  without complication, with long-term current use of insulin (HCC) 11/01/2013   DEHYDRATION 01/26/2009   DYSTHYMIC DISORDER 01/26/2009   ATHEROSLERO NATV ART EXTREM W/INTERMIT CLAUDICAT 01/26/2009   CLAUDICATION 01/26/2009   DYSPNEA 01/26/2009   CHEST PAIN UNSPECIFIED 01/26/2009   Past Medical History:  Diagnosis Date   AKI (acute kidney injury) (HCC) 11/11/2022   Anemia of unknown etiology    Anxiety disorder    Arthritis    ATHEROSLERO NATV ART EXTREM W/INTERMIT CLAUDICAT 01/26/2009   Qualifier: Diagnosis of  By: Valinda Party RN, Nancy     Back pain of lumbar region with sciatica 01/31/2021   Benign prostatic hyperplasia with urinary frequency    CHEST PAIN UNSPECIFIED 01/26/2009   Qualifier: Diagnosis of  By: Valinda Party RN, Harriett Sine     Class 2 severe obesity due to excess calories with serious comorbidity and body mass index (BMI) of 38.0 to 38.9 in adult [facility])    CLAUDICATION 01/26/2009   Qualifier: Diagnosis of  By: Valinda Party RN, Nancy     Colonic mass 03/04/2022   Coronary artery disease    minimal CAD '12; NL stress test 06/10/14 ([facility])   Coronary artery disease of native artery of native heart with stable angina pectoris (HCC)    Degeneration of intervertebral disc of lumbosacral region 08/23/2015   Degenerative disc disease, lumbar    Degenerative lumbar disc 11/01/2020   Formatting of this note might be different from the original. Was recommended back surgery. Holding out.   Dehydration 01/26/2009   Qualifier: Diagnosis of  By: Valinda Party RN, Nancy     Depression    Diabetes (HCC) 11/01/2013   Diabetes mellitus (HCC) 04/03/2022   Diabetes mellitus without complication (HCC)    DYSPNEA 01/26/2009   Qualifier: Diagnosis of  By: Valinda Party RN, Harriett Sine     Dysthymic disorder 01/26/2009   Qualifier: Diagnosis of  By: Valinda Party RN, Nancy     Elevated alkaline phosphatase level 11/27/2020   Epididymoorchitis 12/31/2020   Essential (primary) hypertension 08/23/2015   Family history of  adverse reaction to anesthesia    "it made my son sick"   Gangrene of toe of right foot (HCC) 11/11/2022   Generalized abdominal pain 08/23/2015   GERD (gastroesophageal reflux disease)    History of arthroplasty of right knee    Hyperlipemia    Iron deficiency anemia due to chronic blood loss 11/07/2020   Malaise and fatigue 06/28/2020   Metatarsal deformity 11/01/2013   Mild intermittent asthma without complication    Mixed hyperlipidemia    Moderate episode of recurrent major depressive disorder (HCC)    Morbid obesity (HCC) 08/23/2015   Myocardial infarction (HCC)    2012; hospitalized [facility] for chest pain syndrome 02/2011 and LHC showed trivial CAD   Noncompliance 05/30/2021   Pain in lower limb 12/07/2013   Plantar fasciitis of left foot 11/01/2013   Pneumonia 08/2016   surgery had to be rescheduled due to pneumonia   Primary insomnia 12/19/2021   Formatting of this note might be different from the original.  Avoid Rx   Primary osteoarthritis involving multiple  joints 11/01/2020   Formatting of this note might be different from the original. both knees replaced.   Primary osteoarthritis of left knee 09/06/2016   Primary osteoarthritis of right knee 09/30/2015   Psoriasis    Sepsis, unspecified organism (HCC) 10/20/2015   Skin ulcer of right great toe (HCC) 01/07/2022   Status post partial colectomy 10/04/2022   Type 2 diabetes mellitus with diabetic polyneuropathy, with long-term current use of insulin (HCC) 04/03/2022   Type 2 diabetes mellitus with hyperglycemia, with long-term current use of insulin (HCC) 04/03/2022   Type 2 diabetes mellitus without complication, with long-term current use of insulin (HCC) 11/01/2013   Type 2 diabetes mellitus without complications (HCC) 08/23/2015   Uncomplicated opioid dependence (HCC)    Vitamin B12 deficiency 11/02/2020    Family History  Problem Relation Age of Onset   Cancer Mother    Diabetes Mother    Hyperlipidemia Father     Hypertension Father    Heart failure Father    Heart failure Brother     Past Surgical History:  Procedure Laterality Date   AMPUTATION Right 11/15/2022   Procedure: RIGHT TRANSMETATARSAL AMPUTATION;  Surgeon: Nadara Mustard, MD;  Location: [facility] OR;  Service: Orthopedics;  Laterality: Right;   AMPUTATION Right 12/20/2022   Procedure: RIGHT CHOPART AMPUTATION;  Surgeon: Nadara Mustard, MD;  Location: [facility] OR;  Service: Orthopedics;  Laterality: Right;   CARDIAC CATHETERIZATION     03/28/11 LHC: NL LM, LAD; 10% mCX, mRCA. EF 65%. (Dr. Dot Been, [facility])   CHOLECYSTECTOMY     CORONARY ANGIOPLASTY     [facility]    ESOPHAGEAL DILATION     SEVERAL TIMES   Heel Spur Resection with Fasiotomy Left 11/25/2013   @ [facility]   KNEE ARTHROSCOPY Bilateral    SHOULDER ARTHROSCOPY WITH OPEN ROTATOR CUFF REPAIR Right    TOTAL KNEE ARTHROPLASTY Right 10/04/2015   Procedure: RIGHT TOTAL KNEE ARTHROPLASTY;  Surgeon: Gean Birchwood, MD;  Location: [facility] OR;  Service: Orthopedics;  Laterality: Right;   TOTAL KNEE ARTHROPLASTY Left 10/14/2016   TOTAL KNEE ARTHROPLASTY Left 10/14/2016   Procedure: LEFT TOTAL KNEE ARTHROPLASTY;  Surgeon: Gean Birchwood, MD;  Location: [facility] OR;  Service: Orthopedics;  Laterality: Left;   TUMOR REMOVAL     FATTY TUMOR  RT SHOULDER   Social History   Occupational History   Not on file  Tobacco Use   Smoking status: Never   Smokeless tobacco: Never  Vaping Use   Vaping Use: Never used  Substance and Sexual Activity   Alcohol use: No   Drug use: No   Sexual activity: Not on file

## 2023-01-06 ENCOUNTER — Telehealth: Payer: Self-pay | Admitting: Orthopedic Surgery

## 2023-01-06 NOTE — Telephone Encounter (Signed)
Patient called advised he is having stinging and burning in right foot. Patient asked is there something he can take for the burning and stinging?  Patient said the foot opened again. The number to contact patient is [phone]

## 2023-01-07 ENCOUNTER — Encounter: Payer: Self-pay | Admitting: Orthopedic Surgery

## 2023-01-07 ENCOUNTER — Ambulatory Visit (INDEPENDENT_AMBULATORY_CARE_PROVIDER_SITE_OTHER): Payer: Medicare Other | Admitting: Orthopedic Surgery

## 2023-01-07 DIAGNOSIS — Z89431 Acquired absence of right foot: Secondary | ICD-10-CM

## 2023-01-07 MED ORDER — HYDROCODONE-ACETAMINOPHEN 5-325 MG PO TABS: 1.0000 | ORAL_TABLET | Freq: Four times a day (QID) | ORAL | 0 refills | Status: DC | PRN

## 2023-01-07 MED ORDER — DOXYCYCLINE HYCLATE 100 MG PO TABS: 100.0000 mg | ORAL_TABLET | Freq: Two times a day (BID) | ORAL | 0 refills | Status: DC

## 2023-01-07 NOTE — Progress Notes (Signed)
Office Visit Note   Patient: Kyle [name]           Date of Birth: Apr 19, 1961           MRN: [id number] Visit Date: 01/07/2023              Requested by: [name], MD [address] [place],  [place] [address] PCP: [name], MD  Chief Complaint  Patient presents with   Right Foot - Routine Post Op    12/20/2022 right Chopart amputation      HPI: Patient is a 62 year old gentleman status post right midfoot amputation revision.  Patient states he is having burning pain.  He cannot tolerate the oxycodone.  Assessment & Plan: Visit Diagnoses:  1. History of transmetatarsal amputation of right foot (HCC)     Plan: Prescription was provided for Vicodin and a prescription for doxycycline.  Patient will proceed with Dial soap cleansing minimize weightbearing dry dressing changes  Follow-Up Instructions: Return in about 2 weeks (around 01/21/2023).   Ortho Exam  Patient is alert, oriented, no adenopathy, well-dressed, normal affect, normal respiratory effort. Examination there is dependent redness and slight wound dehiscence.  There is no purulent drainage.  No ascending cellulitis.  Will continue with current wound care prescription for doxycycline provided  Imaging: No results found.   Labs: Lab Results  Component Value Date   HGBA1C 7.0 (H) 11/12/2022   HGBA1C 9.2 (H) 10/14/2016   HGBA1C 8.8 (H) 09/03/2016   ESRSEDRATE 79 (H) 11/11/2022   CRP 18.4 (H) 11/11/2022   REPTSTATUS 11/16/2022 FINAL 11/11/2022   CULT  11/11/2022    NO GROWTH 5 DAYS Performed at [facility], [address] [place], [place] [address]      Lab Results  Component Value Date   ALBUMIN 3.4 (L) 11/11/2022    Lab Results  Component Value Date   MG 1.8 11/15/2022   MG 1.6 (L) 11/14/2022   No results found for: "VD25OH"  No results found for: "PREALBUMIN"    Latest Ref Rng & Units 11/13/2022    1:06 AM 11/12/2022    2:53 AM 11/11/2022    6:00 PM  CBC EXTENDED  WBC  4.0 - 10.5 K/uL 8.4  11.1  13.1   RBC 4.22 - 5.81 MIL/uL 3.72  3.39  4.01   Hemoglobin 13.0 - 17.0 g/dL 82.9  9.2  56.2   HCT 13.0 - 52.0 % 32.1  29.3  35.5   Platelets 150 - 400 K/uL 320  294  361   NEUT# 1.7 - 7.7 K/uL 4.7   9.6   Lymph# 0.7 - 4.0 K/uL 2.4   1.9      There is no height or weight on file to calculate BMI.  Orders:  No orders of the defined types were placed in this encounter.  Meds ordered this encounter  Medications   doxycycline (VIBRA-TABS) 100 MG tablet    Sig: Take 1 tablet (100 mg total) by mouth 2 (two) times daily.    Dispense:  30 tablet    Refill:  0   HYDROcodone-acetaminophen (NORCO/VICODIN) 5-325 MG tablet    Sig: Take 1 tablet by mouth every 6 (six) hours as needed for moderate pain.    Dispense:  30 tablet    Refill:  0     Procedures: No procedures performed  Clinical Data: No additional findings.  ROS:  All other systems negative, except as noted in the HPI. Review of  Systems  Objective: Vital Signs: There were no vitals taken for this visit.  Specialty Comments:  No specialty comments available.  PMFS History: Patient Active Problem List   Diagnosis Date Noted   Dehiscence of amputation stump of right lower extremity (HCC) 12/20/2022   Gangrene of toe of right foot (HCC) 11/11/2022   AKI (acute kidney injury) (HCC) 11/11/2022   Status post partial colectomy 10/04/2022   Type 2 diabetes mellitus with hyperglycemia, with long-term current use of insulin (HCC) 04/03/2022   Type 2 diabetes mellitus with diabetic polyneuropathy, with long-term current use of insulin (HCC) 04/03/2022   Diabetes mellitus (HCC) 04/03/2022   Colonic mass 03/04/2022   Skin ulcer of right great toe (HCC) 01/07/2022   Primary insomnia 12/19/2021   Noncompliance 05/30/2021   Back pain of lumbar region with sciatica 01/31/2021   Epididymoorchitis 12/31/2020   Anemia of unknown etiology    Arthritis    Coronary artery disease    Degenerative disc  disease, lumbar    Depression    Diabetes mellitus without complication (HCC)    GERD (gastroesophageal reflux disease)    Family history of adverse reaction to anesthesia    Hyperlipemia    Myocardial infarction (HCC)    Elevated alkaline phosphatase level 11/27/2020   Iron deficiency anemia due to chronic blood loss 11/07/2020   Vitamin B12 deficiency 11/02/2020   Anxiety disorder 11/01/2020   Benign prostatic hyperplasia with urinary frequency 11/01/2020   Coronary artery disease of native artery of native heart with stable angina pectoris (HCC) 11/01/2020   Mild intermittent asthma without complication 11/01/2020   Moderate episode of recurrent major depressive disorder (HCC) 11/01/2020   Psoriasis 11/01/2020   Uncomplicated opioid dependence (HCC) 11/01/2020   Primary osteoarthritis involving multiple joints 11/01/2020   Degenerative lumbar disc 11/01/2020   Mixed hyperlipidemia 06/28/2020   Malaise and fatigue 06/28/2020   Pneumonia 08/2016   History of arthroplasty of right knee 10/20/2015   Sepsis, unspecified organism (HCC) 10/20/2015   Primary osteoarthritis of right knee 09/30/2015   Morbid obesity (HCC) 08/23/2015   Essential (primary) hypertension 08/23/2015   Degeneration of intervertebral disc of lumbosacral region 08/23/2015   Generalized abdominal pain 08/23/2015   Class 2 severe obesity due to excess calories with serious comorbidity and body mass index (BMI) of 38.0 to 38.9 in adult (HCC) 08/23/2015   Type 2 diabetes mellitus without complications (HCC) 08/23/2015   Pain in lower limb 12/07/2013   Plantar fasciitis of left foot 11/01/2013   Metatarsal deformity 11/01/2013   Diabetes (HCC) 11/01/2013   Type 2 diabetes mellitus without complication, with long-term current use of insulin (HCC) 11/01/2013   DEHYDRATION 01/26/2009   DYSTHYMIC DISORDER 01/26/2009   ATHEROSLERO NATV ART EXTREM W/INTERMIT CLAUDICAT 01/26/2009   CLAUDICATION 01/26/2009   DYSPNEA  01/26/2009   CHEST PAIN UNSPECIFIED 01/26/2009   Past Medical History:  Diagnosis Date   AKI (acute kidney injury) (HCC) 11/11/2022   Anemia of unknown etiology    Anxiety disorder    Arthritis    ATHEROSLERO NATV ART EXTREM W/INTERMIT CLAUDICAT 01/26/2009   Qualifier: Diagnosis of  By: Valinda Party RN, Nancy     Back pain of lumbar region with sciatica 01/31/2021   Benign prostatic hyperplasia with urinary frequency    CHEST PAIN UNSPECIFIED 01/26/2009   Qualifier: Diagnosis of  By: Valinda Party RN, Harriett Sine     Class 2 severe obesity due to excess calories with serious comorbidity and body mass index (BMI) of 38.0 to 38.9 in  adult [facility])    CLAUDICATION 01/26/2009   Qualifier: Diagnosis of  By: Valinda Party RN, Nancy     Colonic mass 03/04/2022   Coronary artery disease    minimal CAD '12; NL stress test 06/10/14 ([facility])   Coronary artery disease of native artery of native heart with stable angina pectoris (HCC)    Degeneration of intervertebral disc of lumbosacral region 08/23/2015   Degenerative disc disease, lumbar    Degenerative lumbar disc 11/01/2020   Formatting of this note might be different from the original. Was recommended back surgery. Holding out.   Dehydration 01/26/2009   Qualifier: Diagnosis of  By: Valinda Party RN, Nancy     Depression    Diabetes (HCC) 11/01/2013   Diabetes mellitus (HCC) 04/03/2022   Diabetes mellitus without complication (HCC)    DYSPNEA 01/26/2009   Qualifier: Diagnosis of  By: Valinda Party RN, Harriett Sine     Dysthymic disorder 01/26/2009   Qualifier: Diagnosis of  By: Valinda Party RN, Nancy     Elevated alkaline phosphatase level 11/27/2020   Epididymoorchitis 12/31/2020   Essential (primary) hypertension 08/23/2015   Family history of adverse reaction to anesthesia    "it made my son sick"   Gangrene of toe of right foot (HCC) 11/11/2022   Generalized abdominal pain 08/23/2015   GERD (gastroesophageal reflux disease)    History of arthroplasty of right knee     Hyperlipemia    Iron deficiency anemia due to chronic blood loss 11/07/2020   Malaise and fatigue 06/28/2020   Metatarsal deformity 11/01/2013   Mild intermittent asthma without complication    Mixed hyperlipidemia    Moderate episode of recurrent major depressive disorder (HCC)    Morbid obesity (HCC) 08/23/2015   Myocardial infarction (HCC)    2012; hospitalized [facility] for chest pain syndrome 02/2011 and LHC showed trivial CAD   Noncompliance 05/30/2021   Pain in lower limb 12/07/2013   Plantar fasciitis of left foot 11/01/2013   Pneumonia 08/2016   surgery had to be rescheduled due to pneumonia   Primary insomnia 12/19/2021   Formatting of this note might be different from the original.  Avoid Rx   Primary osteoarthritis involving multiple joints 11/01/2020   Formatting of this note might be different from the original. both knees replaced.   Primary osteoarthritis of left knee 09/06/2016   Primary osteoarthritis of right knee 09/30/2015   Psoriasis    Sepsis, unspecified organism (HCC) 10/20/2015   Skin ulcer of right great toe (HCC) 01/07/2022   Status post partial colectomy 10/04/2022   Type 2 diabetes mellitus with diabetic polyneuropathy, with long-term current use of insulin (HCC) 04/03/2022   Type 2 diabetes mellitus with hyperglycemia, with long-term current use of insulin (HCC) 04/03/2022   Type 2 diabetes mellitus without complication, with long-term current use of insulin (HCC) 11/01/2013   Type 2 diabetes mellitus without complications (HCC) 08/23/2015   Uncomplicated opioid dependence (HCC)    Vitamin B12 deficiency 11/02/2020    Family History  Problem Relation Age of Onset   Cancer Mother    Diabetes Mother    Hyperlipidemia Father    Hypertension Father    Heart failure Father    Heart failure Brother     Past Surgical History:  Procedure Laterality Date   AMPUTATION Right 11/15/2022   Procedure: RIGHT TRANSMETATARSAL AMPUTATION;  Surgeon: Nadara Mustard, MD;   Location[facility] OR;  Service: Orthopedics;  Laterality: Right;   AMPUTATION Right 12/20/2022   Procedure: RIGHT CHOPART AMPUTATION;  Surgeon:  Nadara Mustard, MD;  Location: [facility] OR;  Service: Orthopedics;  Laterality: Right;   CARDIAC CATHETERIZATION     03/28/11 LHC: NL LM, LAD; 10% mCX, mRCA. EF 65%. (Dr. Dot Been, [facility])   CHOLECYSTECTOMY     CORONARY ANGIOPLASTY     2012 [facility]    ESOPHAGEAL DILATION     SEVERAL TIMES   Heel Spur Resection with Fasiotomy Left 11/25/2013   @ [facility]   KNEE ARTHROSCOPY Bilateral    SHOULDER ARTHROSCOPY WITH OPEN ROTATOR CUFF REPAIR Right    TOTAL KNEE ARTHROPLASTY Right 10/04/2015   Procedure: RIGHT TOTAL KNEE ARTHROPLASTY;  Surgeon: Gean Birchwood, MD;  Location: [facility] OR;  Service: Orthopedics;  Laterality: Right;   TOTAL KNEE ARTHROPLASTY Left 10/14/2016   TOTAL KNEE ARTHROPLASTY Left 10/14/2016   Procedure: LEFT TOTAL KNEE ARTHROPLASTY;  Surgeon: Gean Birchwood, MD;  Location: [facility] OR;  Service: Orthopedics;  Laterality: Left;   TUMOR REMOVAL     FATTY TUMOR  RT SHOULDER   Social History   Occupational History   Not on file  Tobacco Use   Smoking status: Never   Smokeless tobacco: Never  Vaping Use   Vaping Use: Never used  Substance and Sexual Activity   Alcohol use: No   Drug use: No   Sexual activity: Not on file

## 2023-01-08 ENCOUNTER — Telehealth: Payer: Self-pay | Admitting: Orthopedic Surgery

## 2023-01-08 ENCOUNTER — Encounter: Payer: Medicare Other | Admitting: Family

## 2023-01-08 NOTE — Telephone Encounter (Signed)
I called pt to advise of message below. He states that he is not getting relief with the 5/325 and I advised per protocol he should be non weightbearing on the surgical foot and elevation higher than his heart. He can take 2 aleve bid and work on conservative treatment in addition to the pain medication that he is taking.

## 2023-01-08 NOTE — Telephone Encounter (Signed)
01/07/2023 Hydrocodone 5/325 #30 6/7/20204 oxycodone 10/325 #30 01/01/2023 hydrocodone 10/325 #30 12/24/2022 hydrocodone 10/325 #30 12/20/2022 oxycodone 10/325 #30 12/09/2022 hydrocodone 10/325 30  Pt has had a total of 180 tabs in 1 month. I spoke with the pt earlier today as he was stating that his rx for 5/325 is not helping his pain. I advised that he should work on conservative measures and per protocol can take 2 aleve bid, non weight bearing and elevation. He is s/p a Chopart amp  and he said that he wanted me to ask Dr. Lajoyce Corners and not Denny Peon as she has already said no.Now states that his foot is stinging and burning. He is taking Neurontin 400 mg tid. He was also in the office yesterday. Please review and advise.

## 2023-01-08 NOTE — Telephone Encounter (Signed)
Patient called. Says the hydrocodone 5 is not helping.would like hydrocodone 10. His call back number is [phone]

## 2023-01-08 NOTE — Telephone Encounter (Signed)
Patient called in stating his right foot in the toe area feeling like its stinging and burning he would like to know what he could do about it. Please advise

## 2023-01-08 NOTE — Telephone Encounter (Signed)
Will not be increasing dose

## 2023-01-08 NOTE — Telephone Encounter (Signed)
Pt is s/p right Chopart amp 12/20/2022. Has received Hydrocodone 5/325 #30 01/07/2023, Oxycodone 10/325 #30 01/03/2023, Hydrocodone 10/325 #30 01/01/2023 90 tablets in the past 18 days please see message below and advise.

## 2023-01-09 ENCOUNTER — Telehealth: Payer: Self-pay

## 2023-01-09 NOTE — Telephone Encounter (Signed)
I called and lm on vm for pt to advise of message below. To continue with current medications and conservative therapies and call with any questions.

## 2023-01-09 NOTE — Telephone Encounter (Signed)
Crystal called due to patient calling again about an appt. I offered an appointment tomorrow morning with Denny Peon for eval

## 2023-01-09 NOTE — Telephone Encounter (Signed)
Patient called. He fell yesterday and twisted foot. He wants to be seen by Dr.Duda ASAP. Please call about working in. [phone]

## 2023-01-10 ENCOUNTER — Ambulatory Visit (INDEPENDENT_AMBULATORY_CARE_PROVIDER_SITE_OTHER): Payer: Medicare Other | Admitting: Family

## 2023-01-10 ENCOUNTER — Encounter: Payer: Self-pay | Admitting: Family

## 2023-01-10 DIAGNOSIS — T8781 Dehiscence of amputation stump: Secondary | ICD-10-CM

## 2023-01-10 MED ORDER — CLINDAMYCIN HCL 150 MG PO CAPS: 150.0000 mg | ORAL_CAPSULE | Freq: Three times a day (TID) | ORAL | 0 refills | Status: DC

## 2023-01-10 NOTE — Progress Notes (Signed)
Post-Op Visit Note   Patient: Kyle [name]           Date of Birth: 10/15/1960           MRN: [id number] Visit Date: 01/10/2023 PCP: [name], MD  Chief Complaint:  Chief Complaint  Patient presents with   Right Foot - Routine Post Op    12/20/2022 right Chopart amputation    HPI:  HPI The patient is a 62 year old gentleman status post right troches Chopart amputation he is currently on doxycycline comes in today complaining of ongoing issues with pain control he did have a fall 2 days ago throbbing pain in the foot denies fevers or chills Ortho Exam On examination of the right foot the incision is approximated sutures has opened up about 5 mm in width 5 mm in diam filled in with flat pink tissue there is mild surrounding erythema no ascending cellulitis he does have mild warmth as well no foul odor Visit Diagnoses: No diagnosis found.  Plan: Will place on Cipro as well he will continue daily dose of cleansing dry dressings have refilled his Norco he will follow-up in 2 weeks.  Discussed return precautions if no improvement will follow-up next week  Follow-Up Instructions: No follow-ups on file.   Imaging: No results found.  Orders:  No orders of the defined types were placed in this encounter.  No orders of the defined types were placed in this encounter.    PMFS History: Patient Active Problem List   Diagnosis Date Noted   Dehiscence of amputation stump of right lower extremity (HCC) 12/20/2022   Gangrene of toe of right foot (HCC) 11/11/2022   AKI (acute kidney injury) (HCC) 11/11/2022   Status post partial colectomy 10/04/2022   Type 2 diabetes mellitus with hyperglycemia, with long-term current use of insulin (HCC) 04/03/2022   Type 2 diabetes mellitus with diabetic polyneuropathy, with long-term current use of insulin (HCC) 04/03/2022   Diabetes mellitus (HCC) 04/03/2022   Colonic mass 03/04/2022   Skin ulcer of right great toe (HCC) 01/07/2022   Primary  insomnia 12/19/2021   Noncompliance 05/30/2021   Back pain of lumbar region with sciatica 01/31/2021   Epididymoorchitis 12/31/2020   Anemia of unknown etiology    Arthritis    Coronary artery disease    Degenerative disc disease, lumbar    Depression    Diabetes mellitus without complication (HCC)    GERD (gastroesophageal reflux disease)    Family history of adverse reaction to anesthesia    Hyperlipemia    Myocardial infarction (HCC)    Elevated alkaline phosphatase level 11/27/2020   Iron deficiency anemia due to chronic blood loss 11/07/2020   Vitamin B12 deficiency 11/02/2020   Anxiety disorder 11/01/2020   Benign prostatic hyperplasia with urinary frequency 11/01/2020   Coronary artery disease of native artery of native heart with stable angina pectoris (HCC) 11/01/2020   Mild intermittent asthma without complication 11/01/2020   Moderate episode of recurrent major depressive disorder (HCC) 11/01/2020   Psoriasis 11/01/2020   Uncomplicated opioid dependence (HCC) 11/01/2020   Primary osteoarthritis involving multiple joints 11/01/2020   Degenerative lumbar disc 11/01/2020   Mixed hyperlipidemia 06/28/2020   Malaise and fatigue 06/28/2020   Pneumonia 08/2016   History of arthroplasty of right knee 10/20/2015   Sepsis, unspecified organism (HCC) 10/20/2015   Primary osteoarthritis of right knee 09/30/2015   Morbid obesity (HCC) 08/23/2015   Essential (primary) hypertension 08/23/2015   Degeneration of intervertebral disc of lumbosacral region  08/23/2015   Generalized abdominal pain 08/23/2015   Class 2 severe obesity due to excess calories with serious comorbidity and body mass index (BMI) of 38.0 to 38.9 in adult [facility]) 08/23/2015   Type 2 diabetes mellitus without complications (HCC) 08/23/2015   Pain in lower limb 12/07/2013   Plantar fasciitis of left foot 11/01/2013   Metatarsal deformity 11/01/2013   Diabetes (HCC) 11/01/2013   Type 2 diabetes mellitus without  complication, with long-term current use of insulin (HCC) 11/01/2013   DEHYDRATION 01/26/2009   DYSTHYMIC DISORDER 01/26/2009   ATHEROSLERO NATV ART EXTREM W/INTERMIT CLAUDICAT 01/26/2009   CLAUDICATION 01/26/2009   DYSPNEA 01/26/2009   CHEST PAIN UNSPECIFIED 01/26/2009   Past Medical History:  Diagnosis Date   AKI (acute kidney injury) (HCC) 11/11/2022   Anemia of unknown etiology    Anxiety disorder    Arthritis    ATHEROSLERO NATV ART EXTREM W/INTERMIT CLAUDICAT 01/26/2009   Qualifier: Diagnosis of  By: Valinda Party RN, Nancy     Back pain of lumbar region with sciatica 01/31/2021   Benign prostatic hyperplasia with urinary frequency    CHEST PAIN UNSPECIFIED 01/26/2009   Qualifier: Diagnosis of  By: Valinda Party RN, Harriett Sine     Class 2 severe obesity due to excess calories with serious comorbidity and body mass index (BMI) of 38.0 to 38.9 in adult [facility])    CLAUDICATION 01/26/2009   Qualifier: Diagnosis of  By: Valinda Party RN, Nancy     Colonic mass 03/04/2022   Coronary artery disease    minimal CAD '12; NL stress test 06/10/14 ([facility])   Coronary artery disease of native artery of native heart with stable angina pectoris (HCC)    Degeneration of intervertebral disc of lumbosacral region 08/23/2015   Degenerative disc disease, lumbar    Degenerative lumbar disc 11/01/2020   Formatting of this note might be different from the original. Was recommended back surgery. Holding out.   Dehydration 01/26/2009   Qualifier: Diagnosis of  By: Valinda Party RN, Nancy     Depression    Diabetes (HCC) 11/01/2013   Diabetes mellitus (HCC) 04/03/2022   Diabetes mellitus without complication (HCC)    DYSPNEA 01/26/2009   Qualifier: Diagnosis of  By: Valinda Party RN, Harriett Sine     Dysthymic disorder 01/26/2009   Qualifier: Diagnosis of  By: Valinda Party RN, Nancy     Elevated alkaline phosphatase level 11/27/2020   Epididymoorchitis 12/31/2020   Essential (primary) hypertension 08/23/2015   Family history of adverse  reaction to anesthesia    "it made my son sick"   Gangrene of toe of right foot (HCC) 11/11/2022   Generalized abdominal pain 08/23/2015   GERD (gastroesophageal reflux disease)    History of arthroplasty of right knee    Hyperlipemia    Iron deficiency anemia due to chronic blood loss 11/07/2020   Malaise and fatigue 06/28/2020   Metatarsal deformity 11/01/2013   Mild intermittent asthma without complication    Mixed hyperlipidemia    Moderate episode of recurrent major depressive disorder (HCC)    Morbid obesity (HCC) 08/23/2015   Myocardial infarction (HCC)    2012; hospitalized [facility] for chest pain syndrome 02/2011 and LHC showed trivial CAD   Noncompliance 05/30/2021   Pain in lower limb 12/07/2013   Plantar fasciitis of left foot 11/01/2013   Pneumonia 08/2016   surgery had to be rescheduled due to pneumonia   Primary insomnia 12/19/2021   Formatting of this note might be different from the original.  Avoid Rx   Primary  osteoarthritis involving multiple joints 11/01/2020   Formatting of this note might be different from the original. both knees replaced.   Primary osteoarthritis of left knee 09/06/2016   Primary osteoarthritis of right knee 09/30/2015   Psoriasis    Sepsis, unspecified organism (HCC) 10/20/2015   Skin ulcer of right great toe (HCC) 01/07/2022   Status post partial colectomy 10/04/2022   Type 2 diabetes mellitus with diabetic polyneuropathy, with long-term current use of insulin (HCC) 04/03/2022   Type 2 diabetes mellitus with hyperglycemia, with long-term current use of insulin (HCC) 04/03/2022   Type 2 diabetes mellitus without complication, with long-term current use of insulin (HCC) 11/01/2013   Type 2 diabetes mellitus without complications (HCC) 08/23/2015   Uncomplicated opioid dependence (HCC)    Vitamin B12 deficiency 11/02/2020    Family History  Problem Relation Age of Onset   Cancer Mother    Diabetes Mother    Hyperlipidemia Father     Hypertension Father    Heart failure Father    Heart failure Brother     Past Surgical History:  Procedure Laterality Date   AMPUTATION Right 11/15/2022   Procedure: RIGHT TRANSMETATARSAL AMPUTATION;  Surgeon: Nadara Mustard, MD;  Location: [facility] OR;  Service: Orthopedics;  Laterality: Right;   AMPUTATION Right 12/20/2022   Procedure: RIGHT CHOPART AMPUTATION;  Surgeon: Nadara Mustard, MD;  Location: [facility] OR;  Service: Orthopedics;  Laterality: Right;   CARDIAC CATHETERIZATION     03/28/11 LHC: NL LM, LAD; 10% mCX, mRCA. EF 65%. (Dr. Dot Been, [facility])   CHOLECYSTECTOMY     CORONARY ANGIOPLASTY     2012 [facility]    ESOPHAGEAL DILATION     SEVERAL TIMES   Heel Spur Resection with Fasiotomy Left 11/25/2013   @ [facility]   KNEE ARTHROSCOPY Bilateral    SHOULDER ARTHROSCOPY WITH OPEN ROTATOR CUFF REPAIR Right    TOTAL KNEE ARTHROPLASTY Right 10/04/2015   Procedure: RIGHT TOTAL KNEE ARTHROPLASTY;  Surgeon: Gean Birchwood, MD;  Location: [facility] OR;  Service: Orthopedics;  Laterality: Right;   TOTAL KNEE ARTHROPLASTY Left 10/14/2016   TOTAL KNEE ARTHROPLASTY Left 10/14/2016   Procedure: LEFT TOTAL KNEE ARTHROPLASTY;  Surgeon: Gean Birchwood, MD;  Location: [facility] OR;  Service: Orthopedics;  Laterality: Left;   TUMOR REMOVAL     FATTY TUMOR  RT SHOULDER   Social History   Occupational History   Not on file  Tobacco Use   Smoking status: Never   Smokeless tobacco: Never  Vaping Use   Vaping Use: Never used  Substance and Sexual Activity   Alcohol use: No   Drug use: No   Sexual activity: Not on file

## 2023-01-13 ENCOUNTER — Ambulatory Visit (INDEPENDENT_AMBULATORY_CARE_PROVIDER_SITE_OTHER): Payer: Medicare Other | Admitting: Orthopedic Surgery

## 2023-01-13 ENCOUNTER — Encounter: Payer: Self-pay | Admitting: Orthopedic Surgery

## 2023-01-13 ENCOUNTER — Telehealth: Payer: Self-pay | Admitting: Orthopedic Surgery

## 2023-01-13 DIAGNOSIS — T8781 Dehiscence of amputation stump: Secondary | ICD-10-CM

## 2023-01-13 NOTE — Telephone Encounter (Signed)
Pt coming in the office today, called in and stated that his foot is looking worse.

## 2023-01-13 NOTE — Telephone Encounter (Signed)
Patient called in stating he was supposed to get meds for pain and an antibiotic but only got an antibiotic he is requesting pain medication the Oxycodone does not work and the Pharmacy he uses is out of the Hydrocodone 5 mg please advise

## 2023-01-13 NOTE — Progress Notes (Signed)
Office Visit Note   Patient: Kyle [name]           Date of Birth: 06/04/1961           MRN: [id number] Visit Date: 01/13/2023              Requested by: [name], MD [address] [place],  [place] [address] PCP: [name], MD  Chief Complaint  Patient presents with   Right Foot - Routine Post Op    12/20/2022 right Chopart amputation       HPI: Patient is a 62 year old gentleman status post revision of a transmetatarsal amputation 2 weeks ago.  Patient states he has been having increasing pain.  Patient has had 90 narcotic pills in the past 17 days.  He has had 180 pills in the last month.  He is currently nonweightbearing on a kneeling scooter.  He is on doxycycline.  Assessment & Plan: Visit Diagnoses:  1. Dehiscence of amputation stump of right lower extremity (HCC)     Plan: With the increasing ischemic pain and have recommended proceeding with a transtibial amputation.  Will plan for surgery Wednesday.  Discussed inpatient versus outpatient rehab.  Patient states he will be more interested in rehab at home.  We will see how he progresses with therapy.  Follow-Up Instructions: Return in about 2 weeks (around 01/27/2023).   Ortho Exam  Patient is alert, oriented, no adenopathy, well-dressed, normal affect, normal respiratory effort. Examination patient has progressive redness and dehiscence of the surgical's incision.  Patient has a palpable dorsalis pedis pulse.  There is no ascending cellulitis there is no purulent drainage.  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  Imaging: No results found. No images are attached to the encounter.  Labs: Lab Results  Component Value Date   HGBA1C 7.0 (H) 11/12/2022   HGBA1C 9.2 (H) 10/14/2016   HGBA1C 8.8 (H) 09/03/2016   ESRSEDRATE 79 (H) 11/11/2022   CRP 18.4 (H) 11/11/2022   REPTSTATUS 11/16/2022 FINAL 11/11/2022   CULT  11/11/2022    NO GROWTH 5 DAYS Performed at [facility], [address] [place], [place] [address]      Lab Results  Component Value Date   ALBUMIN 3.4 (L) 11/11/2022    Lab Results  Component Value Date   MG 1.8 11/15/2022   MG 1.6 (L) 11/14/2022   No results found for: "VD25OH"  No results found for: "PREALBUMIN"    Latest Ref Rng & Units 11/13/2022    1:06 AM 11/12/2022    2:53 AM 11/11/2022    6:00 PM  CBC EXTENDED  WBC 4.0 - 10.5 K/uL 8.4  11.1  13.1   RBC 4.22 - 5.81 MIL/uL 3.72  3.39  4.01   Hemoglobin 13.0 - 17.0 g/dL 78.4  9.2  69.6   HCT 29.5 - 52.0 % 32.1  29.3  35.5   Platelets 150 - 400 K/uL 320  294  361   NEUT# 1.7 - 7.7 K/uL 4.7   9.6   Lymph# 0.7 - 4.0 K/uL 2.4   1.9      There is no height or weight on file to calculate BMI.  Orders:  No orders of the defined types were placed in this encounter.  No orders of the defined types were placed in this encounter.    Procedures: No procedures performed  Clinical Data: No additional findings.  ROS:  All other systems negative, except as  noted in the HPI. Review of Systems  Objective: Vital Signs: There were no vitals taken for this visit.  Specialty Comments:  No specialty comments available.  PMFS History: Patient Active Problem List   Diagnosis Date Noted   Dehiscence of amputation stump of right lower extremity (HCC) 12/20/2022   Gangrene of toe of right foot (HCC) 11/11/2022   AKI (acute kidney injury) (HCC) 11/11/2022   Status post partial colectomy 10/04/2022   Type 2 diabetes mellitus with hyperglycemia, with long-term current use of insulin (HCC) 04/03/2022   Type 2 diabetes mellitus with diabetic polyneuropathy, with long-term current use of insulin (HCC) 04/03/2022   Diabetes mellitus (HCC) 04/03/2022   Colonic mass 03/04/2022   Skin ulcer of right great toe (HCC) 01/07/2022   Primary insomnia 12/19/2021   Noncompliance 05/30/2021   Back pain of lumbar region with sciatica 01/31/2021   Epididymoorchitis 12/31/2020    Anemia of unknown etiology    Arthritis    Coronary artery disease    Degenerative disc disease, lumbar    Depression    Diabetes mellitus without complication (HCC)    GERD (gastroesophageal reflux disease)    Family history of adverse reaction to anesthesia    Hyperlipemia    Myocardial infarction (HCC)    Elevated alkaline phosphatase level 11/27/2020   Iron deficiency anemia due to chronic blood loss 11/07/2020   Vitamin B12 deficiency 11/02/2020   Anxiety disorder 11/01/2020   Benign prostatic hyperplasia with urinary frequency 11/01/2020   Coronary artery disease of native artery of native heart with stable angina pectoris (HCC) 11/01/2020   Mild intermittent asthma without complication 11/01/2020   Moderate episode of recurrent major depressive disorder (HCC) 11/01/2020   Psoriasis 11/01/2020   Uncomplicated opioid dependence (HCC) 11/01/2020   Primary osteoarthritis involving multiple joints 11/01/2020   Degenerative lumbar disc 11/01/2020   Mixed hyperlipidemia 06/28/2020   Malaise and fatigue 06/28/2020   Pneumonia 08/2016   History of arthroplasty of right knee 10/20/2015   Sepsis, unspecified organism (HCC) 10/20/2015   Primary osteoarthritis of right knee 09/30/2015   Morbid obesity (HCC) 08/23/2015   Essential (primary) hypertension 08/23/2015   Degeneration of intervertebral disc of lumbosacral region 08/23/2015   Generalized abdominal pain 08/23/2015   Class 2 severe obesity due to excess calories with serious comorbidity and body mass index (BMI) of 38.0 to 38.9 in adult (HCC) 08/23/2015   Type 2 diabetes mellitus without complications (HCC) 08/23/2015   Pain in lower limb 12/07/2013   Plantar fasciitis of left foot 11/01/2013   Metatarsal deformity 11/01/2013   Diabetes (HCC) 11/01/2013   Type 2 diabetes mellitus without complication, with long-term current use of insulin (HCC) 11/01/2013   DEHYDRATION 01/26/2009   DYSTHYMIC DISORDER 01/26/2009   ATHEROSLERO  NATV ART EXTREM W/INTERMIT CLAUDICAT 01/26/2009   CLAUDICATION 01/26/2009   DYSPNEA 01/26/2009   CHEST PAIN UNSPECIFIED 01/26/2009   Past Medical History:  Diagnosis Date   AKI (acute kidney injury) (HCC) 11/11/2022   Anemia of unknown etiology    Anxiety disorder    Arthritis    ATHEROSLERO NATV ART EXTREM W/INTERMIT CLAUDICAT 01/26/2009   Qualifier: Diagnosis of  By: Valinda Party RN, Nancy     Back pain of lumbar region with sciatica 01/31/2021   Benign prostatic hyperplasia with urinary frequency    CHEST PAIN UNSPECIFIED 01/26/2009   Qualifier: Diagnosis of  By: Valinda Party RN, Harriett Sine     Class 2 severe obesity due to excess calories with serious comorbidity and body mass index (  BMI) of 38.0 to 38.9 in adult [facility])    CLAUDICATION 01/26/2009   Qualifier: Diagnosis of  By: Valinda Party RN, Nancy     Colonic mass 03/04/2022   Coronary artery disease    minimal CAD '12; NL stress test 06/10/14 ([facility])   Coronary artery disease of native artery of native heart with stable angina pectoris (HCC)    Degeneration of intervertebral disc of lumbosacral region 08/23/2015   Degenerative disc disease, lumbar    Degenerative lumbar disc 11/01/2020   Formatting of this note might be different from the original. Was recommended back surgery. Holding out.   Dehydration 01/26/2009   Qualifier: Diagnosis of  By: Valinda Party RN, Nancy     Depression    Diabetes (HCC) 11/01/2013   Diabetes mellitus (HCC) 04/03/2022   Diabetes mellitus without complication (HCC)    DYSPNEA 01/26/2009   Qualifier: Diagnosis of  By: Valinda Party RN, Harriett Sine     Dysthymic disorder 01/26/2009   Qualifier: Diagnosis of  By: Valinda Party RN, Nancy     Elevated alkaline phosphatase level 11/27/2020   Epididymoorchitis 12/31/2020   Essential (primary) hypertension 08/23/2015   Family history of adverse reaction to anesthesia    "it made my son sick"   Gangrene of toe of right foot (HCC) 11/11/2022   Generalized abdominal pain 08/23/2015    GERD (gastroesophageal reflux disease)    History of arthroplasty of right knee    Hyperlipemia    Iron deficiency anemia due to chronic blood loss 11/07/2020   Malaise and fatigue 06/28/2020   Metatarsal deformity 11/01/2013   Mild intermittent asthma without complication    Mixed hyperlipidemia    Moderate episode of recurrent major depressive disorder (HCC)    Morbid obesity (HCC) 08/23/2015   Myocardial infarction (HCC)    2012; hospitalized [facility] for chest pain syndrome 02/2011 and LHC showed trivial CAD   Noncompliance 05/30/2021   Pain in lower limb 12/07/2013   Plantar fasciitis of left foot 11/01/2013   Pneumonia 08/2016   surgery had to be rescheduled due to pneumonia   Primary insomnia 12/19/2021   Formatting of this note might be different from the original.  Avoid Rx   Primary osteoarthritis involving multiple joints 11/01/2020   Formatting of this note might be different from the original. both knees replaced.   Primary osteoarthritis of left knee 09/06/2016   Primary osteoarthritis of right knee 09/30/2015   Psoriasis    Sepsis, unspecified organism (HCC) 10/20/2015   Skin ulcer of right great toe (HCC) 01/07/2022   Status post partial colectomy 10/04/2022   Type 2 diabetes mellitus with diabetic polyneuropathy, with long-term current use of insulin (HCC) 04/03/2022   Type 2 diabetes mellitus with hyperglycemia, with long-term current use of insulin (HCC) 04/03/2022   Type 2 diabetes mellitus without complication, with long-term current use of insulin (HCC) 11/01/2013   Type 2 diabetes mellitus without complications (HCC) 08/23/2015   Uncomplicated opioid dependence (HCC)    Vitamin B12 deficiency 11/02/2020    Family History  Problem Relation Age of Onset   Cancer Mother    Diabetes Mother    Hyperlipidemia Father    Hypertension Father    Heart failure Father    Heart failure Brother     Past Surgical History:  Procedure Laterality Date   AMPUTATION Right  11/15/2022   Procedure: RIGHT TRANSMETATARSAL AMPUTATION;  Surgeon: Nadara Mustard, MD;  Location[facility] OR;  Service: Orthopedics;  Laterality: Right;   AMPUTATION Right 12/20/2022  Procedure: RIGHT CHOPART AMPUTATION;  Surgeon: Nadara Mustard, MD;  Location: [facility] OR;  Service: Orthopedics;  Laterality: Right;   CARDIAC CATHETERIZATION     03/28/11 LHC: NL LM, LAD; 10% mCX, mRCA. EF 65%. (Dr. Dot Been, [facility])   CHOLECYSTECTOMY     CORONARY ANGIOPLASTY     2012 [facility]    ESOPHAGEAL DILATION     SEVERAL TIMES   Heel Spur Resection with Fasiotomy Left 11/25/2013   @ [facility]   KNEE ARTHROSCOPY Bilateral    SHOULDER ARTHROSCOPY WITH OPEN ROTATOR CUFF REPAIR Right    TOTAL KNEE ARTHROPLASTY Right 10/04/2015   Procedure: RIGHT TOTAL KNEE ARTHROPLASTY;  Surgeon: Gean Birchwood, MD;  Location: [facility] OR;  Service: Orthopedics;  Laterality: Right;   TOTAL KNEE ARTHROPLASTY Left 10/14/2016   TOTAL KNEE ARTHROPLASTY Left 10/14/2016   Procedure: LEFT TOTAL KNEE ARTHROPLASTY;  Surgeon: Gean Birchwood, MD;  Location: [facility] OR;  Service: Orthopedics;  Laterality: Left;   TUMOR REMOVAL     FATTY TUMOR  RT SHOULDER   Social History   Occupational History   Not on file  Tobacco Use   Smoking status: Never   Smokeless tobacco: Never  Vaping Use   Vaping Use: Never used  Substance and Sexual Activity   Alcohol use: No   Drug use: No   Sexual activity: Not on file

## 2023-01-14 ENCOUNTER — Encounter (HOSPITAL_COMMUNITY): Payer: Self-pay | Admitting: Orthopedic Surgery

## 2023-01-14 ENCOUNTER — Other Ambulatory Visit: Payer: Self-pay

## 2023-01-14 NOTE — Progress Notes (Signed)
Mr. Viele denies chest pain or shortness of breath. Patient denies having any s/s of Covid in his household, also denies any known exposure to Covid. Mr. Gegg denies  any s/s of upper or lower respiratory in the past 8 weeks.   Mr. Triglia PCP is Dr. Feliciana Rossetti, cardiologist is Dr. Osborn Coho.  Mr. Embery has type II diabetes, patient reports that he checks CBG 3-4 times a day, it runs 95-140, last A1C  was 7.0 on 11/02/22. I instructed Mr. Sittner to not take Glipizide tonight or in am. In the am I instructed Mr. Reischman to not take metformin in am, if CBG is > 70 take 25 units  of Toujeo. I instructed patient to check CBG after awaking and every 2 hours until arrival  to the hospital. I Instructed Mr. Nylen if CBG is less than 70 to take 4 Glucose Tablets or 1 tube of Glucose Gel or 1/2 cup of a clear juice. Recheck CBG in 15 minutes if CBG is not over 70 call, pre- op desk at [phone] for further instructions. If scheduled to receive Insulin, do not take Insulin

## 2023-01-15 ENCOUNTER — Other Ambulatory Visit: Payer: Self-pay

## 2023-01-15 ENCOUNTER — Encounter (HOSPITAL_COMMUNITY): Payer: Self-pay | Admitting: Orthopedic Surgery

## 2023-01-15 ENCOUNTER — Encounter (HOSPITAL_COMMUNITY)
Admission: RE | Disposition: A | Discharge status: Home Health | Payer: Self-pay | Source: Home / Self Care | Attending: Orthopedic Surgery

## 2023-01-15 ENCOUNTER — Inpatient Hospital Stay (HOSPITAL_COMMUNITY)
Admission: RE | Admit: 2023-01-15 | Discharge: 2023-01-23 | DRG: 464 | Disposition: A | Discharge status: Home Health | Payer: Medicare Other | Attending: Orthopedic Surgery | Admitting: Orthopedic Surgery

## 2023-01-15 ENCOUNTER — Inpatient Hospital Stay (HOSPITAL_COMMUNITY): Payer: Medicare Other | Admitting: Anesthesiology

## 2023-01-15 DIAGNOSIS — Z9049 Acquired absence of other specified parts of digestive tract: Secondary | ICD-10-CM | POA: Diagnosis not present

## 2023-01-15 DIAGNOSIS — E1149 Type 2 diabetes mellitus with other diabetic neurological complication: Secondary | ICD-10-CM

## 2023-01-15 DIAGNOSIS — Z888 Allergy status to other drugs, medicaments and biological substances status: Secondary | ICD-10-CM

## 2023-01-15 DIAGNOSIS — Z8249 Family history of ischemic heart disease and other diseases of the circulatory system: Secondary | ICD-10-CM | POA: Diagnosis not present

## 2023-01-15 DIAGNOSIS — E782 Mixed hyperlipidemia: Secondary | ICD-10-CM | POA: Diagnosis present

## 2023-01-15 DIAGNOSIS — S88111A Complete traumatic amputation at level between knee and ankle, right lower leg, initial encounter: Secondary | ICD-10-CM

## 2023-01-15 DIAGNOSIS — Z5329 Procedure and treatment not carried out because of patient's decision for other reasons: Secondary | ICD-10-CM | POA: Diagnosis not present

## 2023-01-15 DIAGNOSIS — E1169 Type 2 diabetes mellitus with other specified complication: Secondary | ICD-10-CM | POA: Diagnosis present

## 2023-01-15 DIAGNOSIS — I10 Essential (primary) hypertension: Secondary | ICD-10-CM | POA: Diagnosis present

## 2023-01-15 DIAGNOSIS — E1151 Type 2 diabetes mellitus with diabetic peripheral angiopathy without gangrene: Secondary | ICD-10-CM | POA: Diagnosis not present

## 2023-01-15 DIAGNOSIS — K219 Gastro-esophageal reflux disease without esophagitis: Secondary | ICD-10-CM | POA: Diagnosis present

## 2023-01-15 DIAGNOSIS — I252 Old myocardial infarction: Secondary | ICD-10-CM

## 2023-01-15 DIAGNOSIS — I951 Orthostatic hypotension: Secondary | ICD-10-CM | POA: Diagnosis not present

## 2023-01-15 DIAGNOSIS — Y835 Amputation of limb(s) as the cause of abnormal reaction of the patient, or of later complication, without mention of misadventure at the time of the procedure: Secondary | ICD-10-CM | POA: Diagnosis present

## 2023-01-15 DIAGNOSIS — Z96653 Presence of artificial knee joint, bilateral: Secondary | ICD-10-CM | POA: Diagnosis present

## 2023-01-15 DIAGNOSIS — Z83438 Family history of other disorder of lipoprotein metabolism and other lipidemia: Secondary | ICD-10-CM

## 2023-01-15 DIAGNOSIS — T8781 Dehiscence of amputation stump: Secondary | ICD-10-CM | POA: Diagnosis present

## 2023-01-15 DIAGNOSIS — E1152 Type 2 diabetes mellitus with diabetic peripheral angiopathy with gangrene: Secondary | ICD-10-CM | POA: Diagnosis present

## 2023-01-15 DIAGNOSIS — E1142 Type 2 diabetes mellitus with diabetic polyneuropathy: Secondary | ICD-10-CM | POA: Diagnosis present

## 2023-01-15 DIAGNOSIS — J4520 Mild intermittent asthma, uncomplicated: Secondary | ICD-10-CM | POA: Diagnosis present

## 2023-01-15 DIAGNOSIS — I2510 Atherosclerotic heart disease of native coronary artery without angina pectoris: Secondary | ICD-10-CM | POA: Diagnosis present

## 2023-01-15 DIAGNOSIS — Z833 Family history of diabetes mellitus: Secondary | ICD-10-CM

## 2023-01-15 DIAGNOSIS — Z880 Allergy status to penicillin: Secondary | ICD-10-CM

## 2023-01-15 DIAGNOSIS — M86171 Other acute osteomyelitis, right ankle and foot: Secondary | ICD-10-CM | POA: Diagnosis present

## 2023-01-15 HISTORY — PX: AMPUTATION: SHX166

## 2023-01-15 HISTORY — DX: Polyneuropathy, unspecified: G62.9

## 2023-01-15 HISTORY — DX: Complete traumatic amputation at level between knee and ankle, right lower leg, initial encounter: S88.111A

## 2023-01-15 LAB — GLUCOSE, CAPILLARY
Glucose-Capillary: 227 mg/dL — ABNORMAL HIGH (ref 70–99)
Glucose-Capillary: 211 mg/dL — ABNORMAL HIGH (ref 70–99)
Glucose-Capillary: 161 mg/dL — ABNORMAL HIGH (ref 70–99)
Glucose-Capillary: 208 mg/dL — ABNORMAL HIGH (ref 70–99)
Glucose-Capillary: 150 mg/dL — ABNORMAL HIGH (ref 70–99)

## 2023-01-15 LAB — SURGICAL PCR SCREEN
MRSA, PCR: POSITIVE — AB
Staphylococcus aureus: POSITIVE — AB

## 2023-01-15 LAB — CBC
HCT: 33.5 % — ABNORMAL LOW (ref 39.0–52.0)
Hemoglobin: 10 g/dL — ABNORMAL LOW (ref 13.0–17.0)
MCH: 26 pg (ref 26.0–34.0)
MCHC: 29.9 g/dL — ABNORMAL LOW (ref 30.0–36.0)
MCV: 87.2 fL (ref 80.0–100.0)
Platelets: 462 10*3/uL — ABNORMAL HIGH (ref 150–400)
RBC: 3.84 MIL/uL — ABNORMAL LOW (ref 4.22–5.81)
RDW: 15.3 % (ref 11.5–15.5)
WBC: 10 10*3/uL (ref 4.0–10.5)
nRBC: 0 % (ref 0.0–0.2)

## 2023-01-15 LAB — BASIC METABOLIC PANEL
Anion gap: 13 (ref 5–15)
BUN: 19 mg/dL (ref 8–23)
CO2: 24 mmol/L (ref 22–32)
Calcium: 9.2 mg/dL (ref 8.9–10.3)
Chloride: 96 mmol/L — ABNORMAL LOW (ref 98–111)
Creatinine, Ser: 1.11 mg/dL (ref 0.61–1.24)
GFR, Estimated: >60 mL/min (ref >60)
Glucose, Bld: 221 mg/dL — ABNORMAL HIGH (ref 70–99)
Potassium: 4.5 mmol/L (ref 3.5–5.1)
Sodium: 133 mmol/L — ABNORMAL LOW (ref 135–145)

## 2023-01-15 SURGERY — AMPUTATION BELOW KNEE
Anesthesia: General | Site: Knee | Laterality: Right

## 2023-01-15 MED ORDER — OXYCODONE HCL 5 MG PO TABS: 5.0000 mg | ORAL_TABLET | Freq: Once | ORAL | Status: DC | PRN

## 2023-01-15 MED ORDER — ALBUTEROL SULFATE HFA 108 (90 BASE) MCG/ACT IN AERS: 2.0000 | INHALATION_SPRAY | Freq: Four times a day (QID) | RESPIRATORY_TRACT | Status: DC | PRN

## 2023-01-15 MED ORDER — OXYCODONE HCL 5 MG PO TABS
10.0000 mg | ORAL_TABLET | ORAL | Status: DC | PRN
  Administered 2023-01-15 – 2023-01-23 (×23): 15 mg via ORAL
  Filled 2023-01-15 (×24): qty 3

## 2023-01-15 MED ORDER — ONDANSETRON HCL 4 MG/2ML IJ SOLN
INTRAMUSCULAR | Status: DC | PRN
  Administered 2023-01-15: 4 mg via INTRAVENOUS

## 2023-01-15 MED ORDER — POTASSIUM CHLORIDE CRYS ER 20 MEQ PO TBCR: 20.0000 meq | EXTENDED_RELEASE_TABLET | Freq: Every day | ORAL | Status: DC | PRN

## 2023-01-15 MED ORDER — 0.9 % SODIUM CHLORIDE (POUR BTL) OPTIME
TOPICAL | Status: DC | PRN
  Administered 2023-01-15: 1000 mL

## 2023-01-15 MED ORDER — ONDANSETRON HCL 4 MG/2ML IJ SOLN: 4.0000 mg | Freq: Four times a day (QID) | INTRAMUSCULAR | Status: DC | PRN

## 2023-01-15 MED ORDER — CHLORHEXIDINE GLUCONATE 0.12 % MT SOLN
15.0000 mL | Freq: Once | OROMUCOSAL | Status: AC
  Administered 2023-01-15: 15 mL via OROMUCOSAL
  Filled 2023-01-15: qty 15

## 2023-01-15 MED ORDER — FENTANYL CITRATE (PF) 100 MCG/2ML IJ SOLN: 50.0000 ug | Freq: Once | INTRAMUSCULAR | Status: AC

## 2023-01-15 MED ORDER — PROPOFOL 10 MG/ML IV BOLUS
INTRAVENOUS | Status: AC
  Filled 2023-01-15: qty 20

## 2023-01-15 MED ORDER — BUPIVACAINE HCL (PF) 0.5 % IJ SOLN
INTRAMUSCULAR | Status: DC | PRN
  Administered 2023-01-15: 25 mL via PERINEURAL

## 2023-01-15 MED ORDER — INSULIN GLARGINE-YFGN 100 UNIT/ML ~~LOC~~ SOLN
30.0000 [IU] | Freq: Every day | SUBCUTANEOUS | Status: DC
  Administered 2023-01-15 – 2023-01-22 (×5): 30 [IU] via SUBCUTANEOUS
  Filled 2023-01-15 (×9): qty 0.3

## 2023-01-15 MED ORDER — ZINC SULFATE 220 (50 ZN) MG PO CAPS
220.0000 mg | ORAL_CAPSULE | Freq: Every day | ORAL | Status: DC
  Administered 2023-01-15 – 2023-01-23 (×9): 220 mg via ORAL
  Filled 2023-01-15 (×9): qty 1

## 2023-01-15 MED ORDER — EPHEDRINE SULFATE-NACL 50-0.9 MG/10ML-% IV SOSY
PREFILLED_SYRINGE | INTRAVENOUS | Status: DC | PRN
  Administered 2023-01-15 (×2): 5 mg via INTRAVENOUS

## 2023-01-15 MED ORDER — FENTANYL CITRATE (PF) 100 MCG/2ML IJ SOLN
INTRAMUSCULAR | Status: AC
  Administered 2023-01-15: 50 ug via INTRAVENOUS
  Filled 2023-01-15: qty 2

## 2023-01-15 MED ORDER — GLIPIZIDE 5 MG PO TABS
10.0000 mg | ORAL_TABLET | Freq: Two times a day (BID) | ORAL | Status: DC
  Administered 2023-01-15 – 2023-01-20 (×10): 10 mg via ORAL
  Filled 2023-01-15 (×11): qty 2

## 2023-01-15 MED ORDER — ROSUVASTATIN CALCIUM 5 MG PO TABS
10.0000 mg | ORAL_TABLET | Freq: Every morning | ORAL | Status: DC
  Administered 2023-01-16 – 2023-01-23 (×8): 10 mg via ORAL
  Filled 2023-01-15 (×9): qty 2

## 2023-01-15 MED ORDER — METFORMIN HCL 500 MG PO TABS
1000.0000 mg | ORAL_TABLET | Freq: Two times a day (BID) | ORAL | Status: DC
  Administered 2023-01-15 – 2023-01-23 (×16): 1000 mg via ORAL
  Filled 2023-01-15 (×16): qty 2

## 2023-01-15 MED ORDER — PANTOPRAZOLE SODIUM 40 MG PO TBEC
40.0000 mg | DELAYED_RELEASE_TABLET | Freq: Every day | ORAL | Status: DC
  Administered 2023-01-16 – 2023-01-23 (×8): 40 mg via ORAL
  Filled 2023-01-15 (×8): qty 1

## 2023-01-15 MED ORDER — FENTANYL CITRATE (PF) 100 MCG/2ML IJ SOLN
25.0000 ug | INTRAMUSCULAR | Status: DC | PRN
  Administered 2023-01-15: 25 ug via INTRAVENOUS
  Administered 2023-01-15: 50 ug via INTRAVENOUS
  Administered 2023-01-15: 25 ug via INTRAVENOUS

## 2023-01-15 MED ORDER — MUPIROCIN 2 % EX OINT
1.0000 | TOPICAL_OINTMENT | Freq: Two times a day (BID) | CUTANEOUS | Status: AC
  Administered 2023-01-15 – 2023-01-20 (×10): 1 via NASAL
  Filled 2023-01-15 (×2): qty 22

## 2023-01-15 MED ORDER — ACETAMINOPHEN 325 MG PO TABS
325.0000 mg | ORAL_TABLET | Freq: Four times a day (QID) | ORAL | Status: DC | PRN
  Administered 2023-01-17: 650 mg via ORAL
  Filled 2023-01-15: qty 2

## 2023-01-15 MED ORDER — LABETALOL HCL 5 MG/ML IV SOLN: 10.0000 mg | INTRAVENOUS | Status: DC | PRN

## 2023-01-15 MED ORDER — OXYCODONE HCL 5 MG PO TABS
5.0000 mg | ORAL_TABLET | ORAL | Status: DC | PRN
  Administered 2023-01-17 – 2023-01-19 (×3): 10 mg via ORAL
  Filled 2023-01-15 (×3): qty 2

## 2023-01-15 MED ORDER — POLYETHYLENE GLYCOL 3350 17 G PO PACK: 17.0000 g | PACK | Freq: Every day | ORAL | Status: DC | PRN

## 2023-01-15 MED ORDER — BUSPIRONE HCL 10 MG PO TABS
10.0000 mg | ORAL_TABLET | Freq: Every morning | ORAL | Status: DC
  Administered 2023-01-16 – 2023-01-23 (×8): 10 mg via ORAL
  Filled 2023-01-15 (×10): qty 1

## 2023-01-15 MED ORDER — MAGNESIUM SULFATE 2 GM/50ML IV SOLN: 2.0000 g | Freq: Every day | INTRAVENOUS | Status: DC | PRN

## 2023-01-15 MED ORDER — PAROXETINE HCL 20 MG PO TABS
40.0000 mg | ORAL_TABLET | Freq: Every day | ORAL | Status: DC
  Administered 2023-01-16 – 2023-01-23 (×8): 40 mg via ORAL
  Filled 2023-01-15 (×8): qty 2

## 2023-01-15 MED ORDER — HYDRALAZINE HCL 20 MG/ML IJ SOLN: 5.0000 mg | INTRAMUSCULAR | Status: DC | PRN

## 2023-01-15 MED ORDER — OXYCODONE HCL 5 MG/5ML PO SOLN: 5.0000 mg | Freq: Once | ORAL | Status: DC | PRN

## 2023-01-15 MED ORDER — CLONAZEPAM 0.5 MG PO TABS
1.0000 mg | ORAL_TABLET | Freq: Every day | ORAL | Status: DC
  Administered 2023-01-15 – 2023-01-22 (×8): 1 mg via ORAL
  Filled 2023-01-15 (×8): qty 2

## 2023-01-15 MED ORDER — JUVEN PO PACK
1.0000 | PACK | Freq: Two times a day (BID) | ORAL | Status: DC
  Administered 2023-01-15 – 2023-01-23 (×11): 1 via ORAL
  Filled 2023-01-15 (×12): qty 1

## 2023-01-15 MED ORDER — INSULIN ASPART 100 UNIT/ML IJ SOLN
0.0000 [IU] | INTRAMUSCULAR | Status: DC | PRN
  Administered 2023-01-15: 6 [IU] via SUBCUTANEOUS
  Filled 2023-01-15: qty 1

## 2023-01-15 MED ORDER — BUPROPION HCL 100 MG PO TABS
100.0000 mg | ORAL_TABLET | Freq: Every morning | ORAL | Status: DC
  Administered 2023-01-16 – 2023-01-23 (×8): 100 mg via ORAL
  Filled 2023-01-15 (×8): qty 1

## 2023-01-15 MED ORDER — PROPOFOL 10 MG/ML IV BOLUS
INTRAVENOUS | Status: DC | PRN
  Administered 2023-01-15: 40 mg via INTRAVENOUS
  Administered 2023-01-15: 160 mg via INTRAVENOUS

## 2023-01-15 MED ORDER — LACTATED RINGERS IV SOLN: INTRAVENOUS | Status: DC

## 2023-01-15 MED ORDER — CEFAZOLIN SODIUM-DEXTROSE 2-4 GM/100ML-% IV SOLN
2.0000 g | INTRAVENOUS | Status: AC
  Administered 2023-01-15: 2 g via INTRAVENOUS
  Filled 2023-01-15: qty 100

## 2023-01-15 MED ORDER — EPHEDRINE 5 MG/ML INJ
INTRAVENOUS | Status: AC
  Filled 2023-01-15: qty 5

## 2023-01-15 MED ORDER — PANTOPRAZOLE SODIUM 40 MG PO TBEC: 40.0000 mg | DELAYED_RELEASE_TABLET | Freq: Every day | ORAL | Status: DC

## 2023-01-15 MED ORDER — GUAIFENESIN-DM 100-10 MG/5ML PO SYRP: 15.0000 mL | ORAL_SOLUTION | ORAL | Status: DC | PRN

## 2023-01-15 MED ORDER — ORAL CARE MOUTH RINSE: 15.0000 mL | Freq: Once | OROMUCOSAL | Status: AC

## 2023-01-15 MED ORDER — GABAPENTIN 300 MG PO CAPS
600.0000 mg | ORAL_CAPSULE | Freq: Three times a day (TID) | ORAL | Status: DC
  Administered 2023-01-15 – 2023-01-23 (×24): 600 mg via ORAL
  Filled 2023-01-15 (×20): qty 2
  Filled 2023-01-15: qty 6
  Filled 2023-01-15 (×3): qty 2

## 2023-01-15 MED ORDER — MIDAZOLAM HCL 2 MG/2ML IJ SOLN: 1.0000 mg | Freq: Once | INTRAMUSCULAR | Status: AC

## 2023-01-15 MED ORDER — FENTANYL CITRATE (PF) 100 MCG/2ML IJ SOLN
INTRAMUSCULAR | Status: AC
  Filled 2023-01-15: qty 2

## 2023-01-15 MED ORDER — INSULIN ASPART 100 UNIT/ML IJ SOLN
4.0000 [IU] | Freq: Three times a day (TID) | INTRAMUSCULAR | Status: DC
  Administered 2023-01-15 – 2023-01-23 (×10): 4 [IU] via SUBCUTANEOUS

## 2023-01-15 MED ORDER — VITAMIN C 500 MG PO TABS
1000.0000 mg | ORAL_TABLET | Freq: Every day | ORAL | Status: DC
  Administered 2023-01-15 – 2023-01-23 (×9): 1000 mg via ORAL
  Filled 2023-01-15 (×9): qty 2

## 2023-01-15 MED ORDER — TAMSULOSIN HCL 0.4 MG PO CAPS
0.4000 mg | ORAL_CAPSULE | Freq: Two times a day (BID) | ORAL | Status: DC
  Administered 2023-01-15 – 2023-01-23 (×16): 0.4 mg via ORAL
  Filled 2023-01-15 (×16): qty 1

## 2023-01-15 MED ORDER — ASPIRIN 325 MG PO TABS
325.0000 mg | ORAL_TABLET | Freq: Every morning | ORAL | Status: DC
  Administered 2023-01-16 – 2023-01-23 (×8): 325 mg via ORAL
  Filled 2023-01-15 (×9): qty 1

## 2023-01-15 MED ORDER — ALUM & MAG HYDROXIDE-SIMETH 200-200-20 MG/5ML PO SUSP
15.0000 mL | ORAL | Status: DC | PRN
  Administered 2023-01-19: 30 mL via ORAL
  Filled 2023-01-15: qty 30

## 2023-01-15 MED ORDER — METOPROLOL SUCCINATE ER 25 MG PO TB24
25.0000 mg | ORAL_TABLET | Freq: Every morning | ORAL | Status: DC
  Administered 2023-01-16 – 2023-01-23 (×8): 25 mg via ORAL
  Filled 2023-01-15 (×9): qty 1

## 2023-01-15 MED ORDER — MAGNESIUM CITRATE PO SOLN: 1.0000 | Freq: Once | ORAL | Status: DC | PRN

## 2023-01-15 MED ORDER — SODIUM CHLORIDE 0.9 % IV SOLN: INTRAVENOUS | Status: DC

## 2023-01-15 MED ORDER — INSULIN ASPART 100 UNIT/ML IJ SOLN
0.0000 [IU] | Freq: Three times a day (TID) | INTRAMUSCULAR | Status: DC
  Administered 2023-01-15: 5 [IU] via SUBCUTANEOUS
  Administered 2023-01-16 – 2023-01-17 (×4): 3 [IU] via SUBCUTANEOUS
  Administered 2023-01-21 – 2023-01-22 (×5): 2 [IU] via SUBCUTANEOUS
  Administered 2023-01-22 – 2023-01-23 (×3): 3 [IU] via SUBCUTANEOUS

## 2023-01-15 MED ORDER — CEFAZOLIN SODIUM-DEXTROSE 2-4 GM/100ML-% IV SOLN
2.0000 g | Freq: Three times a day (TID) | INTRAVENOUS | Status: AC
  Administered 2023-01-15 – 2023-01-16 (×2): 2 g via INTRAVENOUS
  Filled 2023-01-15 (×2): qty 100

## 2023-01-15 MED ORDER — BISACODYL 5 MG PO TBEC: 5.0000 mg | DELAYED_RELEASE_TABLET | Freq: Every day | ORAL | Status: DC | PRN

## 2023-01-15 MED ORDER — ONDANSETRON HCL 4 MG/2ML IJ SOLN
INTRAMUSCULAR | Status: AC
  Filled 2023-01-15: qty 2

## 2023-01-15 MED ORDER — CHLORHEXIDINE GLUCONATE CLOTH 2 % EX PADS
6.0000 | MEDICATED_PAD | Freq: Every day | CUTANEOUS | Status: AC
  Administered 2023-01-16 – 2023-01-20 (×4): 6 via TOPICAL

## 2023-01-15 MED ORDER — METOPROLOL TARTRATE 5 MG/5ML IV SOLN: 2.0000 mg | INTRAVENOUS | Status: DC | PRN

## 2023-01-15 MED ORDER — DOCUSATE SODIUM 100 MG PO CAPS
100.0000 mg | ORAL_CAPSULE | Freq: Every day | ORAL | Status: DC
  Administered 2023-01-16 – 2023-01-23 (×8): 100 mg via ORAL
  Filled 2023-01-15 (×8): qty 1

## 2023-01-15 MED ORDER — MIDAZOLAM HCL 2 MG/2ML IJ SOLN
INTRAMUSCULAR | Status: AC
  Administered 2023-01-15: 1 mg via INTRAVENOUS
  Filled 2023-01-15: qty 2

## 2023-01-15 MED ORDER — HYDROMORPHONE HCL 1 MG/ML IJ SOLN
0.5000 mg | INTRAMUSCULAR | Status: DC | PRN
  Administered 2023-01-15 – 2023-01-23 (×23): 1 mg via INTRAVENOUS
  Filled 2023-01-15 (×26): qty 1

## 2023-01-15 MED ORDER — PHENOL 1.4 % MT LIQD: 1.0000 | OROMUCOSAL | Status: DC | PRN

## 2023-01-15 MED ORDER — LIDOCAINE 2% (20 MG/ML) 5 ML SYRINGE
INTRAMUSCULAR | Status: DC | PRN
  Administered 2023-01-15: 60 mg via INTRAVENOUS

## 2023-01-15 SURGICAL SUPPLY — 44 items
BAG COUNTER SPONGE SURGICOUNT (BAG) IMPLANT
BAG SPNG CNTER NS LX DISP (BAG)
BIT DRILL 3.2XOCPTL (BIT) ×1 IMPLANT
BIT DRL 3.2XOCPTL (BIT)
BLADE SAW RECIP 87.9 MT (BLADE) ×1 IMPLANT
BLADE SURG 21 STRL SS (BLADE) ×1 IMPLANT
BNDG CMPR 5X6 CHSV STRCH STRL (GAUZE/BANDAGES/DRESSINGS) ×1
BNDG COHESIVE 6X5 TAN ST LF (GAUZE/BANDAGES/DRESSINGS) IMPLANT
CANISTER WOUND CARE 500ML ATS (WOUND CARE) ×1 IMPLANT
COVER SURGICAL LIGHT HANDLE (MISCELLANEOUS) ×1 IMPLANT
CUFF TOURN SGL QUICK 34 (TOURNIQUET CUFF) ×1
CUFF TRNQT CYL 34X4.125X (TOURNIQUET CUFF) ×1 IMPLANT
DRAPE DERMATAC (DRAPES) IMPLANT
DRAPE INCISE IOBAN 66X45 STRL (DRAPES) ×1 IMPLANT
DRAPE U-SHAPE 47X51 STRL (DRAPES) ×1 IMPLANT
DRESSING PREVENA PLUS CUSTOM (GAUZE/BANDAGES/DRESSINGS) ×1 IMPLANT
DRILL BIT (BIT)
DRSG PREVENA PLUS CUSTOM (GAUZE/BANDAGES/DRESSINGS) ×1
DURAPREP 26ML APPLICATOR (WOUND CARE) ×1 IMPLANT
ELECT REM PT RETURN 9FT ADLT (ELECTROSURGICAL) ×1
ELECTRODE REM PT RTRN 9FT ADLT (ELECTROSURGICAL) ×1 IMPLANT
GLOVE BIOGEL PI IND STRL 9 (GLOVE) ×1 IMPLANT
GLOVE SURG ORTHO 9.0 STRL STRW (GLOVE) ×1 IMPLANT
GOWN STRL REUS W/ TWL XL LVL3 (GOWN DISPOSABLE) ×2 IMPLANT
GOWN STRL REUS W/TWL XL LVL3 (GOWN DISPOSABLE) ×2
GRAFT SKIN WND MICRO 38 (Tissue) IMPLANT
KIT BASIN OR (CUSTOM PROCEDURE TRAY) ×1 IMPLANT
KIT TURNOVER KIT B (KITS) ×1 IMPLANT
MANIFOLD NEPTUNE II (INSTRUMENTS) ×1 IMPLANT
NS IRRIG 1000ML POUR BTL (IV SOLUTION) ×1 IMPLANT
PACK ORTHO EXTREMITY (CUSTOM PROCEDURE TRAY) ×1 IMPLANT
PAD ARMBOARD 7.5X6 YLW CONV (MISCELLANEOUS) ×1 IMPLANT
PENCIL BUTTON HOLSTER BLD 10FT (ELECTRODE) IMPLANT
PREVENA RESTOR ARTHOFORM 46X30 (CANNISTER) ×1 IMPLANT
SPONGE T-LAP 18X18 ~~LOC~~+RFID (SPONGE) IMPLANT
STAPLER VISISTAT 35W (STAPLE) IMPLANT
STOCKINETTE IMPERVIOUS LG (DRAPES) ×1 IMPLANT
SUT ETHILON 2 0 PSLX (SUTURE) IMPLANT
SUT SILK 2 0 (SUTURE) ×1
SUT SILK 2-0 18XBRD TIE 12 (SUTURE) ×1 IMPLANT
SUT VIC AB 1 CTX 27 (SUTURE) ×2 IMPLANT
TOWEL GREEN STERILE (TOWEL DISPOSABLE) ×1 IMPLANT
TUBE CONNECTING 12X1/4 (SUCTIONS) ×1 IMPLANT
YANKAUER SUCT BULB TIP NO VENT (SUCTIONS) ×1 IMPLANT

## 2023-01-15 NOTE — Anesthesia Preprocedure Evaluation (Signed)
Anesthesia Evaluation  Patient identified by MRN, date of birth, ID band Patient awake    Reviewed: Allergy & Precautions, H&P , NPO status , Patient's Chart, lab work & pertinent test results  Airway Mallampati: II   Neck ROM: full    Dental   Pulmonary shortness of breath, asthma    breath sounds clear to auscultation       Cardiovascular hypertension, + CAD and + Peripheral Vascular Disease   Rhythm:regular Rate:Normal     Neuro/Psych  PSYCHIATRIC DISORDERS Anxiety Depression     Neuromuscular disease    GI/Hepatic ,GERD  ,,  Endo/Other  diabetes, Type 2    Renal/GU Renal InsufficiencyRenal disease     Musculoskeletal  (+) Arthritis ,    Abdominal   Peds  Hematology   Anesthesia Other Findings   Reproductive/Obstetrics                             Anesthesia Physical Anesthesia Plan  ASA: 3  Anesthesia Plan: General   Post-op Pain Management: Regional block*   Induction: Intravenous  PONV Risk Score and Plan: 2 and Ondansetron, Dexamethasone, Midazolam and Treatment may vary due to age or medical condition  Airway Management Planned: LMA  Additional Equipment:   Intra-op Plan:   Post-operative Plan: Extubation in OR  Informed Consent: I have reviewed the patients History and Physical, chart, labs and discussed the procedure including the risks, benefits and alternatives for the proposed anesthesia with the patient or authorized representative who has indicated his/her understanding and acceptance.     Dental advisory given  Plan Discussed with: CRNA, Anesthesiologist and Surgeon  Anesthesia Plan Comments:        Anesthesia Quick Evaluation

## 2023-01-15 NOTE — Anesthesia Procedure Notes (Signed)
Anesthesia Regional Block: Popliteal block   Pre-Anesthetic Checklist: , timeout performed,  Correct Patient, Correct Site, Correct Laterality,  Correct Procedure, Correct Position, site marked,  Risks and benefits discussed,  Surgical consent,  Pre-op evaluation,  At surgeon's request and post-op pain management  Laterality: Right  Prep: chloraprep       Needles:  Injection technique: Single-shot  Needle Type: Echogenic Stimulator Needle          Additional Needles:   Procedures:, nerve stimulator,,,,,     Nerve Stimulator or Paresthesia:  Response: plantar flexion of foot, 0.45 mA  Additional Responses:   Narrative:  Start time: 01/15/2023 10:38 AM End time: 01/15/2023 10:48 AM Injection made incrementally with aspirations every 5 mL.  Performed by: Personally  Anesthesiologist: Achille Rich, MD  Additional Notes: Functioning IV was confirmed and monitors were applied.  A 90mm 21ga Arrow echogenic stimulator needle was used. Sterile prep and drape,hand hygiene and sterile gloves were used.  Negative aspiration and negative test dose prior to incremental administration of local anesthetic. The patient tolerated the procedure well.  Ultrasound guidance: relevent anatomy identified, needle position confirmed, local anesthetic spread visualized around nerve(s), vascular puncture avoided.  Image printed for medical record.

## 2023-01-15 NOTE — H&P (Signed)
Kyle [name] is an 62 y.o. male.   Chief Complaint: Dehiscence wound right foot HPI: Patient is a 61 year old gentleman status post revision of a transmetatarsal amputation 2 weeks ago. Patient states he has been having increasing pain. Patient has had 90 narcotic pills in the past 17 days. He has had 180 pills in the last month. He is currently nonweightbearing on a kneeling scooter. He is on doxycycline.   Past Medical History:  Diagnosis Date   AKI (acute kidney injury) (HCC) 11/11/2022   01/14/23,  numbers improved   Anemia of unknown etiology    Anxiety disorder    Arthritis    ATHEROSLERO NATV ART EXTREM W/INTERMIT CLAUDICAT 01/26/2009   Qualifier: Diagnosis of  By: Valinda Party RN, Nancy     Back pain of lumbar region with sciatica 01/31/2021   Benign prostatic hyperplasia with urinary frequency    CHEST PAIN UNSPECIFIED 01/26/2009   Qualifier: Diagnosis of  By: Valinda Party RN, Nancy     Class 2 severe obesity due to excess calories with serious comorbidity and body mass index (BMI) of 38.0 to 38.9 in adult [facility])    CLAUDICATION 01/26/2009   Qualifier: Diagnosis of  By: Valinda Party RN, Nancy     Colonic mass 03/04/2022   Coronary artery disease    minimal CAD '12; NL stress test 06/10/14 ([facility])   Coronary artery disease of native artery of native heart with stable angina pectoris (HCC)    Degeneration of intervertebral disc of lumbosacral region 08/23/2015   Degenerative disc disease, lumbar    Degenerative lumbar disc 11/01/2020   Formatting of this note might be different from the original. Was recommended back surgery. Holding out.   Dehydration 01/26/2009   Qualifier: Diagnosis of  By: Valinda Party RN, Nancy     Depression    Diabetes mellitus without complication (HCC)    DYSPNEA 01/26/2009   Qualifier: Diagnosis of  By: Valinda Party RN, Harriett Sine     Dysthymic disorder 01/26/2009   Qualifier: Diagnosis of  By: Valinda Party RN, Nancy     Elevated alkaline phosphatase level 11/27/2020    Epididymoorchitis 12/31/2020   Essential (primary) hypertension 08/23/2015   Family history of adverse reaction to anesthesia    "it made my son sick"   Gangrene of toe of right foot (HCC) 11/11/2022   Generalized abdominal pain 08/23/2015   GERD (gastroesophageal reflux disease)    History of arthroplasty of right knee    Hyperlipemia    Iron deficiency anemia due to chronic blood loss 11/07/2020   Malaise and fatigue 06/28/2020   Metatarsal deformity 11/01/2013   Mild intermittent asthma without complication    Mixed hyperlipidemia    Moderate episode of recurrent major depressive disorder (HCC)    Morbid obesity (HCC) 08/23/2015   Myocardial infarction (HCC)    2012; hospitalized [facility] for chest pain syndrome 02/2011 and LHC showed trivial CAD   Neuropathy    Noncompliance 05/30/2021   Pain in lower limb 12/07/2013   Plantar fasciitis of left foot 11/01/2013   Pneumonia 08/2016   surgery had to be rescheduled due to pneumonia   Primary insomnia 12/19/2021   Formatting of this note might be different from the original.  Avoid Rx   Primary osteoarthritis involving multiple joints 11/01/2020   Formatting of this note might be different from the original. both knees replaced.   Primary osteoarthritis of left knee 09/06/2016   Primary osteoarthritis of right knee 09/30/2015   Psoriasis    Sepsis, unspecified organism (HCC) 10/20/2015  Skin ulcer of right great toe (HCC) 01/07/2022   Status post partial colectomy 10/04/2022   Type 2 diabetes mellitus with diabetic polyneuropathy, with long-term current use of insulin (HCC) 04/03/2022   Type 2 diabetes mellitus without complications (HCC) 08/23/2015   Uncomplicated opioid dependence (HCC)    Vitamin B12 deficiency 11/02/2020    Past Surgical History:  Procedure Laterality Date   AMPUTATION Right 11/15/2022   Procedure: RIGHT TRANSMETATARSAL AMPUTATION;  Surgeon: Nadara Mustard, MD;  Location: [facility] OR;  Service: Orthopedics;   Laterality: Right;   AMPUTATION Right 12/20/2022   Procedure: RIGHT CHOPART AMPUTATION;  Surgeon: Nadara Mustard, MD;  Location: [facility] OR;  Service: Orthopedics;  Laterality: Right;   CARDIAC CATHETERIZATION     03/28/11 LHC: NL LM, LAD; 10% mCX, mRCA. EF 65%. (Dr. Dot Been, [facility])   CHOLECYSTECTOMY     CORONARY ANGIOPLASTY     2012 [facility]    ESOPHAGEAL DILATION     SEVERAL TIMES   Heel Spur Resection with Fasiotomy Left 11/25/2013   @ [facility]   KNEE ARTHROSCOPY Bilateral    SHOULDER ARTHROSCOPY WITH OPEN ROTATOR CUFF REPAIR Right    TOTAL KNEE ARTHROPLASTY Right 10/04/2015   Procedure: RIGHT TOTAL KNEE ARTHROPLASTY;  Surgeon: Gean Birchwood, MD;  Location: [facility] OR;  Service: Orthopedics;  Laterality: Right;   TOTAL KNEE ARTHROPLASTY Left 10/14/2016   TOTAL KNEE ARTHROPLASTY Left 10/14/2016   Procedure: LEFT TOTAL KNEE ARTHROPLASTY;  Surgeon: Gean Birchwood, MD;  Location: [facility] OR;  Service: Orthopedics;  Laterality: Left;   TUMOR REMOVAL     FATTY TUMOR  RT SHOULDER    Family History  Problem Relation Age of Onset   Cancer Mother    Diabetes Mother    Hyperlipidemia Father    Hypertension Father    Heart failure Father    Heart failure Brother    Social History:  reports that he has never smoked. He has never used smokeless tobacco. He reports that he does not drink alcohol and does not use drugs.  Allergies:  Allergies  Allergen Reactions   Penicillins Hives, Swelling and Rash    Has patient had a PCN reaction causing immediate rash, facial/tongue/throat swelling, SOB or lightheadedness with hypotension: No Has patient had a PCN reaction causing severe rash involving mucus membranes or skin necrosis: No Has patient had a PCN reaction that required hospitalization No Has patient had a PCN reaction occurring within the last 10 years: No If all of the above answers are "NO", then may proceed with Cephalosporin use. ANCEF GIVEN ON 12-20-22 WITHOUT ISSUE    Cymbalta [Duloxetine Hcl] Other  (See Comments)    "makes me crazy" depression    No medications prior to admission.    No results found for this or any previous visit (from the past 48 hour(s)). No results found.  Review of Systems  All other systems reviewed and are negative.   There were no vitals taken for this visit. Physical Exam  Patient is alert, oriented, no adenopathy, well-dressed, normal affect, normal respiratory effort. Examination patient has progressive redness and dehiscence of the surgical's incision.  Patient has a palpable dorsalis pedis pulse.  There is no ascending cellulitis there is no purulent drainage.  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 Assessment/Plan 1. Dehiscence of amputation stump of right lower extremity (HCC)       Plan: With the increasing ischemic pain and have recommended proceeding with a transtibial amputation.  Will plan for  surgery Wednesday.  Discussed inpatient versus outpatient rehab.  Patient states he will be more interested in rehab at home  Nadara Mustard, MD 01/15/2023, 6:42 AM

## 2023-01-15 NOTE — Anesthesia Procedure Notes (Signed)
Procedure Name: LMA Insertion Date/Time: 01/15/2023 11:23 AM  Performed by: Gus Puma, CRNAPre-anesthesia Checklist: Patient identified, Emergency Drugs available, Suction available and Patient being monitored Patient Re-evaluated:Patient Re-evaluated prior to induction Oxygen Delivery Method: Circle System Utilized Preoxygenation: Pre-oxygenation with 100% oxygen Induction Type: IV induction Ventilation: Mask ventilation without difficulty LMA: LMA inserted LMA Size: 4.0 Number of attempts: 1 Airway Equipment and Method: Bite block Placement Confirmation: positive ETCO2 Tube secured with: Tape Dental Injury: Teeth and Oropharynx as per pre-operative assessment

## 2023-01-15 NOTE — Interval H&P Note (Signed)
History and Physical Interval Note:  01/15/2023 10:20 AM  [name]  has presented today for surgery, with the diagnosis of Dehiscence Right Foot Amputation.  The various methods of treatment have been discussed with the patient and family. After consideration of risks, benefits and other options for treatment, the patient has consented to  Procedure(s): RIGHT BELOW KNEE AMPUTATION (Right) as a surgical intervention.  The patient's history has been reviewed, patient examined, no change in status, stable for surgery.  I have reviewed the patient's chart and labs.  Questions were answered to the patient's satisfaction.     Kyle [name]

## 2023-01-15 NOTE — Transfer of Care (Signed)
Immediate Anesthesia Transfer of Care Note  Patient: Kyle [name]  Procedure(s) Performed: RIGHT BELOW KNEE AMPUTATION (Right: Knee)  Patient Location: PACU  Anesthesia Type:General and Regional  Level of Consciousness: awake, alert , and oriented  Airway & Oxygen Therapy: Patient Spontanous Breathing  Post-op Assessment: Report given to RN and Post -op Vital signs reviewed and stable  Post vital signs: Reviewed and stable  Last Vitals:  Vitals Value Taken Time  BP 124/87 01/15/23 1205  Temp    Pulse 70 01/15/23 1207  Resp 11 01/15/23 1207  SpO2 95 % 01/15/23 1207  Vitals shown include unvalidated device data.  Last Pain:  Vitals:   01/15/23 1105  TempSrc:   PainSc: 0-No pain      Patients Stated Pain Goal: 0 (01/15/23 1105)  Complications: No notable events documented.

## 2023-01-15 NOTE — Op Note (Signed)
01/15/2023  12:11 PM  PATIENT:  Kyle [name]    PRE-OPERATIVE DIAGNOSIS:  Dehiscence Right Foot Amputation  POST-OPERATIVE DIAGNOSIS:  Same  PROCEDURE:  RIGHT BELOW KNEE AMPUTATION Application of Kerecis micro graft 38 cm  Application of Prevena customizable and Prevena arthroform wound VAC dressings Application of Vive Wear stump shrinker and the Hanger limb protector  SURGEON:  Nadara Mustard, MD  ANESTHESIA:   General  PREOPERATIVE INDICATIONS:  Kyle [name] is a  62 y.o. male with a diagnosis of Dehiscence Right Foot Amputation who failed conservative measures and elected for surgical management.    The risks benefits and alternatives were discussed with the patient preoperatively including but not limited to the risks of infection, bleeding, nerve injury, cardiopulmonary complications, the need for revision surgery, among others, and the patient was willing to proceed.  OPERATIVE IMPLANTS:   Implant Name Type Inv. Item Serial No. Manufacturer Lot No. LRB No. Used Action  GRAFT SKIN WND MICRO 38 - [id number] Tissue GRAFT SKIN WND MICRO 38  [employer] [id number] Right 1 Implanted     OPERATIVE FINDINGS: Muscle with good petechial bleeding.   OPERATIVE PROCEDURE: Patient was brought to the operating room after undergoing a regional anesthetic.  After adequate levels anesthesia were obtained a thigh tourniquet was placed and the lower extremity was prepped using DuraPrep draped into a sterile field. The foot was draped out of the sterile field with impervious stockinette.  A timeout was called and the tourniquet inflated.  A transverse skin incision was made 12 cm distal to the tibial tubercle, the incision curved proximally, and a large posterior flap was created.  The tibia was transected just proximal to the skin incision and beveled anteriorly.  The fibula was transected just proximal to the tibial incision.  The sciatic nerve was pulled cut and allowed to retract.  The  vascular bundles were suture ligated with 2-0 silk.  The tourniquet was deflated and hemostasis obtained.      The Kerecis micro powder 38 cm was applied to the open wound that has a 200 cm surface area. The deep and superficial fascial layers were closed using #1 Vicryl.  The skin was closed using staples.    The Prevena customizable dressing was applied this was overwrapped with the arthroform sponge.  Kyle [name] was used to secure the sponges and the circumferential compression was secured to the skin with Dermatac.  This was connected to the wound VAC pump and had a good suction fit this was covered with a stump shrinker and a limb protector.  Patient was taken to the PACU in stable condition.   DISCHARGE PLANNING:  Antibiotic duration: 24-hour antibiotics  Weightbearing: Nonweightbearing on the operative extremity  Pain medication: Opioid pathway  Dressing care/ Wound VAC: Continue wound VAC with the Prevena plus pump at discharge for 1 week  Ambulatory devices: Walker or kneeling scooter  Discharge to: Discharge planning based on recommendations per physical therapy  Follow-up: In the office 1 week after discharge.

## 2023-01-15 NOTE — TOC Initial Note (Signed)
Transition of Care [facility]) - Initial/Assessment Note    Patient Details  Name: Kyle [name] MRN: [id number] Date of Birth: 01-27-1961  [facility] CM/SW Contact:    Epifanio Lesches, RN Phone Number: 01/15/2023, 3:59 PM  Clinical Narrative:                      - s/p RIGHT BELOW KNEE AMPUTATION, 6/19  PT/OT evaluations pending...   TOC  team following and will assist with needs.  Expected Discharge Plan: Home w Home Health Services (vs CIR vs SNF) Barriers to Discharge: Continued Medical Work up   Patient Goals and CMS Choice            Expected Discharge Plan and Services   Discharge Planning Services: CM Consult   Living arrangements for the past 2 months: Single Family Home                                      Prior Living Arrangements/Services Living arrangements for the past 2 months: Single Family Home Lives with:: Spouse   Do you feel safe going back to the place where you live?: Yes      Need for Family Participation in Patient Care: Yes (Comment) Care giver support system in place?: Yes (comment) Current home services: DME (knee scooter)    Activities of Daily Living Home Assistive Devices/Equipment: Eyeglasses, CBG Meter, Cane (specify quad or straight) (Knee Scooter) ADL Screening (condition at time of admission) Patient's cognitive ability adequate to safely complete daily activities?: Yes Is the patient deaf or have difficulty hearing?: No Does the patient have difficulty seeing, even when wearing glasses/contacts?: No Does the patient have difficulty concentrating, remembering, or making decisions?: No Patient able to express need for assistance with ADLs?: Yes Does the patient have difficulty dressing or bathing?: No Independently performs ADLs?: Yes (appropriate for developmental age) Does the patient have difficulty walking or climbing stairs?: No Weakness of Legs: Right Weakness of Arms/Hands: None  Permission  Sought/Granted   Permission granted to share information with : Yes, Verbal Permission Granted  Share Information with NAME: Kyle [name]  Spouse  [phone]           Emotional Assessment Appearance:: Appears stated age     Orientation: : Oriented to Self, Oriented to Place, Oriented to  Time, Oriented to Situation Alcohol / Substance Use: Not Applicable Psych Involvement: No (comment)  Admission diagnosis:  Below-knee amputation of right lower extremity (HCC) [W29.562Z] Patient Active Problem List   Diagnosis Date Noted   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   Dehiscence of amputation stump of right lower extremity (HCC) 12/20/2022   Gangrene of toe of right foot (HCC) 11/11/2022   AKI (acute kidney injury) (HCC) 11/11/2022   Status post partial colectomy 10/04/2022   Type 2 diabetes mellitus with hyperglycemia, with long-term current use of insulin (HCC) 04/03/2022   Type 2 diabetes mellitus with diabetic polyneuropathy, with long-term current use of insulin (HCC) 04/03/2022   Diabetes mellitus (HCC) 04/03/2022   Colonic mass 03/04/2022   Skin ulcer of right great toe (HCC) 01/07/2022   Primary insomnia 12/19/2021   Noncompliance 05/30/2021   Back pain of lumbar region with sciatica 01/31/2021   Epididymoorchitis 12/31/2020   Anemia of unknown etiology    Arthritis    Coronary artery disease    Degenerative disc disease, lumbar  Depression    Diabetes mellitus without complication (HCC)    GERD (gastroesophageal reflux disease)    Family history of adverse reaction to anesthesia    Hyperlipemia    Myocardial infarction (HCC)    Elevated alkaline phosphatase level 11/27/2020   Iron deficiency anemia due to chronic blood loss 11/07/2020   Vitamin B12 deficiency 11/02/2020   Anxiety disorder 11/01/2020   Benign prostatic hyperplasia with urinary frequency 11/01/2020   Coronary artery disease of native artery of native heart with stable  angina pectoris (HCC) 11/01/2020   Mild intermittent asthma without complication 11/01/2020   Moderate episode of recurrent major depressive disorder (HCC) 11/01/2020   Psoriasis 11/01/2020   Uncomplicated opioid dependence (HCC) 11/01/2020   Primary osteoarthritis involving multiple joints 11/01/2020   Degenerative lumbar disc 11/01/2020   Mixed hyperlipidemia 06/28/2020   Malaise and fatigue 06/28/2020   Pneumonia 08/2016   History of arthroplasty of right knee 10/20/2015   Sepsis, unspecified organism (HCC) 10/20/2015   Primary osteoarthritis of right knee 09/30/2015   Morbid obesity (HCC) 08/23/2015   Essential (primary) hypertension 08/23/2015   Degeneration of intervertebral disc of lumbosacral region 08/23/2015   Generalized abdominal pain 08/23/2015   Class 2 severe obesity due to excess calories with serious comorbidity and body mass index (BMI) of 38.0 to 38.9 in adult (HCC) 08/23/2015   Type 2 diabetes mellitus without complications (HCC) 08/23/2015   Pain in lower limb 12/07/2013   Plantar fasciitis of left foot 11/01/2013   Metatarsal deformity 11/01/2013   Diabetes (HCC) 11/01/2013   Type 2 diabetes mellitus without complication, with long-term current use of insulin (HCC) 11/01/2013   DEHYDRATION 01/26/2009   DYSTHYMIC DISORDER 01/26/2009   ATHEROSLERO NATV ART EXTREM W/INTERMIT CLAUDICAT 01/26/2009   CLAUDICATION 01/26/2009   DYSPNEA 01/26/2009   CHEST PAIN UNSPECIFIED 01/26/2009   PCP:  [name], MD Pharmacy:   [facility] [phone] Rosalita Levan, [place] - [address] AT [facility] [address] [place] [address] Phone: [phone] Fax: [phone]     Social Determinants of Health (SDOH) Social History: SDOH Screenings   Food Insecurity: No Food Insecurity (11/12/2022)  Housing: Low Risk  (11/12/2022)  Transportation Needs: No Transportation Needs (11/12/2022)  Utilities: Not At Risk (11/12/2022)   Tobacco Use: Low Risk  (01/15/2023)   SDOH Interventions:     Readmission Risk Interventions     No data to display

## 2023-01-16 ENCOUNTER — Ambulatory Visit: Payer: Medicare Other | Admitting: Orthopedic Surgery

## 2023-01-16 ENCOUNTER — Encounter (HOSPITAL_COMMUNITY): Payer: Self-pay | Admitting: Orthopedic Surgery

## 2023-01-16 LAB — GLUCOSE, CAPILLARY
Glucose-Capillary: 163 mg/dL — ABNORMAL HIGH (ref 70–99)
Glucose-Capillary: 177 mg/dL — ABNORMAL HIGH (ref 70–99)
Glucose-Capillary: 165 mg/dL — ABNORMAL HIGH (ref 70–99)
Glucose-Capillary: 101 mg/dL — ABNORMAL HIGH (ref 70–99)

## 2023-01-16 LAB — SURGICAL PATHOLOGY

## 2023-01-16 NOTE — Progress Notes (Signed)
Inpatient Rehabilitation Admissions Coordinator   I met with patient at bedside to review rehab venue options. He has not worked yet with therapy due to his  pain. He prefers to d/c directly home. I will follow up after therapy evaluations.  Ottie Glazier, RN, MSN Rehab Admissions Coordinator [phone] 01/16/2023 12:38 PM

## 2023-01-16 NOTE — Evaluation (Signed)
Physical Therapy Evaluation Patient Details Name: Kyle [name] MRN: [id number] DOB: Nov 24, 1960 Today's Date: 01/16/2023  History of Present Illness  62 year old gentleman Admitted 6/19 and underwent Rt BKA. PMH: coronary artery disease, insulin-dependent type 2 diabetes, hypertension, hyperlipidemia, anemia, psoriasis, chronic right foot ulcer.  Clinical Impression  Patient is s/p above surgery resulting in functional limitations due to the deficits listed below (see PT Problem List). Participated with significant encouragement. Agreeable to sit EOB and perform sit<>stand transfer. Managed to take several smalls steps along with EOB but refuses transfer to recliner, or gait training today. Agreeable to try tomorrow. Ind with rolling scooter PTA. Has assistance available from wife and friend at d/c 24/7. W/c ramp and w/c at home, needs a RW. Will progress as tolerated. Reviewed precautions, knee extension, LE exercises post-amputation. Patient will benefit from acute skilled PT to increase their independence and safety with mobility to facilitate discharge.        Recommendations for follow up therapy are one component of a multi-disciplinary discharge planning process, led by the attending physician.  Recommendations may be updated based on patient status, additional functional criteria and insurance authorization.     Assistance Recommended at Discharge Intermittent Supervision/Assistance  Patient can return home with the following  A little help with walking and/or transfers;A little help with bathing/dressing/bathroom;Assistance with cooking/housework;Assist for transportation;Help with stairs or ramp for entrance    Equipment Recommendations Rolling walker (2 wheels)     Functional Status Assessment Patient has had a recent decline in their functional status and demonstrates the ability to make significant improvements in function in a reasonable and predictable amount of time.      Precautions / Restrictions Precautions Precautions: Fall;Knee Precaution Comments: Reviewed amputee precautions Required Braces or Orthoses: Other Brace (ampushield) Restrictions Weight Bearing Restrictions: Yes RLE Weight Bearing: Non weight bearing      Mobility  Bed Mobility Overal bed mobility: Needs Assistance Bed Mobility: Supine to Sit, Sit to Supine     Supine to sit: Supervision Sit to supine: Supervision   General bed mobility comments: supervision for safety, requries extra time, no physical assist.    Transfers Overall transfer level: Needs assistance Equipment used: Rolling walker (2 wheels) Transfers: Sit to/from Stand Sit to Stand: Min guard           General transfer comment: Min guard for safety, cues for hand placement and technique, Stable with RW upon standing.    Ambulation/Gait Ambulation/Gait assistance: Min guard Gait Distance (Feet): 2 Feet Assistive device: Rolling walker (2 wheels) Gait Pattern/deviations:  (Lateral hop)     Pre-gait activities: Standing balance, weight shift heel lift Lt. General Gait Details: Performed lateral hop along bed. Declines gait training or transfer to recliner at this time. Min guard for safety, cues for technique and walker placement.  Stairs            Wheelchair Mobility    Modified Rankin (Stroke Patients Only)       Balance Overall balance assessment: Needs assistance Sitting-balance support: No upper extremity supported, Feet supported Sitting balance-Leahy Scale: Good     Standing balance support: Bilateral upper extremity supported Standing balance-Leahy Scale: Poor                               Pertinent Vitals/Pain      Home Living Family/patient expects to be discharged to:: Private residence Living Arrangements: Spouse/significant other Available Help at Discharge: Family;Available 24 hours/day (  wife and friend availabl.e) Type of Home: Mobile home Home  Access: Ramped entrance       Home Layout: One level Home Equipment: Wheelchair - manual;Other (comment) (knee scooter)      Prior Function Prior Level of Function : Independent/Modified Independent;Driving             Mobility Comments: mod I, using knee scooter since march. ADLs Comments: ind     Hand Dominance   Dominant Hand: Right    Extremity/Trunk Assessment   Upper Extremity Assessment Upper Extremity Assessment: Defer to OT evaluation    Lower Extremity Assessment Lower Extremity Assessment: RLE deficits/detail RLE Deficits / Details: shrinker in place, vac on.       Communication   Communication: No difficulties  Cognition Arousal/Alertness: Lethargic, Suspect due to medications Behavior During Therapy: WFL for tasks assessed/performed Overall Cognitive Status: Within Functional Limits for tasks assessed                                          General Comments General comments (skin integrity, edema, etc.): Reviewed precautions, need for OOB mobility, knee extension, ampushield use    Exercises Amputee Exercises Quad Sets: Strengthening, Both, 5 reps, Supine Knee Flexion: AROM, Right, 10 reps, Seated Knee Extension: AROM, Right, 10 reps, Seated   Assessment/Plan    PT Assessment Patient needs continued PT services  PT Problem List Decreased strength;Decreased range of motion;Decreased activity tolerance;Decreased balance;Decreased mobility;Decreased knowledge of use of DME;Decreased knowledge of precautions;Impaired sensation;Obesity;Pain       PT Treatment Interventions DME instruction;Gait training;Functional mobility training;Therapeutic activities;Therapeutic exercise;Balance training;Neuromuscular re-education;Patient/family education;Modalities;Wheelchair mobility training    PT Goals (Current goals can be found in the Care Plan section)  Acute Rehab PT Goals Patient Stated Goal: Go home PT Goal Formulation: With  patient Time For Goal Achievement: 01/23/23 Potential to Achieve Goals: Good    Frequency Min 4X/week     Co-evaluation               AM-PAC PT "6 Clicks" Mobility  Outcome Measure Help needed turning from your back to your side while in a flat bed without using bedrails?: None Help needed moving from lying on your back to sitting on the side of a flat bed without using bedrails?: None Help needed moving to and from a bed to a chair (including a wheelchair)?: A Little Help needed standing up from a chair using your arms (e.g., wheelchair or bedside chair)?: A Little Help needed to walk in hospital room?: A Little Help needed climbing 3-5 steps with a railing? : A Lot 6 Click Score: 19    End of Session Equipment Utilized During Treatment: Gait belt;Other (comment) (ampushield) Activity Tolerance: Other (comment);Patient limited by pain (Self limiting.) Patient left: in bed;with call bell/phone within reach;with bed alarm set;with family/visitor present Nurse Communication: Mobility status (NT 1 person assist) PT Visit Diagnosis: Unsteadiness on feet (R26.81);Muscle weakness (generalized) (M62.81);Difficulty in walking, not elsewhere classified (R26.2);Pain Pain - Right/Left: Right Pain - part of body: Leg    Time: 1610-9604 PT Time Calculation (min) (ACUTE ONLY): 21 min   Charges:   PT Evaluation $PT Eval Low Complexity: 1 Low          Kathlyn Sacramento, PT, DPT [facility]  Rehabilitation Services Physical Therapist Office: [phone] Website: [email or web address]   Berton Mount 01/16/2023, 4:26 PM

## 2023-01-16 NOTE — Progress Notes (Signed)
PT Cancellation Note  Patient Details Name: Kyle [name] MRN: [id number] DOB: December 29, 1960   Cancelled Treatment:    Reason Eval/Treat Not Completed: Pain limiting ability to participate  Second attempt at follow-up today. Pt awaiting additional pain medication and requests return later. Will attempt this afternoon as schedule allows.  Kyle [name], PT, DPT [facility]  Rehabilitation Services Physical Therapist Office: [phone] Website: [email or web address]  Kyle [name] 01/16/2023, 1:26 PM

## 2023-01-16 NOTE — Progress Notes (Signed)
OT Cancellation Note  Patient Details Name: Kyle [name] MRN: [id number] DOB: 10/09/1960   Cancelled Treatment:    Reason Eval/Treat Not Completed: Patient declined, no reason specified (Attempted to engage pt in therapy but reports too much pain to mobilize or don ampushield, RN notified pt is requesting pain medication. OT to continue to followup with patient as able.)   01/16/2023  AB, OTR/L  Acute Rehabilitation Services  Office: [phone]  Tristan Schroeder 01/16/2023, 3:38 PM

## 2023-01-16 NOTE — Progress Notes (Signed)
PT Cancellation Note  Patient Details Name: Indi Molinelli MRN: [id number] DOB: 1960-12-24   Cancelled Treatment:    Reason Eval/Treat Not Completed: Patient declined, no reason specified  Pt reports tired and in too much pain to participate this morning. Requests PT follow-up later.   Will try to evaluate a little later.  Kathlyn Sacramento, PT, DPT [facility]  Rehabilitation Services Physical Therapist Office: [phone] Website: [email or web address]   Berton Mount 01/16/2023, 10:21 AM

## 2023-01-16 NOTE — Progress Notes (Signed)
Patient ID: Kyle [name], male   DOB: 1960/11/08, 62 y.o.   MRN: [id number] Patient is a 62 year old gentleman who is postoperative day 1 below-knee amputation on the right.  There is no drainage in the wound VAC canister.  Patient states that he would like to discharge to home with home health therapy.  Will see how he progresses with therapy.

## 2023-01-17 LAB — GLUCOSE, CAPILLARY
Glucose-Capillary: 109 mg/dL — ABNORMAL HIGH (ref 70–99)
Glucose-Capillary: 120 mg/dL — ABNORMAL HIGH (ref 70–99)
Glucose-Capillary: 161 mg/dL — ABNORMAL HIGH (ref 70–99)
Glucose-Capillary: 63 mg/dL — ABNORMAL LOW (ref 70–99)
Glucose-Capillary: 67 mg/dL — ABNORMAL LOW (ref 70–99)

## 2023-01-17 NOTE — Progress Notes (Signed)
Physical Therapy Treatment Patient Details Name: Kyle [name] MRN: [id number] DOB: 12-Nov-1960 Today's Date: 01/17/2023   History of Present Illness 62 year old gentleman Admitted 6/19 and underwent Rt BKA. PMH: coronary artery disease, insulin-dependent type 2 diabetes, hypertension, hyperlipidemia, anemia, psoriasis, chronic right foot ulcer.    PT Comments    Limited by symptomatic hypotension when standing today. He did well mobilizing to EOB with supervision, and performed several sit<>stands from EOB at a min guard level but became dizzy each attempt (BP sitting EOB 103/61, Standing 93/70 (+dizziness), in recliner after transfer with LEs elevated 126/81.) Min guard for step pivot transfer to recliner, symptoms improved sitting upright with LEs elevated. Will benefit being out of bed today with periodic transfers with staff, utilizing RW for support. Has good family support at d/c, eager to go home but understands he is currently limited by his symptomatic hypotension. Anticipate he will ambulate well with walker (household distances,) once BP stabilizes. RN notified. Patient will continue to benefit from skilled physical therapy services to further improve independence with functional mobility. Will continue to progress as tolerated during admission.    Recommendations for follow up therapy are one component of a multi-disciplinary discharge planning process, led by the attending physician.  Recommendations may be updated based on patient status, additional functional criteria and insurance authorization.     Assistance Recommended at Discharge Intermittent Supervision/Assistance  Patient can return home with the following A little help with walking and/or transfers;A little help with bathing/dressing/bathroom;Assistance with cooking/housework;Assist for transportation;Help with stairs or ramp for entrance   Equipment Recommendations  Rolling walker (2 wheels)    Recommendations for Other  Services       Precautions / Restrictions Precautions Precautions: Fall;Knee Precaution Comments: Reviewed amputee precautions Required Braces or Orthoses: Other Brace Other Brace: ampushield Restrictions Weight Bearing Restrictions: Yes RLE Weight Bearing: Non weight bearing     Mobility  Bed Mobility Overal bed mobility: Needs Assistance Bed Mobility: Supine to Sit, Sit to Supine     Supine to sit: Supervision Sit to supine: Supervision   General bed mobility comments: supervision for safety, requries extra time, no physical assist.    Transfers Overall transfer level: Needs assistance Equipment used: Rolling walker (2 wheels) Transfers: Sit to/from Stand, Bed to chair/wheelchair/BSC Sit to Stand: Min guard   Step pivot transfers: Min guard       General transfer comment: Min guard for safety, cues for hand placement and technique, Stable with RW upon standing, performed 3x from bed. On third trial patient progressed with step pivot towards left side, close guard for safety, cues for sequencing and RW placement within BOS. Good strength, without buckling. Pt was limited by dizziness each time he stood. (see general comments below.)    Ambulation/Gait                   Stairs             Wheelchair Mobility    Modified Rankin (Stroke Patients Only)       Balance Overall balance assessment: Needs assistance Sitting-balance support: No upper extremity supported, Feet supported Sitting balance-Leahy Scale: Good     Standing balance support: Bilateral upper extremity supported Standing balance-Leahy Scale: Poor                              Cognition Arousal/Alertness: Awake/alert Behavior During Therapy: WFL for tasks assessed/performed Overall Cognitive Status: Within Functional Limits for tasks  assessed                                          Exercises Amputee Exercises Quad Sets: Strengthening, Both, 5  reps, Supine Knee Flexion: AROM, Right, 10 reps, Seated Knee Extension: AROM, Right, 10 reps, Seated    General Comments General comments (skin integrity, edema, etc.): BP sitting EOB 103/61, Standing 93/70 (+dizziness), in recliner after transfer with LEs elevated 126/81 symptoms improved.      Pertinent Vitals/Pain Pain Assessment Pain Assessment: Faces Faces Pain Scale: Hurts little more Pain Location: Rt residual limb with movement. Pain Descriptors / Indicators: Aching Pain Intervention(s): Monitored during session, Repositioned, Premedicated before session, Limited activity within patient's tolerance    Home Living Family/patient expects to be discharged to:: Private residence Living Arrangements: Spouse/significant other Available Help at Discharge: Family;Available 24 hours/day (wife and friend available)   Home Access: Ramped entrance       Home Layout: One level Home Equipment: Wheelchair - manual;Other (comment);Tub bench (knee scooter) Additional Comments: tub bench per pt report    Prior Function            PT Goals (current goals can now be found in the care plan section) Acute Rehab PT Goals Patient Stated Goal: Go home PT Goal Formulation: With patient Time For Goal Achievement: 01/23/23 Potential to Achieve Goals: Good Progress towards PT goals: Progressing toward goals    Frequency    Min 4X/week      PT Plan Current plan remains appropriate    Co-evaluation              AM-PAC PT "6 Clicks" Mobility   Outcome Measure  Help needed turning from your back to your side while in a flat bed without using bedrails?: None Help needed moving from lying on your back to sitting on the side of a flat bed without using bedrails?: None Help needed moving to and from a bed to a chair (including a wheelchair)?: A Little Help needed standing up from a chair using your arms (e.g., wheelchair or bedside chair)?: A Little Help needed to walk in  hospital room?: A Little Help needed climbing 3-5 steps with a railing? : A Lot 6 Click Score: 19    End of Session Equipment Utilized During Treatment: Gait belt;Other (comment) (ampushield) Activity Tolerance: Treatment limited secondary to medical complications (Comment) (Hypotension, + dizziness with standing.) Patient left: with call bell/phone within reach;in chair;with chair alarm set;with family/visitor present Nurse Communication: Mobility status (hypotension) PT Visit Diagnosis: Unsteadiness on feet (R26.81);Muscle weakness (generalized) (M62.81);Difficulty in walking, not elsewhere classified (R26.2);Pain Pain - Right/Left: Right Pain - part of body: Leg     Time: 1228-1300 PT Time Calculation (min) (ACUTE ONLY): 32 min  Charges:  $Therapeutic Activity: 23-37 mins                     Kathlyn Sacramento, PT, DPT [facility]  Rehabilitation Services Physical Therapist Office: [phone] Website: [email or web address]    Berton Mount 01/17/2023, 1:39 PM

## 2023-01-17 NOTE — Care Management Important Message (Signed)
Important Message  Patient Details  Name: Kyle [name] MRN: [id number] Date of Birth: 1960-11-12   Medicare Important Message Given:  Yes     [name] 01/17/2023, 12:36 PM

## 2023-01-17 NOTE — Anesthesia Postprocedure Evaluation (Signed)
Anesthesia Post Note  Patient: Kyle [name]  Procedure(s) Performed: RIGHT BELOW KNEE AMPUTATION (Right: Knee)     Patient location during evaluation: PACU Anesthesia Type: General and Regional Level of consciousness: awake and alert Pain management: pain level controlled Vital Signs Assessment: post-procedure vital signs reviewed and stable Respiratory status: spontaneous breathing, nonlabored ventilation, respiratory function stable and patient connected to nasal cannula oxygen Cardiovascular status: blood pressure returned to baseline and stable Postop Assessment: no apparent nausea or vomiting Anesthetic complications: no   No notable events documented.  Last Vitals:  Vitals:   01/16/23 2005 01/17/23 0438  BP:  (!) 153/67  Pulse:  (!) 108  Resp:  18  Temp:  37.1 C  SpO2: 92% 91%    Last Pain:  Vitals:   01/17/23 0438  TempSrc: Oral  PainSc:                  Sherelle Castelli S

## 2023-01-17 NOTE — Progress Notes (Signed)
Inpatient Rehabilitation Admissions Coordinator   We will sign off as patient prefers d/c directly home when medically ready.  Ottie Glazier, RN, MSN Rehab Admissions Coordinator [phone] 01/17/2023 6:05 PM

## 2023-01-17 NOTE — Evaluation (Signed)
Occupational Therapy Evaluation Patient Details Name: Kyle [name] MRN: [id number] DOB: 06/11/1961 Today's Date: 01/17/2023   History of Present Illness 62 year old gentleman Admitted 6/19 and underwent Rt BKA. PMH: coronary artery disease, insulin-dependent type 2 diabetes, hypertension, hyperlipidemia, anemia, psoriasis, chronic right foot ulcer.   Clinical Impression   Pt admitted for above dx, PTA patient reports being mod I/independent in mobility and bADLs. Pt currently presenting with R BKA which impacts balance, he needs Max A for LBD and min guard for mobility with hop to gait. Pt rec'd in recliner but reports he is too uncomfortable after about 30 mins of sitting and cannot tolerate it, found to have redness on R buttocks during session. Pt educated on pressure relief strategies and repositioned in sidelying once returned to bed. Pt would benefit from continued acute skilled OT services to address deficits and help transition to next level of care. Patient would benefit from post acute Home OT services to help maximize functional independence in natural environment        Recommendations for follow up therapy are one component of a multi-disciplinary discharge planning process, led by the attending physician.  Recommendations may be updated based on patient status, additional functional criteria and insurance authorization.   Assistance Recommended at Discharge Intermittent Supervision/Assistance  Patient can return home with the following A little help with walking and/or transfers;A lot of help with bathing/dressing/bathroom;Assist for transportation;Help with stairs or ramp for entrance;Assistance with cooking/housework    Functional Status Assessment  Patient has had a recent decline in their functional status and demonstrates the ability to make significant improvements in function in a reasonable and predictable amount of time.  Equipment Recommendations  Tub/shower bench (Pt  reports he has tub bench already, would need to clarify it is not a shower seat instead)    Recommendations for Other Services       Precautions / Restrictions Precautions Precautions: Fall;Knee Precaution Comments: Reviewed amputee precautions Required Braces or Orthoses: Other Brace Other Brace: ampushield Restrictions Weight Bearing Restrictions: Yes RLE Weight Bearing: Non weight bearing      Mobility Bed Mobility Overal bed mobility: Needs Assistance Bed Mobility: Rolling, Sit to Supine Rolling: Modified independent (Device/Increase time)     Sit to supine: Supervision   General bed mobility comments: Pt rec'd sitting in recliner, left sidelying in bed    Transfers Overall transfer level: Needs assistance Equipment used: Rolling walker (2 wheels) Transfers: Sit to/from Stand, Bed to chair/wheelchair/BSC Sit to Stand: Min guard     Step pivot transfers: Min guard            Balance Overall balance assessment: Needs assistance Sitting-balance support: No upper extremity supported, Feet supported Sitting balance-Leahy Scale: Good     Standing balance support: Bilateral upper extremity supported Standing balance-Leahy Scale: Poor                             ADL either performed or assessed with clinical judgement   ADL Overall ADL's : Needs assistance/impaired Eating/Feeding: Independent;Sitting   Grooming: Sitting;Set up;Supervision/safety   Upper Body Bathing: Independent;Sitting   Lower Body Bathing: Sitting/lateral leans;Moderate assistance   Upper Body Dressing : Sitting;Independent   Lower Body Dressing: Sitting/lateral leans;Maximal assistance   Toilet Transfer: Min guard;Stand-pivot;Rolling walker (2 wheels)   Toileting- Clothing Manipulation and Hygiene: Min guard       Functional mobility during ADLs: Min guard;Rolling walker (2 wheels)       Vision  Perception     Praxis      Pertinent Vitals/Pain Pain  Assessment Pain Assessment: Faces Faces Pain Scale: Hurts even more Pain Location: R BKA Pain Descriptors / Indicators: Aching, Discomfort, Sore Pain Intervention(s): Monitored during session, Repositioned     Hand Dominance Right   Extremity/Trunk Assessment Upper Extremity Assessment Upper Extremity Assessment: Overall WFL for tasks assessed   Lower Extremity Assessment RLE Deficits / Details: shrinker in place, vac on.       Communication Communication Communication: No difficulties   Cognition Arousal/Alertness: Lethargic Behavior During Therapy: WFL for tasks assessed/performed Overall Cognitive Status: Within Functional Limits for tasks assessed                                       General Comments  BP sitting EOB 103/61, Standing 93/70 (+dizziness), in recliner after transfer with LEs elevated 126/81 symptoms improved.    Exercises     Shoulder Instructions      Home Living Family/patient expects to be discharged to:: Private residence Living Arrangements: Spouse/significant other Available Help at Discharge: Family;Available 24 hours/day (wife and friend available)   Home Access: Ramped entrance     Home Layout: One level     Bathroom Shower/Tub: Chief Strategy Officer: Standard Bathroom Accessibility: Yes   Home Equipment: Wheelchair - Careers adviser (comment);Tub bench (knee scooter)   Additional Comments: tub bench per pt report      Prior Functioning/Environment Prior Level of Function : Independent/Modified Independent;Driving             Mobility Comments: mod I, using knee scooter since march. ADLs Comments: ind        OT Problem List: Pain;Impaired balance (sitting and/or standing)      OT Treatment/Interventions: Therapeutic activities;Self-care/ADL training;Visual/perceptual remediation/compensation;Balance training;DME and/or AE instruction    OT Goals(Current goals can be found in the care plan  section) Acute Rehab OT Goals Patient Stated Goal: to go home OT Goal Formulation: With patient Time For Goal Achievement: 01/31/23 Potential to Achieve Goals: Good ADL Goals Pt Will Perform Lower Body Bathing: sitting/lateral leans;with modified independence Pt Will Transfer to Toilet: ambulating;with supervision Pt Will Perform Tub/Shower Transfer: with supervision Additional ADL Goal #1: Pt will independently don/doff ampu shield  OT Frequency: Min 2X/week    Co-evaluation              AM-PAC OT "6 Clicks" Daily Activity     Outcome Measure Help from another person eating meals?: None Help from another person taking care of personal grooming?: A Little Help from another person toileting, which includes using toliet, bedpan, or urinal?: A Little Help from another person bathing (including washing, rinsing, drying)?: A Little Help from another person to put on and taking off regular upper body clothing?: A Little Help from another person to put on and taking off regular lower body clothing?: A Lot 6 Click Score: 18   End of Session Equipment Utilized During Treatment: Gait belt;Rolling walker (2 wheels) Nurse Communication: Mobility status  Activity Tolerance: Patient tolerated treatment well Patient left: in bed;with call bell/phone within reach;with family/visitor present  OT Visit Diagnosis: Unsteadiness on feet (R26.81);Other abnormalities of gait and mobility (R26.89);Pain Pain - Right/Left: Right Pain - part of body: Leg                Time: 1311-1330 OT Time Calculation (min): 19 min Charges:  OT  General Charges $OT Visit: 1 Visit OT Evaluation $OT Eval Moderate Complexity: 1 Mod  01/17/2023  AB, OTR/L  Acute Rehabilitation Services  Office: [phone]   Tristan Schroeder 01/17/2023, 1:41 PM

## 2023-01-18 LAB — GLUCOSE, CAPILLARY
Glucose-Capillary: 64 mg/dL — ABNORMAL LOW (ref 70–99)
Glucose-Capillary: 69 mg/dL — ABNORMAL LOW (ref 70–99)
Glucose-Capillary: 62 mg/dL — ABNORMAL LOW (ref 70–99)
Glucose-Capillary: 73 mg/dL (ref 70–99)
Glucose-Capillary: 120 mg/dL — ABNORMAL HIGH (ref 70–99)
Glucose-Capillary: 93 mg/dL (ref 70–99)
Glucose-Capillary: 93 mg/dL (ref 70–99)
Glucose-Capillary: 60 mg/dL — ABNORMAL LOW (ref 70–99)
Glucose-Capillary: 100 mg/dL — ABNORMAL HIGH (ref 70–99)

## 2023-01-18 MED ORDER — GLUCAGON HCL RDNA (DIAGNOSTIC) 1 MG IJ SOLR
INTRAMUSCULAR | Status: AC
  Filled 2023-01-18: qty 1

## 2023-01-18 MED ORDER — DEXTROSE 50 % IV SOLN
INTRAVENOUS | Status: AC
  Administered 2023-01-18: 50 mL
  Filled 2023-01-18: qty 50

## 2023-01-18 MED ORDER — GLUCOSE 40 % PO GEL
ORAL | Status: AC
  Administered 2023-01-18: 37.5 g
  Filled 2023-01-18: qty 1.21

## 2023-01-18 NOTE — Progress Notes (Signed)
Physical Therapy Treatment Patient Details Name: Jhaden Pizzuto MRN: [id number] DOB: 09/27/1960 Today's Date: 01/18/2023   History of Present Illness 62 year old gentleman Admitted 6/19 and underwent Rt BKA. PMH: coronary artery disease, insulin-dependent type 2 diabetes, hypertension, hyperlipidemia, anemia, psoriasis, chronic right foot ulcer.    PT Comments    Pt supine in bed on arrival this session.  RN reports he has been sleeping all morning into early afternoon.  Pt was awake when entering.  He continues to present with hypotension upon sitting but was able to recover and perform stand pivot x2 with RW.  Pt continues to strive for return home.    BP in supine-123/72 BP in sitting-93/74 (symptomatic) BP sitting after 3 min-126/71 BP post session- 134/80  Recommendations for follow up therapy are one component of a multi-disciplinary discharge planning process, led by the attending physician.  Recommendations may be updated based on patient status, additional functional criteria and insurance authorization.  Follow Up Recommendations       Assistance Recommended at Discharge Intermittent Supervision/Assistance  Patient can return home with the following A little help with walking and/or transfers;A little help with bathing/dressing/bathroom;Assistance with cooking/housework;Assist for transportation;Help with stairs or ramp for entrance   Equipment Recommendations  Rolling walker (2 wheels);Wheelchair (measurements PT);Wheelchair cushion (measurements PT)    Recommendations for Other Services       Precautions / Restrictions Precautions Precautions: Fall;Knee Precaution Comments: Reviewed amputee precautions Required Braces or Orthoses: Other Brace Other Brace: ampushield Restrictions Weight Bearing Restrictions: Yes RLE Weight Bearing: Non weight bearing     Mobility  Bed Mobility Overal bed mobility: Needs Assistance Bed Mobility: Supine to Sit, Sit to Supine      Supine to sit: Min assist Sit to supine: Supervision   General bed mobility comments: MIn assistance for trunk elevation into sitting.  He required cues for LE advancement and moving LEs to edge of bed.  He was able to return to supine without support.    Transfers Overall transfer level: Needs assistance Equipment used: Rolling walker (2 wheels) Transfers: Sit to/from Stand, Bed to chair/wheelchair/BSC Sit to Stand: Min guard Stand pivot transfers: Min guard, Min assist (min assistance to move device during turns and backing.)         General transfer comment: Min A to MinG this am moving into standing and performing pivot to and from [facility] where patient had successful BM.  No true gt performed as patient was noted to pivot on supporting limb for turns backing and sidestepping.    Ambulation/Gait                   Stairs             Wheelchair Mobility    Modified Rankin (Stroke Patients Only)       Balance     Sitting balance-Leahy Scale: Good       Standing balance-Leahy Scale: Poor                              Cognition Arousal/Alertness: Awake/alert Behavior During Therapy: WFL for tasks assessed/performed Overall Cognitive Status: Within Functional Limits for tasks assessed                                          Exercises      General Comments  Pertinent Vitals/Pain Pain Assessment Pain Assessment: Faces Pain Location: R BKA, and back ( chronic ) Pain Descriptors / Indicators: Aching, Discomfort, Sore Pain Intervention(s): Monitored during session, Repositioned, RN gave pain meds during session, Patient requesting pain meds-RN notified    Home Living                          Prior Function            PT Goals (current goals can now be found in the care plan section) Acute Rehab PT Goals Patient Stated Goal: Go home Potential to Achieve Goals: Good Progress towards PT goals:  Progressing toward goals    Frequency    Min 4X/week      PT Plan Current plan remains appropriate    Co-evaluation              AM-PAC PT "6 Clicks" Mobility   Outcome Measure  Help needed turning from your back to your side while in a flat bed without using bedrails?: None Help needed moving from lying on your back to sitting on the side of a flat bed without using bedrails?: A Little Help needed moving to and from a bed to a chair (including a wheelchair)?: A Little Help needed standing up from a chair using your arms (e.g., wheelchair or bedside chair)?: A Little Help needed to walk in hospital room?: A Little Help needed climbing 3-5 steps with a railing? : Total 6 Click Score: 17    End of Session Equipment Utilized During Treatment: Gait belt (ampushield)   Patient left: with call bell/phone within reach;with family/visitor present;in bed;with bed alarm set Nurse Communication: Mobility status (hypotension with position change but he does recover.) PT Visit Diagnosis: Unsteadiness on feet (R26.81);Muscle weakness (generalized) (M62.81);Difficulty in walking, not elsewhere classified (R26.2);Pain Pain - Right/Left: Right Pain - part of body: Leg     Time: 1610-9604 PT Time Calculation (min) (ACUTE ONLY): 30 min  Charges:  $Therapeutic Activity: 23-37 mins                     Bonney Leitz , PTA Acute Rehabilitation Services Office [phone]    Gradie Butrick Artis Delay 01/18/2023, 1:20 PM

## 2023-01-18 NOTE — Progress Notes (Signed)
Patient ID: Kyle [name], male   DOB: 1961/02/05, 62 y.o.   MRN: [id number] Patient is status post transtibial amputation.  He states he is feeling much better today.  Patient will work with physical therapy this weekend and anticipate discharge to home on Monday.

## 2023-01-18 NOTE — Progress Notes (Signed)
Patient's glucose has been low this evening.  2207 glucose was 67 (Glucerna given)  2353 glucose was 63 (snacks crackers and peanut butter, juice)  0131 glucose was 69  0219 glucose 64 (Glucose gel given)  Patient is asymptomatic.  Alert and orient x 4.  0321 glucose 62 (Dextrose 50% solution given)   0502 glucose 73

## 2023-01-19 LAB — GLUCOSE, CAPILLARY
Glucose-Capillary: 102 mg/dL — ABNORMAL HIGH (ref 70–99)
Glucose-Capillary: 100 mg/dL — ABNORMAL HIGH (ref 70–99)
Glucose-Capillary: 79 mg/dL (ref 70–99)
Glucose-Capillary: 50 mg/dL — ABNORMAL LOW (ref 70–99)
Glucose-Capillary: 178 mg/dL — ABNORMAL HIGH (ref 70–99)

## 2023-01-19 MED ORDER — DEXTROSE 50 % IV SOLN: 1.0000 | Freq: Once | INTRAVENOUS | Status: AC

## 2023-01-19 MED ORDER — DEXTROSE 50 % IV SOLN
INTRAVENOUS | Status: AC
  Administered 2023-01-19: 50 mL
  Filled 2023-01-19: qty 50

## 2023-01-19 MED ORDER — TEMAZEPAM 15 MG PO CAPS
15.0000 mg | ORAL_CAPSULE | Freq: Every evening | ORAL | Status: DC | PRN
  Administered 2023-01-20 – 2023-01-22 (×3): 15 mg via ORAL
  Filled 2023-01-19 (×3): qty 1

## 2023-01-19 NOTE — Plan of Care (Signed)
  Problem: Education: Goal: Ability to describe self-care measures that may prevent or decrease complications (Diabetes Survival Skills Education) will improve Outcome: Progressing Goal: Individualized Educational Video(s) Outcome: Progressing   Problem: Coping: Goal: Ability to adjust to condition or change in health will improve Outcome: Progressing   Problem: Fluid Volume: Goal: Ability to maintain a balanced intake and output will improve Outcome: Progressing   Problem: Health Behavior/Discharge Planning: Goal: Ability to identify and utilize available resources and services will improve Outcome: Progressing Goal: Ability to manage health-related needs will improve Outcome: Progressing   Problem: Metabolic: Goal: Ability to maintain appropriate glucose levels will improve Outcome: Progressing   Problem: Nutritional: Goal: Maintenance of adequate nutrition will improve Outcome: Progressing Goal: Progress toward achieving an optimal weight will improve Outcome: Progressing   Problem: Skin Integrity: Goal: Risk for impaired skin integrity will decrease Outcome: Progressing   Problem: Tissue Perfusion: Goal: Adequacy of tissue perfusion will improve Outcome: Progressing   Problem: Education: Goal: Knowledge of the prescribed therapeutic regimen will improve Outcome: Progressing Goal: Ability to verbalize activity precautions or restrictions will improve Outcome: Progressing Goal: Understanding of discharge needs will improve Outcome: Progressing   Problem: Activity: Goal: Ability to perform//tolerate increased activity and mobilize with assistive devices will improve Outcome: Progressing   Problem: Clinical Measurements: Goal: Postoperative complications will be avoided or minimized Outcome: Progressing   Problem: Self-Care: Goal: Ability to meet self-care needs will improve Outcome: Progressing   Problem: Self-Concept: Goal: Ability to maintain and perform  role responsibilities to the fullest extent possible will improve Outcome: Progressing   Problem: Pain Management: Goal: Pain level will decrease with appropriate interventions Outcome: Progressing   Problem: Skin Integrity: Goal: Demonstration of wound healing without infection will improve Outcome: Progressing   Problem: Education: Goal: Knowledge of General Education information will improve Description: Including pain rating scale, medication(s)/side effects and non-pharmacologic comfort measures Outcome: Progressing   Problem: Health Behavior/Discharge Planning: Goal: Ability to manage health-related needs will improve Outcome: Progressing   Problem: Clinical Measurements: Goal: Ability to maintain clinical measurements within normal limits will improve Outcome: Progressing Goal: Will remain free from infection Outcome: Progressing Goal: Diagnostic test results will improve Outcome: Progressing Goal: Respiratory complications will improve Outcome: Progressing Goal: Cardiovascular complication will be avoided Outcome: Progressing   Problem: Activity: Goal: Risk for activity intolerance will decrease Outcome: Progressing   Problem: Nutrition: Goal: Adequate nutrition will be maintained Outcome: Progressing   Problem: Coping: Goal: Level of anxiety will decrease Outcome: Progressing   Problem: Elimination: Goal: Will not experience complications related to bowel motility Outcome: Progressing Goal: Will not experience complications related to urinary retention Outcome: Progressing   Problem: Pain Managment: Goal: General experience of comfort will improve Outcome: Progressing   Problem: Safety: Goal: Ability to remain free from injury will improve Outcome: Progressing   Problem: Skin Integrity: Goal: Risk for impaired skin integrity will decrease Outcome: Progressing   

## 2023-01-19 NOTE — Progress Notes (Signed)
  Subjective: Patient stable.  Was out of bed to chair yesterday   Objective: Vital signs in last 24 hours: Temp:  [98.8 F (37.1 C)-99.5 F (37.5 C)] 99.5 F (37.5 C) (06/22 1955) Pulse Rate:  [91-100] 91 (06/23 0423) Resp:  [16-17] 16 (06/23 0423) BP: (103-122)/(60-76) 108/69 (06/23 0423) SpO2:  [91 %-99 %] 91 % (06/23 0423)  Intake/Output from previous day: 06/22 0701 - 06/23 0700 In: 600 [P.O.:600] Out: 1720 [Urine:1720] Intake/Output this shift: No intake/output data recorded.  Exam:  No cellulitis present Compartment soft  Labs: No results for input(s): "HGB" in the last 72 hours. No results for input(s): "WBC", "RBC", "HCT", "PLT" in the last 72 hours. No results for input(s): "NA", "K", "CL", "CO2", "BUN", "CREATININE", "GLUCOSE", "CALCIUM" in the last 72 hours. No results for input(s): "LABPT", "INR" in the last 72 hours.  Assessment/Plan: Plan at this time is patient is doing well following right BKA.  Pain is controlled.  Discharge likely this week   G Dorene Grebe 01/19/2023, 8:08 AM

## 2023-01-20 LAB — GLUCOSE, CAPILLARY
Glucose-Capillary: 119 mg/dL — ABNORMAL HIGH (ref 70–99)
Glucose-Capillary: 125 mg/dL — ABNORMAL HIGH (ref 70–99)
Glucose-Capillary: 120 mg/dL — ABNORMAL HIGH (ref 70–99)
Glucose-Capillary: 102 mg/dL — ABNORMAL HIGH (ref 70–99)

## 2023-01-20 NOTE — Progress Notes (Signed)
Occupational Therapy Treatment Patient Details Name: Kyle [name] MRN: [id number] DOB: 20-Oct-1960 Today's Date: 01/20/2023   History of present illness 62 year old gentleman Admitted 6/19 and underwent Rt BKA. PMH: coronary artery disease, insulin-dependent type 2 diabetes, hypertension, hyperlipidemia, anemia, psoriasis, chronic right foot ulcer.   OT comments  Patient with incremental progress toward patient focused goals.  Patient has limited insight into his deficits and abilities.  He is not balancing himself well standing, and does not have the upper body strength to support himself , along with his left leg for steps or longer transfers.  Provided education that patient will need to have transfer surfaces close to one another, and he will ned to have adequate assist to prevent falls.  He is a high fall risk, and at risk for further injury to his stump.  AIR or SNF for post acute rehab is recommended, but the patient refuses.  OT will continue efforts in the acute setting, and HH OT has been arranged for post acute rehab.      Recommendations for follow up therapy are one component of a multi-disciplinary discharge planning process, led by the attending physician.  Recommendations may be updated based on patient status, additional functional criteria and insurance authorization.    Assistance Recommended at Discharge Frequent or constant Supervision/Assistance  Patient can return home with the following  A little help with walking and/or transfers;A lot of help with bathing/dressing/bathroom;Assist for transportation;Help with stairs or ramp for entrance;Assistance with cooking/housework   Equipment Recommendations  BSC/3in1;Other (comment) (2 wheel walker and DROP ARM 3n1)    Recommendations for Other Services      Precautions / Restrictions Precautions Precautions: Fall;Knee Required Braces or Orthoses: Other Brace Other Brace: ampushield Restrictions Weight Bearing Restrictions:  Yes RLE Weight Bearing: Non weight bearing       Mobility Bed Mobility Overal bed mobility: Needs Assistance         Sit to supine: Supervision        Transfers Overall transfer level: Needs assistance Equipment used: Rolling walker (2 wheels) Transfers: Sit to/from Stand Sit to Stand: Min assist, From elevated surface Stand pivot transfers: Min assist   Step pivot transfers: Min assist           Balance Overall balance assessment: Needs assistance Sitting-balance support: No upper extremity supported, Feet supported Sitting balance-Leahy Scale: Good     Standing balance support: Bilateral upper extremity supported Standing balance-Leahy Scale: Poor                             ADL either performed or assessed with clinical judgement   ADL                       Lower Body Dressing: Moderate assistance;Sit to/from stand   Toilet Transfer: Rolling walker (2 wheels);BSC/3in1;Minimal assistance   Toileting- Clothing Manipulation and Hygiene: Maximal assistance;Sit to/from stand              Extremity/Trunk Assessment Upper Extremity Assessment Upper Extremity Assessment: Generalized weakness   Lower Extremity Assessment Lower Extremity Assessment: Defer to PT evaluation   Cervical / Trunk Assessment Cervical / Trunk Assessment: Kyphotic    Vision Baseline Vision/History: 1 Wears glasses Patient Visual Report: No change from baseline     Perception Perception Perception: Not tested   Praxis Praxis Praxis: Not tested    Cognition Arousal/Alertness: Awake/alert Behavior During Therapy: [facility] for tasks assessed/performed Overall Cognitive  Status: Impaired/Different from baseline Area of Impairment: Problem solving                         Safety/Judgement: Decreased awareness of safety, Decreased awareness of deficits   Problem Solving: Requires verbal cues, Requires tactile cues          Exercises       Shoulder Instructions       General Comments      Pertinent Vitals/ Pain       Pain Assessment Pain Assessment: Faces Faces Pain Scale: Hurts little more Pain Location: R BKA, and back ( chronic ) Pain Descriptors / Indicators: Aching, Discomfort, Sore Pain Intervention(s): Monitored during session  Home Living                                          Prior Functioning/Environment              Frequency  Min 2X/week        Progress Toward Goals  OT Goals(current goals can now be found in the care plan section)  Progress towards OT goals: Progressing toward goals  Acute Rehab OT Goals OT Goal Formulation: With patient Time For Goal Achievement: 01/31/23 Potential to Achieve Goals: Good  Plan Discharge plan remains appropriate    Co-evaluation                 AM-PAC OT "6 Clicks" Daily Activity     Outcome Measure   Help from another person eating meals?: None Help from another person taking care of personal grooming?: A Little Help from another person toileting, which includes using toliet, bedpan, or urinal?: A Lot Help from another person bathing (including washing, rinsing, drying)?: A Lot Help from another person to put on and taking off regular upper body clothing?: A Little Help from another person to put on and taking off regular lower body clothing?: A Lot 6 Click Score: 16    End of Session Equipment Utilized During Treatment: Gait belt;Rolling walker (2 wheels)  OT Visit Diagnosis: Unsteadiness on feet (R26.81);Other abnormalities of gait and mobility (R26.89);Pain Pain - Right/Left: Right   Activity Tolerance Patient tolerated treatment well   Patient Left in bed;with call bell/phone within reach;with family/visitor present   Nurse Communication Mobility status        Time: 1610-9604 OT Time Calculation (min): 21 min  Charges: OT General Charges $OT Visit: 1 Visit OT Treatments $Self Care/Home Management  : 8-22 mins  01/20/2023  RP, OTR/L  Acute Rehabilitation Services  Office:  [phone]   Suzanna Obey 01/20/2023, 3:14 PM

## 2023-01-20 NOTE — Progress Notes (Signed)
Hypoglycemic Event  CBG: 50  Treatment: D50 50 mL (25 gm)  Symptoms: None  Follow-up CBG: Time:2133 CBG Result:178  Possible Reasons for Event: Unknown  Comments/MD notified:Dr Aldean Baker Informed    Teola Bradley Ardene Remley

## 2023-01-20 NOTE — Consult Note (Signed)
   [facility] Inpatient Consult   01/20/2023  Kyle [name] [birth year] [id number]  [facility] Referral: High risk [name] list showing on banner  Primary Care Provider:  [name], MD [facility] is not a provider affiliated with [facility]  Insurance: [employer] is listed in demographic *Insurance listed on [facility] roster is with [employer]  Review that [facility] follows for TOC/care coordination per encounters.   Reason:   Patient's primary care provider is not an in-network provider with [facility] [[facility]] at this time and insurance not the same. Membership roster was used to verify patient status of provider unattributted and insurance listed with [facility] not the network.   Will sign off.   For additional questions or referrals please contact:    For questions, please call:  Charlesetta Shanks, RN BSN CCM [facility]  [phone] business mobile phone Toll free office [phone]  *Concierge Line  [phone] Fax number: [phone] [email or web address]

## 2023-01-20 NOTE — Progress Notes (Signed)
Physical Therapy Treatment Patient Details Name: Kyle [name] MRN: [id number] DOB: May 04, 1961 Today's Date: 01/20/2023   History of Present Illness 62 year old gentleman Admitted 6/19 and underwent Rt BKA. PMH: coronary artery disease, insulin-dependent type 2 diabetes, hypertension, hyperlipidemia, anemia, psoriasis, chronic right foot ulcer.    PT Comments    Pt supine in bed on arrival.  He is agreeable to PT session this am.  Progression of gt training attempted with close chair follow.  Pt fatigues quickly and sit impulsively without close chair follow this would have resulted in a fall.  Pt not phased by his weakness or near fall this am and still wants to d/c home with support of his friend who is sitting at his bedside.  Will update recommendations to reflect CIR despitye patient wishes as he truly would benefit from CIR and proves to be a fall risk at home, based on his performance with PT this am.      Recommendations for follow up therapy are one component of a multi-disciplinary discharge planning process, led by the attending physician.  Recommendations may be updated based on patient status, additional functional criteria and insurance authorization.  Follow Up Recommendations       Assistance Recommended at Discharge Intermittent Supervision/Assistance  Patient can return home with the following A little help with walking and/or transfers;A little help with bathing/dressing/bathroom;Assistance with cooking/housework;Assist for transportation;Help with stairs or ramp for entrance   Equipment Recommendations  Rolling walker (2 wheels);Wheelchair (measurements PT);Wheelchair cushion (measurements PT) (stated he has WC at home.  Unsure of it's condition.)    Recommendations for Other Services       Precautions / Restrictions Precautions Precautions: Fall;Knee Precaution Comments: Reviewed amputee precautions Required Braces or Orthoses: Other Brace Other Brace:  ampushield Restrictions Weight Bearing Restrictions: Yes RLE Weight Bearing: Non weight bearing     Mobility  Bed Mobility Overal bed mobility: Needs Assistance Bed Mobility: Sidelying to Sit, Rolling Rolling: Modified independent (Device/Increase time) Sidelying to sit: Mod assist       General bed mobility comments: Mod assistance to elevate trunk into a seated position from flat surface of bed to simulate home environment.  He remains limited in core.    Transfers Overall transfer level: Needs assistance Equipment used: Rolling walker (2 wheels) Transfers: Sit to/from Stand Sit to Stand: Min assist, From elevated surface           General transfer comment: Min assistance to rise into standing.  He required cues for posture and forward gaze,  Pt fatigues quickly and during stand to sit he sit impulsively with poor eccentric load.    Ambulation/Gait Ambulation/Gait assistance: Min assist, Mod assist, +2 safety/equipment Gait Distance (Feet): 8 Feet Assistive device: Rolling walker (2 wheels) Gait Pattern/deviations: Step-to pattern, Trunk flexed, Leaning posteriorly       General Gait Details: Pt performed forward hop to pattern this session.  He started off good but began to fatigue quickly and required increased assistance to return to recliner after L knee buckle and impulsive return to his recliner chair. Recliner chair following during session.   Stairs             Wheelchair Mobility    Modified Rankin (Stroke Patients Only)       Balance  Cognition Arousal/Alertness: Awake/alert Behavior During Therapy: WFL for tasks assessed/performed Overall Cognitive Status: Impaired/Different from baseline Area of Impairment: Safety/judgement                         Safety/Judgement: Decreased awareness of safety, Decreased awareness of deficits     General Comments: Pt reports he  still feels safe to return home despite near fall during progression of gt.  PTA able to save from fall and sit him in recliner as chair was following closely.        Exercises Amputee Exercises Quad Sets: AROM, Right, 10 reps, Supine Hip ABduction/ADduction: AROM, Right, 10 reps, Supine Straight Leg Raises: AROM, Right, 10 reps, Supine    General Comments        Pertinent Vitals/Pain Pain Assessment Pain Assessment: Faces Faces Pain Scale: Hurts even more Pain Location: R BKA, and back ( chronic ) Pain Descriptors / Indicators: Aching, Discomfort, Sore Pain Intervention(s): Monitored during session, Premedicated before session, Repositioned    Home Living                          Prior Function            PT Goals (current goals can now be found in the care plan section) Acute Rehab PT Goals Patient Stated Goal: Go home Potential to Achieve Goals: Fair Progress towards PT goals: Progressing toward goals    Frequency    Min 4X/week      PT Plan Discharge plan needs to be updated    Co-evaluation              AM-PAC PT "6 Clicks" Mobility   Outcome Measure  Help needed turning from your back to your side while in a flat bed without using bedrails?: None Help needed moving from lying on your back to sitting on the side of a flat bed without using bedrails?: A Little Help needed moving to and from a bed to a chair (including a wheelchair)?: A Little Help needed standing up from a chair using your arms (e.g., wheelchair or bedside chair)?: A Little Help needed to walk in hospital room?: A Little Help needed climbing 3-5 steps with a railing? : Total 6 Click Score: 17    End of Session Equipment Utilized During Treatment: Gait belt (ampushield) Activity Tolerance: Treatment limited secondary to medical complications (Comment) (+ dizziness) Patient left: with call bell/phone within reach;with family/visitor present;in chair;with chair alarm  set Nurse Communication: Mobility status (near fall but saved from fall by close chair follow/ very weak) PT Visit Diagnosis: Unsteadiness on feet (R26.81);Muscle weakness (generalized) (M62.81);Difficulty in walking, not elsewhere classified (R26.2);Pain Pain - Right/Left: Right Pain - part of body: Leg     Time: 0951-1030 PT Time Calculation (min) (ACUTE ONLY): 39 min  Charges:  $Gait Training: 8-22 mins $Therapeutic Exercise: 8-22 mins $Therapeutic Activity: 8-22 mins                     Bonney Leitz , PTA Acute Rehabilitation Services Office [phone]   Florestine Avers 01/20/2023, 10:31 AM

## 2023-01-20 NOTE — TOC Initial Note (Signed)
Transition of Care [facility]) - Initial/Assessment Note    Patient Details  Name: Kyle [name] MRN: [id number] Date of Birth: 03-15-1961  [facility] CM/SW Contact:    Epifanio Lesches, RN Phone Number: 01/20/2023, 11:00 AM  Clinical Narrative:              - s/p R BKA 6/19 From home with wife. Pt states has good support, wife, son, mom and friend. Declines PT recommendation for CIR. States not interested in any acute rehab. Placement. Agreeable to home health services. Pt without provider preference. Referral made with [facility], acceptance pending. Will need home health orders (PT/OT) / face to face to face from MD. States already has W/C, RW, BSC and handicap ramp. Family  to provide transportation to home once d/c ready. Pt without RX med concerns.  TOC team following for needs....  Expected Discharge Plan: Home w Home Health Services Barriers to Discharge: Continued Medical Work up   Patient Goals and CMS Choice     Choice offered to / list presented to : Patient      Expected Discharge Plan and Services   Discharge Planning Services: CM Consult   Living arrangements for the past 2 months: Single Family Home                                      Prior Living Arrangements/Services Living arrangements for the past 2 months: Single Family Home Lives with:: Spouse Patient language and need for interpreter reviewed:: Yes Do you feel safe going back to the place where you live?: Yes      Need for Family Participation in Patient Care: Yes (Comment) Care giver support system in place?: Yes (comment) Current home services: DME (knee scooter) Criminal Activity/Legal Involvement Pertinent to Current Situation/Hospitalization: No - Comment as needed  Activities of Daily Living Home Assistive Devices/Equipment: Eyeglasses, CBG Meter, Cane (specify quad or straight) (Knee Scooter) ADL Screening (condition at time of admission) Patient's  cognitive ability adequate to safely complete daily activities?: Yes Is the patient deaf or have difficulty hearing?: No Does the patient have difficulty seeing, even when wearing glasses/contacts?: No Does the patient have difficulty concentrating, remembering, or making decisions?: No Patient able to express need for assistance with ADLs?: Yes Does the patient have difficulty dressing or bathing?: No Independently performs ADLs?: Yes (appropriate for developmental age) Does the patient have difficulty walking or climbing stairs?: No Weakness of Legs: Right Weakness of Arms/Hands: None  Permission Sought/Granted   Permission granted to share information with : Yes, Verbal Permission Granted  Share Information with NAME: Harjit Leider ( spouse) [phone]           Emotional Assessment Appearance:: Appears stated age Attitude/Demeanor/Rapport: Engaged Affect (typically observed): Accepting Orientation: : Oriented to Self, Oriented to Place, Oriented to  Time, Oriented to Situation Alcohol / Substance Use: Not Applicable Psych Involvement: No (comment)  Admission diagnosis:  Below-knee amputation of right lower extremity (HCC) [P32.951O] Patient Active Problem List   Diagnosis Date Noted   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   Dehiscence of amputation stump of right lower extremity (HCC) 12/20/2022   Gangrene of toe of right foot (HCC) 11/11/2022   AKI (acute kidney injury) (HCC) 11/11/2022   Status post partial colectomy 10/04/2022   Type 2 diabetes mellitus with hyperglycemia, with long-term current use of insulin (HCC) 04/03/2022  Type 2 diabetes mellitus with diabetic polyneuropathy, with long-term current use of insulin (HCC) 04/03/2022   Diabetes mellitus (HCC) 04/03/2022   Colonic mass 03/04/2022   Skin ulcer of right great toe (HCC) 01/07/2022   Primary insomnia 12/19/2021   Noncompliance 05/30/2021   Back pain of lumbar region with  sciatica 01/31/2021   Epididymoorchitis 12/31/2020   Anemia of unknown etiology    Arthritis    Coronary artery disease    Degenerative disc disease, lumbar    Depression    Diabetes mellitus without complication (HCC)    GERD (gastroesophageal reflux disease)    Family history of adverse reaction to anesthesia    Hyperlipemia    Myocardial infarction (HCC)    Elevated alkaline phosphatase level 11/27/2020   Iron deficiency anemia due to chronic blood loss 11/07/2020   Vitamin B12 deficiency 11/02/2020   Anxiety disorder 11/01/2020   Benign prostatic hyperplasia with urinary frequency 11/01/2020   Coronary artery disease of native artery of native heart with stable angina pectoris (HCC) 11/01/2020   Mild intermittent asthma without complication 11/01/2020   Moderate episode of recurrent major depressive disorder (HCC) 11/01/2020   Psoriasis 11/01/2020   Uncomplicated opioid dependence (HCC) 11/01/2020   Primary osteoarthritis involving multiple joints 11/01/2020   Degenerative lumbar disc 11/01/2020   Mixed hyperlipidemia 06/28/2020   Malaise and fatigue 06/28/2020   Pneumonia 08/2016   History of arthroplasty of right knee 10/20/2015   Sepsis, unspecified organism (HCC) 10/20/2015   Primary osteoarthritis of right knee 09/30/2015   Morbid obesity (HCC) 08/23/2015   Essential (primary) hypertension 08/23/2015   Degeneration of intervertebral disc of lumbosacral region 08/23/2015   Generalized abdominal pain 08/23/2015   Class 2 severe obesity due to excess calories with serious comorbidity and body mass index (BMI) of 38.0 to 38.9 in adult (HCC) 08/23/2015   Type 2 diabetes mellitus without complications (HCC) 08/23/2015   Pain in lower limb 12/07/2013   Plantar fasciitis of left foot 11/01/2013   Metatarsal deformity 11/01/2013   Diabetes (HCC) 11/01/2013   Type 2 diabetes mellitus without complication, with long-term current use of insulin (HCC) 11/01/2013   DEHYDRATION  01/26/2009   DYSTHYMIC DISORDER 01/26/2009   ATHEROSLERO NATV ART EXTREM W/INTERMIT CLAUDICAT 01/26/2009   CLAUDICATION 01/26/2009   DYSPNEA 01/26/2009   CHEST PAIN UNSPECIFIED 01/26/2009   PCP:  [name], MD Pharmacy:   [facility] [phone] Rosalita Levan, [place] - [address] AT [facility] [address] [place] [address] Phone: [phone] Fax: [phone]     Social Determinants of Health (SDOH) Social History: SDOH Screenings   Food Insecurity: No Food Insecurity (01/15/2023)  Housing: Low Risk  (01/15/2023)  Transportation Needs: No Transportation Needs (01/15/2023)  Utilities: Not At Risk (01/15/2023)  Tobacco Use: Low Risk  (01/16/2023)   SDOH Interventions:     Readmission Risk Interventions     No data to display

## 2023-01-20 NOTE — TOC Progression Note (Signed)
Transition of Care [facility]) - Progression Note    Patient Details  Name: Kyle [name] MRN: [id number] Date of Birth: [birth year]  Transition of Care [facility]) CM/SW Contact  Kyle Lesches, RN Phone Number: 01/20/2023, 1:00 PM  Clinical Narrative:    Kyle [name] unable to accept pt for home health services 2/2 staffing. Referral made with [facility] and accepted, [facility] Thursday, MD made aware.  TOC team will continue to monitor for needs...  Expected Discharge Plan: Home w Home Health Services Barriers to Discharge: Continued Medical Work up  Expected Discharge Plan and Services   Discharge Planning Services: CM Consult   Living arrangements for the past 2 months: Single Family Home                           HH Arranged: PT, OT [facility] Agency: [facility] Date [facility] Agency Contacted: 01/20/23 Time HH Agency Contacted: 1300 Representative spoke with at [facility] Agency: Kandee Keen   Social Determinants of Health (SDOH) Interventions SDOH Screenings   Food Insecurity: No Food Insecurity (01/15/2023)  Housing: Low Risk  (01/15/2023)  Transportation Needs: No Transportation Needs (01/15/2023)  Utilities: Not At Risk (01/15/2023)  Tobacco Use: Low Risk  (01/16/2023)    Readmission Risk Interventions     No data to display

## 2023-01-20 NOTE — Inpatient Diabetes Management (Signed)
Inpatient Diabetes Program Recommendations  AACE/ADA: New Consensus Statement on Inpatient Glycemic Control (2015)  Target Ranges:  Prepandial:   less than 140 mg/dL      Peak postprandial:   less than 180 mg/dL (1-2 hours)      Critically ill patients:  140 - 180 mg/dL   Lab Results  Component Value Date   GLUCAP 119 (H) 01/20/2023   HGBA1C 7.0 (H) 11/12/2022    Review of Glycemic Control  Latest Reference Range & Units 01/18/23 21:23 01/18/23 22:30 01/19/23 07:37 01/19/23 11:39 01/19/23 17:05 01/19/23 20:48 01/19/23 22:00 01/20/23 07:28  Glucose-Capillary 70 - 99 mg/dL 60 (L) 161 (H) 096 (H) 100 (H) 79 50 (L) 178 (H) 119 (H)  (L): Data is abnormally low (H): Data is abnormally high  Diabetes history: DM2  Outpatient Diabetes medications:  Toujeo 550units at bedtime Humalog 12 units TID Glipizide 10 mg BID Metformin 1000 mg BID  Current orders for Inpatient glycemic control:  Semglee 30 units every day Novolog 0-15 units TID and 0-5 units at bedtime Novolog 4 units TID Metformin 1000 mg BID  Inpatient Diabetes Program Recommendations:    Referral for hypoglycemia at night.  Please consider:  Discontinue Glipizide while inpatient.    Will continue to follow while inpatient.  Thank you, Dulce Sellar, MSN, CDCES Diabetes Coordinator Inpatient Diabetes Program [phone] (team pager from 8a-5p)

## 2023-01-20 NOTE — Progress Notes (Signed)
Patient ID: Kyle [name], male   DOB: [birth year], 62 y.o.   MRN: [id number] Patient is status post right below-knee amputation.  He is making slow progress with therapy.  Anticipate discharge on Tuesday.

## 2023-01-21 ENCOUNTER — Encounter: Payer: Medicare Other | Admitting: Orthopedic Surgery

## 2023-01-21 LAB — GLUCOSE, CAPILLARY
Glucose-Capillary: 127 mg/dL — ABNORMAL HIGH (ref 70–99)
Glucose-Capillary: 142 mg/dL — ABNORMAL HIGH (ref 70–99)
Glucose-Capillary: 139 mg/dL — ABNORMAL HIGH (ref 70–99)
Glucose-Capillary: 139 mg/dL — ABNORMAL HIGH (ref 70–99)

## 2023-01-21 NOTE — Progress Notes (Signed)
Orthopedic Tech Progress Note Patient Details:  Kyle [name] 1960-12-25 [id number]  Called in order to [facility] for an AMPUSHIELD BK   Patient ID: Kyle [name], male   DOB: Jan 12, 1961, 62 y.o.   MRN: [id number]  [name] 01/21/2023, 9:12 AM

## 2023-01-21 NOTE — Care Management Important Message (Signed)
Important Message  Patient Details  Name: Nero Sawatzky MRN: [id number] Date of Birth: 10/21/1960   Medicare Important Message Given:  Yes     [name] 01/21/2023, 2:07 PM

## 2023-01-21 NOTE — Progress Notes (Signed)
Patient ID: Carole Deere, male   DOB: Mar 01, 1961, 62 y.o.   MRN: [id number] Patient is still slow to progress with therapy.  Patient sat down while trying to ambulate without warning yesterday.  Patient has had orthostatic hypotension.  Discussed with the patient this morning it would not be safe for him to go home.  Discussed that if he went home he would need 2 plus assistance to get into the house and for activities of daily living at home.  Order replaced for skilled nursing placement.

## 2023-01-21 NOTE — Plan of Care (Signed)
  Problem: Education: Goal: Ability to describe self-care measures that may prevent or decrease complications (Diabetes Survival Skills Education) will improve Outcome: Progressing Goal: Individualized Educational Video(s) Outcome: Progressing   Problem: Coping: Goal: Ability to adjust to condition or change in health will improve Outcome: Progressing   Problem: Fluid Volume: Goal: Ability to maintain a balanced intake and output will improve Outcome: Progressing   Problem: Health Behavior/Discharge Planning: Goal: Ability to identify and utilize available resources and services will improve Outcome: Progressing Goal: Ability to manage health-related needs will improve Outcome: Progressing   Problem: Metabolic: Goal: Ability to maintain appropriate glucose levels will improve Outcome: Progressing   Problem: Nutritional: Goal: Maintenance of adequate nutrition will improve Outcome: Progressing Goal: Progress toward achieving an optimal weight will improve Outcome: Progressing   Problem: Skin Integrity: Goal: Risk for impaired skin integrity will decrease Outcome: Progressing   Problem: Tissue Perfusion: Goal: Adequacy of tissue perfusion will improve Outcome: Progressing   Problem: Education: Goal: Knowledge of the prescribed therapeutic regimen will improve Outcome: Progressing Goal: Ability to verbalize activity precautions or restrictions will improve Outcome: Progressing Goal: Understanding of discharge needs will improve Outcome: Progressing   Problem: Activity: Goal: Ability to perform//tolerate increased activity and mobilize with assistive devices will improve Outcome: Progressing   Problem: Clinical Measurements: Goal: Postoperative complications will be avoided or minimized Outcome: Progressing   Problem: Self-Care: Goal: Ability to meet self-care needs will improve Outcome: Progressing   Problem: Self-Concept: Goal: Ability to maintain and perform  role responsibilities to the fullest extent possible will improve Outcome: Progressing   Problem: Pain Management: Goal: Pain level will decrease with appropriate interventions Outcome: Progressing   Problem: Skin Integrity: Goal: Demonstration of wound healing without infection will improve Outcome: Progressing   Problem: Education: Goal: Knowledge of General Education information will improve Description: Including pain rating scale, medication(s)/side effects and non-pharmacologic comfort measures Outcome: Progressing   Problem: Health Behavior/Discharge Planning: Goal: Ability to manage health-related needs will improve Outcome: Progressing   Problem: Clinical Measurements: Goal: Ability to maintain clinical measurements within normal limits will improve Outcome: Progressing Goal: Will remain free from infection Outcome: Progressing Goal: Diagnostic test results will improve Outcome: Progressing Goal: Respiratory complications will improve Outcome: Progressing Goal: Cardiovascular complication will be avoided Outcome: Progressing   Problem: Activity: Goal: Risk for activity intolerance will decrease Outcome: Progressing   Problem: Nutrition: Goal: Adequate nutrition will be maintained Outcome: Progressing   Problem: Coping: Goal: Level of anxiety will decrease Outcome: Progressing   Problem: Elimination: Goal: Will not experience complications related to bowel motility Outcome: Progressing Goal: Will not experience complications related to urinary retention Outcome: Progressing   Problem: Pain Managment: Goal: General experience of comfort will improve Outcome: Progressing   Problem: Safety: Goal: Ability to remain free from injury will improve Outcome: Progressing   Problem: Skin Integrity: Goal: Risk for impaired skin integrity will decrease Outcome: Progressing   

## 2023-01-21 NOTE — Progress Notes (Signed)
Physical Therapy Treatment Patient Details Name: Kyle [name] MRN: [id number] DOB: Aug 17, 1960 Today's Date: 01/21/2023   History of Present Illness 62 year old gentleman Admitted 6/19 and underwent Rt BKA. PMH: coronary artery disease, insulin-dependent type 2 diabetes, hypertension, hyperlipidemia, anemia, psoriasis, chronic right foot ulcer.    PT Comments    Pt supine in bed this session.  He reports he is going home and that he has the necessary support to return home.  He continues to buckle in L knee but was able to increase activity with close chair follow and shoe donned on L foot.  Pt remains an excellent candidate for agreessive rehab in a post acute setting.  If he returns home he is a high risk for a fall and re-admission.    Recommendations for follow up therapy are one component of a multi-disciplinary discharge planning process, led by the attending physician.  Recommendations may be updated based on patient status, additional functional criteria and insurance authorization.  Follow Up Recommendations       Assistance Recommended at Discharge Intermittent Supervision/Assistance  Patient can return home with the following A little help with walking and/or transfers;A little help with bathing/dressing/bathroom;Assistance with cooking/housework;Assist for transportation;Help with stairs or ramp for entrance   Equipment Recommendations  Rolling walker (2 wheels);Wheelchair (measurements PT);Wheelchair cushion (measurements PT) (stated he has a WC at home unsure of condition.)    Recommendations for Other Services       Precautions / Restrictions Precautions Precautions: Fall;Knee Precaution Comments: Reviewed amputee precautions Required Braces or Orthoses: Other Brace Other Brace: ampushield Restrictions Weight Bearing Restrictions: Yes RLE Weight Bearing: Non weight bearing     Mobility  Bed Mobility Overal bed mobility: Needs Assistance Bed Mobility: Supine to  Sit, Sit to Supine     Supine to sit: Supervision     General bed mobility comments: Pt able to move into sitting with decreased assistance this session.  He required rocking momentum to achieve.    Transfers Overall transfer level: Needs assistance Equipment used: Rolling walker (2 wheels) Transfers: Sit to/from Stand Sit to Stand: Mod assist, +2 safety/equipment Stand pivot transfers: Mod assist, From elevated surface         General transfer comment: Mod assistance to rise into standing, performed stand pivot and presented with L knee buckling when backing to recliner.  Pt performed additional trials to stand requiring mod assistance from standard seat height.  Cues for reaching back for recliner and continues to sit impulsively with poor eccentric load.    Ambulation/Gait Ambulation/Gait assistance: Min assist, Mod assist, +2 safety/equipment Gait Distance (Feet): 10 Feet (x3 trials.) Assistive device: Rolling walker (2 wheels) Gait Pattern/deviations: Step-to pattern, Trunk flexed, Leaning posteriorly       General Gait Details: Pt performed forward hop to pattern this session.  Required close chair follow through out as he continues to fatigue easily with short bouts of gt training.  L knee noted to buckle x 3 with cues for knee extension in stance phase during hop to pattern.   Stairs             Wheelchair Mobility    Modified Rankin (Stroke Patients Only)       Balance Overall balance assessment: Needs assistance Sitting-balance support: No upper extremity supported, Feet supported Sitting balance-Leahy Scale: Good     Standing balance support: Bilateral upper extremity supported Standing balance-Leahy Scale: Poor  Cognition Arousal/Alertness: Awake/alert Behavior During Therapy: WFL for tasks assessed/performed Overall Cognitive Status: Impaired/Different from baseline Area of Impairment: Problem solving                          Safety/Judgement: Decreased awareness of safety, Decreased awareness of deficits   Problem Solving: Requires verbal cues, Requires tactile cues General Comments: Pt reports he still feels safe to return home despite weakness and increased assistance to mobilize.  He had another episode of sitting impulsivly but chair was close this time.        Exercises      General Comments        Pertinent Vitals/Pain Pain Assessment Pain Assessment: Faces Faces Pain Scale: Hurts little more Pain Location: R BKA, and back ( chronic ) Pain Descriptors / Indicators: Aching, Discomfort, Sore Pain Intervention(s): Monitored during session, Repositioned    Home Living                          Prior Function            PT Goals (current goals can now be found in the care plan section) Acute Rehab PT Goals Patient Stated Goal: Go home Potential to Achieve Goals: Fair Progress towards PT goals: Progressing toward goals    Frequency    Min 4X/week      PT Plan Current plan remains appropriate    Co-evaluation              AM-PAC PT "6 Clicks" Mobility   Outcome Measure  Help needed turning from your back to your side while in a flat bed without using bedrails?: None Help needed moving from lying on your back to sitting on the side of a flat bed without using bedrails?: A Little Help needed moving to and from a bed to a chair (including a wheelchair)?: A Little Help needed standing up from a chair using your arms (e.g., wheelchair or bedside chair)?: A Little Help needed to walk in hospital room?: A Little Help needed climbing 3-5 steps with a railing? : Total 6 Click Score: 17    End of Session Equipment Utilized During Treatment: Gait belt Activity Tolerance: Patient tolerated treatment well;Patient limited by fatigue Patient left: with call bell/phone within reach;with family/visitor present;in chair;with chair alarm  set Nurse Communication: Mobility status PT Visit Diagnosis: Unsteadiness on feet (R26.81);Muscle weakness (generalized) (M62.81);Difficulty in walking, not elsewhere classified (R26.2);Pain Pain - Right/Left: Right Pain - part of body: Leg     Time: 3474-2595 PT Time Calculation (min) (ACUTE ONLY): 24 min  Charges:  $Gait Training: 8-22 mins $Therapeutic Activity: 8-22 mins                     Bonney Leitz , PTA Acute Rehabilitation Services Office [phone]    Florestine Avers 01/21/2023, 4:24 PM

## 2023-01-22 LAB — GLUCOSE, CAPILLARY
Glucose-Capillary: 122 mg/dL — ABNORMAL HIGH (ref 70–99)
Glucose-Capillary: 157 mg/dL — ABNORMAL HIGH (ref 70–99)
Glucose-Capillary: 134 mg/dL — ABNORMAL HIGH (ref 70–99)
Glucose-Capillary: 111 mg/dL — ABNORMAL HIGH (ref 70–99)

## 2023-01-22 NOTE — Progress Notes (Signed)
Patient ID: Kyle [name], male   DOB: 08-28-1960, 62 y.o.   MRN: [id number] Patient without complaints this morning.  Again discussed the importance of discharge to skilled nursing to minimize risk of patient falling at home sustaining injury at home.  Recommended short-term skilled nursing discharge for patient to build up his strength.

## 2023-01-22 NOTE — TOC Progression Note (Signed)
Transition of Care [facility]) - Progression Note    Patient Details  Name: Kyle [name] MRN: [id number] Date of Birth: [birth year]  Transition [facility]) CM/SW Contact  Lorri Frederick, LCSW Phone Number: 01/22/2023, 9:38 AM  Clinical Narrative:   CSW spoke with pt regarding PT and MD recommendation for SNF.  Pt stating that he does not want to pursue SNF, wants to DC home.  Pt reports he has support of wife and friend at home and confirms that his decision is to DC home.      Expected Discharge Plan: Home w Home Health Services Barriers to Discharge: Continued Medical Work up  Expected Discharge Plan and Services   Discharge Planning Services: CM Consult   Living arrangements for the past 2 months: Single Family Home                           HH Arranged: PT, OT [facility] Agency: [facility] Date [facility] Agency Contacted: 01/20/23 Time HH Agency Contacted: 1300 Representative spoke with at [facility] Agency: Kandee Keen   Social Determinants of Health (SDOH) Interventions SDOH Screenings   Food Insecurity: No Food Insecurity (01/15/2023)  Housing: Low Risk  (01/15/2023)  Transportation Needs: No Transportation Needs (01/15/2023)  Utilities: Not At Risk (01/15/2023)  Tobacco Use: Low Risk  (01/16/2023)    Readmission Risk Interventions     No data to display

## 2023-01-22 NOTE — Progress Notes (Signed)
PT Cancellation Note  Patient Details Name: Kyle [name] MRN: [id number] DOB: 06-28-1961   Cancelled Treatment:    Reason Eval/Treat Not Completed: (P) Pain limiting ability to participate (Pt reports he is in intolerable pain.  Surrounded by family at bedside.  He refused PT and reports he will do tomorrow am.  Pt continues to state he will not go to rehab.) Asked patient if he had support at home and family silent at bedside when asked if they feel confident with him returning home.  Provided education on how much assistance he is requiring but he still states he will d/c home.     Florestine Avers 01/22/2023, 4:32 PM

## 2023-01-22 NOTE — Progress Notes (Signed)
OT Cancellation Note  Patient Details Name: Kyle [name] MRN: [id number] DOB: 1961/07/06   Cancelled Treatment:    Reason Eval/Treat Not Completed: Pain limiting ability to participate (Pt reporting high levels of pain, denying therapy/mobility today. OT to follow-up with patient as able)\  01/22/2023  AB, OTR/L  Acute Rehabilitation Services  Office: [phone]   Tristan Schroeder 01/22/2023, 6:42 PM

## 2023-01-23 LAB — GLUCOSE, CAPILLARY
Glucose-Capillary: 168 mg/dL — ABNORMAL HIGH (ref 70–99)
Glucose-Capillary: 164 mg/dL — ABNORMAL HIGH (ref 70–99)

## 2023-01-23 MED ORDER — OXYCODONE-ACETAMINOPHEN 10-325 MG PO TABS: 1.0000 | ORAL_TABLET | ORAL | 0 refills | Status: DC | PRN

## 2023-01-23 NOTE — Progress Notes (Signed)
Patient ID: Kyle [name], male   DOB: 1960/09/12, 62 y.o.   MRN: [id number] Patient is status post transtibial amputation.  There is no drainage in the wound VAC canister.  Patient did not participate with physical therapy or occupational therapy yesterday.  Patient is still resistant for discharge to skilled nursing he states he wants to go home.  He states he has family at home that can help him.  I discussed that discharge to home is AGAINST MEDICAL ADVICE.  Patient states he understands and wishes to discharge today after therapy.  Discharge orders written for discharge to home.  Orders written to discontinue the wound VAC and apply dry dressing.

## 2023-01-23 NOTE — Progress Notes (Signed)
    Durable Medical Equipment  (From admission, onward)           Start     Ordered   01/23/23 1126  For home use only DME lightweight manual wheelchair with seat cushion  Once       Comments: Patient suffers from s/p R BKA which impairs their ability to perform daily activities like bathing in the home.  A walker will not resolve  issue with performing activities of daily living. A wheelchair will allow patient to safely perform daily activities. Patient is not able to propel themselves in the home using a standard weight wheelchair due to general weakness. Patient can self propel in the lightweight wheelchair. Length of need Lifetime. Accessories: elevating leg rests (ELRs), wheel locks, extensions and anti-tippers.   01/23/23 1126   01/23/23 1125  For home use only DME Bedside commode  Once       Comments: Confined to one room  Question:  Patient needs a bedside commode to treat with the following condition  Answer:  S/P BKA (below knee amputation), right (HCC)   01/23/23 1126   01/23/23 1123  For home use only DME Walker rolling  Once       Question Answer Comment  Walker: With 5 Inch Wheels   Patient needs a walker to treat with the following condition S/P BKA (below knee amputation), right (HCC)      01/23/23 1126

## 2023-01-23 NOTE — Progress Notes (Signed)
Physical Therapy Treatment Patient Details Name: Kyle [name] MRN: [id number] DOB: [birth year] Today's Date: 01/23/2023   History of Present Illness 62 year old gentleman Admitted 6/19 and underwent Rt BKA. PMH: coronary artery disease, insulin-dependent type 2 diabetes, hypertension, hyperlipidemia, anemia, psoriasis, chronic right foot ulcer.    PT Comments    Pt was seen for mobility on RW with help to stand, to sidestep and to transfer to [facility].  Pt is weak but can generate some force to get on his feet with gait belt and walker.  Pt is in attendance of wife who asks about the assist needed to help pt.  Pt declines to try a sliding transfer to a chair, but is willing to pivot on LLE with help.  Follow acutely for mobility on walker and to mult surfaces as his stay permits, recommending him to rehab but pt is choosing to go home.   Recommendations for follow up therapy are one component of a multi-disciplinary discharge planning process, led by the attending physician.  Recommendations may be updated based on patient status, additional functional criteria and insurance authorization.  Follow Up Recommendations       Assistance Recommended at Discharge Intermittent Supervision/Assistance  Patient can return home with the following A little help with walking and/or transfers;A little help with bathing/dressing/bathroom;Assistance with cooking/housework;Assist for transportation;Help with stairs or ramp for entrance   Equipment Recommendations  Rolling walker (2 wheels);Wheelchair (measurements PT);Wheelchair cushion (measurements PT)    Recommendations for Other Services       Precautions / Restrictions Precautions Precautions: Fall;Knee Precaution Comments: Reviewed amputee precautions Required Braces or Orthoses: Other Brace Other Brace: ampushield Restrictions Weight Bearing Restrictions: Yes RLE Weight Bearing: Non weight bearing     Mobility  Bed Mobility Overal bed mobility:  Needs Assistance Bed Mobility: Supine to Sit, Sit to Supine     Supine to sit: Min guard Sit to supine: Min guard   General bed mobility comments: min guard but pt is using bed rail    Transfers Overall transfer level: Needs assistance Equipment used: Rolling walker (2 wheels) Transfers: Sit to/from Stand Sit to Stand: Min assist, Mod assist Stand pivot transfers: Mod assist         General transfer comment: Min to mod assist to stand and get to  [facility] or chairwith help to steady his balance x with BSC armrests during cleaning up'    Ambulation/Gait Ambulation/Gait assistance: Mod assist Gait Distance (Feet): 3 Feet Assistive device: Rolling walker (2 wheels)   Gait velocity: reduced Gait velocity interpretation: <1.31 ft/sec, indicative of household ambulator Pre-gait activities: standing balance ck General Gait Details: hopping and sliding LLE to get to destination   Stairs             Wheelchair Mobility    Modified Rankin (Stroke Patients Only)       Balance Overall balance assessment: Needs assistance Sitting-balance support: Feet supported Sitting balance-Leahy Scale: Good     Standing balance support: Bilateral upper extremity supported Standing balance-Leahy Scale: Poor Standing balance comment: assist to balance and control to [facility] and back                            Cognition Arousal/Alertness: Awake/alert Behavior During Therapy: WFL for tasks assessed/performed Overall Cognitive Status: Difficult to assess Area of Impairment: Problem solving, Safety/judgement  Safety/Judgement: Decreased awareness of safety   Problem Solving: Requires verbal cues, Requires tactile cues, Slow processing General Comments: pt's wife asking if he needs physical help to stand, assured her he did        Exercises      General Comments General comments (skin integrity, edema, etc.): Pt was seen for practices of  STS and sidesteps, then to [facility] and back to bed.  Pt is only stable to stand without help to use BSC arms after using commode.      Pertinent Vitals/Pain Pain Assessment Pain Assessment: Faces Faces Pain Scale: Hurts little more Pain Location: R BKA Pain Descriptors / Indicators: Guarding, Aching    Home Living                          Prior Function            PT Goals (current goals can now be found in the care plan section) Acute Rehab PT Goals Patient Stated Goal: Go home Progress towards PT goals: Progressing toward goals    Frequency    Min 4X/week      PT Plan Current plan remains appropriate    Co-evaluation              AM-PAC PT "6 Clicks" Mobility   Outcome Measure  Help needed turning from your back to your side while in a flat bed without using bedrails?: None Help needed moving from lying on your back to sitting on the side of a flat bed without using bedrails?: A Little Help needed moving to and from a bed to a chair (including a wheelchair)?: A Little Help needed standing up from a chair using your arms (e.g., wheelchair or bedside chair)?: A Lot Help needed to walk in hospital room?: A Lot Help needed climbing 3-5 steps with a railing? : Total 6 Click Score: 15    End of Session Equipment Utilized During Treatment: Gait belt Activity Tolerance: Patient tolerated treatment well;Patient limited by fatigue Patient left: with call bell/phone within reach;with family/visitor present;in chair;with chair alarm set Nurse Communication: Mobility status PT Visit Diagnosis: Unsteadiness on feet (R26.81);Muscle weakness (generalized) (M62.81);Difficulty in walking, not elsewhere classified (R26.2);Pain Pain - Right/Left: Right Pain - part of body: Leg     Time: 2130-8657 PT Time Calculation (min) (ACUTE ONLY): 34 min  Charges:  $Therapeutic Activity: 23-37 mins        Ivar Drape 01/23/2023, 4:21 PM  Samul Dada, PT PhD Acute Rehab  Dept. Number: [facility] [id number] and [facility] [phone]

## 2023-01-23 NOTE — Discharge Summary (Signed)
Discharge Diagnoses:  Principal Problem:   Below-knee amputation of right lower extremity (HCC)   Surgeries: Procedure(s): RIGHT BELOW KNEE AMPUTATION on 01/15/2023    Consultants:   Discharged Condition: Improved  Hospital Course: Kyle [name] is an 62 y.o. male who was admitted 01/15/2023 with a chief complaint of dehiscence right foot amputation, with a final diagnosis of Dehiscence Right Foot Amputation.  Patient was brought to the operating room on 01/15/2023 and underwent Procedure(s): RIGHT BELOW KNEE AMPUTATION.    Patient was given perioperative antibiotics:  Anti-infectives (From admission, onward)    Start     Dose/Rate Route Frequency Ordered Stop   01/15/23 1930  ceFAZolin (ANCEF) IVPB 2g/100 mL premix        2 g 200 mL/hr over 30 Minutes Intravenous Every 8 hours 01/15/23 1421 01/16/23 0435   01/15/23 1015  ceFAZolin (ANCEF) IVPB 2g/100 mL premix        2 g 200 mL/hr over 30 Minutes Intravenous On call to O.R. 01/15/23 1000 01/15/23 2011     .  Patient was given sequential compression devices, early ambulation, and aspirin for DVT prophylaxis.  Recent vital signs: Patient Vitals for the past 24 hrs:  BP Temp Temp src Pulse Resp SpO2  01/23/23 0723 120/70 98 F (36.7 C) -- 80 18 95 %  01/23/23 0436 (!) 144/86 97.8 F (36.6 C) Oral 83 14 --  01/22/23 2029 131/78 98.6 F (37 C) Oral 84 15 94 %  01/22/23 1256 121/82 98.2 F (36.8 C) Oral 83 18 93 %  01/22/23 0736 114/78 98.6 F (37 C) Oral 78 17 95 %  .  Recent laboratory studies: No results found.  Discharge Medications:   Allergies as of 01/23/2023       Reactions   Penicillins Hives, Swelling, Rash   Has patient had a PCN reaction causing immediate rash, facial/tongue/throat swelling, SOB or lightheadedness with hypotension: No Has patient had a PCN reaction causing severe rash involving mucus membranes or skin necrosis: No Has patient had a PCN reaction that required hospitalization No Has patient had  a PCN reaction occurring within the last 10 years: No If all of the above answers are "NO", then may proceed with Cephalosporin use. ANCEF GIVEN ON 12-20-22 WITHOUT ISSUE   Cymbalta [duloxetine Hcl] Other (See Comments)   "makes me crazy" depression        Medication List     TAKE these medications    albuterol 108 (90 Base) MCG/ACT inhaler Commonly known as: VENTOLIN HFA Inhale 2 puffs into the lungs every 6 (six) hours as needed for wheezing or shortness of breath.   aspirin 325 MG tablet Take 325 mg by mouth in the morning.   buPROPion 100 MG tablet Commonly known as: WELLBUTRIN Take 100 mg by mouth in the morning.   busPIRone 10 MG tablet Commonly known as: BUSPAR Take 10 mg by mouth in the morning.   Centrum Silver 50+Men Tabs Take 1 tablet by mouth in the morning.   clindamycin 150 MG capsule Commonly known as: CLEOCIN Take 1 capsule (150 mg total) by mouth 3 (three) times daily.   clonazePAM 1 MG tablet Commonly known as: KLONOPIN Take 1 mg by mouth at bedtime.   Dexcom G7 Sensor Misc 1 Device by Does not apply route as directed.   doxycycline 100 MG tablet Commonly known as: VIBRA-TABS Take 1 tablet (100 mg total) by mouth 2 (two) times daily.   ferrous sulfate 325 (65 FE) MG tablet Take  325 mg by mouth daily with breakfast.   gabapentin 300 MG capsule Commonly known as: NEURONTIN Take 1 capsule (300 mg total) by mouth 4 (four) times daily. What changed:  how much to take when to take this   glipiZIDE 10 MG tablet Commonly known as: GLUCOTROL Take 10 mg by mouth 2 (two) times daily before a meal.   HumaLOG KwikPen 100 UNIT/ML KwikPen Generic drug: insulin lispro Inject 12 Units into the skin 3 (three) times daily before meals.   HYDROcodone-acetaminophen 5-325 MG tablet Commonly known as: NORCO/VICODIN Take 1 tablet by mouth every 6 (six) hours as needed for moderate pain.   magnesium oxide 400 MG tablet Commonly known as: MAG-OX Take 400  mg by mouth in the morning.   metFORMIN 500 MG tablet Commonly known as: GLUCOPHAGE Take 2 tablets (1,000 mg total) by mouth 2 (two) times daily.   metoprolol succinate 25 MG 24 hr tablet Commonly known as: TOPROL-XL Take 25 mg by mouth in the morning.   nitroGLYCERIN 0.4 MG SL tablet Commonly known as: NITROSTAT Place 1 tablet (0.4 mg total) under the tongue every 5 (five) minutes as needed for chest pain.   omeprazole 40 MG capsule Commonly known as: PRILOSEC Take 40 mg by mouth in the morning.   oxyCODONE-acetaminophen 10-325 MG tablet Commonly known as: PERCOCET Take 1 tablet by mouth every 4 (four) hours as needed for pain. What changed: when to take this   PARoxetine 40 MG tablet Commonly known as: PAXIL Take 40 mg by mouth daily.   polyethylene glycol 17 g packet Commonly known as: MIRALAX / GLYCOLAX Take 17 g by mouth daily as needed for mild constipation.   ranolazine 500 MG 12 hr tablet Commonly known as: Ranexa Take 1 tablet (500 mg total) by mouth 2 (two) times daily.   rosuvastatin 10 MG tablet Commonly known as: CRESTOR Take 10 mg by mouth in the morning.   tamsulosin 0.4 MG Caps capsule Commonly known as: FLOMAX Take 0.4 mg by mouth in the morning and at bedtime.   Toujeo SoloStar 300 UNIT/ML Solostar Pen Generic drug: insulin glargine (1 Unit Dial) Inject 50 Units into the skin daily in the afternoon. What changed:  how much to take when to take this   triamcinolone cream 0.1 % Commonly known as: KENALOG Apply 1 application topically 2 (two) times daily as needed for rash.        Diagnostic Studies: No results found.  Patient benefited maximally from their hospital stay and there were no complications.     Disposition: Discharge disposition: 01-Home or Self Care      Discharge Instructions     Call MD / Call 911   Complete by: As directed    If you experience chest pain or shortness of breath, CALL 911 and be transported to the  hospital emergency room.  If you develope a fever above 101 F, pus (white drainage) or increased drainage or redness at the wound, or calf pain, call your surgeon's office.   Constipation Prevention   Complete by: As directed    Drink plenty of fluids.  Prune juice may be helpful.  You may use a stool softener, such as Colace (over the counter) 100 mg twice a day.  Use MiraLax (over the counter) for constipation as needed.   Diet - low sodium heart healthy   Complete by: As directed    Increase activity slowly as tolerated   Complete by: As directed  Post-operative opioid taper instructions:   Complete by: As directed    POST-OPERATIVE OPIOID TAPER INSTRUCTIONS: It is important to wean off of your opioid medication as soon as possible. If you do not need pain medication after your surgery it is ok to stop day one. Opioids include: Codeine, Hydrocodone(Norco, Vicodin), Oxycodone(Percocet, oxycontin) and hydromorphone amongst others.  Long term and even short term use of opiods can cause: Increased pain response Dependence Constipation Depression Respiratory depression And more.  Withdrawal symptoms can include Flu like symptoms Nausea, vomiting And more Techniques to manage these symptoms Hydrate well Eat regular healthy meals Stay active Use relaxation techniques(deep breathing, meditating, yoga) Do Not substitute Alcohol to help with tapering If you have been on opioids for less than two weeks and do not have pain than it is ok to stop all together.  Plan to wean off of opioids This plan should start within one week post op of your joint replacement. Maintain the same interval or time between taking each dose and first decrease the dose.  Cut the total daily intake of opioids by one tablet each day Next start to increase the time between doses. The last dose that should be eliminated is the evening dose.          Follow-up Information     Nadara Mustard, MD Follow up  in 1 week(s).   Specialty: Orthopedic Surgery Contact information: [address] [place] [address] [phone]                  Signed: Nadara Mustard 01/23/2023, 7:31 AM

## 2023-01-27 ENCOUNTER — Other Ambulatory Visit: Payer: Self-pay | Admitting: Podiatry

## 2023-01-27 ENCOUNTER — Telehealth: Payer: Self-pay | Admitting: Orthopedic Surgery

## 2023-01-27 NOTE — Telephone Encounter (Signed)
Pt called requesting Dr Lajoyce Corners change his pain medication from Oxycodone to Hydrocodone. Pt states Oxy always make him sick and informed Dr Lajoyce Corners before. Pt asking for meds to go to [facility] [address] [place]. Pt phone number is [phone].

## 2023-01-28 ENCOUNTER — Telehealth: Payer: Self-pay | Admitting: Orthopedic Surgery

## 2023-01-28 MED ORDER — HYDROCODONE-ACETAMINOPHEN 10-325 MG PO TABS: 1.0000 | ORAL_TABLET | ORAL | 0 refills | Status: DC | PRN

## 2023-01-28 MED ORDER — OXYCODONE HCL 5 MG PO CAPS: 5.0000 mg | ORAL_CAPSULE | ORAL | 0 refills | Status: DC | PRN

## 2023-01-28 NOTE — Addendum Note (Signed)
Addended by: Barnie Del R on: 01/28/2023 03:00 PM   Modules accepted: Orders

## 2023-01-28 NOTE — Telephone Encounter (Signed)
I have already sent this to [facility].   He does have an appt tomorrow morning to see Denny Peon for post op appt.

## 2023-01-28 NOTE — Telephone Encounter (Signed)
Mr. Kyle [name] asked that another message be taken about changing his pain medication Rx.  He called yesterday about switching to something else because he believes that he is allergic to hydrocodone.

## 2023-01-28 NOTE — Addendum Note (Signed)
Addended by: Adonis Huguenin on: 01/28/2023 08:53 AM   Modules accepted: Orders

## 2023-01-29 ENCOUNTER — Encounter: Payer: Self-pay | Admitting: Family

## 2023-01-29 ENCOUNTER — Telehealth: Payer: Self-pay | Admitting: Orthopedic Surgery

## 2023-01-29 ENCOUNTER — Ambulatory Visit (INDEPENDENT_AMBULATORY_CARE_PROVIDER_SITE_OTHER): Payer: Medicare Other | Admitting: Family

## 2023-01-29 DIAGNOSIS — S88111D Complete traumatic amputation at level between knee and ankle, right lower leg, subsequent encounter: Secondary | ICD-10-CM

## 2023-01-29 DIAGNOSIS — Z89511 Acquired absence of right leg below knee: Secondary | ICD-10-CM

## 2023-01-29 NOTE — Telephone Encounter (Signed)
Called and sw HH to advise verbal ok for orders below.

## 2023-01-29 NOTE — Telephone Encounter (Signed)
Greig Castilla from [facility] requesting extension of care for pt 1 week 4  CB # [phone] Secure to leave VM

## 2023-01-29 NOTE — Progress Notes (Signed)
Post-Op Visit Note   Patient: Kyle [name]           Date of Birth: 1961-04-04           MRN: [id number] Visit Date: 01/29/2023 PCP: [name], MD  Chief Complaint:  Chief Complaint  Patient presents with   Right Leg - Routine Post Op    01/15/2023 right BKA     HPI:  HPI The patient is a 62 year old gentleman who is seen status post right below-knee amputation June 19 of this year he continues to have pain he states he has bumped his residual limb on the nightstand he does not wear his limb protector in bed.  Requesting a prescription for more shrinker.    Patient is a new right transtibial  amputee.  Patient's current comorbidities are not expected to impact the ability to function with the prescribed prosthesis. Patient verbally communicates a strong desire to use a prosthesis. Patient currently requires mobility aids to ambulate without a prosthesis.  Expects not to use mobility aids with a new prosthesis.  Patient is a K2 level ambulator that will use a prosthesis to walk around their home and the community over low level environmental barriers.     Ortho Exam On examination right residual limb staples are in place he does have moderate edema there is no erythema or warmth incision healing well laterally and medially this has not completely healed centrally we will hold off on staple removal  Visit Diagnoses: No diagnosis found.  Plan: Continue daily dose of cleansing dry dressings shrinker for compression will continue limb protector plan to harvest staples at next visit given an order for his prosthesis set up today.  Follow-Up Instructions: Return in about 2 weeks (around 02/12/2023).   Imaging: No results found.  Orders:  No orders of the defined types were placed in this encounter.  No orders of the defined types were placed in this encounter.    PMFS History: Patient Active Problem List   Diagnosis Date Noted   Below-knee amputation of right lower  extremity (HCC) 01/15/2023   Dehiscence of amputation stump of right lower extremity (HCC) 12/20/2022   Gangrene of toe of right foot (HCC) 11/11/2022   AKI (acute kidney injury) (HCC) 11/11/2022   Status post partial colectomy 10/04/2022   Type 2 diabetes mellitus with hyperglycemia, with long-term current use of insulin (HCC) 04/03/2022   Type 2 diabetes mellitus with diabetic polyneuropathy, with long-term current use of insulin (HCC) 04/03/2022   Diabetes mellitus (HCC) 04/03/2022   Colonic mass 03/04/2022   Skin ulcer of right great toe (HCC) 01/07/2022   Primary insomnia 12/19/2021   Noncompliance 05/30/2021   Back pain of lumbar region with sciatica 01/31/2021   Epididymoorchitis 12/31/2020   Anemia of unknown etiology    Arthritis    Coronary artery disease    Degenerative disc disease, lumbar    Depression    Diabetes mellitus without complication (HCC)    GERD (gastroesophageal reflux disease)    Family history of adverse reaction to anesthesia    Hyperlipemia    Myocardial infarction (HCC)    Elevated alkaline phosphatase level 11/27/2020   Iron deficiency anemia due to chronic blood loss 11/07/2020   Vitamin B12 deficiency 11/02/2020   Anxiety disorder 11/01/2020   Benign prostatic hyperplasia with urinary frequency 11/01/2020   Coronary artery disease of native artery of native heart with stable angina pectoris (HCC) 11/01/2020   Mild intermittent asthma without complication 11/01/2020  Moderate episode of recurrent major depressive disorder (HCC) 11/01/2020   Psoriasis 11/01/2020   Uncomplicated opioid dependence (HCC) 11/01/2020   Primary osteoarthritis involving multiple joints 11/01/2020   Degenerative lumbar disc 11/01/2020   Mixed hyperlipidemia 06/28/2020   Malaise and fatigue 06/28/2020   Pneumonia 08/2016   History of arthroplasty of right knee 10/20/2015   Sepsis, unspecified organism (HCC) 10/20/2015   Primary osteoarthritis of right knee 09/30/2015    Morbid obesity (HCC) 08/23/2015   Essential (primary) hypertension 08/23/2015   Degeneration of intervertebral disc of lumbosacral region 08/23/2015   Generalized abdominal pain 08/23/2015   Class 2 severe obesity due to excess calories with serious comorbidity and body mass index (BMI) of 38.0 to 38.9 in adult [facility]) 08/23/2015   Type 2 diabetes mellitus without complications (HCC) 08/23/2015   Pain in lower limb 12/07/2013   Plantar fasciitis of left foot 11/01/2013   Metatarsal deformity 11/01/2013   Diabetes (HCC) 11/01/2013   Type 2 diabetes mellitus without complication, with long-term current use of insulin (HCC) 11/01/2013   DEHYDRATION 01/26/2009   DYSTHYMIC DISORDER 01/26/2009   ATHEROSLERO NATV ART EXTREM W/INTERMIT CLAUDICAT 01/26/2009   CLAUDICATION 01/26/2009   DYSPNEA 01/26/2009   CHEST PAIN UNSPECIFIED 01/26/2009   Past Medical History:  Diagnosis Date   AKI (acute kidney injury) (HCC) 11/11/2022   01/14/23,  numbers improved   Anemia of unknown etiology    Anxiety disorder    Arthritis    ATHEROSLERO NATV ART EXTREM W/INTERMIT CLAUDICAT 01/26/2009   Qualifier: Diagnosis of  By: Valinda Party RN, Nancy     Back pain of lumbar region with sciatica 01/31/2021   Benign prostatic hyperplasia with urinary frequency    CHEST PAIN UNSPECIFIED 01/26/2009   Qualifier: Diagnosis of  By: Valinda Party RN, Harriett Sine     Class 2 severe obesity due to excess calories with serious comorbidity and body mass index (BMI) of 38.0 to 38.9 in adult [facility])    CLAUDICATION 01/26/2009   Qualifier: Diagnosis of  By: Valinda Party RN, Nancy     Colonic mass 03/04/2022   Coronary artery disease    minimal CAD '12; NL stress test 06/10/14 ([facility])   Coronary artery disease of native artery of native heart with stable angina pectoris (HCC)    Degeneration of intervertebral disc of lumbosacral region 08/23/2015   Degenerative disc disease, lumbar    Degenerative lumbar disc 11/01/2020   Formatting of this note  might be different from the original. Was recommended back surgery. Holding out.   Dehydration 01/26/2009   Qualifier: Diagnosis of  By: Valinda Party RN, Nancy     Depression    Diabetes mellitus without complication (HCC)    DYSPNEA 01/26/2009   Qualifier: Diagnosis of  By: Madelin Rear     Dysthymic disorder 01/26/2009   Qualifier: Diagnosis of  By: Valinda Party RN, Nancy     Elevated alkaline phosphatase level 11/27/2020   Epididymoorchitis 12/31/2020   Essential (primary) hypertension 08/23/2015   Family history of adverse reaction to anesthesia    "it made my son sick"   Gangrene of toe of right foot (HCC) 11/11/2022   Generalized abdominal pain 08/23/2015   GERD (gastroesophageal reflux disease)    History of arthroplasty of right knee    Hyperlipemia    Iron deficiency anemia due to chronic blood loss 11/07/2020   Malaise and fatigue 06/28/2020   Metatarsal deformity 11/01/2013   Mild intermittent asthma without complication    Mixed hyperlipidemia    Moderate episode of  recurrent major depressive disorder (HCC)    Morbid obesity (HCC) 08/23/2015   Myocardial infarction (HCC)    2012; hospitalized [facility] for chest pain syndrome 02/2011 and LHC showed trivial CAD   Neuropathy    Noncompliance 05/30/2021   Pain in lower limb 12/07/2013   Plantar fasciitis of left foot 11/01/2013   Pneumonia 08/2016   surgery had to be rescheduled due to pneumonia   Primary insomnia 12/19/2021   Formatting of this note might be different from the original.  Avoid Rx   Primary osteoarthritis involving multiple joints 11/01/2020   Formatting of this note might be different from the original. both knees replaced.   Primary osteoarthritis of left knee 09/06/2016   Primary osteoarthritis of right knee 09/30/2015   Psoriasis    Sepsis, unspecified organism (HCC) 10/20/2015   Skin ulcer of right great toe (HCC) 01/07/2022   Status post partial colectomy 10/04/2022   Type 2 diabetes mellitus with  diabetic polyneuropathy, with long-term current use of insulin (HCC) 04/03/2022   Type 2 diabetes mellitus without complications (HCC) 08/23/2015   Uncomplicated opioid dependence (HCC)    Vitamin B12 deficiency 11/02/2020    Family History  Problem Relation Age of Onset   Cancer Mother    Diabetes Mother    Hyperlipidemia Father    Hypertension Father    Heart failure Father    Heart failure Brother     Past Surgical History:  Procedure Laterality Date   AMPUTATION Right 11/15/2022   Procedure: RIGHT TRANSMETATARSAL AMPUTATION;  Surgeon: Nadara Mustard, MD;  Location: [facility] OR;  Service: Orthopedics;  Laterality: Right;   AMPUTATION Right 12/20/2022   Procedure: RIGHT CHOPART AMPUTATION;  Surgeon: Nadara Mustard, MD;  Location: [facility] OR;  Service: Orthopedics;  Laterality: Right;   AMPUTATION Right 01/15/2023   Procedure: RIGHT BELOW KNEE AMPUTATION;  Surgeon: Nadara Mustard, MD;  Location: [facility] OR;  Service: Orthopedics;  Laterality: Right;   CARDIAC CATHETERIZATION     03/28/11 LHC: NL LM, LAD; 10% mCX, mRCA. EF 65%. (Dr. Dot Been, [facility])   CHOLECYSTECTOMY     CORONARY ANGIOPLASTY     2012 [facility]    ESOPHAGEAL DILATION     SEVERAL TIMES   Heel Spur Resection with Fasiotomy Left 11/25/2013   @ [facility]   KNEE ARTHROSCOPY Bilateral    SHOULDER ARTHROSCOPY WITH OPEN ROTATOR CUFF REPAIR Right    TOTAL KNEE ARTHROPLASTY Right 10/04/2015   Procedure: RIGHT TOTAL KNEE ARTHROPLASTY;  Surgeon: Gean Birchwood, MD;  Location: [facility] OR;  Service: Orthopedics;  Laterality: Right;   TOTAL KNEE ARTHROPLASTY Left 10/14/2016   TOTAL KNEE ARTHROPLASTY Left 10/14/2016   Procedure: LEFT TOTAL KNEE ARTHROPLASTY;  Surgeon: Gean Birchwood, MD;  Location: [facility] OR;  Service: Orthopedics;  Laterality: Left;   TUMOR REMOVAL     FATTY TUMOR  RT SHOULDER   Social History   Occupational History   Not on file  Tobacco Use   Smoking status: Never   Smokeless tobacco: Never  Vaping Use   Vaping Use: Never used   Substance and Sexual Activity   Alcohol use: No   Drug use: No   Sexual activity: Not on file

## 2023-02-03 ENCOUNTER — Telehealth: Payer: Self-pay | Admitting: Orthopedic Surgery

## 2023-02-03 ENCOUNTER — Other Ambulatory Visit: Payer: Self-pay | Admitting: Orthopedic Surgery

## 2023-02-03 MED ORDER — HYDROCODONE-ACETAMINOPHEN 5-325 MG PO TABS: 1.0000 | ORAL_TABLET | Freq: Four times a day (QID) | ORAL | 0 refills | Status: DC | PRN

## 2023-02-03 NOTE — Telephone Encounter (Signed)
Pt called in requesting Hydrocodone refill sent to [facility] on file please advise

## 2023-02-04 ENCOUNTER — Other Ambulatory Visit: Payer: Self-pay | Admitting: Orthopedic Surgery

## 2023-02-04 ENCOUNTER — Telehealth: Payer: Self-pay | Admitting: Orthopedic Surgery

## 2023-02-04 MED ORDER — HYDROCODONE-ACETAMINOPHEN 10-325 MG PO TABS: 1.0000 | ORAL_TABLET | Freq: Four times a day (QID) | ORAL | 0 refills | Status: DC | PRN

## 2023-02-04 NOTE — Telephone Encounter (Signed)
Pt called again about update for up-ing dosage of pain medication and if it can be sent in. Please call pt about this matter at [phone].

## 2023-02-04 NOTE — Telephone Encounter (Signed)
This is a duplicate. I am closing out this message. Message from earlier today has already been sent to Dr. Lajoyce Corners. He will address after clinic.

## 2023-02-04 NOTE — Telephone Encounter (Signed)
Patient called advised the Hydrocodone 5.325 is not working to help with the pain. Patient asked if he can get a Rx for the 10.325 because it helped to control the pain. Patient said he uses [facility] on [address] in [place], [place]   The number to contact patient is [phone]

## 2023-02-04 NOTE — Telephone Encounter (Signed)
Pt informed

## 2023-02-06 ENCOUNTER — Telehealth: Payer: Self-pay

## 2023-02-06 NOTE — Telephone Encounter (Signed)
PT with [facility] would like a call back concerning patient.  Stated that patient had fall.  Cb# [phone].  Please advise.  Thank you.

## 2023-02-07 NOTE — Telephone Encounter (Signed)
SW Rayan, PT, reports pt fell yesterday, slid off bed. No injury, no dehiscence to incision. Arm a little sore from catching himself off the bed.

## 2023-02-10 ENCOUNTER — Ambulatory Visit (INDEPENDENT_AMBULATORY_CARE_PROVIDER_SITE_OTHER): Payer: Medicare Other | Admitting: Orthopedic Surgery

## 2023-02-10 DIAGNOSIS — Z89511 Acquired absence of right leg below knee: Secondary | ICD-10-CM

## 2023-02-10 DIAGNOSIS — S88111D Complete traumatic amputation at level between knee and ankle, right lower leg, subsequent encounter: Secondary | ICD-10-CM

## 2023-02-10 MED ORDER — HYDROCODONE-ACETAMINOPHEN 10-325 MG PO TABS: 1.0000 | ORAL_TABLET | Freq: Four times a day (QID) | ORAL | 0 refills | Status: DC | PRN

## 2023-02-10 MED ORDER — DOXYCYCLINE HYCLATE 100 MG PO TABS: 100.0000 mg | ORAL_TABLET | Freq: Two times a day (BID) | ORAL | 0 refills | Status: DC

## 2023-02-11 ENCOUNTER — Encounter: Payer: Self-pay | Admitting: Orthopedic Surgery

## 2023-02-11 NOTE — Progress Notes (Signed)
Office Visit Note   Patient: Kyle [name]           Date of Birth: [birth year]           MRN: [id number] Visit Date: 02/10/2023              Requested by: [name], MD [address] [place],  [place] [address] PCP: [name], MD  Chief Complaint  Patient presents with   Right Leg - Routine Post Op    01/15/2023 right BKA      HPI: Patient is a 62 year old gentleman who is 4 weeks status post right below-knee amputation.  Patient presents with concern for infection.  Patient states the Vicodin has not helped and Percocet makes him nauseated.  Patient states that he recently fell on the end of the residual limb and slipped out of bed recently.  Assessment & Plan: Visit Diagnoses:  1. Below-knee amputation of right lower extremity, subsequent encounter (HCC)     Plan: Recommend use the compression stump shrinker to help with the swelling prescription provided for doxycycline and Vicodin 10 mg.  Remove staples at follow-up.  Follow-Up Instructions: Return in about 2 weeks (around 02/24/2023).   Ortho Exam  Patient is alert, oriented, no adenopathy, well-dressed, normal affect, normal respiratory effort. Examination there is an area of redness along the incision 1 cm wide there is no drainage no ascending cellulitis.  There is swelling.  Imaging: No results found. No images are attached to the encounter.  Labs: Lab Results  Component Value Date   HGBA1C 7.0 (H) 11/12/2022   HGBA1C 9.2 (H) 10/14/2016   HGBA1C 8.8 (H) 09/03/2016   ESRSEDRATE 79 (H) 11/11/2022   CRP 18.4 (H) 11/11/2022   REPTSTATUS 11/16/2022 FINAL 11/11/2022   CULT  11/11/2022    NO GROWTH 5 DAYS Performed at [facility], [address], [place], [place] [address]      Lab Results  Component Value Date   ALBUMIN 3.4 (L) 11/11/2022    Lab Results  Component Value Date   MG 1.8 11/15/2022   MG 1.6 (L) 11/14/2022   No results found for: "VD25OH"  No results found for:  "PREALBUMIN"    Latest Ref Rng & Units 01/15/2023   10:02 AM 11/13/2022    1:06 AM 11/12/2022    2:53 AM  CBC EXTENDED  WBC 4.0 - 10.5 K/uL 10.0  8.4  11.1   RBC 4.22 - 5.81 MIL/uL 3.84  3.72  3.39   Hemoglobin 13.0 - 17.0 g/dL 82.9  56.2  9.2   HCT 13.0 - 52.0 % 33.5  32.1  29.3   Platelets 150 - 400 K/uL 462  320  294   NEUT# 1.7 - 7.7 K/uL  4.7    Lymph# 0.7 - 4.0 K/uL  2.4       There is no height or weight on file to calculate BMI.  Orders:  No orders of the defined types were placed in this encounter.  Meds ordered this encounter  Medications   HYDROcodone-acetaminophen (NORCO) 10-325 MG tablet    Sig: Take 1 tablet by mouth every 6 (six) hours as needed.    Dispense:  30 tablet    Refill:  0   doxycycline (VIBRA-TABS) 100 MG tablet    Sig: Take 1 tablet (100 mg total) by mouth 2 (two) times daily.    Dispense:  30 tablet    Refill:  0     Procedures:  No procedures performed  Clinical Data: No additional findings.  ROS:  All other systems negative, except as noted in the HPI. Review of Systems  Objective: Vital Signs: There were no vitals taken for this visit.  Specialty Comments:  No specialty comments available.  PMFS History: Patient Active Problem List   Diagnosis Date Noted   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   Dehiscence of amputation stump of right lower extremity (HCC) 12/20/2022   Gangrene of toe of right foot (HCC) 11/11/2022   AKI (acute kidney injury) (HCC) 11/11/2022   Status post partial colectomy 10/04/2022   Type 2 diabetes mellitus with hyperglycemia, with long-term current use of insulin (HCC) 04/03/2022   Type 2 diabetes mellitus with diabetic polyneuropathy, with long-term current use of insulin (HCC) 04/03/2022   Diabetes mellitus (HCC) 04/03/2022   Colonic mass 03/04/2022   Skin ulcer of right great toe (HCC) 01/07/2022   Primary insomnia 12/19/2021   Noncompliance 05/30/2021   Back pain of lumbar  region with sciatica 01/31/2021   Epididymoorchitis 12/31/2020   Anemia of unknown etiology    Arthritis    Coronary artery disease    Degenerative disc disease, lumbar    Depression    Diabetes mellitus without complication (HCC)    GERD (gastroesophageal reflux disease)    Family history of adverse reaction to anesthesia    Hyperlipemia    Myocardial infarction (HCC)    Elevated alkaline phosphatase level 11/27/2020   Iron deficiency anemia due to chronic blood loss 11/07/2020   Vitamin B12 deficiency 11/02/2020   Anxiety disorder 11/01/2020   Benign prostatic hyperplasia with urinary frequency 11/01/2020   Coronary artery disease of native artery of native heart with stable angina pectoris (HCC) 11/01/2020   Mild intermittent asthma without complication 11/01/2020   Moderate episode of recurrent major depressive disorder (HCC) 11/01/2020   Psoriasis 11/01/2020   Uncomplicated opioid dependence (HCC) 11/01/2020   Primary osteoarthritis involving multiple joints 11/01/2020   Degenerative lumbar disc 11/01/2020   Mixed hyperlipidemia 06/28/2020   Malaise and fatigue 06/28/2020   Pneumonia 08/2016   History of arthroplasty of right knee 10/20/2015   Sepsis, unspecified organism (HCC) 10/20/2015   Primary osteoarthritis of right knee 09/30/2015   Morbid obesity (HCC) 08/23/2015   Essential (primary) hypertension 08/23/2015   Degeneration of intervertebral disc of lumbosacral region 08/23/2015   Generalized abdominal pain 08/23/2015   Class 2 severe obesity due to excess calories with serious comorbidity and body mass index (BMI) of 38.0 to 38.9 in adult (HCC) 08/23/2015   Type 2 diabetes mellitus without complications (HCC) 08/23/2015   Pain in lower limb 12/07/2013   Plantar fasciitis of left foot 11/01/2013   Metatarsal deformity 11/01/2013   Diabetes (HCC) 11/01/2013   Type 2 diabetes mellitus without complication, with long-term current use of insulin (HCC) 11/01/2013    DEHYDRATION 01/26/2009   DYSTHYMIC DISORDER 01/26/2009   ATHEROSLERO NATV ART EXTREM W/INTERMIT CLAUDICAT 01/26/2009   CLAUDICATION 01/26/2009   DYSPNEA 01/26/2009   CHEST PAIN UNSPECIFIED 01/26/2009   Past Medical History:  Diagnosis Date   AKI (acute kidney injury) (HCC) 11/11/2022   01/14/23,  numbers improved   Anemia of unknown etiology    Anxiety disorder    Arthritis    ATHEROSLERO NATV ART EXTREM W/INTERMIT CLAUDICAT 01/26/2009   Qualifier: Diagnosis of  By: Valinda Party RN, Nancy     Back pain of lumbar region with sciatica 01/31/2021   Benign prostatic hyperplasia with urinary frequency  CHEST PAIN UNSPECIFIED 01/26/2009   Qualifier: Diagnosis of  By: Valinda Party RN, Rosario Jacks 2 severe obesity due to excess calories with serious comorbidity and body mass index (BMI) of 38.0 to 38.9 in adult [facility])    CLAUDICATION 01/26/2009   Qualifier: Diagnosis of  By: Valinda Party RN, Nancy     Colonic mass 03/04/2022   Coronary artery disease    minimal CAD '12; NL stress test 06/10/14 ([facility])   Coronary artery disease of native artery of native heart with stable angina pectoris (HCC)    Degeneration of intervertebral disc of lumbosacral region 08/23/2015   Degenerative disc disease, lumbar    Degenerative lumbar disc 11/01/2020   Formatting of this note might be different from the original. Was recommended back surgery. Holding out.   Dehydration 01/26/2009   Qualifier: Diagnosis of  By: Valinda Party RN, Nancy     Depression    Diabetes mellitus without complication (HCC)    DYSPNEA 01/26/2009   Qualifier: Diagnosis of  By: Valinda Party RN, Harriett Sine     Dysthymic disorder 01/26/2009   Qualifier: Diagnosis of  By: Valinda Party RN, Nancy     Elevated alkaline phosphatase level 11/27/2020   Epididymoorchitis 12/31/2020   Essential (primary) hypertension 08/23/2015   Family history of adverse reaction to anesthesia    "it made my son sick"   Gangrene of toe of right foot (HCC) 11/11/2022    Generalized abdominal pain 08/23/2015   GERD (gastroesophageal reflux disease)    History of arthroplasty of right knee    Hyperlipemia    Iron deficiency anemia due to chronic blood loss 11/07/2020   Malaise and fatigue 06/28/2020   Metatarsal deformity 11/01/2013   Mild intermittent asthma without complication    Mixed hyperlipidemia    Moderate episode of recurrent major depressive disorder (HCC)    Morbid obesity (HCC) 08/23/2015   Myocardial infarction (HCC)    2012; hospitalized [facility] for chest pain syndrome 02/2011 and LHC showed trivial CAD   Neuropathy    Noncompliance 05/30/2021   Pain in lower limb 12/07/2013   Plantar fasciitis of left foot 11/01/2013   Pneumonia 08/2016   surgery had to be rescheduled due to pneumonia   Primary insomnia 12/19/2021   Formatting of this note might be different from the original.  Avoid Rx   Primary osteoarthritis involving multiple joints 11/01/2020   Formatting of this note might be different from the original. both knees replaced.   Primary osteoarthritis of left knee 09/06/2016   Primary osteoarthritis of right knee 09/30/2015   Psoriasis    Sepsis, unspecified organism (HCC) 10/20/2015   Skin ulcer of right great toe (HCC) 01/07/2022   Status post partial colectomy 10/04/2022   Type 2 diabetes mellitus with diabetic polyneuropathy, with long-term current use of insulin (HCC) 04/03/2022   Type 2 diabetes mellitus without complications (HCC) 08/23/2015   Uncomplicated opioid dependence (HCC)    Vitamin B12 deficiency 11/02/2020    Family History  Problem Relation Age of Onset   Cancer Mother    Diabetes Mother    Hyperlipidemia Father    Hypertension Father    Heart failure Father    Heart failure Brother     Past Surgical History:  Procedure Laterality Date   AMPUTATION Right 11/15/2022   Procedure: RIGHT TRANSMETATARSAL AMPUTATION;  Surgeon: Nadara Mustard, MD;  Location: [facility] OR;  Service: Orthopedics;  Laterality: Right;    AMPUTATION Right 12/20/2022   Procedure: RIGHT CHOPART AMPUTATION;  Surgeon:  Nadara Mustard, MD;  Location: [facility] OR;  Service: Orthopedics;  Laterality: Right;   AMPUTATION Right 01/15/2023   Procedure: RIGHT BELOW KNEE AMPUTATION;  Surgeon: Nadara Mustard, MD;  Location: [facility] OR;  Service: Orthopedics;  Laterality: Right;   CARDIAC CATHETERIZATION     03/28/11 LHC: NL LM, LAD; 10% mCX, mRCA. EF 65%. (Dr. Dot Been, [facility])   CHOLECYSTECTOMY     CORONARY ANGIOPLASTY     [facility]    ESOPHAGEAL DILATION     SEVERAL TIMES   Heel Spur Resection with Fasiotomy Left 11/25/2013   @ [facility]   KNEE ARTHROSCOPY Bilateral    SHOULDER ARTHROSCOPY WITH OPEN ROTATOR CUFF REPAIR Right    TOTAL KNEE ARTHROPLASTY Right 10/04/2015   Procedure: RIGHT TOTAL KNEE ARTHROPLASTY;  Surgeon: Gean Birchwood, MD;  Location: [facility] OR;  Service: Orthopedics;  Laterality: Right;   TOTAL KNEE ARTHROPLASTY Left 10/14/2016   TOTAL KNEE ARTHROPLASTY Left 10/14/2016   Procedure: LEFT TOTAL KNEE ARTHROPLASTY;  Surgeon: Gean Birchwood, MD;  Location: [facility] OR;  Service: Orthopedics;  Laterality: Left;   TUMOR REMOVAL     FATTY TUMOR  RT SHOULDER   Social History   Occupational History   Not on file  Tobacco Use   Smoking status: Never   Smokeless tobacco: Never  Vaping Use   Vaping status: Never Used  Substance and Sexual Activity   Alcohol use: No   Drug use: No   Sexual activity: Not on file

## 2023-02-12 ENCOUNTER — Encounter: Payer: Medicare Other | Admitting: Family

## 2023-02-17 ENCOUNTER — Telehealth: Payer: Self-pay | Admitting: Orthopedic Surgery

## 2023-02-17 NOTE — Telephone Encounter (Signed)
This pt is s/p a right BKA 6/189/2024. Requesting refill of hydrocodone has received the following Hydrocodone 10/325 #30 on 7/15 Hydrocodone 5/325 #30 7/8 Oxycodoen 5 #42 7/5  QTY 102 in the past 22 days please advise.

## 2023-02-17 NOTE — Telephone Encounter (Signed)
Patient called, he would like hydrocodone called [facility] on [address]

## 2023-02-17 NOTE — Telephone Encounter (Signed)
I called pt and advised of message below. He asked if there was anything that he could do to help with the pain advised that he should work on post op swelling by using his shrinker or ace for compression. Verbalized understanding and will call with any other questions.

## 2023-02-24 ENCOUNTER — Telehealth: Payer: Self-pay | Admitting: Orthopedic Surgery

## 2023-02-24 NOTE — Telephone Encounter (Signed)
This pt is s/p a right BKA 6/189/2024. Requesting refill of hydrocodone has received the following Hydrocodone 10/325 #30 on 7/15 Hydrocodone 5/325 #30 7/8 Oxycodone 5 #42 7/5  QTY 102 in the last 29 days

## 2023-02-24 NOTE — Telephone Encounter (Signed)
Rx refill Hydrocodone  [facility] on [address] / 

## 2023-02-24 NOTE — Telephone Encounter (Signed)
Will hold to discuss pt has an appt sch for tomorrow with Dr. Lajoyce Corners.

## 2023-02-25 ENCOUNTER — Ambulatory Visit: Payer: Medicare Other | Admitting: Orthopedic Surgery

## 2023-02-25 ENCOUNTER — Telehealth: Payer: Self-pay | Admitting: Orthopedic Surgery

## 2023-02-25 ENCOUNTER — Encounter: Payer: Self-pay | Admitting: Orthopedic Surgery

## 2023-02-25 DIAGNOSIS — T8781 Dehiscence of amputation stump: Secondary | ICD-10-CM

## 2023-02-25 DIAGNOSIS — Z89511 Acquired absence of right leg below knee: Secondary | ICD-10-CM

## 2023-02-25 DIAGNOSIS — S88111D Complete traumatic amputation at level between knee and ankle, right lower leg, subsequent encounter: Secondary | ICD-10-CM

## 2023-02-25 NOTE — Progress Notes (Signed)
Office Visit Note   Patient: Kyle [name]           Date of Birth: 1961-07-20           MRN: [id number] Visit Date: 02/25/2023              Requested by: [name], MD [address] [place],  [place] [address] PCP: [name], MD  Chief Complaint  Patient presents with   Right Leg - Routine Post Op    01/15/2023 right BKA      HPI: Patient is a 62 year old gentleman who is status post right below-knee amputation.  Patient complains of persistent pain.  He is on Neurontin 600 mg 3 times a day.  Assessment & Plan: Visit Diagnoses:  1. Below-knee amputation of right lower extremity, subsequent encounter (HCC)   2. Dehiscence of amputation stump of right lower extremity (HCC)     Plan: Will remove staples today continue with Dial soap cleansing 4 x 4 gauze Ace wrap and a stump shrinker.  Patient has 134 pain prescriptions filled in the past month.  No further prescriptions at this time.  Follow-Up Instructions: Return in about 2 weeks (around 03/11/2023).   Ortho Exam  Patient is alert, oriented, no adenopathy, well-dressed, normal affect, normal respiratory effort. Examination the wound edges are well-approximated there is no cellulitis no drainage no wound dehiscence.  Imaging: No results found. No images are attached to the encounter.  Labs: Lab Results  Component Value Date   HGBA1C 7.0 (H) 11/12/2022   HGBA1C 9.2 (H) 10/14/2016   HGBA1C 8.8 (H) 09/03/2016   ESRSEDRATE 79 (H) 11/11/2022   CRP 18.4 (H) 11/11/2022   REPTSTATUS 11/16/2022 FINAL 11/11/2022   CULT  11/11/2022    NO GROWTH 5 DAYS Performed at [facility], [address], [place], [place] [address]      Lab Results  Component Value Date   ALBUMIN 3.4 (L) 11/11/2022    Lab Results  Component Value Date   MG 1.8 11/15/2022   MG 1.6 (L) 11/14/2022   No results found for: "VD25OH"  No results found for: "PREALBUMIN"    Latest Ref Rng & Units 01/15/2023   10:02 AM  11/13/2022    1:06 AM 11/12/2022    2:53 AM  CBC EXTENDED  WBC 4.0 - 10.5 K/uL 10.0  8.4  11.1   RBC 4.22 - 5.81 MIL/uL 3.84  3.72  3.39   Hemoglobin 13.0 - 17.0 g/dL 40.1  02.7  9.2   HCT 25.3 - 52.0 % 33.5  32.1  29.3   Platelets 150 - 400 K/uL 462  320  294   NEUT# 1.7 - 7.7 K/uL  4.7    Lymph# 0.7 - 4.0 K/uL  2.4       There is no height or weight on file to calculate BMI.  Orders:  No orders of the defined types were placed in this encounter.  No orders of the defined types were placed in this encounter.    Procedures: No procedures performed  Clinical Data: No additional findings.  ROS:  All other systems negative, except as noted in the HPI. Review of Systems  Objective: Vital Signs: There were no vitals taken for this visit.  Specialty Comments:  No specialty comments available.  PMFS History: Patient Active Problem List   Diagnosis Date Noted   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   Dehiscence of amputation stump of right lower  extremity (HCC) 12/20/2022   Gangrene of toe of right foot (HCC) 11/11/2022   AKI (acute kidney injury) (HCC) 11/11/2022   Status post partial colectomy 10/04/2022   Type 2 diabetes mellitus with hyperglycemia, with long-term current use of insulin (HCC) 04/03/2022   Type 2 diabetes mellitus with diabetic polyneuropathy, with long-term current use of insulin (HCC) 04/03/2022   Diabetes mellitus (HCC) 04/03/2022   Colonic mass 03/04/2022   Skin ulcer of right great toe (HCC) 01/07/2022   Primary insomnia 12/19/2021   Noncompliance 05/30/2021   Back pain of lumbar region with sciatica 01/31/2021   Epididymoorchitis 12/31/2020   Anemia of unknown etiology    Arthritis    Coronary artery disease    Degenerative disc disease, lumbar    Depression    Diabetes mellitus without complication (HCC)    GERD (gastroesophageal reflux disease)    Family history of adverse reaction to anesthesia    Hyperlipemia     Myocardial infarction (HCC)    Elevated alkaline phosphatase level 11/27/2020   Iron deficiency anemia due to chronic blood loss 11/07/2020   Vitamin B12 deficiency 11/02/2020   Anxiety disorder 11/01/2020   Benign prostatic hyperplasia with urinary frequency 11/01/2020   Coronary artery disease of native artery of native heart with stable angina pectoris (HCC) 11/01/2020   Mild intermittent asthma without complication 11/01/2020   Moderate episode of recurrent major depressive disorder (HCC) 11/01/2020   Psoriasis 11/01/2020   Uncomplicated opioid dependence (HCC) 11/01/2020   Primary osteoarthritis involving multiple joints 11/01/2020   Degenerative lumbar disc 11/01/2020   Mixed hyperlipidemia 06/28/2020   Malaise and fatigue 06/28/2020   Pneumonia 08/2016   History of arthroplasty of right knee 10/20/2015   Sepsis, unspecified organism (HCC) 10/20/2015   Primary osteoarthritis of right knee 09/30/2015   Morbid obesity (HCC) 08/23/2015   Essential (primary) hypertension 08/23/2015   Degeneration of intervertebral disc of lumbosacral region 08/23/2015   Generalized abdominal pain 08/23/2015   Class 2 severe obesity due to excess calories with serious comorbidity and body mass index (BMI) of 38.0 to 38.9 in adult (HCC) 08/23/2015   Type 2 diabetes mellitus without complications (HCC) 08/23/2015   Pain in lower limb 12/07/2013   Plantar fasciitis of left foot 11/01/2013   Metatarsal deformity 11/01/2013   Diabetes (HCC) 11/01/2013   Type 2 diabetes mellitus without complication, with long-term current use of insulin (HCC) 11/01/2013   DEHYDRATION 01/26/2009   DYSTHYMIC DISORDER 01/26/2009   ATHEROSLERO NATV ART EXTREM W/INTERMIT CLAUDICAT 01/26/2009   CLAUDICATION 01/26/2009   DYSPNEA 01/26/2009   CHEST PAIN UNSPECIFIED 01/26/2009   Past Medical History:  Diagnosis Date   AKI (acute kidney injury) (HCC) 11/11/2022   01/14/23,  numbers improved   Anemia of unknown etiology     Anxiety disorder    Arthritis    ATHEROSLERO NATV ART EXTREM W/INTERMIT CLAUDICAT 01/26/2009   Qualifier: Diagnosis of  By: Valinda Party RN, Nancy     Back pain of lumbar region with sciatica 01/31/2021   Benign prostatic hyperplasia with urinary frequency    CHEST PAIN UNSPECIFIED 01/26/2009   Qualifier: Diagnosis of  By: Valinda Party RN, Harriett Sine     Class 2 severe obesity due to excess calories with serious comorbidity and body mass index (BMI) of 38.0 to 38.9 in adult [facility])    CLAUDICATION 01/26/2009   Qualifier: Diagnosis of  By: Valinda Party RN, Nancy     Colonic mass 03/04/2022   Coronary artery disease    minimal CAD '12; NL  stress test 06/10/14 ([facility])   Coronary artery disease of native artery of native heart with stable angina pectoris (HCC)    Degeneration of intervertebral disc of lumbosacral region 08/23/2015   Degenerative disc disease, lumbar    Degenerative lumbar disc 11/01/2020   Formatting of this note might be different from the original. Was recommended back surgery. Holding out.   Dehydration 01/26/2009   Qualifier: Diagnosis of  By: Valinda Party RN, Nancy     Depression    Diabetes mellitus without complication (HCC)    DYSPNEA 01/26/2009   Qualifier: Diagnosis of  By: Valinda Party RN, Harriett Sine     Dysthymic disorder 01/26/2009   Qualifier: Diagnosis of  By: Valinda Party RN, Nancy     Elevated alkaline phosphatase level 11/27/2020   Epididymoorchitis 12/31/2020   Essential (primary) hypertension 08/23/2015   Family history of adverse reaction to anesthesia    "it made my son sick"   Gangrene of toe of right foot (HCC) 11/11/2022   Generalized abdominal pain 08/23/2015   GERD (gastroesophageal reflux disease)    History of arthroplasty of right knee    Hyperlipemia    Iron deficiency anemia due to chronic blood loss 11/07/2020   Malaise and fatigue 06/28/2020   Metatarsal deformity 11/01/2013   Mild intermittent asthma without complication    Mixed hyperlipidemia    Moderate  episode of recurrent major depressive disorder (HCC)    Morbid obesity (HCC) 08/23/2015   Myocardial infarction (HCC)    2012; hospitalized [facility] for chest pain syndrome 02/2011 and LHC showed trivial CAD   Neuropathy    Noncompliance 05/30/2021   Pain in lower limb 12/07/2013   Plantar fasciitis of left foot 11/01/2013   Pneumonia 08/2016   surgery had to be rescheduled due to pneumonia   Primary insomnia 12/19/2021   Formatting of this note might be different from the original.  Avoid Rx   Primary osteoarthritis involving multiple joints 11/01/2020   Formatting of this note might be different from the original. both knees replaced.   Primary osteoarthritis of left knee 09/06/2016   Primary osteoarthritis of right knee 09/30/2015   Psoriasis    Sepsis, unspecified organism (HCC) 10/20/2015   Skin ulcer of right great toe (HCC) 01/07/2022   Status post partial colectomy 10/04/2022   Type 2 diabetes mellitus with diabetic polyneuropathy, with long-term current use of insulin (HCC) 04/03/2022   Type 2 diabetes mellitus without complications (HCC) 08/23/2015   Uncomplicated opioid dependence (HCC)    Vitamin B12 deficiency 11/02/2020    Family History  Problem Relation Age of Onset   Cancer Mother    Diabetes Mother    Hyperlipidemia Father    Hypertension Father    Heart failure Father    Heart failure Brother     Past Surgical History:  Procedure Laterality Date   AMPUTATION Right 11/15/2022   Procedure: RIGHT TRANSMETATARSAL AMPUTATION;  Surgeon: Nadara Mustard, MD;  Location: [facility] OR;  Service: Orthopedics;  Laterality: Right;   AMPUTATION Right 12/20/2022   Procedure: RIGHT CHOPART AMPUTATION;  Surgeon: Nadara Mustard, MD;  Location: [facility] OR;  Service: Orthopedics;  Laterality: Right;   AMPUTATION Right 01/15/2023   Procedure: RIGHT BELOW KNEE AMPUTATION;  Surgeon: Nadara Mustard, MD;  Location: [facility] OR;  Service: Orthopedics;  Laterality: Right;   CARDIAC CATHETERIZATION     03/28/11  LHC: NL LM, LAD; 10% mCX, mRCA. EF 65%. (Dr. Dot Been, [facility])   CHOLECYSTECTOMY     CORONARY ANGIOPLASTY  [facility]    ESOPHAGEAL DILATION     SEVERAL TIMES   Heel Spur Resection with Fasiotomy Left 11/25/2013   @ [facility]   KNEE ARTHROSCOPY Bilateral    SHOULDER ARTHROSCOPY WITH OPEN ROTATOR CUFF REPAIR Right    TOTAL KNEE ARTHROPLASTY Right 10/04/2015   Procedure: RIGHT TOTAL KNEE ARTHROPLASTY;  Surgeon: Gean Birchwood, MD;  Location: [facility] OR;  Service: Orthopedics;  Laterality: Right;   TOTAL KNEE ARTHROPLASTY Left 10/14/2016   TOTAL KNEE ARTHROPLASTY Left 10/14/2016   Procedure: LEFT TOTAL KNEE ARTHROPLASTY;  Surgeon: Gean Birchwood, MD;  Location: [facility] OR;  Service: Orthopedics;  Laterality: Left;   TUMOR REMOVAL     FATTY TUMOR  RT SHOULDER   Social History   Occupational History   Not on file  Tobacco Use   Smoking status: Never   Smokeless tobacco: Never  Vaping Use   Vaping status: Never Used  Substance and Sexual Activity   Alcohol use: No   Drug use: No   Sexual activity: Not on file

## 2023-02-25 NOTE — Telephone Encounter (Signed)
Patient called advised he spoke with the pharmacy and they said the provider only called in a seven day supply of Hydrocodone 10-325. Patient asked if Dr. Lajoyce Corners would refill the Rx. Patient uses [facility] on [address].  The number to contact patient is [phone]

## 2023-02-26 ENCOUNTER — Other Ambulatory Visit: Payer: Self-pay | Admitting: Orthopedic Surgery

## 2023-02-26 MED ORDER — HYDROCODONE-ACETAMINOPHEN 10-325 MG PO TABS: 1.0000 | ORAL_TABLET | Freq: Four times a day (QID) | ORAL | 0 refills | Status: DC | PRN

## 2023-02-26 NOTE — Telephone Encounter (Signed)
I called pt to advise voiced understanding and will call with any other questions.

## 2023-02-26 NOTE — Telephone Encounter (Signed)
I called the pharmacy and they advised that the pt received rx  hydrocodone 7/15 # 30 7/9 #30 and 7/8 #30 all written from our office and filled by that pharmacy. Please see below and advise.

## 2023-02-27 ENCOUNTER — Encounter: Payer: Self-pay | Admitting: Internal Medicine

## 2023-03-03 ENCOUNTER — Telehealth: Payer: Self-pay | Admitting: Internal Medicine

## 2023-03-03 NOTE — Telephone Encounter (Signed)
Patient dismissed:  We find it necessary to inform you that [facility] and app providers practicing at this clinic wil no longer be able to provide medical care to you. We have come to this decision based on our No Show Policy. 02/27/23

## 2023-03-04 ENCOUNTER — Telehealth: Payer: Self-pay | Admitting: Orthopedic Surgery

## 2023-03-04 NOTE — Telephone Encounter (Signed)
I called pt and advised that he can not drive. He can not drive while on narcotic pain medication and that he will also need to contact DMV and update his driving license to reflect his BKA and may need to take a road test but we can discuss this at his next visit or he can call with additional questions.

## 2023-03-04 NOTE — Telephone Encounter (Signed)
Patient called asked if he can drive? Patient said he feel like he can drive now. The number to contact patient is [phone]

## 2023-03-11 ENCOUNTER — Encounter: Payer: Medicare Other | Admitting: Orthopedic Surgery

## 2023-03-19 ENCOUNTER — Encounter: Payer: Medicare Other | Admitting: Family

## 2023-03-26 ENCOUNTER — Ambulatory Visit (HOSPITAL_BASED_OUTPATIENT_CLINIC_OR_DEPARTMENT_OTHER): Payer: Medicare Other | Admitting: Student

## 2023-03-27 ENCOUNTER — Ambulatory Visit (INDEPENDENT_AMBULATORY_CARE_PROVIDER_SITE_OTHER): Payer: Medicare Other | Admitting: Physician Assistant

## 2023-03-27 ENCOUNTER — Encounter: Payer: Self-pay | Admitting: Physician Assistant

## 2023-03-27 ENCOUNTER — Ambulatory Visit (HOSPITAL_BASED_OUTPATIENT_CLINIC_OR_DEPARTMENT_OTHER): Payer: Medicare Other | Admitting: Student

## 2023-03-27 DIAGNOSIS — S88111S Complete traumatic amputation at level between knee and ankle, right lower leg, sequela: Secondary | ICD-10-CM

## 2023-03-27 DIAGNOSIS — Z89511 Acquired absence of right leg below knee: Secondary | ICD-10-CM

## 2023-03-27 MED ORDER — DOXYCYCLINE HYCLATE 100 MG PO TABS: 100.0000 mg | ORAL_TABLET | Freq: Two times a day (BID) | ORAL | 0 refills | Status: DC

## 2023-03-27 NOTE — Progress Notes (Signed)
Office Visit Note   Patient: Kyle [name]           Date of Birth: 05-Sep-1960           MRN: [id number] Visit Date: 03/27/2023              Requested by: [name], MD [address] [place],  [place] [address] PCP: [name], MD  No chief complaint on file.     HPI: Patient is a pleasant 62 year old gentleman who is 10 weeks status post right below-knee amputation with Dr. Lajoyce Corners.  He last saw Dr. Lajoyce Corners about a month ago.  At that time he had some wound dehiscence but was told to use his shrinker and do cleansing with Dial soap.  He was supposed to follow-up in 2 weeks with Dr. Lajoyce Corners however he did not have a ride.  He comes in today complaining that he thought the incision area looked a little bit more red.  Denies any fever or chills.  He does have eczema.  He denies much pain.  Assessment & Plan: Visit Diagnoses right below-knee amputation  Plan10-week status post right below-knee amputation.  Overall his stump looks fairly good he has a little erythema at the stump but wound is well-healed with just 1 very small spot of blood in the central portion.  He was wearing a dressing with a shrinker that is too large for him over the top of the dressing.  I did tell him to continue to do the daily cleansing I did order them a smaller shrinker and I told him to apply the shrinker directly to the skin.  I do not think this is infective however given his history given the amount of erythema that he feels is different I will call him in some doxycycline.  He will follow-up with Erin with an 2 weeks.  I did tell him if he had any increasing redness or pain he should contact Dr. Audrie Lia team immediately  Follow-Up Instructions: No follow-ups on file.   Ortho Exam  Patient is alert, oriented, no adenopathy, well-dressed, normal affect, normal respiratory effort. Right below-knee amputation he does have some eczema over his knee.  He has well opposed wound edges with some erythema but no  cellulitis.  No fluctuance.  Just a spot of bloody drainage from the central portion of the wound.  He has almost no swelling he has good flexion and extension of his leg.  He does not have any particular tenderness to palpation  Imaging: No results found. No images are attached to the encounter.  Labs: Lab Results  Component Value Date   HGBA1C 7.0 (H) 11/12/2022   HGBA1C 9.2 (H) 10/14/2016   HGBA1C 8.8 (H) 09/03/2016   ESRSEDRATE 79 (H) 11/11/2022   CRP 18.4 (H) 11/11/2022   REPTSTATUS 11/16/2022 FINAL 11/11/2022   CULT  11/11/2022    NO GROWTH 5 DAYS Performed at [facility], [address] [place], [place] [address]      Lab Results  Component Value Date   ALBUMIN 3.4 (L) 11/11/2022    Lab Results  Component Value Date   MG 1.8 11/15/2022   MG 1.6 (L) 11/14/2022   No results found for: "VD25OH"  No results found for: "PREALBUMIN"    Latest Ref Rng & Units 01/15/2023   10:02 AM 11/13/2022    1:06 AM 11/12/2022    2:53 AM  CBC EXTENDED  WBC 4.0 - 10.5 K/uL 10.0  8.4  11.1   RBC 4.22 - 5.81 MIL/uL 3.84  3.72  3.39   Hemoglobin 13.0 - 17.0 g/dL 40.9  81.1  9.2   HCT 91.4 - 52.0 % 33.5  32.1  29.3   Platelets 150 - 400 K/uL 462  320  294   NEUT# 1.7 - 7.7 K/uL  4.7    Lymph# 0.7 - 4.0 K/uL  2.4       There is no height or weight on file to calculate BMI.  Orders:  No orders of the defined types were placed in this encounter.  Meds ordered this encounter  Medications   doxycycline (VIBRA-TABS) 100 MG tablet    Sig: Take 1 tablet (100 mg total) by mouth 2 (two) times daily.    Dispense:  60 tablet    Refill:  0     Procedures: No procedures performed  Clinical Data: No additional findings.  ROS:  All other systems negative, except as noted in the HPI. Review of Systems  Objective: Vital Signs: There were no vitals taken for this visit.  Specialty Comments:  No specialty comments available.  PMFS History: Patient Active Problem List    Diagnosis Date Noted   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   Dehiscence of amputation stump of right lower extremity (HCC) 12/20/2022   Gangrene of toe of right foot (HCC) 11/11/2022   AKI (acute kidney injury) (HCC) 11/11/2022   Status post partial colectomy 10/04/2022   Type 2 diabetes mellitus with hyperglycemia, with long-term current use of insulin (HCC) 04/03/2022   Type 2 diabetes mellitus with diabetic polyneuropathy, with long-term current use of insulin (HCC) 04/03/2022   Diabetes mellitus (HCC) 04/03/2022   Colonic mass 03/04/2022   Skin ulcer of right great toe (HCC) 01/07/2022   Primary insomnia 12/19/2021   Noncompliance 05/30/2021   Back pain of lumbar region with sciatica 01/31/2021   Epididymoorchitis 12/31/2020   Anemia of unknown etiology    Arthritis    Coronary artery disease    Degenerative disc disease, lumbar    Depression    Diabetes mellitus without complication (HCC)    GERD (gastroesophageal reflux disease)    Family history of adverse reaction to anesthesia    Hyperlipemia    Myocardial infarction (HCC)    Elevated alkaline phosphatase level 11/27/2020   Iron deficiency anemia due to chronic blood loss 11/07/2020   Vitamin B12 deficiency 11/02/2020   Anxiety disorder 11/01/2020   Benign prostatic hyperplasia with urinary frequency 11/01/2020   Coronary artery disease of native artery of native heart with stable angina pectoris (HCC) 11/01/2020   Mild intermittent asthma without complication 11/01/2020   Moderate episode of recurrent major depressive disorder (HCC) 11/01/2020   Psoriasis 11/01/2020   Uncomplicated opioid dependence (HCC) 11/01/2020   Primary osteoarthritis involving multiple joints 11/01/2020   Degenerative lumbar disc 11/01/2020   Mixed hyperlipidemia 06/28/2020   Malaise and fatigue 06/28/2020   Pneumonia 08/2016   History of arthroplasty of right knee 10/20/2015   Sepsis, unspecified organism (HCC)  10/20/2015   Primary osteoarthritis of right knee 09/30/2015   Morbid obesity (HCC) 08/23/2015   Essential (primary) hypertension 08/23/2015   Degeneration of intervertebral disc of lumbosacral region 08/23/2015   Generalized abdominal pain 08/23/2015   Class 2 severe obesity due to excess calories with serious comorbidity and body mass index (BMI) of 38.0 to 38.9 in adult [place]) 08/23/2015   Type 2 diabetes mellitus without complications (HCC) 08/23/2015   Pain  in lower limb 12/07/2013   Plantar fasciitis of left foot 11/01/2013   Metatarsal deformity 11/01/2013   Diabetes (HCC) 11/01/2013   Type 2 diabetes mellitus without complication, with long-term current use of insulin (HCC) 11/01/2013   DEHYDRATION 01/26/2009   DYSTHYMIC DISORDER 01/26/2009   ATHEROSLERO NATV ART EXTREM W/INTERMIT CLAUDICAT 01/26/2009   CLAUDICATION 01/26/2009   DYSPNEA 01/26/2009   CHEST PAIN UNSPECIFIED 01/26/2009   Past Medical History:  Diagnosis Date   AKI (acute kidney injury) (HCC) 11/11/2022   01/14/23,  numbers improved   Anemia of unknown etiology    Anxiety disorder    Arthritis    ATHEROSLERO NATV ART EXTREM W/INTERMIT CLAUDICAT 01/26/2009   Qualifier: Diagnosis of  By: Valinda Party RN, Nancy     Back pain of lumbar region with sciatica 01/31/2021   Benign prostatic hyperplasia with urinary frequency    CHEST PAIN UNSPECIFIED 01/26/2009   Qualifier: Diagnosis of  By: Valinda Party RN, Harriett Sine     Class 2 severe obesity due to excess calories with serious comorbidity and body mass index (BMI) of 38.0 to 38.9 in adult [facility])    CLAUDICATION 01/26/2009   Qualifier: Diagnosis of  By: Valinda Party RN, Nancy     Colonic mass 03/04/2022   Coronary artery disease    minimal CAD '12; NL stress test 06/10/14 ([facility])   Coronary artery disease of native artery of native heart with stable angina pectoris (HCC)    Degeneration of intervertebral disc of lumbosacral region 08/23/2015   Degenerative disc disease, lumbar     Degenerative lumbar disc 11/01/2020   Formatting of this note might be different from the original. Was recommended back surgery. Holding out.   Dehydration 01/26/2009   Qualifier: Diagnosis of  By: Valinda Party RN, Nancy     Depression    Diabetes mellitus without complication (HCC)    DYSPNEA 01/26/2009   Qualifier: Diagnosis of  By: Valinda Party RN, Harriett Sine     Dysthymic disorder 01/26/2009   Qualifier: Diagnosis of  By: Valinda Party RN, Nancy     Elevated alkaline phosphatase level 11/27/2020   Epididymoorchitis 12/31/2020   Essential (primary) hypertension 08/23/2015   Family history of adverse reaction to anesthesia    "it made my son sick"   Gangrene of toe of right foot (HCC) 11/11/2022   Generalized abdominal pain 08/23/2015   GERD (gastroesophageal reflux disease)    History of arthroplasty of right knee    Hyperlipemia    Iron deficiency anemia due to chronic blood loss 11/07/2020   Malaise and fatigue 06/28/2020   Metatarsal deformity 11/01/2013   Mild intermittent asthma without complication    Mixed hyperlipidemia    Moderate episode of recurrent major depressive disorder (HCC)    Morbid obesity (HCC) 08/23/2015   Myocardial infarction (HCC)    2012; hospitalized [facility] for chest pain syndrome 02/2011 and LHC showed trivial CAD   Neuropathy    Noncompliance 05/30/2021   Pain in lower limb 12/07/2013   Plantar fasciitis of left foot 11/01/2013   Pneumonia 08/2016   surgery had to be rescheduled due to pneumonia   Primary insomnia 12/19/2021   Formatting of this note might be different from the original.  Avoid Rx   Primary osteoarthritis involving multiple joints 11/01/2020   Formatting of this note might be different from the original. both knees replaced.   Primary osteoarthritis of left knee 09/06/2016   Primary osteoarthritis of right knee 09/30/2015   Psoriasis    Sepsis, unspecified organism (HCC) 10/20/2015  Skin ulcer of right great toe (HCC) 01/07/2022   Status post  partial colectomy 10/04/2022   Type 2 diabetes mellitus with diabetic polyneuropathy, with long-term current use of insulin (HCC) 04/03/2022   Type 2 diabetes mellitus without complications (HCC) 08/23/2015   Uncomplicated opioid dependence (HCC)    Vitamin B12 deficiency 11/02/2020    Family History  Problem Relation Age of Onset   Cancer Mother    Diabetes Mother    Hyperlipidemia Father    Hypertension Father    Heart failure Father    Heart failure Brother     Past Surgical History:  Procedure Laterality Date   AMPUTATION Right 11/15/2022   Procedure: RIGHT TRANSMETATARSAL AMPUTATION;  Surgeon: Nadara Mustard, MD;  Location[facility] OR;  Service: Orthopedics;  Laterality: Right;   AMPUTATION Right 12/20/2022   Procedure: RIGHT CHOPART AMPUTATION;  Surgeon: Nadara Mustard, MD;  Location: [facility] OR;  Service: Orthopedics;  Laterality: Right;   AMPUTATION Right 01/15/2023   Procedure: RIGHT BELOW KNEE AMPUTATION;  Surgeon: Nadara Mustard, MD;  Location: [facility] OR;  Service: Orthopedics;  Laterality: Right;   CARDIAC CATHETERIZATION     03/28/11 [facility]: NL LM, LAD; 10% mCX, mRCA. EF 65%. (Dr. Dot Been, [facility])   CHOLECYSTECTOMY     CORONARY ANGIOPLASTY     2012 [facility]    ESOPHAGEAL DILATION     SEVERAL TIMES   Heel Spur Resection with Fasiotomy Left 11/25/2013   @ [facility]   KNEE ARTHROSCOPY Bilateral    SHOULDER ARTHROSCOPY WITH OPEN ROTATOR CUFF REPAIR Right    TOTAL KNEE ARTHROPLASTY Right 10/04/2015   Procedure: RIGHT TOTAL KNEE ARTHROPLASTY;  Surgeon: Gean Birchwood, MD;  Location: [facility] OR;  Service: Orthopedics;  Laterality: Right;   TOTAL KNEE ARTHROPLASTY Left 10/14/2016   TOTAL KNEE ARTHROPLASTY Left 10/14/2016   Procedure: LEFT TOTAL KNEE ARTHROPLASTY;  Surgeon: Gean Birchwood, MD;  Location: [facility] OR;  Service: Orthopedics;  Laterality: Left;   TUMOR REMOVAL     FATTY TUMOR  RT SHOULDER   Social History   Occupational History   Not on file  Tobacco Use   Smoking status: Never   Smokeless  tobacco: Never  Vaping Use   Vaping status: Never Used  Substance and Sexual Activity   Alcohol use: No   Drug use: No   Sexual activity: Not on file

## 2023-04-04 ENCOUNTER — Telehealth: Payer: Self-pay | Admitting: Family

## 2023-04-04 NOTE — Telephone Encounter (Signed)
Patient called advised his wife do not get off work until late afternoon. Patient asked if he can be rescheduled for a 4:00 appointment? The number to contact patient is [phone]

## 2023-04-04 NOTE — Telephone Encounter (Signed)
SW pt, he is coming in on 04/07/23 at 4pm

## 2023-04-07 ENCOUNTER — Ambulatory Visit (INDEPENDENT_AMBULATORY_CARE_PROVIDER_SITE_OTHER): Payer: Medicare Other | Admitting: Orthopedic Surgery

## 2023-04-07 ENCOUNTER — Encounter: Payer: Self-pay | Admitting: Orthopedic Surgery

## 2023-04-07 DIAGNOSIS — S88111S Complete traumatic amputation at level between knee and ankle, right lower leg, sequela: Secondary | ICD-10-CM

## 2023-04-07 NOTE — Progress Notes (Signed)
Office Visit Note   Patient: Kyle [name]           Date of Birth: 1961/02/19           MRN: [id number] Visit Date: 04/07/2023              Requested by: [name], MD [address] [place],  [place] [address] PCP: [name], MD  Chief Complaint  Patient presents with   Right Leg - Routine Post Op    01/15/2023 right BKA       HPI: Patient is a 62 year old gentleman is 15-month status post right below-knee amputation.  He is currently wearing a stump shrinker feels well.  Assessment & Plan: Visit Diagnoses:  1. Below-knee amputation of right lower extremity, sequela (HCC)     Plan: Patient is provided a prescription for [employer] for K3 level prosthesis on the right.  Orders placed for gait training.  Follow-Up Instructions: Return in about 2 months (around 06/07/2023).   Ortho Exam  Patient is alert, oriented, no adenopathy, well-dressed, normal affect, normal respiratory effort. Examination patient has psoriasis over elbows and knees he is are stable there is a small abrasion on the knee that is healing well.  His right residual limb is healing well.  Patient is a new right transtibial  amputee.  Patient's current comorbidities are not expected to impact the ability to function with the prescribed prosthesis. Patient verbally communicates a strong desire to use a prosthesis. Patient currently requires mobility aids to ambulate without a prosthesis.  Expects not to use mobility aids with a new prosthesis.  Patient is a K3 level ambulator that spends a lot of time walking around on uneven terrain over obstacles, up and down stairs, and ambulates with a variable cadence.     Imaging: No results found. No images are attached to the encounter.  Labs: Lab Results  Component Value Date   HGBA1C 7.0 (H) 11/12/2022   HGBA1C 9.2 (H) 10/14/2016   HGBA1C 8.8 (H) 09/03/2016   ESRSEDRATE 79 (H) 11/11/2022   CRP 18.4 (H) 11/11/2022   REPTSTATUS 11/16/2022 FINAL  11/11/2022   CULT  11/11/2022    NO GROWTH 5 DAYS Performed at [facility], [address] [place], [place] [address]      Lab Results  Component Value Date   ALBUMIN 3.4 (L) 11/11/2022    Lab Results  Component Value Date   MG 1.8 11/15/2022   MG 1.6 (L) 11/14/2022   No results found for: "VD25OH"  No results found for: "PREALBUMIN"    Latest Ref Rng & Units 01/15/2023   10:02 AM 11/13/2022    1:06 AM 11/12/2022    2:53 AM  CBC EXTENDED  WBC 4.0 - 10.5 K/uL 10.0  8.4  11.1   RBC 4.22 - 5.81 MIL/uL 3.84  3.72  3.39   Hemoglobin 13.0 - 17.0 g/dL 36.6  44.0  9.2   HCT 34.7 - 52.0 % 33.5  32.1  29.3   Platelets 150 - 400 K/uL 462  320  294   NEUT# 1.7 - 7.7 K/uL  4.7    Lymph# 0.7 - 4.0 K/uL  2.4       There is no height or weight on file to calculate BMI.  Orders:  No orders of the defined types were placed in this encounter.  No orders of the defined types were placed in this encounter.    Procedures: No procedures performed  Clinical  Data: No additional findings.  ROS:  All other systems negative, except as noted in the HPI. Review of Systems  Objective: Vital Signs: There were no vitals taken for this visit.  Specialty Comments:  No specialty comments available.  PMFS History: Patient Active Problem List   Diagnosis Date Noted   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   Dehiscence of amputation stump of right lower extremity (HCC) 12/20/2022   Gangrene of toe of right foot (HCC) 11/11/2022   AKI (acute kidney injury) (HCC) 11/11/2022   Status post partial colectomy 10/04/2022   Type 2 diabetes mellitus with hyperglycemia, with long-term current use of insulin (HCC) 04/03/2022   Type 2 diabetes mellitus with diabetic polyneuropathy, with long-term current use of insulin (HCC) 04/03/2022   Diabetes mellitus (HCC) 04/03/2022   Colonic mass 03/04/2022   Skin ulcer of right great toe (HCC) 01/07/2022   Primary  insomnia 12/19/2021   Noncompliance 05/30/2021   Back pain of lumbar region with sciatica 01/31/2021   Epididymoorchitis 12/31/2020   Anemia of unknown etiology    Arthritis    Coronary artery disease    Degenerative disc disease, lumbar    Depression    Diabetes mellitus without complication (HCC)    GERD (gastroesophageal reflux disease)    Family history of adverse reaction to anesthesia    Hyperlipemia    Myocardial infarction (HCC)    Elevated alkaline phosphatase level 11/27/2020   Iron deficiency anemia due to chronic blood loss 11/07/2020   Vitamin B12 deficiency 11/02/2020   Anxiety disorder 11/01/2020   Benign prostatic hyperplasia with urinary frequency 11/01/2020   Coronary artery disease of native artery of native heart with stable angina pectoris (HCC) 11/01/2020   Mild intermittent asthma without complication 11/01/2020   Moderate episode of recurrent major depressive disorder (HCC) 11/01/2020   Psoriasis 11/01/2020   Uncomplicated opioid dependence (HCC) 11/01/2020   Primary osteoarthritis involving multiple joints 11/01/2020   Degenerative lumbar disc 11/01/2020   Mixed hyperlipidemia 06/28/2020   Malaise and fatigue 06/28/2020   Pneumonia 08/2016   History of arthroplasty of right knee 10/20/2015   Sepsis, unspecified organism (HCC) 10/20/2015   Primary osteoarthritis of right knee 09/30/2015   Morbid obesity (HCC) 08/23/2015   Essential (primary) hypertension 08/23/2015   Degeneration of intervertebral disc of lumbosacral region 08/23/2015   Generalized abdominal pain 08/23/2015   Class 2 severe obesity due to excess calories with serious comorbidity and body mass index (BMI) of 38.0 to 38.9 in adult (HCC) 08/23/2015   Type 2 diabetes mellitus without complications (HCC) 08/23/2015   Pain in lower limb 12/07/2013   Plantar fasciitis of left foot 11/01/2013   Metatarsal deformity 11/01/2013   Diabetes (HCC) 11/01/2013   Type 2 diabetes mellitus without  complication, with long-term current use of insulin (HCC) 11/01/2013   DEHYDRATION 01/26/2009   DYSTHYMIC DISORDER 01/26/2009   ATHEROSLERO NATV ART EXTREM W/INTERMIT CLAUDICAT 01/26/2009   CLAUDICATION 01/26/2009   DYSPNEA 01/26/2009   CHEST PAIN UNSPECIFIED 01/26/2009   Past Medical History:  Diagnosis Date   AKI (acute kidney injury) (HCC) 11/11/2022   01/14/23,  numbers improved   Anemia of unknown etiology    Anxiety disorder    Arthritis    ATHEROSLERO NATV ART EXTREM W/INTERMIT CLAUDICAT 01/26/2009   Qualifier: Diagnosis of  By: Valinda Party RN, Nancy     Back pain of lumbar region with sciatica 01/31/2021   Benign prostatic hyperplasia with urinary frequency    CHEST PAIN UNSPECIFIED 01/26/2009  Qualifier: Diagnosis of  By: Valinda Party RN, Rosario Jacks 2 severe obesity due to excess calories with serious comorbidity and body mass index (BMI) of 38.0 to 38.9 in adult [facility])    CLAUDICATION 01/26/2009   Qualifier: Diagnosis of  By: Valinda Party RN, Nancy     Colonic mass 03/04/2022   Coronary artery disease    minimal CAD '12; NL stress test 06/10/14 ([facility])   Coronary artery disease of native artery of native heart with stable angina pectoris (HCC)    Degeneration of intervertebral disc of lumbosacral region 08/23/2015   Degenerative disc disease, lumbar    Degenerative lumbar disc 11/01/2020   Formatting of this note might be different from the original. Was recommended back surgery. Holding out.   Dehydration 01/26/2009   Qualifier: Diagnosis of  By: Valinda Party RN, Nancy     Depression    Diabetes mellitus without complication (HCC)    DYSPNEA 01/26/2009   Qualifier: Diagnosis of  By: Valinda Party RN, Harriett Sine     Dysthymic disorder 01/26/2009   Qualifier: Diagnosis of  By: Valinda Party RN, Nancy     Elevated alkaline phosphatase level 11/27/2020   Epididymoorchitis 12/31/2020   Essential (primary) hypertension 08/23/2015   Family history of adverse reaction to anesthesia    "it made my  son sick"   Gangrene of toe of right foot (HCC) 11/11/2022   Generalized abdominal pain 08/23/2015   GERD (gastroesophageal reflux disease)    History of arthroplasty of right knee    Hyperlipemia    Iron deficiency anemia due to chronic blood loss 11/07/2020   Malaise and fatigue 06/28/2020   Metatarsal deformity 11/01/2013   Mild intermittent asthma without complication    Mixed hyperlipidemia    Moderate episode of recurrent major depressive disorder (HCC)    Morbid obesity (HCC) 08/23/2015   Myocardial infarction (HCC)    2012; hospitalized [facility] for chest pain syndrome 02/2011 and [facility] showed trivial CAD   Neuropathy    Noncompliance 05/30/2021   Pain in lower limb 12/07/2013   Plantar fasciitis of left foot 11/01/2013   Pneumonia 08/2016   surgery had to be rescheduled due to pneumonia   Primary insomnia 12/19/2021   Formatting of this note might be different from the original.  Avoid Rx   Primary osteoarthritis involving multiple joints 11/01/2020   Formatting of this note might be different from the original. both knees replaced.   Primary osteoarthritis of left knee 09/06/2016   Primary osteoarthritis of right knee 09/30/2015   Psoriasis    Sepsis, unspecified organism (HCC) 10/20/2015   Skin ulcer of right great toe (HCC) 01/07/2022   Status post partial colectomy 10/04/2022   Type 2 diabetes mellitus with diabetic polyneuropathy, with long-term current use of insulin (HCC) 04/03/2022   Type 2 diabetes mellitus without complications (HCC) 08/23/2015   Uncomplicated opioid dependence (HCC)    Vitamin B12 deficiency 11/02/2020    Family History  Problem Relation Age of Onset   Cancer Mother    Diabetes Mother    Hyperlipidemia Father    Hypertension Father    Heart failure Father    Heart failure Brother     Past Surgical History:  Procedure Laterality Date   AMPUTATION Right 11/15/2022   Procedure: RIGHT TRANSMETATARSAL AMPUTATION;  Surgeon: Nadara Mustard, MD;   Location: [facility] OR;  Service: Orthopedics;  Laterality: Right;   AMPUTATION Right 12/20/2022   Procedure: RIGHT CHOPART AMPUTATION;  Surgeon: Nadara Mustard, MD;  Location:  [facility] OR;  Service: Orthopedics;  Laterality: Right;   AMPUTATION Right 01/15/2023   Procedure: RIGHT BELOW KNEE AMPUTATION;  Surgeon: Nadara Mustard, MD;  Location: [facility] OR;  Service: Orthopedics;  Laterality: Right;   CARDIAC CATHETERIZATION     03/28/11 LHC: NL LM, LAD; 10% mCX, mRCA. EF 65%. (Dr. Dot Been, [facility])   CHOLECYSTECTOMY     CORONARY ANGIOPLASTY     2012 [facility]    ESOPHAGEAL DILATION     SEVERAL TIMES   Heel Spur Resection with Fasiotomy Left 11/25/2013   @ [facility]   KNEE ARTHROSCOPY Bilateral    SHOULDER ARTHROSCOPY WITH OPEN ROTATOR CUFF REPAIR Right    TOTAL KNEE ARTHROPLASTY Right 10/04/2015   Procedure: RIGHT TOTAL KNEE ARTHROPLASTY;  Surgeon: Gean Birchwood, MD;  Location: [facility] OR;  Service: Orthopedics;  Laterality: Right;   TOTAL KNEE ARTHROPLASTY Left 10/14/2016   TOTAL KNEE ARTHROPLASTY Left 10/14/2016   Procedure: LEFT TOTAL KNEE ARTHROPLASTY;  Surgeon: Gean Birchwood, MD;  Location: [facility] OR;  Service: Orthopedics;  Laterality: Left;   TUMOR REMOVAL     FATTY TUMOR  RT SHOULDER   Social History   Occupational History   Not on file  Tobacco Use   Smoking status: Never   Smokeless tobacco: Never  Vaping Use   Vaping status: Never Used  Substance and Sexual Activity   Alcohol use: No   Drug use: No   Sexual activity: Not on file

## 2023-04-08 ENCOUNTER — Encounter: Payer: Medicare Other | Admitting: Family

## 2023-04-16 ENCOUNTER — Telehealth: Payer: Self-pay | Admitting: Orthopedic Surgery

## 2023-04-16 NOTE — Telephone Encounter (Signed)
Patient called and said that his foot was a burning feeling in the toes. CB#[phone]

## 2023-04-17 NOTE — Telephone Encounter (Signed)
SW pt, he is having phantom pain in amputated leg. Says his "foot and toes" are burning. He is taking gabapentin 300mg  QID already prescribed from his PCP. Anything else he can do for this pain?

## 2023-04-18 NOTE — Telephone Encounter (Signed)
Pt informed

## 2023-04-28 ENCOUNTER — Encounter: Payer: Medicare Other | Admitting: Physical Therapy

## 2023-04-28 NOTE — Therapy (Deleted)
OUTPATIENT PHYSICAL THERAPY PROSTHETIC EVALUATION   Patient Name: Kyle [name] MRN: [id number] DOB:[birth year], 62 y.o., male Today's Date: 04/28/2023  PCP: Gordan Payment, MD REFERRING PROVIDER: Aldean Baker, MD  END OF SESSION:   Past Medical History:  Diagnosis Date   AKI (acute kidney injury) (HCC) 11/11/2022   01/14/23,  numbers improved   Anemia of unknown etiology    Anxiety disorder    Arthritis    ATHEROSLERO NATV ART EXTREM W/INTERMIT CLAUDICAT 01/26/2009   Qualifier: Diagnosis of  By: Valinda Party RN, Nancy     Back pain of lumbar region with sciatica 01/31/2021   Benign prostatic hyperplasia with urinary frequency    CHEST PAIN UNSPECIFIED 01/26/2009   Qualifier: Diagnosis of  By: Valinda Party RN, Harriett Sine     Class 2 severe obesity due to excess calories with serious comorbidity and body mass index (BMI) of 38.0 to 38.9 in adult [facility])    CLAUDICATION 01/26/2009   Qualifier: Diagnosis of  By: Valinda Party RN, Nancy     Colonic mass 03/04/2022   Coronary artery disease    minimal CAD '12; NL stress test 06/10/14 ([facility])   Coronary artery disease of native artery of native heart with stable angina pectoris (HCC)    Degeneration of intervertebral disc of lumbosacral region 08/23/2015   Degenerative disc disease, lumbar    Degenerative lumbar disc 11/01/2020   Formatting of this note might be different from the original. Was recommended back surgery. Holding out.   Dehydration 01/26/2009   Qualifier: Diagnosis of  By: Valinda Party RN, Nancy     Depression    Diabetes mellitus without complication (HCC)    DYSPNEA 01/26/2009   Qualifier: Diagnosis of  By: Valinda Party RN, Harriett Sine     Dysthymic disorder 01/26/2009   Qualifier: Diagnosis of  By: Valinda Party RN, Nancy     Elevated alkaline phosphatase level 11/27/2020   Epididymoorchitis 12/31/2020   Essential (primary) hypertension 08/23/2015   Family history of adverse reaction to anesthesia    "it made my son sick"   Gangrene of toe of right  foot (HCC) 11/11/2022   Generalized abdominal pain 08/23/2015   GERD (gastroesophageal reflux disease)    History of arthroplasty of right knee    Hyperlipemia    Iron deficiency anemia due to chronic blood loss 11/07/2020   Malaise and fatigue 06/28/2020   Metatarsal deformity 11/01/2013   Mild intermittent asthma without complication    Mixed hyperlipidemia    Moderate episode of recurrent major depressive disorder (HCC)    Morbid obesity (HCC) 08/23/2015   Myocardial infarction (HCC)    2012; hospitalized [facility] for chest pain syndrome 02/2011 and LHC showed trivial CAD   Neuropathy    Noncompliance 05/30/2021   Pain in lower limb 12/07/2013   Plantar fasciitis of left foot 11/01/2013   Pneumonia 08/2016   surgery had to be rescheduled due to pneumonia   Primary insomnia 12/19/2021   Formatting of this note might be different from the original.  Avoid Rx   Primary osteoarthritis involving multiple joints 11/01/2020   Formatting of this note might be different from the original. both knees replaced.   Primary osteoarthritis of left knee 09/06/2016   Primary osteoarthritis of right knee 09/30/2015   Psoriasis    Sepsis, unspecified organism (HCC) 10/20/2015   Skin ulcer of right great toe (HCC) 01/07/2022   Status post partial colectomy 10/04/2022   Type 2 diabetes mellitus with diabetic polyneuropathy, with long-term current use of insulin (HCC) 04/03/2022  Type 2 diabetes mellitus without complications (HCC) 08/23/2015   Uncomplicated opioid dependence (HCC)    Vitamin B12 deficiency 11/02/2020   Past Surgical History:  Procedure Laterality Date   AMPUTATION Right 11/15/2022   Procedure: RIGHT TRANSMETATARSAL AMPUTATION;  Surgeon: Nadara Mustard, MD;  Location: [facility] OR;  Service: Orthopedics;  Laterality: Right;   AMPUTATION Right 12/20/2022   Procedure: RIGHT CHOPART AMPUTATION;  Surgeon: Nadara Mustard, MD;  Location: [facility] OR;  Service: Orthopedics;  Laterality: Right;    AMPUTATION Right 01/15/2023   Procedure: RIGHT BELOW KNEE AMPUTATION;  Surgeon: Nadara Mustard, MD;  Location: [facility] OR;  Service: Orthopedics;  Laterality: Right;   CARDIAC CATHETERIZATION     03/28/11 LHC: NL LM, LAD; 10% mCX, mRCA. EF 65%. (Dr. Dot Been, [facility])   CHOLECYSTECTOMY     CORONARY ANGIOPLASTY     2012 [facility]    ESOPHAGEAL DILATION     SEVERAL TIMES   Heel Spur Resection with Fasiotomy Left 11/25/2013   @ [facility]   KNEE ARTHROSCOPY Bilateral    SHOULDER ARTHROSCOPY WITH OPEN ROTATOR CUFF REPAIR Right    TOTAL KNEE ARTHROPLASTY Right 10/04/2015   Procedure: RIGHT TOTAL KNEE ARTHROPLASTY;  Surgeon: Gean Birchwood, MD;  Location: [facility] OR;  Service: Orthopedics;  Laterality: Right;   TOTAL KNEE ARTHROPLASTY Left 10/14/2016   TOTAL KNEE ARTHROPLASTY Left 10/14/2016   Procedure: LEFT TOTAL KNEE ARTHROPLASTY;  Surgeon: Gean Birchwood, MD;  Location: [facility] OR;  Service: Orthopedics;  Laterality: Left;   TUMOR REMOVAL     FATTY TUMOR  RT SHOULDER   Patient Active Problem List   Diagnosis Date Noted   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   Dehiscence of amputation stump of right lower extremity (HCC) 12/20/2022   Gangrene of toe of right foot (HCC) 11/11/2022   AKI (acute kidney injury) (HCC) 11/11/2022   Status post partial colectomy 10/04/2022   Type 2 diabetes mellitus with hyperglycemia, with long-term current use of insulin (HCC) 04/03/2022   Type 2 diabetes mellitus with diabetic polyneuropathy, with long-term current use of insulin (HCC) 04/03/2022   Diabetes mellitus (HCC) 04/03/2022   Colonic mass 03/04/2022   Skin ulcer of right great toe (HCC) 01/07/2022   Primary insomnia 12/19/2021   Noncompliance 05/30/2021   Back pain of lumbar region with sciatica 01/31/2021   Epididymoorchitis 12/31/2020   Anemia of unknown etiology    Arthritis    Coronary artery disease    Degenerative disc disease, lumbar    Depression    Diabetes mellitus without  complication (HCC)    GERD (gastroesophageal reflux disease)    Family history of adverse reaction to anesthesia    Hyperlipemia    Myocardial infarction (HCC)    Elevated alkaline phosphatase level 11/27/2020   Iron deficiency anemia due to chronic blood loss 11/07/2020   Vitamin B12 deficiency 11/02/2020   Anxiety disorder 11/01/2020   Benign prostatic hyperplasia with urinary frequency 11/01/2020   Coronary artery disease of native artery of native heart with stable angina pectoris (HCC) 11/01/2020   Mild intermittent asthma without complication 11/01/2020   Moderate episode of recurrent major depressive disorder (HCC) 11/01/2020   Psoriasis 11/01/2020   Uncomplicated opioid dependence (HCC) 11/01/2020   Primary osteoarthritis involving multiple joints 11/01/2020   Degenerative lumbar disc 11/01/2020   Mixed hyperlipidemia 06/28/2020   Malaise and fatigue 06/28/2020   Pneumonia 08/2016   History of arthroplasty of right knee 10/20/2015   Sepsis, unspecified organism (HCC) 10/20/2015  Primary osteoarthritis of right knee 09/30/2015   Morbid obesity (HCC) 08/23/2015   Essential (primary) hypertension 08/23/2015   Degeneration of intervertebral disc of lumbosacral region 08/23/2015   Generalized abdominal pain 08/23/2015   Class 2 severe obesity due to excess calories with serious comorbidity and body mass index (BMI) of 38.0 to 38.9 in adult [facility]) 08/23/2015   Type 2 diabetes mellitus without complications (HCC) 08/23/2015   Pain in lower limb 12/07/2013   Plantar fasciitis of left foot 11/01/2013   Metatarsal deformity 11/01/2013   Diabetes (HCC) 11/01/2013   Type 2 diabetes mellitus without complication, with long-term current use of insulin (HCC) 11/01/2013   DEHYDRATION 01/26/2009   DYSTHYMIC DISORDER 01/26/2009   ATHEROSLERO NATV ART EXTREM W/INTERMIT CLAUDICAT 01/26/2009   CLAUDICATION 01/26/2009   DYSPNEA 01/26/2009   CHEST PAIN UNSPECIFIED 01/26/2009    ONSET DATE:  ***  REFERRING DIAG: U98.119J (ICD-10-CM) - Below-knee amputation of right lower extremity, sequela   THERAPY DIAG:  No diagnosis found.  Rationale for Evaluation and Treatment: Rehabilitation  SUBJECTIVE:   SUBJECTIVE STATEMENT: This 62yo male underwent a right Transtibial Amputation on 01/15/2023 due to nonhealing Chopart Amputation 5/24/12024 & Transmetatarsal Amputation 11/15/2022.   Pt accompanied by: {accompnied:27141}  PERTINENT HISTORY: right TTA 01/15/23, left TKA 2018, Right TKA 2017, arthritis, LBP, obesity, CAD, DM, polyneuropathy,  HTN, depression,   PAIN:  Are you having pain? {OPRCPAIN:27236}  PRECAUTIONS: {Therapy precautions:24002}  WEIGHT BEARING RESTRICTIONS: {Yes ***/No:24003}  FALLS: Has patient fallen in last 6 months? {fallsyesno:27318}  LIVING ENVIRONMENT: Lives with: {OPRC lives with:25569::"lives with their family"} Lives in: {Lives in:25570} Home Access: {HOMEACCESS:26869} Home layout: {Home Layout:26870} Stairs: {opstairs:27293} Has following equipment at home: {Assistive devices:23999}  OCCUPATION: ***  PLOF: {PLOF:24004}  PATIENT GOALS: ***  OBJECTIVE:  DIAGNOSTIC FINDINGS: ***  COGNITION: Overall cognitive status: {cognition:24006}   SENSATION: {sensation:27233}  MUSCLE LENGTH: Hamstrings: Right *** deg; Left *** deg Thomas test: Right *** deg; Left *** deg  DTRs:  {DTR SITE:24025}  POSTURE: {posture:25561}  LOWER EXTREMITY ROM:  ROM P:passive  A:active Right eval Left eval  Hip flexion ***   Hip extension    Hip abduction    Hip adduction    Hip internal rotation    Hip external rotation    Knee flexion    Knee extension    Ankle dorsiflexion    Ankle plantarflexion    Ankle inversion    Ankle eversion     (Blank rows = not tested)  LOWER EXTREMITY MMT:  MMT Right eval Left eval  Hip flexion ***   Hip extension    Hip abduction    Hip adduction    Hip internal rotation    Hip external rotation     Knee flexion    Knee extension    Ankle dorsiflexion    Ankle plantarflexion    Ankle inversion    Ankle eversion    At Evaluation all strength testing is grossly seated and functionally standing / gait. (Blank rows = not tested)  BED MOBILITY:  {Bed mobility:24027}  TRANSFERS: {transfers:26872}  FUNCTIONAL TESTs:  {Functional tests:24029}  GAIT: Gait pattern: {gait characteristics:25376} Distance walked: *** Assistive device utilized: {Assistive devices:23999} Level of assistance: {Levels of assistance:24026} Gait velocity: *** {uom:26873} Comments: ***  RAMP {Levels of assistance:24026}  CURB: {Levels of assistance:24026}  CARDIOVASCULAR RESPONSE: Functional activity: *** Pre-activity vitals: BP: *** HR: *** SpO2: *** Post-activity vitals: BP: *** HR: *** SpO2: *** Modified Borg scale for dyspnea: {Modified Borg scale:26876}  CURRENT  PROSTHETIC WEAR ASSESSMENT: ***  Patient is independent with: {prosthetic WUJW:11914} Patient is dependent with: {prosthetic care:26874} Donning prosthesis: {Levels of assistance:24026} Doffing prosthesis: {Levels of assistance:24026} Prosthetic wear tolerance: *** hours, ***x/day, *** days/week Prosthetic weight bearing tolerance: *** minutes Edema: *** Residual limb condition: *** Prosthetic description: *** K code/activity level with prosthetic use: {K-levels:26875}    TODAY'S TREATMENT:                                                                                                                             DATE:  ***  PATIENT EDUCATION: PATIENT EDUCATED ON FOLLOWING PROSTHETIC CARE: Education details: ***  {PTprostheticeducation:27015} Prosthetic wear tolerance: *** hours ***/day, *** days/week Person educated: {Person educated:25204} Education method: {Education Method:25205} Education comprehension: {Education Comprehension:25206}  HOME EXERCISE PROGRAM:  ASSESSMENT:  CLINICAL IMPRESSION: Patient is a  *** y.o. ***male who was seen today for physical therapy evaluation and treatment for prosthetic training with ***. ***  OBJECTIVE IMPAIRMENTS: {opptimpairments:25111}.   ACTIVITY LIMITATIONS: {activitylimitations:27494}  PARTICIPATION LIMITATIONS: {participationrestrictions:25113}  PERSONAL FACTORS: {Personal factors:25162} are also affecting patient's functional outcome.   REHAB POTENTIAL: {rehabpotential:25112}  CLINICAL DECISION MAKING: {clinical decision making:25114}  EVALUATION COMPLEXITY: {Evaluation complexity:25115}   GOALS: Goals reviewed with patient? Yes  SHORT TERM GOALS: Target date: ***  Patient donnes prosthesis modified independent & verbalizes proper cleaning. Baseline: SEE OBJECTIVE DATA Goal status: INITIAL 2.  Patient tolerates prosthesis >10 hrs total /day without skin issues or limb pain >5/10 after standing. Baseline: SEE OBJECTIVE DATA Goal status: INITIAL  3.  Patient able to reach 7" and look over both shoulders without UE support with supervision. Baseline: SEE OBJECTIVE DATA Goal status: INITIAL  4. Patient ambulates 55' with RW & prosthesis with supervision. Baseline: SEE OBJECTIVE DATA Goal status: INITIAL  5. Patient negotiates ramps & curbs with RW & prosthesis with minA. Baseline: SEE OBJECTIVE DATA Goal status: INITIAL  LONG TERM GOALS: Target date: ***  Patient demonstrates & verbalized understanding of prosthetic care to enable safe utilization of prosthesis. Baseline: SEE OBJECTIVE DATA Goal status: INITIAL  Patient tolerates prosthesis wear >90% of awake hours without skin or limb pain issues. Baseline: SEE OBJECTIVE DATA Goal status: INITIAL  Functional Gait Assessment >/= 19/30 to indicate lower fall risk Baseline: SEE OBJECTIVE DATA Goal status: INITIAL  Patient ambulates >500' with prosthesis only independently Baseline: SEE OBJECTIVE DATA Goal status: INITIAL  Patient negotiates ramps, curbs & stairs with single  rail with prosthesis only independently. Baseline: SEE OBJECTIVE DATA Goal status: INITIAL  Patient demonstrates & verbalizes lifting, carrying, pushing, pulling with prosthesis only safely. Baseline: SEE OBJECTIVE DATA Goal status: INITIAL  Patient verbalizes & demonstrates understanding of how to properly use fitness equipment to return to gym. Baseline: SEE OBJECTIVE DATA Goal status: INITIAL  PLAN:  PT FREQUENCY: {rehab frequency:25116}  PT DURATION: {rehab duration:25117}  PLANNED INTERVENTIONS: {rehab planned interventions:25118::"Therapeutic exercises","Therapeutic activity","Neuromuscular re-education","Balance training","Gait training","Patient/Family education","Self Care","Joint mobilization"}  PLAN FOR NEXT SESSION: ***  Vladimir Faster, PT, DPT 04/28/2023, 8:11 AM

## 2023-05-14 ENCOUNTER — Telehealth: Payer: Self-pay | Admitting: Orthopedic Surgery

## 2023-05-14 NOTE — Telephone Encounter (Signed)
Patient called needed to know the authorization is complete or not for durable equipment. [id number]#[phone]

## 2023-05-15 NOTE — Telephone Encounter (Signed)
I called the pt and he said that he did not call the office, he does not need any DME and did not know what I was talking about. I apologized for the confusion and advised to call if he needs anything.

## 2023-05-26 ENCOUNTER — Encounter: Payer: Self-pay | Admitting: Orthopedic Surgery

## 2023-05-26 ENCOUNTER — Ambulatory Visit (INDEPENDENT_AMBULATORY_CARE_PROVIDER_SITE_OTHER): Payer: Medicare Other | Admitting: Orthopedic Surgery

## 2023-05-26 DIAGNOSIS — Z89511 Acquired absence of right leg below knee: Secondary | ICD-10-CM

## 2023-05-26 DIAGNOSIS — L03032 Cellulitis of left toe: Secondary | ICD-10-CM

## 2023-05-26 DIAGNOSIS — S88111S Complete traumatic amputation at level between knee and ankle, right lower leg, sequela: Secondary | ICD-10-CM

## 2023-05-26 MED ORDER — DOXYCYCLINE HYCLATE 100 MG PO TABS: 100.0000 mg | ORAL_TABLET | Freq: Two times a day (BID) | ORAL | 0 refills | Status: DC

## 2023-05-26 MED ORDER — MUPIROCIN 2 % EX OINT: 1.0000 | TOPICAL_OINTMENT | Freq: Two times a day (BID) | CUTANEOUS | 3 refills | Status: DC

## 2023-05-26 NOTE — Progress Notes (Signed)
Office Visit Note   Patient: Kyle [name]           Date of Birth: [birth year]           MRN: [id number] Visit Date: 05/26/2023              Requested by: [name], MD [address] [place],  [place] [address] PCP: [name], MD  Chief Complaint  Patient presents with   Right Leg - Wound Check    Hx right BKA      HPI: Patient is a 62 year old gentleman status post right below-knee amputation who presents with acute cellulitis and ulceration left forefoot.  Patient states it started on Friday.  Patient states this was secondary to tight shoe wear.  Assessment & Plan: Visit Diagnoses:  1. Below-knee amputation of right lower extremity, sequela (HCC)   2. Cellulitis of left toe     Plan: Prescription is sent in for doxycycline patient will start Bactroban dressing changes daily place him in a postoperative shoe.  Follow-Up Instructions: Return in about 1 week (around 06/02/2023).   Ortho Exam  Patient is alert, oriented, no adenopathy, well-dressed, normal affect, normal respiratory effort. Examination patient has a strong palpable dorsalis pedis pulse.  He has cellulitis across the forefoot and has circumferential partial-thickness ulceration to the left great toe as well as ulceration to the left second toe.  Imaging: No results found. No images are attached to the encounter.  Labs: Lab Results  Component Value Date   HGBA1C 7.0 (H) 11/12/2022   HGBA1C 9.2 (H) 10/14/2016   HGBA1C 8.8 (H) 09/03/2016   ESRSEDRATE 79 (H) 11/11/2022   CRP 18.4 (H) 11/11/2022   REPTSTATUS 11/16/2022 FINAL 11/11/2022   CULT  11/11/2022    NO GROWTH 5 DAYS Performed at [facility], [address], [place], [place] [address]      Lab Results  Component Value Date   ALBUMIN 3.4 (L) 11/11/2022    Lab Results  Component Value Date   MG 1.8 11/15/2022   MG 1.6 (L) 11/14/2022   No results found for: "VD25OH"  No results found for: "PREALBUMIN"    Latest Ref  Rng & Units 01/15/2023   10:02 AM 11/13/2022    1:06 AM 11/12/2022    2:53 AM  CBC EXTENDED  WBC 4.0 - 10.5 K/uL 10.0  8.4  11.1   RBC 4.22 - 5.81 MIL/uL 3.84  3.72  3.39   Hemoglobin 13.0 - 17.0 g/dL 01.0  93.2  9.2   HCT 35.5 - 52.0 % 33.5  32.1  29.3   Platelets 150 - 400 K/uL 462  320  294   NEUT# 1.7 - 7.7 K/uL  4.7    Lymph# 0.7 - 4.0 K/uL  2.4       There is no height or weight on file to calculate BMI.  Orders:  No orders of the defined types were placed in this encounter.  No orders of the defined types were placed in this encounter.    Procedures: No procedures performed  Clinical Data: No additional findings.  ROS:  All other systems negative, except as noted in the HPI. Review of Systems  Objective: Vital Signs: There were no vitals taken for this visit.  Specialty Comments:  No specialty comments available.  PMFS History: Patient Active Problem List   Diagnosis Date Noted   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   Dehiscence of amputation stump of  right lower extremity (HCC) 12/20/2022   Gangrene of toe of right foot (HCC) 11/11/2022   AKI (acute kidney injury) (HCC) 11/11/2022   Status post partial colectomy 10/04/2022   Type 2 diabetes mellitus with hyperglycemia, with long-term current use of insulin (HCC) 04/03/2022   Type 2 diabetes mellitus with diabetic polyneuropathy, with long-term current use of insulin (HCC) 04/03/2022   Diabetes mellitus (HCC) 04/03/2022   Colonic mass 03/04/2022   Skin ulcer of right great toe (HCC) 01/07/2022   Primary insomnia 12/19/2021   Noncompliance 05/30/2021   Back pain of lumbar region with sciatica 01/31/2021   Epididymoorchitis 12/31/2020   Anemia of unknown etiology    Arthritis    Coronary artery disease    Degenerative disc disease, lumbar    Depression    Diabetes mellitus without complication (HCC)    GERD (gastroesophageal reflux disease)    Family history of adverse  reaction to anesthesia    Hyperlipemia    Myocardial infarction (HCC)    Elevated alkaline phosphatase level 11/27/2020   Iron deficiency anemia due to chronic blood loss 11/07/2020   Vitamin B12 deficiency 11/02/2020   Anxiety disorder 11/01/2020   Benign prostatic hyperplasia with urinary frequency 11/01/2020   Coronary artery disease of native artery of native heart with stable angina pectoris (HCC) 11/01/2020   Mild intermittent asthma without complication 11/01/2020   Moderate episode of recurrent major depressive disorder (HCC) 11/01/2020   Psoriasis 11/01/2020   Uncomplicated opioid dependence (HCC) 11/01/2020   Primary osteoarthritis involving multiple joints 11/01/2020   Degenerative lumbar disc 11/01/2020   Mixed hyperlipidemia 06/28/2020   Malaise and fatigue 06/28/2020   Pneumonia 08/2016   History of arthroplasty of right knee 10/20/2015   Sepsis, unspecified organism (HCC) 10/20/2015   Primary osteoarthritis of right knee 09/30/2015   Morbid obesity (HCC) 08/23/2015   Essential (primary) hypertension 08/23/2015   Degeneration of intervertebral disc of lumbosacral region 08/23/2015   Generalized abdominal pain 08/23/2015   Class 2 severe obesity due to excess calories with serious comorbidity and body mass index (BMI) of 38.0 to 38.9 in adult [facility]) 08/23/2015   Type 2 diabetes mellitus without complications (HCC) 08/23/2015   Pain in lower limb 12/07/2013   Plantar fasciitis of left foot 11/01/2013   Metatarsal deformity 11/01/2013   Diabetes (HCC) 11/01/2013   Type 2 diabetes mellitus without complication, with long-term current use of insulin (HCC) 11/01/2013   DEHYDRATION 01/26/2009   DYSTHYMIC DISORDER 01/26/2009   Atherosclerosis of native artery of extremity with intermittent claudication (HCC) 01/26/2009   CLAUDICATION 01/26/2009   DYSPNEA 01/26/2009   CHEST PAIN UNSPECIFIED 01/26/2009   Past Medical History:  Diagnosis Date   AKI (acute kidney injury)  (HCC) 11/11/2022   01/14/23,  numbers improved   Anemia of unknown etiology    Anxiety disorder    Arthritis    ATHEROSLERO NATV ART EXTREM W/INTERMIT CLAUDICAT 01/26/2009   Qualifier: Diagnosis of  By: Valinda Party RN, Nancy     Back pain of lumbar region with sciatica 01/31/2021   Benign prostatic hyperplasia with urinary frequency    CHEST PAIN UNSPECIFIED 01/26/2009   Qualifier: Diagnosis of  By: Valinda Party RN, Harriett Sine     Class 2 severe obesity due to excess calories with serious comorbidity and body mass index (BMI) of 38.0 to 38.9 in adult [facility])    CLAUDICATION 01/26/2009   Qualifier: Diagnosis of  By: Valinda Party RN, Harriett Sine     Colonic mass 03/04/2022   Coronary artery disease  minimal CAD '12; NL stress test 06/10/14 ([facility])   Coronary artery disease of native artery of native heart with stable angina pectoris (HCC)    Degeneration of intervertebral disc of lumbosacral region 08/23/2015   Degenerative disc disease, lumbar    Degenerative lumbar disc 11/01/2020   Formatting of this note might be different from the original. Was recommended back surgery. Holding out.   Dehydration 01/26/2009   Qualifier: Diagnosis of  By: Valinda Party RN, Nancy     Depression    Diabetes mellitus without complication (HCC)    DYSPNEA 01/26/2009   Qualifier: Diagnosis of  By: Valinda Party RN, Harriett Sine     Dysthymic disorder 01/26/2009   Qualifier: Diagnosis of  By: Valinda Party RN, Nancy     Elevated alkaline phosphatase level 11/27/2020   Epididymoorchitis 12/31/2020   Essential (primary) hypertension 08/23/2015   Family history of adverse reaction to anesthesia    "it made my son sick"   Gangrene of toe of right foot (HCC) 11/11/2022   Generalized abdominal pain 08/23/2015   GERD (gastroesophageal reflux disease)    History of arthroplasty of right knee    Hyperlipemia    Iron deficiency anemia due to chronic blood loss 11/07/2020   Malaise and fatigue 06/28/2020   Metatarsal deformity 11/01/2013   Mild  intermittent asthma without complication    Mixed hyperlipidemia    Moderate episode of recurrent major depressive disorder (HCC)    Morbid obesity (HCC) 08/23/2015   Myocardial infarction (HCC)    2012; hospitalized [facility] for chest pain syndrome 02/2011 and LHC showed trivial CAD   Neuropathy    Noncompliance 05/30/2021   Pain in lower limb 12/07/2013   Plantar fasciitis of left foot 11/01/2013   Pneumonia 08/2016   surgery had to be rescheduled due to pneumonia   Primary insomnia 12/19/2021   Formatting of this note might be different from the original.  Avoid Rx   Primary osteoarthritis involving multiple joints 11/01/2020   Formatting of this note might be different from the original. both knees replaced.   Primary osteoarthritis of left knee 09/06/2016   Primary osteoarthritis of right knee 09/30/2015   Psoriasis    Sepsis, unspecified organism (HCC) 10/20/2015   Skin ulcer of right great toe (HCC) 01/07/2022   Status post partial colectomy 10/04/2022   Type 2 diabetes mellitus with diabetic polyneuropathy, with long-term current use of insulin (HCC) 04/03/2022   Type 2 diabetes mellitus without complications (HCC) 08/23/2015   Uncomplicated opioid dependence (HCC)    Vitamin B12 deficiency 11/02/2020    Family History  Problem Relation Age of Onset   Cancer Mother    Diabetes Mother    Hyperlipidemia Father    Hypertension Father    Heart failure Father    Heart failure Brother     Past Surgical History:  Procedure Laterality Date   AMPUTATION Right 11/15/2022   Procedure: RIGHT TRANSMETATARSAL AMPUTATION;  Surgeon: Nadara Mustard, MD;  Location: [facility] OR;  Service: Orthopedics;  Laterality: Right;   AMPUTATION Right 12/20/2022   Procedure: RIGHT CHOPART AMPUTATION;  Surgeon: Nadara Mustard, MD;  Location: [facility] OR;  Service: Orthopedics;  Laterality: Right;   AMPUTATION Right 01/15/2023   Procedure: RIGHT BELOW KNEE AMPUTATION;  Surgeon: Nadara Mustard, MD;  Location: [facility] OR;   Service: Orthopedics;  Laterality: Right;   CARDIAC CATHETERIZATION     03/28/11 LHC: NL LM, LAD; 10% mCX, mRCA. EF 65%. (Dr. Dot Been, [facility])   CHOLECYSTECTOMY     CORONARY  ANGIOPLASTY     [facility]    ESOPHAGEAL DILATION     SEVERAL TIMES   Heel Spur Resection with Fasiotomy Left 11/25/2013   @ [facility]   KNEE ARTHROSCOPY Bilateral    SHOULDER ARTHROSCOPY WITH OPEN ROTATOR CUFF REPAIR Right    TOTAL KNEE ARTHROPLASTY Right 10/04/2015   Procedure: RIGHT TOTAL KNEE ARTHROPLASTY;  Surgeon: Gean Birchwood, MD;  Location: [facility] OR;  Service: Orthopedics;  Laterality: Right;   TOTAL KNEE ARTHROPLASTY Left 10/14/2016   TOTAL KNEE ARTHROPLASTY Left 10/14/2016   Procedure: LEFT TOTAL KNEE ARTHROPLASTY;  Surgeon: Gean Birchwood, MD;  Location: [facility] OR;  Service: Orthopedics;  Laterality: Left;   TUMOR REMOVAL     FATTY TUMOR  RT SHOULDER   Social History   Occupational History   Not on file  Tobacco Use   Smoking status: Never   Smokeless tobacco: Never  Vaping Use   Vaping status: Never Used  Substance and Sexual Activity   Alcohol use: No   Drug use: No   Sexual activity: Not on file

## 2023-05-27 ENCOUNTER — Other Ambulatory Visit: Payer: Self-pay | Admitting: Podiatry

## 2023-05-28 ENCOUNTER — Encounter: Payer: Medicare Other | Admitting: Physical Therapy

## 2023-05-29 ENCOUNTER — Encounter: Payer: Medicare Other | Admitting: Physical Therapy

## 2023-06-02 ENCOUNTER — Ambulatory Visit: Payer: Medicare Other | Admitting: Orthopedic Surgery

## 2023-06-03 ENCOUNTER — Ambulatory Visit (INDEPENDENT_AMBULATORY_CARE_PROVIDER_SITE_OTHER): Payer: Medicare Other | Admitting: Orthopedic Surgery

## 2023-06-03 DIAGNOSIS — Z89511 Acquired absence of right leg below knee: Secondary | ICD-10-CM

## 2023-06-03 DIAGNOSIS — L03032 Cellulitis of left toe: Secondary | ICD-10-CM

## 2023-06-03 DIAGNOSIS — S88111S Complete traumatic amputation at level between knee and ankle, right lower leg, sequela: Secondary | ICD-10-CM

## 2023-06-04 ENCOUNTER — Encounter: Payer: Self-pay | Admitting: Orthopedic Surgery

## 2023-06-04 ENCOUNTER — Encounter: Payer: Medicare Other | Admitting: Physical Therapy

## 2023-06-04 NOTE — Progress Notes (Signed)
Office Visit Note   Patient: Kyle [name]           Date of Birth: [birth year]           MRN: [id number] Visit Date: 06/03/2023              Requested by: [name], MD [address] [place],  [place] [address] PCP: [name], MD  Chief Complaint  Patient presents with   Right Leg - Follow-up    01/15/2023 right BKA    Left Foot - Follow-up      HPI: Patient is a 62 year old gentleman who presents in follow-up for cellulitis left foot and transtibial amputation of the right.  Patient states he picked up his prosthesis 2 weeks ago.  Assessment & Plan: Visit Diagnoses:  1. Below-knee amputation of right lower extremity, sequela (HCC)   2. Cellulitis of left toe     Plan: Patient will continue Bactroban dressing changes continue with his postoperative shoe.  Follow-Up Instructions: Return in about 2 weeks (around 06/17/2023).   Ortho Exam  Patient is alert, oriented, no adenopathy, well-dressed, normal affect, normal respiratory effort. Examination patient has a well consolidated right transtibial amputation left foot has a good dorsalis pedis pulse.  The great toe ulcer shows improvement and is 3 x 5 cm and superficial.  No ascending cellulitis.  Imaging: No results found. No images are attached to the encounter.  Labs: Lab Results  Component Value Date   HGBA1C 7.0 (H) 11/12/2022   HGBA1C 9.2 (H) 10/14/2016   HGBA1C 8.8 (H) 09/03/2016   ESRSEDRATE 79 (H) 11/11/2022   CRP 18.4 (H) 11/11/2022   REPTSTATUS 11/16/2022 FINAL 11/11/2022   CULT  11/11/2022    NO GROWTH 5 DAYS Performed at [facility], [address], [place], [place] [address]      Lab Results  Component Value Date   ALBUMIN 3.4 (L) 11/11/2022    Lab Results  Component Value Date   MG 1.8 11/15/2022   MG 1.6 (L) 11/14/2022   No results found for: "VD25OH"  No results found for: "PREALBUMIN"    Latest Ref Rng & Units 01/15/2023   10:02 AM 11/13/2022    1:06 AM 11/12/2022     2:53 AM  CBC EXTENDED  WBC 4.0 - 10.5 K/uL 10.0  8.4  11.1   RBC 4.22 - 5.81 MIL/uL 3.84  3.72  3.39   Hemoglobin 13.0 - 17.0 g/dL 08.6  57.8  9.2   HCT 46.9 - 52.0 % 33.5  32.1  29.3   Platelets 150 - 400 K/uL 462  320  294   NEUT# 1.7 - 7.7 K/uL  4.7    Lymph# 0.7 - 4.0 K/uL  2.4       There is no height or weight on file to calculate BMI.  Orders:  No orders of the defined types were placed in this encounter.  No orders of the defined types were placed in this encounter.    Procedures: No procedures performed  Clinical Data: No additional findings.  ROS:  All other systems negative, except as noted in the HPI. Review of Systems  Objective: Vital Signs: There were no vitals taken for this visit.  Specialty Comments:  No specialty comments available.  PMFS History: Patient Active Problem List   Diagnosis Date Noted   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   Dehiscence of amputation stump of right lower extremity (HCC) 12/20/2022  Gangrene of toe of right foot (HCC) 11/11/2022   AKI (acute kidney injury) (HCC) 11/11/2022   Status post partial colectomy 10/04/2022   Type 2 diabetes mellitus with hyperglycemia, with long-term current use of insulin (HCC) 04/03/2022   Type 2 diabetes mellitus with diabetic polyneuropathy, with long-term current use of insulin (HCC) 04/03/2022   Diabetes mellitus (HCC) 04/03/2022   Colonic mass 03/04/2022   Skin ulcer of right great toe (HCC) 01/07/2022   Primary insomnia 12/19/2021   Noncompliance 05/30/2021   Back pain of lumbar region with sciatica 01/31/2021   Epididymoorchitis 12/31/2020   Anemia of unknown etiology    Arthritis    Coronary artery disease    Degenerative disc disease, lumbar    Depression    Diabetes mellitus without complication (HCC)    GERD (gastroesophageal reflux disease)    Family history of adverse reaction to anesthesia    Hyperlipemia    Myocardial infarction (HCC)     Elevated alkaline phosphatase level 11/27/2020   Iron deficiency anemia due to chronic blood loss 11/07/2020   Vitamin B12 deficiency 11/02/2020   Anxiety disorder 11/01/2020   Benign prostatic hyperplasia with urinary frequency 11/01/2020   Coronary artery disease of native artery of native heart with stable angina pectoris (HCC) 11/01/2020   Mild intermittent asthma without complication 11/01/2020   Moderate episode of recurrent major depressive disorder (HCC) 11/01/2020   Psoriasis 11/01/2020   Uncomplicated opioid dependence (HCC) 11/01/2020   Primary osteoarthritis involving multiple joints 11/01/2020   Degenerative lumbar disc 11/01/2020   Mixed hyperlipidemia 06/28/2020   Malaise and fatigue 06/28/2020   Pneumonia 08/2016   History of arthroplasty of right knee 10/20/2015   Sepsis, unspecified organism (HCC) 10/20/2015   Primary osteoarthritis of right knee 09/30/2015   Morbid obesity (HCC) 08/23/2015   Essential (primary) hypertension 08/23/2015   Degeneration of intervertebral disc of lumbosacral region 08/23/2015   Generalized abdominal pain 08/23/2015   Class 2 severe obesity due to excess calories with serious comorbidity and body mass index (BMI) of 38.0 to 38.9 in adult [facility]) 08/23/2015   Type 2 diabetes mellitus without complications (HCC) 08/23/2015   Pain in lower limb 12/07/2013   Plantar fasciitis of left foot 11/01/2013   Metatarsal deformity 11/01/2013   Diabetes (HCC) 11/01/2013   Type 2 diabetes mellitus without complication, with long-term current use of insulin (HCC) 11/01/2013   DEHYDRATION 01/26/2009   DYSTHYMIC DISORDER 01/26/2009   Atherosclerosis of native artery of extremity with intermittent claudication (HCC) 01/26/2009   CLAUDICATION 01/26/2009   DYSPNEA 01/26/2009   CHEST PAIN UNSPECIFIED 01/26/2009   Past Medical History:  Diagnosis Date   AKI (acute kidney injury) (HCC) 11/11/2022   01/14/23,  numbers improved   Anemia of unknown  etiology    Anxiety disorder    Arthritis    ATHEROSLERO NATV ART EXTREM W/INTERMIT CLAUDICAT 01/26/2009   Qualifier: Diagnosis of  By: Valinda Party RN, Nancy     Back pain of lumbar region with sciatica 01/31/2021   Benign prostatic hyperplasia with urinary frequency    CHEST PAIN UNSPECIFIED 01/26/2009   Qualifier: Diagnosis of  By: Valinda Party RN, Harriett Sine     Class 2 severe obesity due to excess calories with serious comorbidity and body mass index (BMI) of 38.0 to 38.9 in adult [facility])    CLAUDICATION 01/26/2009   Qualifier: Diagnosis of  By: Valinda Party RN, Nancy     Colonic mass 03/04/2022   Coronary artery disease    minimal CAD '12; NL stress  test 06/10/14 ([facility])   Coronary artery disease of native artery of native heart with stable angina pectoris (HCC)    Degeneration of intervertebral disc of lumbosacral region 08/23/2015   Degenerative disc disease, lumbar    Degenerative lumbar disc 11/01/2020   Formatting of this note might be different from the original. Was recommended back surgery. Holding out.   Dehydration 01/26/2009   Qualifier: Diagnosis of  By: Valinda Party RN, Nancy     Depression    Diabetes mellitus without complication (HCC)    DYSPNEA 01/26/2009   Qualifier: Diagnosis of  By: Valinda Party RN, Harriett Sine     Dysthymic disorder 01/26/2009   Qualifier: Diagnosis of  By: Valinda Party RN, Nancy     Elevated alkaline phosphatase level 11/27/2020   Epididymoorchitis 12/31/2020   Essential (primary) hypertension 08/23/2015   Family history of adverse reaction to anesthesia    "it made my son sick"   Gangrene of toe of right foot (HCC) 11/11/2022   Generalized abdominal pain 08/23/2015   GERD (gastroesophageal reflux disease)    History of arthroplasty of right knee    Hyperlipemia    Iron deficiency anemia due to chronic blood loss 11/07/2020   Malaise and fatigue 06/28/2020   Metatarsal deformity 11/01/2013   Mild intermittent asthma without complication    Mixed hyperlipidemia     Moderate episode of recurrent major depressive disorder (HCC)    Morbid obesity (HCC) 08/23/2015   Myocardial infarction (HCC)    2012; hospitalized [facility] for chest pain syndrome 02/2011 and LHC showed trivial CAD   Neuropathy    Noncompliance 05/30/2021   Pain in lower limb 12/07/2013   Plantar fasciitis of left foot 11/01/2013   Pneumonia 08/2016   surgery had to be rescheduled due to pneumonia   Primary insomnia 12/19/2021   Formatting of this note might be different from the original.  Avoid Rx   Primary osteoarthritis involving multiple joints 11/01/2020   Formatting of this note might be different from the original. both knees replaced.   Primary osteoarthritis of left knee 09/06/2016   Primary osteoarthritis of right knee 09/30/2015   Psoriasis    Sepsis, unspecified organism (HCC) 10/20/2015   Skin ulcer of right great toe (HCC) 01/07/2022   Status post partial colectomy 10/04/2022   Type 2 diabetes mellitus with diabetic polyneuropathy, with long-term current use of insulin (HCC) 04/03/2022   Type 2 diabetes mellitus without complications (HCC) 08/23/2015   Uncomplicated opioid dependence (HCC)    Vitamin B12 deficiency 11/02/2020    Family History  Problem Relation Age of Onset   Cancer Mother    Diabetes Mother    Hyperlipidemia Father    Hypertension Father    Heart failure Father    Heart failure Brother     Past Surgical History:  Procedure Laterality Date   AMPUTATION Right 11/15/2022   Procedure: RIGHT TRANSMETATARSAL AMPUTATION;  Surgeon: Nadara Mustard, MD;  Location: [facility] OR;  Service: Orthopedics;  Laterality: Right;   AMPUTATION Right 12/20/2022   Procedure: RIGHT CHOPART AMPUTATION;  Surgeon: Nadara Mustard, MD;  Location: [facility] OR;  Service: Orthopedics;  Laterality: Right;   AMPUTATION Right 01/15/2023   Procedure: RIGHT BELOW KNEE AMPUTATION;  Surgeon: Nadara Mustard, MD;  Location: [facility] OR;  Service: Orthopedics;  Laterality: Right;   CARDIAC CATHETERIZATION      03/28/11 LHC: NL LM, LAD; 10% mCX, mRCA. EF 65%. (Dr. Dot Been, [facility])   CHOLECYSTECTOMY     CORONARY ANGIOPLASTY  2012 [facility]    ESOPHAGEAL DILATION     SEVERAL TIMES   Heel Spur Resection with Fasiotomy Left 11/25/2013   @ [facility]   KNEE ARTHROSCOPY Bilateral    SHOULDER ARTHROSCOPY WITH OPEN ROTATOR CUFF REPAIR Right    TOTAL KNEE ARTHROPLASTY Right 10/04/2015   Procedure: RIGHT TOTAL KNEE ARTHROPLASTY;  Surgeon: Gean Birchwood, MD;  Location: [facility] OR;  Service: Orthopedics;  Laterality: Right;   TOTAL KNEE ARTHROPLASTY Left 10/14/2016   TOTAL KNEE ARTHROPLASTY Left 10/14/2016   Procedure: LEFT TOTAL KNEE ARTHROPLASTY;  Surgeon: Gean Birchwood, MD;  Location: [facility] OR;  Service: Orthopedics;  Laterality: Left;   TUMOR REMOVAL     FATTY TUMOR  RT SHOULDER   Social History   Occupational History   Not on file  Tobacco Use   Smoking status: Never   Smokeless tobacco: Never  Vaping Use   Vaping status: Never Used  Substance and Sexual Activity   Alcohol use: No   Drug use: No   Sexual activity: Not on file

## 2023-06-04 NOTE — Therapy (Deleted)
OUTPATIENT PHYSICAL THERAPY PROSTHETIC EVALUATION   Patient Name: Kyle [name] MRN: [id number] DOB:28-Jul-1961, 62 y.o., male Today's Date: 06/04/2023  PCP: Gordan Payment, MD REFERRING PROVIDER: Aldean Baker, MD  END OF SESSION:   Past Medical History:  Diagnosis Date   AKI (acute kidney injury) (HCC) 11/11/2022   01/14/23,  numbers improved   Anemia of unknown etiology    Anxiety disorder    Arthritis    ATHEROSLERO NATV ART EXTREM W/INTERMIT CLAUDICAT 01/26/2009   Qualifier: Diagnosis of  By: Valinda Party RN, Nancy     Back pain of lumbar region with sciatica 01/31/2021   Benign prostatic hyperplasia with urinary frequency    CHEST PAIN UNSPECIFIED 01/26/2009   Qualifier: Diagnosis of  By: Valinda Party RN, Harriett Sine     Class 2 severe obesity due to excess calories with serious comorbidity and body mass index (BMI) of 38.0 to 38.9 in adult [facility])    CLAUDICATION 01/26/2009   Qualifier: Diagnosis of  By: Valinda Party RN, Nancy     Colonic mass 03/04/2022   Coronary artery disease    minimal CAD '12; NL stress test 06/10/14 ([facility])   Coronary artery disease of native artery of native heart with stable angina pectoris (HCC)    Degeneration of intervertebral disc of lumbosacral region 08/23/2015   Degenerative disc disease, lumbar    Degenerative lumbar disc 11/01/2020   Formatting of this note might be different from the original. Was recommended back surgery. Holding out.   Dehydration 01/26/2009   Qualifier: Diagnosis of  By: Valinda Party RN, Nancy     Depression    Diabetes mellitus without complication (HCC)    DYSPNEA 01/26/2009   Qualifier: Diagnosis of  By: Valinda Party RN, Harriett Sine     Dysthymic disorder 01/26/2009   Qualifier: Diagnosis of  By: Valinda Party RN, Nancy     Elevated alkaline phosphatase level 11/27/2020   Epididymoorchitis 12/31/2020   Essential (primary) hypertension 08/23/2015   Family history of adverse reaction to anesthesia    "it made my son sick"   Gangrene of toe of right  foot (HCC) 11/11/2022   Generalized abdominal pain 08/23/2015   GERD (gastroesophageal reflux disease)    History of arthroplasty of right knee    Hyperlipemia    Iron deficiency anemia due to chronic blood loss 11/07/2020   Malaise and fatigue 06/28/2020   Metatarsal deformity 11/01/2013   Mild intermittent asthma without complication    Mixed hyperlipidemia    Moderate episode of recurrent major depressive disorder (HCC)    Morbid obesity (HCC) 08/23/2015   Myocardial infarction (HCC)    2012; hospitalized [facility] for chest pain syndrome 02/2011 and LHC showed trivial CAD   Neuropathy    Noncompliance 05/30/2021   Pain in lower limb 12/07/2013   Plantar fasciitis of left foot 11/01/2013   Pneumonia 08/2016   surgery had to be rescheduled due to pneumonia   Primary insomnia 12/19/2021   Formatting of this note might be different from the original.  Avoid Rx   Primary osteoarthritis involving multiple joints 11/01/2020   Formatting of this note might be different from the original. both knees replaced.   Primary osteoarthritis of left knee 09/06/2016   Primary osteoarthritis of right knee 09/30/2015   Psoriasis    Sepsis, unspecified organism (HCC) 10/20/2015   Skin ulcer of right great toe (HCC) 01/07/2022   Status post partial colectomy 10/04/2022   Type 2 diabetes mellitus with diabetic polyneuropathy, with long-term current use of insulin (HCC) 04/03/2022  Type 2 diabetes mellitus without complications (HCC) 08/23/2015   Uncomplicated opioid dependence (HCC)    Vitamin B12 deficiency 11/02/2020   Past Surgical History:  Procedure Laterality Date   AMPUTATION Right 11/15/2022   Procedure: RIGHT TRANSMETATARSAL AMPUTATION;  Surgeon: Nadara Mustard, MD;  Location: [facility] OR;  Service: Orthopedics;  Laterality: Right;   AMPUTATION Right 12/20/2022   Procedure: RIGHT CHOPART AMPUTATION;  Surgeon: Nadara Mustard, MD;  Location: [facility] OR;  Service: Orthopedics;  Laterality: Right;    AMPUTATION Right 01/15/2023   Procedure: RIGHT BELOW KNEE AMPUTATION;  Surgeon: Nadara Mustard, MD;  Location: [facility] OR;  Service: Orthopedics;  Laterality: Right;   CARDIAC CATHETERIZATION     03/28/11 LHC: NL LM, LAD; 10% mCX, mRCA. EF 65%. (Dr. Dot Been, [facility])   CHOLECYSTECTOMY     CORONARY ANGIOPLASTY     2012 [facility]    ESOPHAGEAL DILATION     SEVERAL TIMES   Heel Spur Resection with Fasiotomy Left 11/25/2013   @ [facility]   KNEE ARTHROSCOPY Bilateral    SHOULDER ARTHROSCOPY WITH OPEN ROTATOR CUFF REPAIR Right    TOTAL KNEE ARTHROPLASTY Right 10/04/2015   Procedure: RIGHT TOTAL KNEE ARTHROPLASTY;  Surgeon: Gean Birchwood, MD;  Location: [facility] OR;  Service: Orthopedics;  Laterality: Right;   TOTAL KNEE ARTHROPLASTY Left 10/14/2016   TOTAL KNEE ARTHROPLASTY Left 10/14/2016   Procedure: LEFT TOTAL KNEE ARTHROPLASTY;  Surgeon: Gean Birchwood, MD;  Location: [facility] OR;  Service: Orthopedics;  Laterality: Left;   TUMOR REMOVAL     FATTY TUMOR  RT SHOULDER   Patient Active Problem List   Diagnosis Date Noted   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   Dehiscence of amputation stump of right lower extremity (HCC) 12/20/2022   Gangrene of toe of right foot (HCC) 11/11/2022   AKI (acute kidney injury) (HCC) 11/11/2022   Status post partial colectomy 10/04/2022   Type 2 diabetes mellitus with hyperglycemia, with long-term current use of insulin (HCC) 04/03/2022   Type 2 diabetes mellitus with diabetic polyneuropathy, with long-term current use of insulin (HCC) 04/03/2022   Diabetes mellitus (HCC) 04/03/2022   Colonic mass 03/04/2022   Skin ulcer of right great toe (HCC) 01/07/2022   Primary insomnia 12/19/2021   Noncompliance 05/30/2021   Back pain of lumbar region with sciatica 01/31/2021   Epididymoorchitis 12/31/2020   Anemia of unknown etiology    Arthritis    Coronary artery disease    Degenerative disc disease, lumbar    Depression    Diabetes mellitus without  complication (HCC)    GERD (gastroesophageal reflux disease)    Family history of adverse reaction to anesthesia    Hyperlipemia    Myocardial infarction (HCC)    Elevated alkaline phosphatase level 11/27/2020   Iron deficiency anemia due to chronic blood loss 11/07/2020   Vitamin B12 deficiency 11/02/2020   Anxiety disorder 11/01/2020   Benign prostatic hyperplasia with urinary frequency 11/01/2020   Coronary artery disease of native artery of native heart with stable angina pectoris (HCC) 11/01/2020   Mild intermittent asthma without complication 11/01/2020   Moderate episode of recurrent major depressive disorder (HCC) 11/01/2020   Psoriasis 11/01/2020   Uncomplicated opioid dependence (HCC) 11/01/2020   Primary osteoarthritis involving multiple joints 11/01/2020   Degenerative lumbar disc 11/01/2020   Mixed hyperlipidemia 06/28/2020   Malaise and fatigue 06/28/2020   Pneumonia 08/2016   History of arthroplasty of right knee 10/20/2015   Sepsis, unspecified organism (HCC) 10/20/2015  Primary osteoarthritis of right knee 09/30/2015   Morbid obesity (HCC) 08/23/2015   Essential (primary) hypertension 08/23/2015   Degeneration of intervertebral disc of lumbosacral region 08/23/2015   Generalized abdominal pain 08/23/2015   Class 2 severe obesity due to excess calories with serious comorbidity and body mass index (BMI) of 38.0 to 38.9 in adult [facility]) 08/23/2015   Type 2 diabetes mellitus without complications (HCC) 08/23/2015   Pain in lower limb 12/07/2013   Plantar fasciitis of left foot 11/01/2013   Metatarsal deformity 11/01/2013   Diabetes (HCC) 11/01/2013   Type 2 diabetes mellitus without complication, with long-term current use of insulin (HCC) 11/01/2013   DEHYDRATION 01/26/2009   DYSTHYMIC DISORDER 01/26/2009   Atherosclerosis of native artery of extremity with intermittent claudication (HCC) 01/26/2009   CLAUDICATION 01/26/2009   DYSPNEA 01/26/2009   CHEST PAIN  UNSPECIFIED 01/26/2009    ONSET DATE: 05/23/2023 prosthesis delivery  REFERRING DIAG: T55.732K (ICD-10-CM) - Below-knee amputation of right lower extremity, sequela   THERAPY DIAG:  No diagnosis found.  Rationale for Evaluation and Treatment: Rehabilitation  SUBJECTIVE:   SUBJECTIVE STATEMENT: This 62yo male underwent a right Transtibial Amputation on 01/15/2023 due to nonhealing Chopart Amputation 5/24/12024 & Transmetatarsal Amputation 11/15/2022. Patient received his prosthesis on 05/23/2023. He had an office visit with Dr. Lajoyce Corners on 05/26/2023 and has cellulitis & ulceration on left forefoot. He was started on course of antibiotics and placed in postoperative shoe.  Pt accompanied by: {accompnied:27141}  PERTINENT HISTORY: right TTA 01/15/23, left TKA 2018, Right TKA 2017, arthritis, lumbar DDD with LBP with sciatica, obesity, CAD, DM2, polyneuropathy,  HTN, depression, hyperglycemia, colonic mass, anxiety disorder  PAIN:  Are you having pain? {OPRCPAIN:27236}  PRECAUTIONS: {Therapy precautions:24002}  WEIGHT BEARING RESTRICTIONS: {Yes ***/No:24003}  FALLS: Has patient fallen in last 6 months? {fallsyesno:27318}  LIVING ENVIRONMENT: Lives with: {OPRC lives with:25569::"lives with their family"} Lives in: {Lives in:25570} Home Access: {HOMEACCESS:26869} Home layout: {Home Layout:26870} Stairs: {opstairs:27293} Has following equipment at home: {Assistive devices:23999}  OCCUPATION: ***  PLOF: {PLOF:24004}  PATIENT GOALS: ***  OBJECTIVE:  DIAGNOSTIC FINDINGS: ***  COGNITION: Overall cognitive status: {cognition:24006}   SENSATION: {sensation:27233}  MUSCLE LENGTH: Hamstrings: Right *** deg; Left *** deg Thomas test: Right *** deg; Left *** deg  DTRs:  {DTR SITE:24025}  POSTURE: {posture:25561}  LOWER EXTREMITY ROM:  ROM P:passive  A:active Right eval Left eval  Hip flexion ***   Hip extension    Hip abduction    Hip adduction    Hip internal rotation     Hip external rotation    Knee flexion    Knee extension    Ankle dorsiflexion    Ankle plantarflexion    Ankle inversion    Ankle eversion     (Blank rows = not tested)  LOWER EXTREMITY MMT:  MMT Right eval Left eval  Hip flexion ***   Hip extension    Hip abduction    Hip adduction    Hip internal rotation    Hip external rotation    Knee flexion    Knee extension    Ankle dorsiflexion    Ankle plantarflexion    Ankle inversion    Ankle eversion    At Evaluation all strength testing is grossly seated and functionally standing / gait. (Blank rows = not tested)  BED MOBILITY:  {Bed mobility:24027}  TRANSFERS: {transfers:26872}  FUNCTIONAL TESTs:  {Functional tests:24029}  GAIT: Gait pattern: {gait characteristics:25376} Distance walked: *** Assistive device utilized: {Assistive devices:23999} Level of assistance: {Levels  of assistance:24026} Gait velocity: *** {uom:26873} Comments: ***  RAMP {Levels of assistance:24026}  CURB: {Levels of assistance:24026}  CARDIOVASCULAR RESPONSE: Functional activity: *** Pre-activity vitals: BP: *** HR: *** SpO2: *** Post-activity vitals: BP: *** HR: *** SpO2: *** Modified Borg scale for dyspnea: {Modified Borg scale:26876}  CURRENT PROSTHETIC WEAR ASSESSMENT: 06/04/2023:  Patient is independent with: {prosthetic NWGN:56213} Patient is dependent with: {prosthetic care:26874} Donning prosthesis: {Levels of assistance:24026} Doffing prosthesis: {Levels of assistance:24026} Prosthetic wear tolerance: *** hours, ***x/day, *** days/week Prosthetic weight bearing tolerance: *** minutes Edema: *** Residual limb condition: *** Prosthetic description: *** K code/activity level with prosthetic use: {K-levels:26875}    TODAY'S TREATMENT:                                                                                                                             DATE:  06/04/2023: Prosthetic Training with Transtibial  Prosthesis: See patient education below.   PATIENT EDUCATION: PATIENT EDUCATED ON FOLLOWING PROSTHETIC CARE: Education details: ***  {PTprostheticeducation:27015} Prosthetic wear tolerance: *** hours ***/day, *** days/week Person educated: {Person educated:25204} Education method: {Education Method:25205} Education comprehension: {Education Comprehension:25206}  HOME EXERCISE PROGRAM:  ASSESSMENT:  CLINICAL IMPRESSION: Patient is a 62 y.o. male who was seen today for physical therapy evaluation and treatment for prosthetic training with Transtibial Amputation. ***  OBJECTIVE IMPAIRMENTS: {opptimpairments:25111}.   ACTIVITY LIMITATIONS: {activitylimitations:27494}  PARTICIPATION LIMITATIONS: {participationrestrictions:25113}  PERSONAL FACTORS: {Personal factors:25162} are also affecting patient's functional outcome.   REHAB POTENTIAL: {rehabpotential:25112}  CLINICAL DECISION MAKING: {clinical decision making:25114}  EVALUATION COMPLEXITY: {Evaluation complexity:25115}   GOALS: Goals reviewed with patient? Yes  SHORT TERM GOALS: Target date: ***  Patient donnes prosthesis modified independent & verbalizes proper cleaning. Baseline: SEE OBJECTIVE DATA Goal status: INITIAL 2.  Patient tolerates prosthesis >10 hrs total /day without skin issues or limb pain >5/10 after standing. Baseline: SEE OBJECTIVE DATA Goal status: INITIAL  3.  Patient able to reach 7" and look over both shoulders without UE support with supervision. Baseline: SEE OBJECTIVE DATA Goal status: INITIAL  4. Patient ambulates 63' with RW & prosthesis with supervision. Baseline: SEE OBJECTIVE DATA Goal status: INITIAL  5. Patient negotiates ramps & curbs with RW & prosthesis with minA. Baseline: SEE OBJECTIVE DATA Goal status: INITIAL  LONG TERM GOALS: Target date: ***  Patient demonstrates & verbalized understanding of prosthetic care to enable safe utilization of prosthesis. Baseline: SEE  OBJECTIVE DATA Goal status: INITIAL  Patient tolerates prosthesis wear >90% of awake hours without skin or limb pain issues. Baseline: SEE OBJECTIVE DATA Goal status: INITIAL  Functional Gait Assessment >/= 19/30 to indicate lower fall risk Baseline: SEE OBJECTIVE DATA Goal status: INITIAL  Patient ambulates >500' with prosthesis only independently Baseline: SEE OBJECTIVE DATA Goal status: INITIAL  Patient negotiates ramps, curbs & stairs with single rail with prosthesis only independently. Baseline: SEE OBJECTIVE DATA Goal status: INITIAL  Patient demonstrates & verbalizes lifting, carrying, pushing, pulling with prosthesis only safely. Baseline: SEE OBJECTIVE DATA  Goal status: INITIAL  Patient verbalizes & demonstrates understanding of how to properly use fitness equipment to return to gym. Baseline: SEE OBJECTIVE DATA Goal status: INITIAL  PLAN:  PT FREQUENCY: {rehab frequency:25116}  PT DURATION: {rehab duration:25117}  PLANNED INTERVENTIONS: {rehab planned interventions:25118::"Therapeutic exercises","Therapeutic activity","Neuromuscular re-education","Balance training","Gait training","Patient/Family education","Self Care","Joint mobilization"}  PLAN FOR NEXT SESSION: ***  Date of referral: 04/07/2023 (PT Eval held until prosthesis delivered) Referring provider: Aldean Baker, MD Referring diagnosis? U98.119J (ICD-10-CM) - Below-knee amputation of right lower extremity, sequela  Treatment diagnosis? (if different than referring diagnosis) ***  What was this (referring dx) caused by? Surgery (Type: right below knee amputation)  Ashby Dawes of Condition: Initial Onset (within last 3 months)  prosthesis delivery within last 3 months   Laterality: Rt  Current Functional Measure Score: {Functional Measure Score:30891}  Objective measurements identify impairments when they are compared to normal values, the uninvolved extremity, and prior level of function.  []  Yes  []   No  Objective assessment of functional ability: {assessment of functional ability:30892}   Briefly describe symptoms: ***  How did symptoms start: ***  Average pain intensity:  Last 24 hours: ***  Past week: ***  How often does the pt experience symptoms? {Frequency of symptoms:30893}  How much have the symptoms interfered with usual daily activities? {Interfere with YNWG:95621}  How has condition changed since care began at this facility? {Condition Changed:30895}  In general, how is the patients overall health? {Overall Health:30896}   BACK PAIN (STarT Back Screening Tool) {BACK HYQM:57846}   Vladimir Faster, PT, DPT 06/04/2023, 7:37 AM

## 2023-06-17 ENCOUNTER — Ambulatory Visit: Payer: Medicare Other | Admitting: Orthopedic Surgery

## 2023-06-19 ENCOUNTER — Ambulatory Visit: Payer: Medicare Other | Admitting: Family

## 2023-07-01 ENCOUNTER — Ambulatory Visit (INDEPENDENT_AMBULATORY_CARE_PROVIDER_SITE_OTHER): Payer: Medicare Other | Admitting: Family

## 2023-07-01 ENCOUNTER — Encounter: Payer: Self-pay | Admitting: Family

## 2023-07-01 DIAGNOSIS — L97521 Non-pressure chronic ulcer of other part of left foot limited to breakdown of skin: Secondary | ICD-10-CM | POA: Diagnosis not present

## 2023-07-01 DIAGNOSIS — L03032 Cellulitis of left toe: Secondary | ICD-10-CM

## 2023-07-01 MED ORDER — MUPIROCIN 2 % EX OINT: 1.0000 | TOPICAL_OINTMENT | Freq: Two times a day (BID) | CUTANEOUS | 3 refills | Status: DC

## 2023-07-02 NOTE — Progress Notes (Signed)
Office Visit Note   Patient: Kyle [name]           Date of Birth: January 08, 1961           MRN: [id number] Visit Date: 07/01/2023              Requested by: [name], MD [address] [place],  [place] [address] PCP: [name], MD  Chief Complaint  Patient presents with   Left Foot - Wound Check      HPI: The patient is a 62 year old gentleman seen in follow-up for ulcer and cellulitis to the left foot he has been doing Bactroban dressing changes daily and has completed a course of doxycycline.  Assessment & Plan: Visit Diagnoses: No diagnosis found.  Plan: Continue with daily Dial soap cleansing close monitoring of the foot will refill his Bactroban he may use continue to use this for dressing changes  Follow-Up Instructions: Return in about 2 weeks (around 07/15/2023).   Ortho Exam  Patient is alert, oriented, no adenopathy, well-dressed, normal affect, normal respiratory effort. On examination of the left foot the plantar aspect of his foot has healed he does continue with a fissure in the MTP joint beneath the great toe and then there is ulceration laterally and over the dorsum of the toe this is 2 cm x 1 cm filled in with 100% fibrinous tissue there is no surrounding erythema or warmth no active drainage Debrided the hyperkeratotic tissue to the plantar aspect of the foot with a 10 blade knife back to viable tissue   Imaging: No results found. No images are attached to the encounter.  Labs: Lab Results  Component Value Date   HGBA1C 7.0 (H) 11/12/2022   HGBA1C 9.2 (H) 10/14/2016   HGBA1C 8.8 (H) 09/03/2016   ESRSEDRATE 79 (H) 11/11/2022   CRP 18.4 (H) 11/11/2022   REPTSTATUS 11/16/2022 FINAL 11/11/2022   CULT  11/11/2022    NO GROWTH 5 DAYS Performed at [facility], [address], [place], [place] [address]      Lab Results  Component Value Date   ALBUMIN 3.4 (L) 11/11/2022    Lab Results  Component Value Date   MG 1.8 11/15/2022    MG 1.6 (L) 11/14/2022   No results found for: "VD25OH"  No results found for: "PREALBUMIN"    Latest Ref Rng & Units 01/15/2023   10:02 AM 11/13/2022    1:06 AM 11/12/2022    2:53 AM  CBC EXTENDED  WBC 4.0 - 10.5 K/uL 10.0  8.4  11.1   RBC 4.22 - 5.81 MIL/uL 3.84  3.72  3.39   Hemoglobin 13.0 - 17.0 g/dL 82.9  56.2  9.2   HCT 13.0 - 52.0 % 33.5  32.1  29.3   Platelets 150 - 400 K/uL 462  320  294   NEUT# 1.7 - 7.7 K/uL  4.7    Lymph# 0.7 - 4.0 K/uL  2.4       There is no height or weight on file to calculate BMI.  Orders:  No orders of the defined types were placed in this encounter.  Meds ordered this encounter  Medications   mupirocin ointment (BACTROBAN) 2 %    Sig: Apply 1 Application topically 2 (two) times daily. Apply to the affected area 2 times a day    Dispense:  22 g    Refill:  3     Procedures: No procedures performed  Clinical Data: No additional  findings.  ROS:  All other systems negative, except as noted in the HPI. Review of Systems  Objective: Vital Signs: There were no vitals taken for this visit.  Specialty Comments:  No specialty comments available.  PMFS History: Patient Active Problem List   Diagnosis Date Noted   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   Dehiscence of amputation stump of right lower extremity (HCC) 12/20/2022   Gangrene of toe of right foot (HCC) 11/11/2022   AKI (acute kidney injury) (HCC) 11/11/2022   Status post partial colectomy 10/04/2022   Type 2 diabetes mellitus with hyperglycemia, with long-term current use of insulin (HCC) 04/03/2022   Type 2 diabetes mellitus with diabetic polyneuropathy, with long-term current use of insulin (HCC) 04/03/2022   Diabetes mellitus (HCC) 04/03/2022   Colonic mass 03/04/2022   Skin ulcer of right great toe (HCC) 01/07/2022   Primary insomnia 12/19/2021   Noncompliance 05/30/2021   Back pain of lumbar region with sciatica 01/31/2021    Epididymoorchitis 12/31/2020   Anemia of unknown etiology    Arthritis    Coronary artery disease    Degenerative disc disease, lumbar    Depression    Diabetes mellitus without complication (HCC)    GERD (gastroesophageal reflux disease)    Family history of adverse reaction to anesthesia    Hyperlipemia    Myocardial infarction (HCC)    Elevated alkaline phosphatase level 11/27/2020   Iron deficiency anemia due to chronic blood loss 11/07/2020   Vitamin B12 deficiency 11/02/2020   Anxiety disorder 11/01/2020   Benign prostatic hyperplasia with urinary frequency 11/01/2020   Coronary artery disease of native artery of native heart with stable angina pectoris (HCC) 11/01/2020   Mild intermittent asthma without complication 11/01/2020   Moderate episode of recurrent major depressive disorder (HCC) 11/01/2020   Psoriasis 11/01/2020   Uncomplicated opioid dependence (HCC) 11/01/2020   Primary osteoarthritis involving multiple joints 11/01/2020   Degenerative lumbar disc 11/01/2020   Mixed hyperlipidemia 06/28/2020   Malaise and fatigue 06/28/2020   Pneumonia 08/2016   History of arthroplasty of right knee 10/20/2015   Sepsis, unspecified organism (HCC) 10/20/2015   Primary osteoarthritis of right knee 09/30/2015   Morbid obesity (HCC) 08/23/2015   Essential (primary) hypertension 08/23/2015   Degeneration of intervertebral disc of lumbosacral region 08/23/2015   Generalized abdominal pain 08/23/2015   Class 2 severe obesity due to excess calories with serious comorbidity and body mass index (BMI) of 38.0 to 38.9 in adult [facility]) 08/23/2015   Type 2 diabetes mellitus without complications (HCC) 08/23/2015   Pain in lower limb 12/07/2013   Plantar fasciitis of left foot 11/01/2013   Metatarsal deformity 11/01/2013   Diabetes (HCC) 11/01/2013   Type 2 diabetes mellitus without complication, with long-term current use of insulin (HCC) 11/01/2013   DEHYDRATION 01/26/2009   DYSTHYMIC  DISORDER 01/26/2009   Atherosclerosis of native artery of extremity with intermittent claudication (HCC) 01/26/2009   CLAUDICATION 01/26/2009   DYSPNEA 01/26/2009   CHEST PAIN UNSPECIFIED 01/26/2009   Past Medical History:  Diagnosis Date   AKI (acute kidney injury) (HCC) 11/11/2022   01/14/23,  numbers improved   Anemia of unknown etiology    Anxiety disorder    Arthritis    ATHEROSLERO NATV ART EXTREM W/INTERMIT CLAUDICAT 01/26/2009   Qualifier: Diagnosis of  By: Valinda Party RN, Nancy     Back pain of lumbar region with sciatica 01/31/2021   Benign prostatic hyperplasia with urinary frequency    CHEST PAIN UNSPECIFIED  01/26/2009   Qualifier: Diagnosis of  By: Valinda Party RN, Rosario Jacks 2 severe obesity due to excess calories with serious comorbidity and body mass index (BMI) of 38.0 to 38.9 in adult [facility])    CLAUDICATION 01/26/2009   Qualifier: Diagnosis of  By: Valinda Party RN, Nancy     Colonic mass 03/04/2022   Coronary artery disease    minimal CAD '12; NL stress test 06/10/14 ([facility])   Coronary artery disease of native artery of native heart with stable angina pectoris (HCC)    Degeneration of intervertebral disc of lumbosacral region 08/23/2015   Degenerative disc disease, lumbar    Degenerative lumbar disc 11/01/2020   Formatting of this note might be different from the original. Was recommended back surgery. Holding out.   Dehydration 01/26/2009   Qualifier: Diagnosis of  By: Valinda Party RN, Nancy     Depression    Diabetes mellitus without complication (HCC)    DYSPNEA 01/26/2009   Qualifier: Diagnosis of  By: Valinda Party RN, Harriett Sine     Dysthymic disorder 01/26/2009   Qualifier: Diagnosis of  By: Valinda Party RN, Nancy     Elevated alkaline phosphatase level 11/27/2020   Epididymoorchitis 12/31/2020   Essential (primary) hypertension 08/23/2015   Family history of adverse reaction to anesthesia    "it made my son sick"   Gangrene of toe of right foot (HCC) 11/11/2022   Generalized  abdominal pain 08/23/2015   GERD (gastroesophageal reflux disease)    History of arthroplasty of right knee    Hyperlipemia    Iron deficiency anemia due to chronic blood loss 11/07/2020   Malaise and fatigue 06/28/2020   Metatarsal deformity 11/01/2013   Mild intermittent asthma without complication    Mixed hyperlipidemia    Moderate episode of recurrent major depressive disorder (HCC)    Morbid obesity (HCC) 08/23/2015   Myocardial infarction (HCC)    2012; hospitalized [facility] for chest pain syndrome 02/2011 and LHC showed trivial CAD   Neuropathy    Noncompliance 05/30/2021   Pain in lower limb 12/07/2013   Plantar fasciitis of left foot 11/01/2013   Pneumonia 08/2016   surgery had to be rescheduled due to pneumonia   Primary insomnia 12/19/2021   Formatting of this note might be different from the original.  Avoid Rx   Primary osteoarthritis involving multiple joints 11/01/2020   Formatting of this note might be different from the original. both knees replaced.   Primary osteoarthritis of left knee 09/06/2016   Primary osteoarthritis of right knee 09/30/2015   Psoriasis    Sepsis, unspecified organism (HCC) 10/20/2015   Skin ulcer of right great toe (HCC) 01/07/2022   Status post partial colectomy 10/04/2022   Type 2 diabetes mellitus with diabetic polyneuropathy, with long-term current use of insulin (HCC) 04/03/2022   Type 2 diabetes mellitus without complications (HCC) 08/23/2015   Uncomplicated opioid dependence (HCC)    Vitamin B12 deficiency 11/02/2020    Family History  Problem Relation Age of Onset   Cancer Mother    Diabetes Mother    Hyperlipidemia Father    Hypertension Father    Heart failure Father    Heart failure Brother     Past Surgical History:  Procedure Laterality Date   AMPUTATION Right 11/15/2022   Procedure: RIGHT TRANSMETATARSAL AMPUTATION;  Surgeon: Nadara Mustard, MD;  Location: [facility] OR;  Service: Orthopedics;  Laterality: Right;   AMPUTATION  Right 12/20/2022   Procedure: RIGHT CHOPART AMPUTATION;  Surgeon: Nadara Mustard,  MD;  Location: [facility] OR;  Service: Orthopedics;  Laterality: Right;   AMPUTATION Right 01/15/2023   Procedure: RIGHT BELOW KNEE AMPUTATION;  Surgeon: Nadara Mustard, MD;  Location: [facility] OR;  Service: Orthopedics;  Laterality: Right;   CARDIAC CATHETERIZATION     03/28/11 LHC: NL LM, LAD; 10% mCX, mRCA. EF 65%. (Dr. Dot Been, [facility])   CHOLECYSTECTOMY     CORONARY ANGIOPLASTY     2012 [facility]    ESOPHAGEAL DILATION     SEVERAL TIMES   Heel Spur Resection with Fasiotomy Left 11/25/2013   @ [facility]   KNEE ARTHROSCOPY Bilateral    SHOULDER ARTHROSCOPY WITH OPEN ROTATOR CUFF REPAIR Right    TOTAL KNEE ARTHROPLASTY Right 10/04/2015   Procedure: RIGHT TOTAL KNEE ARTHROPLASTY;  Surgeon: Gean Birchwood, MD;  Location: [facility] OR;  Service: Orthopedics;  Laterality: Right;   TOTAL KNEE ARTHROPLASTY Left 10/14/2016   TOTAL KNEE ARTHROPLASTY Left 10/14/2016   Procedure: LEFT TOTAL KNEE ARTHROPLASTY;  Surgeon: Gean Birchwood, MD;  Location: [facility] OR;  Service: Orthopedics;  Laterality: Left;   TUMOR REMOVAL     FATTY TUMOR  RT SHOULDER   Social History   Occupational History   Not on file  Tobacco Use   Smoking status: Never   Smokeless tobacco: Never  Vaping Use   Vaping status: Never Used  Substance and Sexual Activity   Alcohol use: No   Drug use: No   Sexual activity: Not on file

## 2023-07-29 ENCOUNTER — Ambulatory Visit: Payer: Medicare Other | Admitting: Family

## 2023-08-05 ENCOUNTER — Ambulatory Visit: Payer: Medicare Other | Admitting: Family

## 2023-08-08 ENCOUNTER — Encounter: Payer: Self-pay | Admitting: Family

## 2023-08-08 ENCOUNTER — Ambulatory Visit (INDEPENDENT_AMBULATORY_CARE_PROVIDER_SITE_OTHER): Payer: Medicare Other | Admitting: Family

## 2023-08-08 DIAGNOSIS — L03032 Cellulitis of left toe: Secondary | ICD-10-CM | POA: Diagnosis not present

## 2023-08-08 DIAGNOSIS — Z89511 Acquired absence of right leg below knee: Secondary | ICD-10-CM

## 2023-08-08 NOTE — Progress Notes (Signed)
 Office Visit Note   Patient: Kyle [name]           Date of Birth: [birth year]           MRN: [id number] Visit Date: 08/08/2023              Requested by: Thurmond Cathlyn LABOR., MD [address] [place],  [place] [address] PCP: Thurmond Cathlyn LABOR., MD  Chief Complaint  Patient presents with   Left Foot - Follow-up      HPI: The patient is a 63 year old gentleman who presents today in follow-up for cellulitis with ulceration to the left foot.  He completed a course of doxycycline  in late November.  Was doing Bactroban  dressing changes until he ran out of Bactroban .  He is mainly concerned that the mild erythema of the toes fourth and fifth on the left foot has been slow to resolve.  Assessment & Plan: Visit Diagnoses: No diagnosis found.  Plan: Nails trimmed x 5.  Patient unable to safely trim his own nails.  Reassurance provided there is cellulitis is resolving left foot.  Will also provide him a prescription for Bactroban   Follow-Up Instructions: Return in about 4 weeks (around 09/05/2023), or if symptoms worsen or fail to improve.   Ortho Exam  Patient is alert, oriented, no adenopathy, well-dressed, normal affect, normal respiratory effort. On examination of the left foot the ulcer to the fifth toe is nearly resolved this is well 5 mm in diameter superficial filled in with granulation there is very mild erythema to the fourth and fifth toes there is no warmth no ascending cellulitis does have a palpable dorsalis pedis pulse Thickened and discolored onychomycotic nails x 5.  Patient reports he is unable to safely trim these.  Imaging: No results found. No images are attached to the encounter.  Labs: Lab Results  Component Value Date   HGBA1C 7.0 (H) 11/12/2022   HGBA1C 9.2 (H) 10/14/2016   HGBA1C 8.8 (H) 09/03/2016   ESRSEDRATE 79 (H) 11/11/2022   CRP 18.4 (H) 11/11/2022   REPTSTATUS 11/16/2022 FINAL 11/11/2022   CULT  11/11/2022    NO GROWTH 5 DAYS Performed at [facility], [address], [place], [place] [address]      Lab Results  Component Value Date   ALBUMIN 3.4 (L) 11/11/2022    Lab Results  Component Value Date   MG 1.8 11/15/2022   MG 1.6 (L) 11/14/2022   No results found for: VD25OH  No results found for: PREALBUMIN    Latest Ref Rng & Units 01/15/2023   10:02 AM 11/13/2022    1:06 AM 11/12/2022    2:53 AM  CBC EXTENDED  WBC 4.0 - 10.5 K/uL 10.0  8.4  11.1   RBC 4.22 - 5.81 MIL/uL 3.84  3.72  3.39   Hemoglobin 13.0 - 17.0 g/dL 89.9  89.7  9.2   HCT 60.9 - 52.0 % 33.5  32.1  29.3   Platelets 150 - 400 K/uL 462  320  294   NEUT# 1.7 - 7.7 K/uL  4.7    Lymph# 0.7 - 4.0 K/uL  2.4       There is no height or weight on file to calculate BMI.  Orders:  No orders of the defined types were placed in this encounter.  No orders of the defined types were placed in this encounter.    Procedures: No procedures performed  Clinical Data: No additional findings.  ROS:  All other  systems negative, except as noted in the HPI. Review of Systems  Objective: Vital Signs: There were no vitals taken for this visit.  Specialty Comments:  No specialty comments available.  PMFS History: Patient Active Problem List   Diagnosis Date Noted   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   Dehiscence of amputation stump of right lower extremity (HCC) 12/20/2022   Gangrene of toe of right foot (HCC) 11/11/2022   AKI (acute kidney injury) (HCC) 11/11/2022   Status post partial colectomy 10/04/2022   Type 2 diabetes mellitus with hyperglycemia, with long-term current use of insulin  (HCC) 04/03/2022   Type 2 diabetes mellitus with diabetic polyneuropathy, with long-term current use of insulin  (HCC) 04/03/2022   Diabetes mellitus (HCC) 04/03/2022   Colonic mass 03/04/2022   Skin ulcer of right great toe (HCC) 01/07/2022   Primary insomnia 12/19/2021   Noncompliance 05/30/2021   Back pain of lumbar region with  sciatica 01/31/2021   Epididymoorchitis 12/31/2020   Anemia of unknown etiology    Arthritis    Coronary artery disease    Degenerative disc disease, lumbar    Depression    Diabetes mellitus without complication (HCC)    GERD (gastroesophageal reflux disease)    Family history of adverse reaction to anesthesia    Hyperlipemia    Myocardial infarction (HCC)    Elevated alkaline phosphatase level 11/27/2020   Iron deficiency anemia due to chronic blood loss 11/07/2020   Vitamin B12 deficiency 11/02/2020   Anxiety disorder 11/01/2020   Benign prostatic hyperplasia with urinary frequency 11/01/2020   Coronary artery disease of native artery of native heart with stable angina pectoris (HCC) 11/01/2020   Mild intermittent asthma without complication 11/01/2020   Moderate episode of recurrent major depressive disorder (HCC) 11/01/2020   Psoriasis 11/01/2020   Uncomplicated opioid dependence (HCC) 11/01/2020   Primary osteoarthritis involving multiple joints 11/01/2020   Degenerative lumbar disc 11/01/2020   Mixed hyperlipidemia 06/28/2020   Malaise and fatigue 06/28/2020   Pneumonia 08/2016   History of arthroplasty of right knee 10/20/2015   Sepsis, unspecified organism (HCC) 10/20/2015   Primary osteoarthritis of right knee 09/30/2015   Morbid obesity (HCC) 08/23/2015   Essential (primary) hypertension 08/23/2015   Degeneration of intervertebral disc of lumbosacral region 08/23/2015   Generalized abdominal pain 08/23/2015   Class 2 severe obesity due to excess calories with serious comorbidity and body mass index (BMI) of 38.0 to 38.9 in adult [facility]) 08/23/2015   Type 2 diabetes mellitus without complications (HCC) 08/23/2015   Pain in lower limb 12/07/2013   Plantar fasciitis of left foot 11/01/2013   Metatarsal deformity 11/01/2013   Diabetes (HCC) 11/01/2013   Type 2 diabetes mellitus without complication, with long-term current use of insulin  (HCC) 11/01/2013   DEHYDRATION  01/26/2009   DYSTHYMIC DISORDER 01/26/2009   Atherosclerosis of native artery of extremity with intermittent claudication (HCC) 01/26/2009   CLAUDICATION 01/26/2009   DYSPNEA 01/26/2009   CHEST PAIN UNSPECIFIED 01/26/2009   Past Medical History:  Diagnosis Date   AKI (acute kidney injury) (HCC) 11/11/2022   01/14/23,  numbers improved   Anemia of unknown etiology    Anxiety disorder    Arthritis    ATHEROSLERO NATV ART EXTREM W/INTERMIT CLAUDICAT 01/26/2009   Qualifier: Diagnosis of  By: Derek RN, Nancy     Back pain of lumbar region with sciatica 01/31/2021   Benign prostatic hyperplasia with urinary frequency    CHEST PAIN UNSPECIFIED 01/26/2009   Qualifier: Diagnosis of  By: Derek, RN, Inocente     Class 2 severe obesity due to excess calories with serious comorbidity and body mass index (BMI) of 38.0 to 38.9 in adult [facility])    CLAUDICATION 01/26/2009   Qualifier: Diagnosis of  By: Derek RN, Nancy     Colonic mass 03/04/2022   Coronary artery disease    minimal CAD '12; NL stress test 06/10/14 ([facility])   Coronary artery disease of native artery of native heart with stable angina pectoris (HCC)    Degeneration of intervertebral disc of lumbosacral region 08/23/2015   Degenerative disc disease, lumbar    Degenerative lumbar disc 11/01/2020   Formatting of this note might be different from the original. Was recommended back surgery. Holding out.   Dehydration 01/26/2009   Qualifier: Diagnosis of  By: Derek RN, Nancy     Depression    Diabetes mellitus without complication (HCC)    DYSPNEA 01/26/2009   Qualifier: Diagnosis of  By: Derek RN, Inocente     Dysthymic disorder 01/26/2009   Qualifier: Diagnosis of  By: Derek RN, Nancy     Elevated alkaline phosphatase level 11/27/2020   Epididymoorchitis 12/31/2020   Essential (primary) hypertension 08/23/2015   Family history of adverse reaction to anesthesia    it made my son sick   Gangrene of toe of right foot (HCC)  11/11/2022   Generalized abdominal pain 08/23/2015   GERD (gastroesophageal reflux disease)    History of arthroplasty of right knee    Hyperlipemia    Iron deficiency anemia due to chronic blood loss 11/07/2020   Malaise and fatigue 06/28/2020   Metatarsal deformity 11/01/2013   Mild intermittent asthma without complication    Mixed hyperlipidemia    Moderate episode of recurrent major depressive disorder (HCC)    Morbid obesity (HCC) 08/23/2015   Myocardial infarction (HCC)    2012; hospitalized [facility] for chest pain syndrome 02/2011 and LHC showed trivial CAD   Neuropathy    Noncompliance 05/30/2021   Pain in lower limb 12/07/2013   Plantar fasciitis of left foot 11/01/2013   Pneumonia 08/2016   surgery had to be rescheduled due to pneumonia   Primary insomnia 12/19/2021   Formatting of this note might be different from the original.  Avoid Rx   Primary osteoarthritis involving multiple joints 11/01/2020   Formatting of this note might be different from the original. both knees replaced.   Primary osteoarthritis of left knee 09/06/2016   Primary osteoarthritis of right knee 09/30/2015   Psoriasis    Sepsis, unspecified organism (HCC) 10/20/2015   Skin ulcer of right great toe (HCC) 01/07/2022   Status post partial colectomy 10/04/2022   Type 2 diabetes mellitus with diabetic polyneuropathy, with long-term current use of insulin  (HCC) 04/03/2022   Type 2 diabetes mellitus without complications (HCC) 08/23/2015   Uncomplicated opioid dependence (HCC)    Vitamin B12 deficiency 11/02/2020    Family History  Problem Relation Age of Onset   Cancer Mother    Diabetes Mother    Hyperlipidemia Father    Hypertension Father    Heart failure Father    Heart failure Brother     Past Surgical History:  Procedure Laterality Date   AMPUTATION Right 11/15/2022   Procedure: RIGHT TRANSMETATARSAL AMPUTATION;  Surgeon: Harden Jerona GAILS, MD;  Location: [facility] OR;  Service: Orthopedics;   Laterality: Right;   AMPUTATION Right 12/20/2022   Procedure: RIGHT CHOPART AMPUTATION;  Surgeon: Harden Jerona GAILS, MD;  Location: [facility] OR;  Service:  Orthopedics;  Laterality: Right;   AMPUTATION Right 01/15/2023   Procedure: RIGHT BELOW KNEE AMPUTATION;  Surgeon: Harden Jerona GAILS, MD;  Location: [facility] OR;  Service: Orthopedics;  Laterality: Right;   CARDIAC CATHETERIZATION     03/28/11 LHC: NL LM, LAD; 10% mCX, mRCA. EF 65%. (Dr. Maralee, [facility])   CHOLECYSTECTOMY     CORONARY ANGIOPLASTY     2012 [facility]    ESOPHAGEAL DILATION     SEVERAL TIMES   Heel Spur Resection with Fasiotomy Left 11/25/2013   @ [facility]   KNEE ARTHROSCOPY Bilateral    SHOULDER ARTHROSCOPY WITH OPEN ROTATOR CUFF REPAIR Right    TOTAL KNEE ARTHROPLASTY Right 10/04/2015   Procedure: RIGHT TOTAL KNEE ARTHROPLASTY;  Surgeon: Dempsey Sensor, MD;  Location: [facility] OR;  Service: Orthopedics;  Laterality: Right;   TOTAL KNEE ARTHROPLASTY Left 10/14/2016   TOTAL KNEE ARTHROPLASTY Left 10/14/2016   Procedure: LEFT TOTAL KNEE ARTHROPLASTY;  Surgeon: Dempsey Sensor, MD;  Location: [facility] OR;  Service: Orthopedics;  Laterality: Left;   TUMOR REMOVAL     FATTY TUMOR  RT SHOULDER   Social History   Occupational History   Not on file  Tobacco Use   Smoking status: Never   Smokeless tobacco: Never  Vaping Use   Vaping status: Never Used  Substance and Sexual Activity   Alcohol use: No   Drug use: No   Sexual activity: Not on file

## 2023-08-10 MED ORDER — MUPIROCIN 2 % EX OINT: 1.0000 | TOPICAL_OINTMENT | Freq: Every day | CUTANEOUS | 3 refills | Status: AC

## 2023-09-23 ENCOUNTER — Other Ambulatory Visit: Payer: Self-pay

## 2023-09-23 DIAGNOSIS — G629 Polyneuropathy, unspecified: Secondary | ICD-10-CM | POA: Insufficient documentation

## 2023-09-25 ENCOUNTER — Encounter (HOSPITAL_COMMUNITY): Payer: Self-pay

## 2023-09-25 ENCOUNTER — Ambulatory Visit: Payer: Medicare Other | Attending: Cardiology | Admitting: Cardiology

## 2023-09-25 ENCOUNTER — Encounter: Payer: Self-pay | Admitting: Cardiology

## 2023-09-25 DIAGNOSIS — E782 Mixed hyperlipidemia: Secondary | ICD-10-CM | POA: Diagnosis not present

## 2023-09-25 DIAGNOSIS — Z794 Long term (current) use of insulin: Secondary | ICD-10-CM

## 2023-09-25 DIAGNOSIS — I25118 Atherosclerotic heart disease of native coronary artery with other forms of angina pectoris: Secondary | ICD-10-CM | POA: Diagnosis not present

## 2023-09-25 DIAGNOSIS — E119 Type 2 diabetes mellitus without complications: Secondary | ICD-10-CM

## 2023-09-25 DIAGNOSIS — I10 Essential (primary) hypertension: Secondary | ICD-10-CM

## 2023-09-25 DIAGNOSIS — R079 Chest pain, unspecified: Secondary | ICD-10-CM

## 2023-09-25 MED ORDER — RANOLAZINE ER 500 MG PO TB12: 500.0000 mg | ORAL_TABLET | Freq: Two times a day (BID) | ORAL | 3 refills | Status: AC

## 2023-09-25 MED ORDER — NITROGLYCERIN 0.4 MG SL SUBL: 0.4000 mg | SUBLINGUAL_TABLET | SUBLINGUAL | 6 refills | Status: DC | PRN

## 2023-09-25 NOTE — Patient Instructions (Addendum)
 Medication Instructions:  Use nitroglycerin 1 tablet placed under the tongue at the first sign of chest pain or an angina attack. 1 tablet may be used every 5 minutes as needed, for up to 15 minutes. Do not take more than 3 tablets in 15 minutes. If pain persist call 911 or go to the nearest ED.   *If you need a refill on your cardiac medications before your next appointment, please call your pharmacy*   Lab Work: None ordered If you have labs (blood work) drawn today and your tests are completely normal, you will receive your results only by: MyChart Message (if you have MyChart) OR A paper copy in the mail If you have any lab test that is abnormal or we need to change your treatment, we will call you to review the results.   Testing/Procedures: You are scheduled for a Myocardial Perfusion Imaging Study.  Please arrive 15 minutes prior to your appointment time for registration and insurance purposes.  The test will take approximately 3 to 4 hours to complete; you may bring reading material.  If someone comes with you to your appointment, they will need to remain in the main lobby due to limited space in the testing area.   How to prepare for your Myocardial Perfusion Test: Do not eat or drink 3 hours prior to your test, except you may have water. Do not consume products containing caffeine (regular or decaffeinated) 12 hours prior to your test. (ex: coffee, chocolate, sodas, tea). Do not take metoprolol (Lopressor, Toprol) for 24 hours prior to the test.  Bring the medication to your appointment as you may be required to take it once the test is complete. Do wear comfortable clothes (no dresses or overalls) and walking shoes, tennis shoes preferred (No heels or open toe shoes are allowed). Do NOT wear cologne, perfume, aftershave, or lotions (deodorant is allowed). If these instructions are not followed, your test will have to be rescheduled.  If you cannot keep your appointment, please  provide 24 hours notification to the Nuclear Lab, to avoid a possible $50 charge to your account.  Follow-Up: At [facility], you and your health needs are our priority.  As part of our continuing mission to provide you with exceptional heart care, we have created designated Provider Care Teams.  These Care Teams include your primary Cardiologist (physician) and Advanced Practice Providers (APPs -  Physician Assistants and Nurse Practitioners) who all work together to provide you with the care you need, when you need it.  We recommend signing up for the patient portal called "MyChart".  Sign up information is provided on this After Visit Summary.  MyChart is used to connect with patients for Virtual Visits (Telemedicine).  Patients are able to view lab/test results, encounter notes, upcoming appointments, etc.  Non-urgent messages can be sent to your provider as well.   To learn more about what you can do with MyChart, go to [email or web address].    Your next appointment:   9 month follow up   Other Instructions  Cardiac Nuclear Scan A cardiac nuclear scan is a test that is done to check the flow of blood to your heart. It is done when you are resting and when you are exercising. The test looks for problems such as: Not enough blood reaching a portion of the heart. The heart muscle not working as it should. You may need this test if you have: Heart disease. Lab results that are not normal. Had heart  surgery or a balloon procedure to open up blocked arteries (angioplasty) or a small mesh tube (stent). Chest pain. Shortness of breath. Had a heart attack. In this test, a special dye (tracer) is put into your bloodstream. The tracer will travel to your heart. A camera will then take pictures of your heart to see how the tracer moves through your heart. This test is usually done at a hospital and takes 2-4 hours. Tell a doctor about: Any allergies you have. All medicines you are  taking, including vitamins, herbs, eye drops, creams, and over-the-counter medicines. Any bleeding problems you have. Any surgeries you have had. Any medical conditions you have. Whether you are pregnant or may be pregnant. Any history of asthma or long-term (chronic) lung disease. Any history of heart rhythm disorders or heart valve conditions. What are the risks? Your doctor will talk with you about risks. These may include: Serious chest pain and heart attack. This is only a risk if the stress portion of the test is done. Fast or uneven heartbeats (palpitations). A feeling of warmth in your chest. This feeling usually does not last long. Allergic reaction to the tracer. Shortness of breath or trouble breathing. What happens before the test? Ask your doctor about changing or stopping your normal medicines. Follow instructions from your doctor about what you cannot eat or drink. Remove your jewelry on the day of the test. Ask your doctor if you need to avoid nicotine or caffeine. What happens during the test? An IV tube will be inserted into one of your veins. Your doctor will give you a small amount of tracer through the IV tube. You will wait for 20-40 minutes while the tracer moves through your bloodstream. Your heart will be monitored with an electrocardiogram (ECG). You will lie down on an exam table. Pictures of your heart will be taken for about 15-20 minutes. You may also have a stress test. For this test, one of these things may be done: You will be asked to exercise on a treadmill or a stationary bike. You will be given medicines that will make your heart work harder. This is done if you are unable to exercise. When blood flow to your heart has peaked, a tracer will again be given through the IV tube. After 20-40 minutes, you will get back on the exam table. More pictures will be taken of your heart. Depending on the tracer that is used, more pictures may need to be taken  3-4 hours later. Your IV tube will be removed when the test is over. The test may vary among doctors and hospitals. What happens after the test? Ask your doctor: Whether you can return to your normal schedule, including diet, activities, travel, and medicines. Whether you should drink more fluids. This will help to remove the tracer from your body. Ask your doctor, or the department that is doing the test: When will my results be ready? How will I get my results? What are my treatment options? What other tests do I need? What are my next steps? This information is not intended to replace advice given to you by your health care provider. Make sure you discuss any questions you have with your health care provider. Document Revised: 12/11/2021 Document Reviewed: 12/11/2021 Elsevier Patient Education  2023 [employer].

## 2023-09-25 NOTE — Progress Notes (Signed)
 Cardiology Office Note:    Date:  09/25/2023   ID:  [name], DOB 1960-12-02, MRN [id number]  PCP:  [name], MD  Cardiologist:  Garwin Brothers, MD   Referring MD: [name], MD    ASSESSMENT:    1. Morbid obesity (HCC)   2. Coronary artery disease of native artery of native heart with stable angina pectoris (HCC)   3. Essential (primary) hypertension   4. Mixed hyperlipidemia   5. Type 2 diabetes mellitus without complication, with long-term current use of insulin (HCC)    PLAN:    In order of problems listed above:  Coronary artery disease: Secondary prevention stressed with the patient.  Importance of compliance with diet medication stressed any vocalized understanding. Chest pain: Atypical in nature but in view of risk factors and existing coronary artery disease we will do a Lexiscan sestamibi.  He is agreeable. Essential hypertension: Blood pressure is stable and diet was emphasized.  Lifestyle modification urged. Mixed dyslipidemia: On lipid-lowering medications followed by primary care.  Diet emphasized.  Triglycerides are elevated. Obesity and diabetes mellitus: Managed by primary care.  Lifestyle modification urged patient promises to do better. Patient will be seen in follow-up appointment in 6 months or earlier if the patient has any concerns.    Medication Adjustments/Labs and Tests Ordered: Current medicines are reviewed at length with the patient today.  Concerns regarding medicines are outlined above.  Orders Placed This Encounter  Procedures   EKG 12-Lead   No orders of the defined types were placed in this encounter.    No chief complaint on file.    History of Present Illness:    Kyle [name] is a 63 y.o. male.  Patient has past medical history of coronary artery disease, essential hypertension, mixed dyslipidemia, diabetes mellitus and morbid obesity.  He has amputation above-knee of the right lower extremity.  He denies any problems  at this time.  He occasionally has discomfort in the right shoulder and he wonders whether this is coming from the heart.  It does not occur on exertion or stress.  At the time of my evaluation, the patient is alert awake oriented and in no distress.  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 (HCC) 01/26/2009   Qualifier: Diagnosis of   By: Laury Deep SNOMED Dx Update Oct 2024     ATHEROSLERO NATV ART EXTREM W/INTERMIT CLAUDICAT 01/26/2009   Qualifier: Diagnosis of  By: Valinda Party RN, Nancy     Back pain of lumbar region with sciatica 01/31/2021   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   Benign prostatic hyperplasia with urinary frequency    CHEST PAIN UNSPECIFIED 01/26/2009   Qualifier: Diagnosis of  By: Valinda Party RN, Harriett Sine     Class 2 severe obesity due to excess calories with serious comorbidity and body mass index (BMI) of 38.0 to 38.9 in adult [facility])    CLAUDICATION 01/26/2009   Qualifier: Diagnosis of  By: Valinda Party RN, Nancy     Colonic mass 03/04/2022   Coronary artery disease    minimal CAD '12; NL stress test 06/10/14 ([facility])   Coronary artery disease of native artery of native heart with stable angina pectoris (HCC)  Degeneration of intervertebral disc of lumbosacral region 08/23/2015   Degenerative disc disease, lumbar    Degenerative lumbar disc 11/01/2020   Formatting of this note might be different from the original. Was recommended back surgery. Holding out.   Dehiscence of amputation stump of right lower extremity (HCC) 12/20/2022   Dehydration 01/26/2009   Qualifier: Diagnosis of  By: Valinda Party RN, Nancy     Depression    Diabetes (HCC) 11/01/2013   Diabetes mellitus (HCC) 04/03/2022   Diabetes mellitus without  complication (HCC)    DYSPNEA 01/26/2009   Qualifier: Diagnosis of  By: Valinda Party RN, Harriett Sine     Dysthymic disorder 01/26/2009   Qualifier: Diagnosis of  By: Valinda Party RN, Nancy     Elevated alkaline phosphatase level 11/27/2020   Epididymoorchitis 12/31/2020   Essential (primary) hypertension 08/23/2015   Family history of adverse reaction to anesthesia    "it made my son sick"   Gangrene of toe of right foot (HCC) 11/11/2022   Generalized abdominal pain 08/23/2015   GERD (gastroesophageal reflux disease)    History of arthroplasty of right knee    Hyperlipemia    Iron deficiency anemia due to chronic blood loss 11/07/2020   Malaise and fatigue 06/28/2020   Metatarsal deformity 11/01/2013   Mild intermittent asthma without complication    Mixed hyperlipidemia    Moderate episode of recurrent major depressive disorder (HCC)    Morbid obesity (HCC) 08/23/2015   Myocardial infarction (HCC)    2012; hospitalized [facility] for chest pain syndrome 02/2011 and LHC showed trivial CAD   Neuropathy    Noncompliance 05/30/2021   Pain in lower limb 12/07/2013   Plantar fasciitis of left foot 11/01/2013   Pneumonia 08/2016   surgery had to be rescheduled due to pneumonia   Primary insomnia 12/19/2021   Formatting of this note might be different from the original.  Avoid Rx   Primary osteoarthritis involving multiple joints 11/01/2020   Formatting of this note might be different from the original. both knees replaced.   Primary osteoarthritis of left knee 09/06/2016   Primary osteoarthritis of right knee 09/30/2015   Psoriasis    Sepsis, unspecified organism (HCC) 10/20/2015   Skin ulcer of right great toe (HCC) 01/07/2022   Status post partial colectomy 10/04/2022   Type 2 diabetes mellitus with diabetic polyneuropathy, with long-term current use of insulin (HCC) 04/03/2022   Type 2 diabetes mellitus with hyperglycemia, with long-term current use of insulin (HCC) 04/03/2022   Type 2 diabetes  mellitus without complication, with long-term current use of insulin (HCC) 11/01/2013   Type 2 diabetes mellitus without complications (HCC) 08/23/2015   Uncomplicated opioid dependence (HCC)    Vitamin B12 deficiency 11/02/2020    Past Surgical History:  Procedure Laterality Date   AMPUTATION Right 11/15/2022   Procedure: RIGHT TRANSMETATARSAL AMPUTATION;  Surgeon: Nadara Mustard, MD;  Location: [facility] OR;  Service: Orthopedics;  Laterality: Right;   AMPUTATION Right 12/20/2022   Procedure: RIGHT CHOPART AMPUTATION;  Surgeon: Nadara Mustard, MD;  Location: [facility] OR;  Service: Orthopedics;  Laterality: Right;   AMPUTATION Right 01/15/2023   Procedure: RIGHT BELOW KNEE AMPUTATION;  Surgeon: Nadara Mustard, MD;  Location: [facility] OR;  Service: Orthopedics;  Laterality: Right;   CARDIAC CATHETERIZATION     03/28/11 LHC: NL LM, LAD; 10% mCX, mRCA. EF 65%. (Dr. Dot Been, [facility])   CHOLECYSTECTOMY     CORONARY ANGIOPLASTY     2012 [facility]    ESOPHAGEAL DILATION  SEVERAL TIMES   Heel Spur Resection with Fasiotomy Left 11/25/2013   @ [facility]   KNEE ARTHROSCOPY Bilateral    SHOULDER ARTHROSCOPY WITH OPEN ROTATOR CUFF REPAIR Right    TOTAL KNEE ARTHROPLASTY Right 10/04/2015   Procedure: RIGHT TOTAL KNEE ARTHROPLASTY;  Surgeon: Gean Birchwood, MD;  Location: [facility] OR;  Service: Orthopedics;  Laterality: Right;   TOTAL KNEE ARTHROPLASTY Left 10/14/2016   TOTAL KNEE ARTHROPLASTY Left 10/14/2016   Procedure: LEFT TOTAL KNEE ARTHROPLASTY;  Surgeon: Gean Birchwood, MD;  Location: [facility] OR;  Service: Orthopedics;  Laterality: Left;   TUMOR REMOVAL     FATTY TUMOR  RT SHOULDER    Current Medications: Current Meds  Medication Sig   albuterol (VENTOLIN HFA) 108 (90 Base) MCG/ACT inhaler Inhale 2 puffs into the lungs every 6 (six) hours as needed for wheezing or shortness of breath.   aspirin 325 MG tablet Take 325 mg by mouth in the morning.   Buprenorphine HCl-Naloxone HCl 8-2 MG FILM Place 2 each under the tongue daily.    buPROPion (WELLBUTRIN) 100 MG tablet Take 100 mg by mouth in the morning.   busPIRone (BUSPAR) 10 MG tablet Take 10 mg by mouth in the morning.   Continuous Blood Gluc Sensor (DEXCOM G7 SENSOR) MISC 1 Device by Does not apply route as directed.   doxycycline (VIBRA-TABS) 100 MG tablet Take 1 tablet (100 mg total) by mouth 2 (two) times daily.   doxycycline (VIBRA-TABS) 100 MG tablet Take 1 tablet (100 mg total) by mouth 2 (two) times daily.   doxycycline (VIBRA-TABS) 100 MG tablet Take 1 tablet (100 mg total) by mouth 2 (two) times daily.   fenofibrate (TRICOR) 48 MG tablet Take 48 mg by mouth daily.   ferrous sulfate 325 (65 FE) MG tablet Take 325 mg by mouth daily with breakfast.   gabapentin (NEURONTIN) 300 MG capsule TAKE 1 CAPSULE BY MOUTH 4 TIMES A DAY   gabapentin (NEURONTIN) 400 MG capsule TAKE 1 CAPSULE BY MOUTH 3 TIMES DAILY.   glipiZIDE (GLUCOTROL) 10 MG tablet Take 10 mg by mouth 2 (two) times daily before a meal.   HUMALOG KWIKPEN 100 UNIT/ML KwikPen Inject 12 Units into the skin 3 (three) times daily before meals.   insulin glargine, 1 Unit Dial, (TOUJEO SOLOSTAR) 300 UNIT/ML Solostar Pen Inject 50 Units into the skin daily in the afternoon.   magnesium oxide (MAG-OX) 400 MG tablet Take 400 mg by mouth in the morning.   metFORMIN (GLUCOPHAGE) 500 MG tablet Take 2 tablets (1,000 mg total) by mouth 2 (two) times daily.   metoprolol succinate (TOPROL-XL) 25 MG 24 hr tablet Take 25 mg by mouth in the morning.   Multiple Vitamins-Minerals (CENTRUM SILVER 50+MEN) TABS Take 1 tablet by mouth in the morning.   mupirocin ointment (BACTROBAN) 2 % Apply 1 Application topically daily. Apply to the affected area 2 times a day   nitroGLYCERIN (NITROSTAT) 0.4 MG SL tablet Place 1 tablet (0.4 mg total) under the tongue every 5 (five) minutes as needed for chest pain.   omeprazole (PRILOSEC) 40 MG capsule Take 40 mg by mouth in the morning.   PARoxetine (PAXIL) 40 MG tablet Take 40 mg by mouth  daily.   rosuvastatin (CRESTOR) 10 MG tablet Take 10 mg by mouth in the morning.   tamsulosin (FLOMAX) 0.4 MG CAPS capsule Take 0.4 mg by mouth in the morning and at bedtime.   triamcinolone cream (KENALOG) 0.1 % Apply 1 application topically 2 (two) times daily  as needed for rash.   zolpidem (AMBIEN) 5 MG tablet Take 5 mg by mouth at bedtime as needed for sleep.   [DISCONTINUED] clonazePAM (KLONOPIN) 1 MG tablet Take 1 mg by mouth at bedtime.     Allergies:   Penicillins and Cymbalta [duloxetine hcl]   Social History   Socioeconomic History   Marital status: Married    Spouse name: Not on file   Number of children: Not on file   Years of education: Not on file   Highest education level: Not on file  Occupational History   Not on file  Tobacco Use   Smoking status: Never   Smokeless tobacco: Never  Vaping Use   Vaping status: Never Used  Substance and Sexual Activity   Alcohol use: No   Drug use: No   Sexual activity: Not on file  Other Topics Concern   Not on file  Social History Narrative   Not on file   Social Drivers of Health   Financial Resource Strain: Low Risk  (12/12/2021)   [facility] visits prior to 09/28/2022., [facility] visits prior to 09/28/2022.   Overall Financial Resource Strain (CARDIA)    Difficulty of Paying Living Expenses: Not hard at all  Food Insecurity: Low Risk  (09/16/2023)   [facility]   Hunger Vital Sign    Worried About Running Out of Food in the Last Year: Never true    Ran Out of Food in the Last Year: Never true  Transportation Needs: No Transportation Needs (09/16/2023)   Received from [facility]    In the past 12 months, has lack of reliable transportation kept you from medical appointments, meetings, work or from getting things needed for daily living? : No  Physical Activity: Sufficiently Active (12/12/2021)    [facility] from [facility] visits prior to 09/28/2022., [facility] visits prior to 09/28/2022.   Exercise Vital Sign    Days of Exercise per Week: 7 days    Minutes of Exercise per Session: 60 min  Stress: No Stress Concern Present (12/12/2021)   [facility], [facility] visits prior to 09/28/2022., [facility] visits prior to 09/28/2022.   [employer] of Occupational Health - Occupational Stress Questionnaire    Feeling of Stress : Not at all  Social Connections: Socially Integrated (12/12/2021)   [facility], [facility] prior to 09/28/2022., [facility], [facility] visits prior to 09/28/2022.   Social Connection and Isolation Panel [NHANES]    Frequency of Communication with Friends and Family: More than three times a week    Frequency of Social Gatherings with Friends and Family: Three times a week    Attends Religious Services: More than 4 times per year    Active Member of Clubs or Organizations: No    Attends Engineer, structural: More than 4 times per year    Marital Status: Married     Family History: The patient's family history includes Cancer in his mother; Diabetes in his mother; Heart failure in his brother and father; Hyperlipidemia in his father; Hypertension in his father.  ROS:   Please see the history of present illness.    All other systems reviewed and are negative.  EKGs/Labs/Other Studies Reviewed:  The following studies were reviewed today: .Marland KitchenEKG Interpretation Date/Time:  Thursday September 25 2023 13:55:24 EST Ventricular Rate:  94 PR Interval:  138 QRS Duration:  96 QT Interval:  338 QTC Calculation: 422 R Axis:   -15  Text Interpretation: Normal sinus rhythm Normal ECG When compared with ECG of 11-Nov-2022 23:37, PREVIOUS ECG IS  PRESENT Confirmed by Belva Crome [phone]) on 09/25/2023 2:17:22 PM     Recent Labs: 11/11/2022: ALT 13 11/15/2022: Magnesium 1.8 01/15/2023: BUN 19; Creatinine, Ser 1.11; Hemoglobin 10.0; Platelets 462; Potassium 4.5; Sodium 133  Recent Lipid Panel No results found for: "CHOL", "TRIG", "HDL", "CHOLHDL", "VLDL", "LDLCALC", "LDLDIRECT"  Physical Exam:    VS:  BP 130/80   Pulse 94   Ht 5\' 7"  (1.702 m)   Wt 249 lb 3.2 oz (113 kg)   SpO2 93%   BMI 39.03 kg/m     Wt Readings from Last 3 Encounters:  09/25/23 249 lb 3.2 oz (113 kg)  01/15/23 219 lb (99.3 kg)  12/20/22 220 lb (99.8 kg)     GEN: Patient is in no acute distress HEENT: Normal NECK: No JVD; No carotid bruits LYMPHATICS: No lymphadenopathy CARDIAC: Hear sounds regular, 2/6 systolic murmur at the apex. RESPIRATORY:  Clear to auscultation without rales, wheezing or rhonchi  ABDOMEN: Soft, non-tender, non-distended MUSCULOSKELETAL:  No edema; No deformity  SKIN: Warm and dry NEUROLOGIC:  Alert and oriented x 3 PSYCHIATRIC:  Normal affect   Signed, Garwin Brothers, MD  09/25/2023 2:19 PM    [facility]

## 2023-09-30 ENCOUNTER — Ambulatory Visit: Payer: Medicare Other

## 2023-10-07 ENCOUNTER — Telehealth: Payer: Self-pay

## 2023-10-07 NOTE — Telephone Encounter (Signed)
 Detailed instructions left on the patient's answering machine. Asked to call back with any questions.  S.Alessa Mazur CCT

## 2023-10-08 ENCOUNTER — Ambulatory Visit

## 2023-10-15 ENCOUNTER — Ambulatory Visit: Attending: Cardiology

## 2023-10-15 DIAGNOSIS — R079 Chest pain, unspecified: Secondary | ICD-10-CM | POA: Diagnosis not present

## 2023-10-15 MED ORDER — TECHNETIUM TC 99M TETROFOSMIN IV KIT
8.4000 | PACK | Freq: Once | INTRAVENOUS | Status: AC | PRN
  Administered 2023-10-15: 8.4 via INTRAVENOUS

## 2023-10-15 MED ORDER — REGADENOSON 0.4 MG/5ML IV SOLN
0.4000 mg | Freq: Once | INTRAVENOUS | Status: AC
  Administered 2023-10-15: 0.4 mg via INTRAVENOUS

## 2023-10-15 MED ORDER — TECHNETIUM TC 99M TETROFOSMIN IV KIT
23.1000 | PACK | Freq: Once | INTRAVENOUS | Status: AC | PRN
  Administered 2023-10-15: 23.1 via INTRAVENOUS

## 2023-10-16 LAB — MYOCARDIAL PERFUSION IMAGING
LV dias vol: 111 mL (ref 62–150)
LV sys vol: 44 mL
Nuc Stress EF: 60 %
Peak HR: 98 {beats}/min
Rest HR: 90 {beats}/min
Rest Nuclear Isotope Dose: 8.4 mCi
SDS: 2
SRS: 1
SSS: 3
ST Depression (mm): 0 mm
Stress Nuclear Isotope Dose: 23.1 mCi
TID: 0.97

## 2023-10-21 ENCOUNTER — Other Ambulatory Visit: Payer: Self-pay

## 2023-10-21 ENCOUNTER — Telehealth: Payer: Self-pay | Admitting: Cardiology

## 2023-10-21 NOTE — Telephone Encounter (Signed)
Patient is requesting a call back to discuss stress test results.

## 2023-10-21 NOTE — Telephone Encounter (Signed)
 Spoke with pt and advised of abnormal results. Pt verbalized understanding and had no additional questions. Appointment for 10/22/23.

## 2023-10-21 NOTE — Telephone Encounter (Signed)
 Called the patient and explained that the physician had not interpreted the results of his stress test at this time. I explained that I would send him a message explaining that the patient was asking about the results of his test. Patient verbalized understanding and had no further questions at this time.

## 2023-10-22 ENCOUNTER — Ambulatory Visit: Admitting: Cardiology

## 2023-10-22 ENCOUNTER — Encounter: Payer: Self-pay | Admitting: Cardiology

## 2023-10-22 ENCOUNTER — Other Ambulatory Visit (HOSPITAL_BASED_OUTPATIENT_CLINIC_OR_DEPARTMENT_OTHER): Payer: Self-pay

## 2023-10-22 ENCOUNTER — Ambulatory Visit: Attending: Cardiology | Admitting: Cardiology

## 2023-10-22 VITALS — BP 158/78 | HR 105 | Ht 68.0 in | Wt 246.8 lb

## 2023-10-22 DIAGNOSIS — I25118 Atherosclerotic heart disease of native coronary artery with other forms of angina pectoris: Secondary | ICD-10-CM | POA: Diagnosis not present

## 2023-10-22 DIAGNOSIS — R079 Chest pain, unspecified: Secondary | ICD-10-CM

## 2023-10-22 MED ORDER — IVABRADINE HCL 7.5 MG PO TABS
ORAL_TABLET | ORAL | 0 refills | Status: AC
  Filled 2023-10-22: qty 2, 1d supply, fill #0

## 2023-10-22 NOTE — Patient Instructions (Addendum)
 Medication Instructions:  Your physician recommends that you continue on your current medications as directed. Please refer to the Current Medication list given to you today.  You will have a one time dose of medication to take 2 hours before your CT, Corlanor 15 mg.  *If you need a refill on your cardiac medications before your next appointment, please call your pharmacy*   Lab Work: None ordered  If you have labs (blood work) drawn today and your tests are completely normal, you will receive your results only by: MyChart Message (if you have MyChart) OR A paper copy in the mail If you have any lab test that is abnormal or we need to change your treatment, we will call you to review the results.   Testing/Procedures:   Your cardiac CT will be scheduled at one of the below locations:   [facility] [address] [place], [place] [address] [phone]  If scheduled at [facility], please arrive at the [facility] and Children's Entrance (Entrance C2) of [facility] 30 minutes prior to test start time. You can use the FREE valet parking offered at entrance C (encouraged to control the heart rate for the test)  Proceed to the [facility] Radiology Department (first floor) to check-in and test prep.  All radiology patients and guests should use entrance C2 at [facility], accessed from [facility], even though the hospital's physical address listed is [address]     Please follow these instructions carefully (unless otherwise directed):  Hold all erectile dysfunction medications at least 3 days (72 hrs) prior to test. (Ie viagra, cialis, sildenafil, tadalafil, etc) We will administer nitroglycerin during this exam.   On the Night Before the Test: Be sure to Drink plenty of water. Do not consume any caffeinated/decaffeinated beverages or chocolate 12 hours prior to your test. Do not take any antihistamines 12 hours prior  to your test.   On the Day of the Test: Drink plenty of water until 1 hour prior to the test. Do not eat any food 1 hour prior to test. You may take your regular medications prior to the test.  Take Ivabradine (Corlanor) 15 mg (2 tablets)two hours prior to test.      After the Test: Drink plenty of water. After receiving IV contrast, you may experience a mild flushed feeling. This is normal. On occasion, you may experience a mild rash up to 24 hours after the test. This is not dangerous. If this occurs, you can take Benadryl 25 mg and increase your fluid intake. If you experience trouble breathing, this can be serious. If it is severe call 911 IMMEDIATELY. If it is mild, please call our office. If you take any of these medications: Glipizide/Metformin, Avandament, Glucavance, please do not take 48 hours after completing test unless otherwise instructed.  We will call to schedule your test 2-4 weeks out understanding that some insurance companies will need an authorization prior to the service being performed.   For non-scheduling related questions, please contact the cardiac imaging nurse navigator should you have any questions/concerns: Rockwell Alexandria, Cardiac Imaging Nurse Navigator Larey Brick, Cardiac Imaging Nurse Navigator [facility] Direct Office Dial: [phone]   For scheduling needs, including cancellations and rescheduling, please call [place], [phone].   Your next appointment:   9 month(s)  The format for your next appointment:   In Person  Provider:   Belva Crome, MD   Other  Instructions Cardiac CT Angiogram A cardiac CT angiogram is a procedure to look at the heart and the area around the heart. It may be done to help find the cause of chest pains or other symptoms of heart disease. During this procedure, a substance called contrast dye is injected into the blood vessels in the area to be checked. A large X-ray machine,  called a CT scanner, then takes detailed pictures of the heart and the surrounding area. The procedure is also sometimes called a coronary CT angiogram, coronary artery scanning, or CTA. A cardiac CT angiogram allows the health care provider to see how well blood is flowing to and from the heart. The health care provider will be able to see if there are any problems, such as: Blockage or narrowing of the coronary arteries in the heart. Fluid around the heart. Signs of weakness or disease in the muscles, valves, and tissues of the heart. Tell a health care provider about: Any allergies you have. This is especially important if you have had a previous allergic reaction to contrast dye. All medicines you are taking, including vitamins, herbs, eye drops, creams, and over-the-counter medicines. Any blood disorders you have. Any surgeries you have had. Any medical conditions you have. Whether you are pregnant or may be pregnant. Any anxiety disorders, chronic pain, or other conditions you have that may increase your stress or prevent you from lying still. What are the risks? Generally, this is a safe procedure. However, problems may occur, including: Bleeding. Infection. Allergic reactions to medicines or dyes. Damage to other structures or organs. Kidney damage from the contrast dye that is used. Increased risk of cancer from radiation exposure. This risk is low. Talk with your health care provider about: The risks and benefits of testing. How you can receive the lowest dose of radiation. What happens before the procedure? Wear comfortable clothing and remove any jewelry, glasses, dentures, and hearing aids. Follow instructions from your health care provider about eating and drinking. This may include: For 12 hours before the procedure -- avoid caffeine. This includes tea, coffee, soda, energy drinks, and diet pills. Drink plenty of water or other fluids that do not have caffeine in them. Being  well hydrated can prevent complications. For 4-6 hours before the procedure -- stop eating and drinking. The contrast dye can cause nausea, but this is less likely if your stomach is empty. Ask your health care provider about changing or stopping your regular medicines. This is especially important if you are taking diabetes medicines, blood thinners, or medicines to treat problems with erections (erectile dysfunction). What happens during the procedure?  Hair on your chest may need to be removed so that small sticky patches called electrodes can be placed on your chest. These will transmit information that helps to monitor your heart during the procedure. An IV will be inserted into one of your veins. You might be given a medicine to control your heart rate during the procedure. This will help to ensure that good images are obtained. You will be asked to lie on an exam table. This table will slide in and out of the CT machine during the procedure. Contrast dye will be injected into the IV. You might feel warm, or you may get a metallic taste in your mouth. You will be given a medicine called nitroglycerin. This will relax or dilate the arteries in your heart. The table that you are lying on will move into the CT machine tunnel for the  scan. The person running the machine will give you instructions while the scans are being done. You may be asked to: Keep your arms above your head. Hold your breath. Stay very still, even if the table is moving. When the scanning is complete, you will be moved out of the machine. The IV will be removed. The procedure may vary among health care providers and hospitals. What can I expect after the procedure? After your procedure, it is common to have: A metallic taste in your mouth from the contrast dye. A feeling of warmth. A headache from the nitroglycerin. Follow these instructions at home: Take over-the-counter and prescription medicines only as told by  your health care provider. If you are told, drink enough fluid to keep your urine pale yellow. This will help to flush the contrast dye out of your body. Most people can return to their normal activities right after the procedure. Ask your health care provider what activities are safe for you. It is up to you to get the results of your procedure. Ask your health care provider, or the department that is doing the procedure, when your results will be ready. Keep all follow-up visits as told by your health care provider. This is important. Contact a health care provider if: You have any symptoms of allergy to the contrast dye. These include: Shortness of breath. Rash or hives. A racing heartbeat. Summary A cardiac CT angiogram is a procedure to look at the heart and the area around the heart. It may be done to help find the cause of chest pains or other symptoms of heart disease. During this procedure, a large X-ray machine, called a CT scanner, takes detailed pictures of the heart and the surrounding area after a contrast dye has been injected into blood vessels in the area. Ask your health care provider about changing or stopping your regular medicines before the procedure. This is especially important if you are taking diabetes medicines, blood thinners, or medicines to treat erectile dysfunction. If you are told, drink enough fluid to keep your urine pale yellow. This will help to flush the contrast dye out of your body. This information is not intended to replace advice given to you by your health care provider. Make sure you discuss any questions you have with your health care provider. Document Revised: 03/10/2019 Document Reviewed: 03/10/2019 Elsevier Patient Education  2020 [employer].

## 2023-10-22 NOTE — Progress Notes (Signed)
 Cardiology Office Note:    Date:  10/22/2023   ID:  Opie Maclaughlin, DOB [birth year], MRN [id number]  PCP:  [name], MD  Cardiologist:  Garwin Brothers, MD   Referring MD: [name], MD    ASSESSMENT:    1. Coronary artery disease of native artery of native heart with stable angina pectoris (HCC)    PLAN:    In order of problems listed above:  Abnormal nuclear stress test: Atherosclerotic vascular disease: I discussed my findings with the patient at extensive length.  He has multiple risk factors for coronary artery disease and I discussed CT coronary angiography and conventional coronary angiography.  Benefits and potential risks were explained and questions were answered to satisfaction.  He is agreeable with the CT coronary angiography and we will set him up for this.  Again benefits risks and radiocontrast and its attendant risks were also discussed at length and questions were answered to satisfaction.  He was advised to take a coated baby aspirin on a daily basis.  Sublingual nitroglycerin prescription was sent, its protocol and 911 protocol explained and the patient vocalized understanding questions were answered to the patient's satisfaction Essential hypertension: Blood pressure is elevated but he tells me that he checks it meticulously at home and his blood pressure is in the range of 100/70.  Therefore I will not push any medications because of my worry about diabetic neuropathy and diabetic vascular issues.  Also he has prosthetic below-knee prosthesis and I am worried about falling. Diabetes mellitus and mixed dyslipidemia: Followed by primary care.  Goal LDL must be less than 60.  His triglycerides are elevated and I discussed diet with him. Obesity: Weight reduction stressed and he promises to do better.  Risks of obesity explained. Patient will be seen in follow-up appointment in 6 months or earlier if the patient has any concerns.    Medication Adjustments/Labs  and Tests Ordered: Current medicines are reviewed at length with the patient today.  Concerns regarding medicines are outlined above.  Orders Placed This Encounter  Procedures   EKG 12-Lead   No orders of the defined types were placed in this encounter.    No chief complaint on file.    History of Present Illness:    Gaither Biehn is a 63 y.o. male.  Patient has past medical history of vascular atherosclerosis, essential hypertension, mixed dyslipidemia and diabetes mellitus.  He has right below-knee amputation.  He denies any problems at this time.  He has some dyspnea on exertion for which she underwent nuclear stress testing and this was abnormal.  At the time of my evaluation, the patient is alert awake oriented and in no distress.  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 (HCC) 01/26/2009   Qualifier: Diagnosis of   By: Laury Deep SNOMED Dx Update Oct 2024     ATHEROSLERO NATV ART EXTREM W/INTERMIT CLAUDICAT 01/26/2009   Qualifier: Diagnosis of  By: Valinda Party RN, Nancy     Back pain of lumbar region with sciatica 01/31/2021   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   Benign prostatic hyperplasia with urinary frequency    CHEST PAIN UNSPECIFIED 01/26/2009   Qualifier: Diagnosis of  By:  Terbeck, RN, Harriett Sine     Class 2 severe obesity due to excess calories with serious comorbidity and body mass index (BMI) of 38.0 to 38.9 in adult [facility])    CLAUDICATION 01/26/2009   Qualifier: Diagnosis of  By: Valinda Party RN, Nancy     Colonic mass 03/04/2022   Coronary artery disease    minimal CAD '12; NL stress test 06/10/14 ([facility])   Coronary artery disease of native artery of native heart with stable angina pectoris (HCC)    Degeneration of  intervertebral disc of lumbosacral region 08/23/2015   Degenerative disc disease, lumbar    Degenerative lumbar disc 11/01/2020   Formatting of this note might be different from the original. Was recommended back surgery. Holding out.   Dehiscence of amputation stump of right lower extremity (HCC) 12/20/2022   Dehydration 01/26/2009   Qualifier: Diagnosis of  By: Valinda Party RN, Nancy     Depression    Diabetes (HCC) 11/01/2013   Diabetes mellitus (HCC) 04/03/2022   Diabetes mellitus without complication (HCC)    DYSPNEA 01/26/2009   Qualifier: Diagnosis of  By: Valinda Party RN, Harriett Sine     Dysthymic disorder 01/26/2009   Qualifier: Diagnosis of  By: Valinda Party RN, Nancy     Elevated alkaline phosphatase level 11/27/2020   Epididymoorchitis 12/31/2020   Essential (primary) hypertension 08/23/2015   Family history of adverse reaction to anesthesia    "it made my son sick"   Gangrene of toe of right foot (HCC) 11/11/2022   Generalized abdominal pain 08/23/2015   GERD (gastroesophageal reflux disease)    History of arthroplasty of right knee    Hyperlipemia    Iron deficiency anemia due to chronic blood loss 11/07/2020   Malaise and fatigue 06/28/2020   Metatarsal deformity 11/01/2013   Mild intermittent asthma without complication    Mixed hyperlipidemia    Moderate episode of recurrent major depressive disorder (HCC)    Morbid obesity (HCC) 08/23/2015   Myocardial infarction (HCC)    2012; hospitalized [facility] for chest pain syndrome 02/2011 and LHC showed trivial CAD   Neuropathy    Noncompliance 05/30/2021   Pain in lower limb 12/07/2013   Plantar fasciitis of left foot 11/01/2013   Pneumonia 08/2016   surgery had to be rescheduled due to pneumonia   Primary insomnia 12/19/2021   Formatting of this note might be different from the original.  Avoid Rx   Primary osteoarthritis involving multiple joints 11/01/2020   Formatting of this note might be different from the original. both knees  replaced.   Primary osteoarthritis of left knee 09/06/2016   Primary osteoarthritis of right knee 09/30/2015   Psoriasis    Sepsis, unspecified organism (HCC) 10/20/2015   Skin ulcer of right great toe (HCC) 01/07/2022   Status post partial colectomy 10/04/2022   Type 2 diabetes mellitus with diabetic polyneuropathy, with long-term current use of insulin (HCC) 04/03/2022   Type 2 diabetes mellitus with hyperglycemia, with long-term current use of insulin (HCC) 04/03/2022   Type 2 diabetes mellitus without complication, with long-term current use of insulin (HCC) 11/01/2013   Type 2 diabetes mellitus without complications (HCC) 08/23/2015   Uncomplicated opioid dependence (HCC)    Vitamin B12 deficiency 11/02/2020    Past Surgical History:  Procedure Laterality Date   AMPUTATION Right 11/15/2022   Procedure: RIGHT TRANSMETATARSAL AMPUTATION;  Surgeon: Nadara Mustard, MD;  Location: [facility] OR;  Service: Orthopedics;  Laterality: Right;   AMPUTATION Right 12/20/2022   Procedure: RIGHT CHOPART AMPUTATION;  Surgeon: Nadara Mustard, MD;  Location: [facility] OR;  Service: Orthopedics;  Laterality: Right;   AMPUTATION Right 01/15/2023   Procedure: RIGHT BELOW KNEE AMPUTATION;  Surgeon: Nadara Mustard, MD;  Location: [facility] OR;  Service: Orthopedics;  Laterality: Right;   CARDIAC CATHETERIZATION     03/28/11 LHC: NL LM, LAD; 10% mCX, mRCA. EF 65%. (Dr. Dot Been, [facility])   CHOLECYSTECTOMY     CORONARY ANGIOPLASTY     2012 [facility]    ESOPHAGEAL DILATION     SEVERAL TIMES   Heel Spur Resection with Fasiotomy Left 11/25/2013   @ [facility]   KNEE ARTHROSCOPY Bilateral    SHOULDER ARTHROSCOPY WITH OPEN ROTATOR CUFF REPAIR Right    TOTAL KNEE ARTHROPLASTY Right 10/04/2015   Procedure: RIGHT TOTAL KNEE ARTHROPLASTY;  Surgeon: Gean Birchwood, MD;  Location: [facility] OR;  Service: Orthopedics;  Laterality: Right;   TOTAL KNEE ARTHROPLASTY Left 10/14/2016   TOTAL KNEE ARTHROPLASTY Left 10/14/2016   Procedure: LEFT TOTAL KNEE  ARTHROPLASTY;  Surgeon: Gean Birchwood, MD;  Location: [facility] OR;  Service: Orthopedics;  Laterality: Left;   TUMOR REMOVAL     FATTY TUMOR  RT SHOULDER    Current Medications: Current Meds  Medication Sig   aspirin 325 MG tablet Take 325 mg by mouth in the morning.   Buprenorphine HCl-Naloxone HCl 8-2 MG FILM Place 2 each under the tongue daily.   buPROPion (WELLBUTRIN) 100 MG tablet Take 100 mg by mouth in the morning.   busPIRone (BUSPAR) 10 MG tablet Take 10 mg by mouth in the morning.   Continuous Blood Gluc Sensor (DEXCOM G7 SENSOR) MISC 1 Device by Does not apply route as directed.   doxycycline (VIBRA-TABS) 100 MG tablet Take 1 tablet (100 mg total) by mouth 2 (two) times daily.   doxycycline (VIBRA-TABS) 100 MG tablet Take 1 tablet (100 mg total) by mouth 2 (two) times daily.   doxycycline (VIBRA-TABS) 100 MG tablet Take 1 tablet (100 mg total) by mouth 2 (two) times daily.   fenofibrate (TRICOR) 48 MG tablet Take 48 mg by mouth daily.   ferrous sulfate 325 (65 FE) MG tablet Take 325 mg by mouth daily with breakfast.   gabapentin (NEURONTIN) 300 MG capsule TAKE 1 CAPSULE BY MOUTH 4 TIMES A DAY   gabapentin (NEURONTIN) 400 MG capsule TAKE 1 CAPSULE BY MOUTH 3 TIMES DAILY.   glipiZIDE (GLUCOTROL) 10 MG tablet Take 10 mg by mouth 2 (two) times daily before a meal.   HUMALOG KWIKPEN 100 UNIT/ML KwikPen Inject 12 Units into the skin 3 (three) times daily before meals.   insulin glargine, 1 Unit Dial, (TOUJEO SOLOSTAR) 300 UNIT/ML Solostar Pen Inject 50 Units into the skin daily in the afternoon.   magnesium oxide (MAG-OX) 400 MG tablet Take 400 mg by mouth in the morning.   metFORMIN (GLUCOPHAGE) 500 MG tablet Take 2 tablets (1,000 mg total) by mouth 2 (two) times daily.   metoprolol succinate (TOPROL-XL) 25 MG 24 hr tablet Take 25 mg by mouth in the morning.   Multiple Vitamins-Minerals (CENTRUM SILVER 50+MEN) TABS Take 1 tablet by mouth in the morning.   mupirocin ointment (BACTROBAN) 2 %  Apply 1 Application topically daily. Apply to the affected area 2 times a day   nitroGLYCERIN (NITROSTAT) 0.4 MG SL tablet Place 0.4 mg under the tongue every 5 (five) minutes as needed for chest pain.   omeprazole (PRILOSEC) 40 MG capsule Take 40 mg by mouth in the morning.   PARoxetine (  PAXIL) 40 MG tablet Take 40 mg by mouth daily.   ranolazine (RANEXA) 500 MG 12 hr tablet Take 1 tablet (500 mg total) by mouth 2 (two) times daily.   rosuvastatin (CRESTOR) 10 MG tablet Take 10 mg by mouth in the morning.   tamsulosin (FLOMAX) 0.4 MG CAPS capsule Take 0.4 mg by mouth in the morning and at bedtime.   triamcinolone cream (KENALOG) 0.1 % Apply 1 application topically 2 (two) times daily as needed for rash.   zolpidem (AMBIEN) 5 MG tablet Take 5 mg by mouth at bedtime as needed for sleep.     Allergies:   Penicillins and Cymbalta [duloxetine hcl]   Social History   Socioeconomic History   Marital status: Married    Spouse name: Not on file   Number of children: Not on file   Years of education: Not on file   Highest education level: Not on file  Occupational History   Not on file  Tobacco Use   Smoking status: Never   Smokeless tobacco: Never  Vaping Use   Vaping status: Never Used  Substance and Sexual Activity   Alcohol use: No   Drug use: No   Sexual activity: Not on file  Other Topics Concern   Not on file  Social History Narrative   Not on file   Social Drivers of Health   Financial Resource Strain: Low Risk  (12/12/2021)   [facility], [facility] prior to 09/28/2022[facility] visits prior to 09/28/2022.   Overall Financial Resource Strain (CARDIA)    Difficulty of Paying Living Expenses: Not hard at all  Food Insecurity: Low Risk  (09/16/2023)   [facility]   Hunger Vital Sign    Worried About Running Out of Food in the Last Year: Never true    Ran Out of Food in the Last  Year: Never true  Transportation Needs: No Transportation Needs (09/16/2023)   Received from [employer]    In the past 12 months, has lack of reliable transportation kept you from medical appointments, meetings, work or from getting things needed for daily living? : No  Physical Activity: Sufficiently Active (12/12/2021)   [facility] prior to 09/28/2022[facility] visits prior to 09/28/2022.   Exercise Vital Sign    Days of Exercise per Week: 7 days    Minutes of Exercise per Session: 60 min  Stress: No Stress Concern Present (12/12/2021)   Received from [facility], [facility] visits prior to 09/28/2022., [facility], [facility] visits prior to 09/28/2022.   [employer] of Occupational Health - Occupational Stress Questionnaire    Feeling of Stress : Not at all  Social Connections: Socially Integrated (12/12/2021)   [facility], [facility] visits prior to 09/28/2022., [facility], [facility] visits prior to 09/28/2022.   Social Connection and Isolation Panel [NHANES]    Frequency of Communication with Friends and Family: More than three times a week    Frequency of Social Gatherings with Friends and Family: Three times a week    Attends Religious Services: More than 4 times per year    Active Member of Clubs or Organizations: No    Attends Banker Meetings: More than 4 times per year  Marital Status: Married     Family History: The patient's family history includes Cancer in his mother; Diabetes in his mother; Heart failure in his brother and father; Hyperlipidemia in his father; Hypertension in his father.  ROS:   Please see the history of present illness.    All other systems reviewed and are negative.  EKGs/Labs/Other Studies Reviewed:    The  following studies were reviewed today: .Marland KitchenEKG Interpretation Date/Time:  Wednesday October 22 2023 13:20:55 EDT Ventricular Rate:  105 PR Interval:  148 QRS Duration:  102 QT Interval:  338 QTC Calculation: 446 R Axis:   -15  Text Interpretation: Sinus tachycardia Otherwise normal ECG When compared with ECG of 25-Sep-2023 13:55, No significant change was found Confirmed by Belva Crome [phone]) on 10/22/2023 1:34:24 PM     Recent Labs: 11/11/2022: ALT 13 11/15/2022: Magnesium 1.8 01/15/2023: BUN 19; Creatinine, Ser 1.11; Hemoglobin 10.0; Platelets 462; Potassium 4.5; Sodium 133  Recent Lipid Panel No results found for: "CHOL", "TRIG", "HDL", "CHOLHDL", "VLDL", "LDLCALC", "LDLDIRECT"  Physical Exam:    VS:  BP (!) 158/78   Pulse (!) 105   Ht 5\' 8"  (1.727 m)   Wt 246 lb 12.8 oz (111.9 kg)   SpO2 92%   BMI 37.53 kg/m     Wt Readings from Last 3 Encounters:  10/22/23 246 lb 12.8 oz (111.9 kg)  10/15/23 249 lb (112.9 kg)  09/25/23 249 lb 3.2 oz (113 kg)     GEN: Patient is in no acute distress HEENT: Normal NECK: No JVD; No carotid bruits LYMPHATICS: No lymphadenopathy CARDIAC: Hear sounds regular, 2/6 systolic murmur at the apex. RESPIRATORY:  Clear to auscultation without rales, wheezing or rhonchi  ABDOMEN: Soft, non-tender, non-distended MUSCULOSKELETAL:  No edema; No deformity  SKIN: Warm and dry NEUROLOGIC:  Alert and oriented x 3 PSYCHIATRIC:  Normal affect   Signed, Garwin Brothers, MD  10/22/2023 1:39 PM    [facility]

## 2023-11-06 ENCOUNTER — Other Ambulatory Visit: Payer: Self-pay | Admitting: Orthopedic Surgery

## 2023-11-18 ENCOUNTER — Telehealth (HOSPITAL_COMMUNITY): Payer: Self-pay | Admitting: *Deleted

## 2023-11-18 NOTE — Telephone Encounter (Signed)
 Reaching out to patient to offer assistance regarding upcoming cardiac imaging study; pt verbalizes understanding of appt date/time, parking situation and where to check in, pre-test NPO status and medications ordered, and verified current allergies; name and call back number provided for further questions should they arise Johney Frame RN Navigator Cardiac Imaging [facility] [phone] office [phone] cell

## 2023-11-19 ENCOUNTER — Ambulatory Visit (HOSPITAL_COMMUNITY)
Admission: RE | Admit: 2023-11-19 | Discharge: 2023-11-19 | Disposition: A | Discharge status: Home / Self Care | Source: Ambulatory Visit | Attending: Cardiology | Admitting: Cardiology

## 2023-11-19 ENCOUNTER — Ambulatory Visit (HOSPITAL_BASED_OUTPATIENT_CLINIC_OR_DEPARTMENT_OTHER)
Admission: RE | Admit: 2023-11-19 | Discharge: 2023-11-19 | Disposition: A | Discharge status: Home / Self Care | Source: Ambulatory Visit | Attending: Cardiovascular Disease

## 2023-11-19 ENCOUNTER — Other Ambulatory Visit: Payer: Self-pay | Admitting: Cardiovascular Disease

## 2023-11-19 DIAGNOSIS — R931 Abnormal findings on diagnostic imaging of heart and coronary circulation: Secondary | ICD-10-CM

## 2023-11-19 DIAGNOSIS — I25118 Atherosclerotic heart disease of native coronary artery with other forms of angina pectoris: Secondary | ICD-10-CM | POA: Diagnosis present

## 2023-11-19 DIAGNOSIS — R079 Chest pain, unspecified: Secondary | ICD-10-CM | POA: Diagnosis present

## 2023-11-19 MED ORDER — IOHEXOL 350 MG/ML SOLN
100.0000 mL | Freq: Once | INTRAVENOUS | Status: AC | PRN
  Administered 2023-11-19: 100 mL via INTRAVENOUS

## 2023-11-19 MED ORDER — NITROGLYCERIN 0.4 MG SL SUBL
0.8000 mg | SUBLINGUAL_TABLET | Freq: Once | SUBLINGUAL | Status: AC
  Administered 2023-11-19: 0.8 mg via SUBLINGUAL

## 2023-11-19 MED ORDER — METOPROLOL TARTRATE 5 MG/5ML IV SOLN: 10.0000 mg | Freq: Once | INTRAVENOUS | Status: DC | PRN

## 2023-11-19 MED ORDER — NITROGLYCERIN 0.4 MG SL SUBL
SUBLINGUAL_TABLET | SUBLINGUAL | Status: AC
  Filled 2023-11-19: qty 2

## 2023-11-19 MED ORDER — DILTIAZEM HCL 25 MG/5ML IV SOLN
10.0000 mg | INTRAVENOUS | Status: DC | PRN
Start: 2023-11-19 — End: 2023-11-20

## 2023-11-19 NOTE — Progress Notes (Signed)
 CT FFR ordered.  Kyle Spruce T. Rolm Clos, MD, [facility]  [facility]  [address] [place], [place] [address] [phone]  3:09 PM

## 2023-11-20 ENCOUNTER — Telehealth: Payer: Self-pay

## 2023-11-20 DIAGNOSIS — I25118 Atherosclerotic heart disease of native coronary artery with other forms of angina pectoris: Secondary | ICD-10-CM

## 2023-11-20 DIAGNOSIS — E782 Mixed hyperlipidemia: Secondary | ICD-10-CM

## 2023-11-20 NOTE — Telephone Encounter (Signed)
**Note De-identified  Obfuscation** Left vm and mychart message

## 2023-11-20 NOTE — Telephone Encounter (Signed)
-----   Message from Micael Adas Revankar sent at 11/19/2023 10:26 PM EDT ----- No obstructive cad. Get him in  for LL profile in 2 mo. Diet and exercise.ecasa. cc pcp Nelia Balzarine, MD 11/19/2023 10:25 PM

## 2023-11-20 NOTE — Telephone Encounter (Signed)
 Pt returning call

## 2023-11-21 NOTE — Telephone Encounter (Signed)
Pt returning call in regards to results. Please advise 

## 2023-11-24 ENCOUNTER — Telehealth: Payer: Self-pay

## 2023-11-24 LAB — POCT I-STAT CREATININE: Creatinine, Ser: 1.5 mg/dL — ABNORMAL HIGH (ref 0.61–1.24)

## 2023-11-24 NOTE — Telephone Encounter (Signed)
 Left voicemail to return call.

## 2023-11-24 NOTE — Telephone Encounter (Signed)
 Left vm

## 2023-11-24 NOTE — Telephone Encounter (Signed)
Results reviewed with pt as per Dr. Revankar's note.  Pt verbalized understanding and had no additional questions. Routed to PCP.  

## 2023-11-24 NOTE — Telephone Encounter (Signed)
 Follow Up:     Patient is calling  back to see if his test results are ready please

## 2023-11-27 LAB — COMPREHENSIVE METABOLIC PANEL WITH GFR
ALT: 16 IU/L (ref 0–44)
AST: 29 IU/L (ref 0–40)
Albumin: 4.4 g/dL (ref 3.9–4.9)
Alkaline Phosphatase: 110 IU/L (ref 44–121)
BUN/Creatinine Ratio: 12 (ref 10–24)
BUN: 14 mg/dL (ref 8–27)
Bilirubin Total: 0.4 mg/dL (ref 0.0–1.2)
CO2: 27 mmol/L (ref 20–29)
Calcium: 9.7 mg/dL (ref 8.6–10.2)
Chloride: 96 mmol/L (ref 96–106)
Creatinine, Ser: 1.21 mg/dL (ref 0.76–1.27)
Globulin, Total: 3.1 g/dL (ref 1.5–4.5)
Glucose: 176 mg/dL — ABNORMAL HIGH (ref 70–99)
Potassium: 5 mmol/L (ref 3.5–5.2)
Sodium: 139 mmol/L (ref 134–144)
Total Protein: 7.5 g/dL (ref 6.0–8.5)
eGFR: 68 mL/min/{1.73_m2} (ref >59)

## 2023-11-27 LAB — LIPID PANEL
Chol/HDL Ratio: 3.7 ratio (ref 0.0–5.0)
Cholesterol, Total: 156 mg/dL (ref 100–199)
HDL: 42 mg/dL (ref >39)
LDL Chol Calc (NIH): 73 mg/dL (ref 0–99)
Triglycerides: 250 mg/dL — ABNORMAL HIGH (ref 0–149)
VLDL Cholesterol Cal: 41 mg/dL — ABNORMAL HIGH (ref 5–40)

## 2023-12-23 ENCOUNTER — Encounter (HOSPITAL_BASED_OUTPATIENT_CLINIC_OR_DEPARTMENT_OTHER): Payer: Self-pay | Admitting: Emergency Medicine

## 2023-12-23 ENCOUNTER — Ambulatory Visit (HOSPITAL_BASED_OUTPATIENT_CLINIC_OR_DEPARTMENT_OTHER)
Admit: 2023-12-23 | Discharge: 2023-12-23 | Disposition: A | Discharge status: Home / Self Care | Attending: Family Medicine | Admitting: Radiology

## 2023-12-23 ENCOUNTER — Ambulatory Visit (HOSPITAL_BASED_OUTPATIENT_CLINIC_OR_DEPARTMENT_OTHER): Payer: Self-pay | Admitting: Family Medicine

## 2023-12-23 ENCOUNTER — Ambulatory Visit (HOSPITAL_BASED_OUTPATIENT_CLINIC_OR_DEPARTMENT_OTHER)
Admission: EM | Admit: 2023-12-23 | Discharge: 2023-12-23 | Disposition: A | Discharge status: Home / Self Care | Attending: Family Medicine | Admitting: Family Medicine

## 2023-12-23 ENCOUNTER — Telehealth: Payer: Self-pay | Admitting: Orthopedic Surgery

## 2023-12-23 DIAGNOSIS — N401 Enlarged prostate with lower urinary tract symptoms: Secondary | ICD-10-CM | POA: Insufficient documentation

## 2023-12-23 DIAGNOSIS — R109 Unspecified abdominal pain: Secondary | ICD-10-CM | POA: Insufficient documentation

## 2023-12-23 DIAGNOSIS — R3911 Hesitancy of micturition: Secondary | ICD-10-CM | POA: Insufficient documentation

## 2023-12-23 DIAGNOSIS — R1012 Left upper quadrant pain: Secondary | ICD-10-CM | POA: Insufficient documentation

## 2023-12-23 DIAGNOSIS — R1011 Right upper quadrant pain: Secondary | ICD-10-CM | POA: Diagnosis present

## 2023-12-23 DIAGNOSIS — N390 Urinary tract infection, site not specified: Secondary | ICD-10-CM | POA: Insufficient documentation

## 2023-12-23 LAB — POCT URINALYSIS DIP (MANUAL ENTRY)
Blood, UA: NEGATIVE
Glucose, UA: >=1000 mg/dL — AB
Leukocytes, UA: NEGATIVE
Nitrite, UA: POSITIVE — AB
Protein Ur, POC: 100 mg/dL — AB
Spec Grav, UA: 1.02
Urobilinogen, UA: 2 U/dL — AB
pH, UA: 5.5

## 2023-12-23 MED ORDER — NITROFURANTOIN MONOHYD MACRO 100 MG PO CAPS: 100.0000 mg | ORAL_CAPSULE | Freq: Two times a day (BID) | ORAL | 0 refills | Status: AC

## 2023-12-23 NOTE — Telephone Encounter (Signed)
 Patient called and said that his left toe has blisters and don't want to heal. Its now a sore. KG#[phone] He needs an appointment

## 2023-12-23 NOTE — Progress Notes (Signed)
 Essentially normal abdominal film.  No stones.  Patient was updated during his visit.  No change in the plan of care.

## 2023-12-23 NOTE — Discharge Instructions (Addendum)
 Urinary hesitancy: Patient is on tamsulosin  0.4 mg twice daily.  If he is having hesitancy and difficulty starting his urine stream, he may have worsening BPH and should see urology.  Advised him to speak to his family doctor about referral to urology.  Urinalysis shows acute UTI.  Nitrofurantoin  100 mg twice daily for 7 days.  Will adjust the plan of care, if needed once the culture results.  He is having lower back pain left flank pain and upper abdominal pain: Abdominal x-ray is essentially normal or negative.  No stones seen and he does not have a significant stool burden.  May use over-the-counter acetaminophen  or ibuprofen for pain.  Follow-up with primary care as you probably need referral to urology.  Follow-up here if needed.

## 2023-12-23 NOTE — ED Triage Notes (Signed)
 Pt reports left flank pain that started 2 days ago, urinary frequency and burning.

## 2023-12-23 NOTE — ED Provider Notes (Signed)
 [name]    CSN: [id number] Arrival date & time: 12/23/23  1646      History   Chief Complaint Chief Complaint  Patient presents with   Urinary Frequency    HPI Kyle [name] is a 63 y.o. male.   Patient ports left flank pain that started on 12/21/2023, he has burning and frequency of urination.  He has urinary hesitancy and says it takes a while to get his urination started.  He does not see a urologist.  He is on tamsulosin  for BPH.  He has not personally had a kidney stone but he has a strong family history of kidney stones.   Urinary Frequency Associated symptoms include abdominal pain. Pertinent negatives include no chest pain.    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   Benign prostatic hyperplasia with urinary frequency    CHEST PAIN UNSPECIFIED 01/26/2009   Qualifier: Diagnosis of  By: Allyn Ivy RN, Haskell Linker     Class 2 severe obesity due to excess calories with serious comorbidity and body mass index (BMI) of 38.0 to 38.9 in adult [facility])    CLAUDICATION 01/26/2009   Qualifier: Diagnosis of  By: Allyn Ivy RN, Nancy     Colonic mass 03/04/2022   Coronary artery disease    minimal CAD '12; NL stress test 06/10/14 ([facility])   Coronary artery disease of native artery of native heart with stable angina pectoris (HCC)    Degeneration of intervertebral disc of lumbosacral region 08/23/2015   Degenerative disc disease,  lumbar    Degenerative lumbar disc 11/01/2020   Formatting of this note might be different from the original. Was recommended back surgery. Holding out.   Dehiscence of amputation stump of right lower extremity (HCC) 12/20/2022   Dehydration 01/26/2009   Qualifier: Diagnosis of  By: Allyn Ivy RN, Nancy     Depression    Diabetes (HCC) 11/01/2013   Diabetes mellitus (HCC) 04/03/2022   Diabetes mellitus without complication (HCC)    DYSPNEA 01/26/2009   Qualifier: Diagnosis of  By: Stephens Eis     Dysthymic disorder 01/26/2009   Qualifier: Diagnosis of  By: Allyn Ivy RN, Nancy     Elevated alkaline phosphatase level 11/27/2020   Epididymoorchitis 12/31/2020   Essential (primary) hypertension 08/23/2015   Family history of adverse reaction to anesthesia    "it made my son sick"   Gangrene of toe of right foot (HCC) 11/11/2022   Generalized abdominal pain 08/23/2015   GERD (gastroesophageal reflux disease)    History of arthroplasty of right knee    Hyperlipemia    Iron deficiency anemia due to chronic blood loss 11/07/2020   Malaise and fatigue 06/28/2020   Metatarsal deformity 11/01/2013   Mild intermittent asthma without complication    Mixed hyperlipidemia    Moderate episode of recurrent major depressive disorder (HCC)    Morbid obesity (HCC) 08/23/2015  Myocardial infarction (HCC)    2012; hospitalized [facility] for chest pain syndrome 02/2011 and LHC showed trivial CAD   Neuropathy    Noncompliance 05/30/2021   Pain in lower limb 12/07/2013   Plantar fasciitis of left foot 11/01/2013   Pneumonia 08/2016   surgery had to be rescheduled due to pneumonia   Primary insomnia 12/19/2021   Formatting of this note might be different from the original.  Avoid Rx   Primary osteoarthritis involving multiple joints 11/01/2020   Formatting of this note might be different from the original. both knees replaced.   Primary osteoarthritis of left knee 09/06/2016   Primary osteoarthritis  of right knee 09/30/2015   Psoriasis    Sepsis, unspecified organism (HCC) 10/20/2015   Skin ulcer of right great toe (HCC) 01/07/2022   Status post partial colectomy 10/04/2022   Type 2 diabetes mellitus with diabetic polyneuropathy, with long-term current use of insulin  (HCC) 04/03/2022   Type 2 diabetes mellitus with hyperglycemia, with long-term current use of insulin  (HCC) 04/03/2022   Type 2 diabetes mellitus without complication, with long-term current use of insulin  (HCC) 11/01/2013   Type 2 diabetes mellitus without complications (HCC) 08/23/2015   Uncomplicated opioid dependence (HCC)    Vitamin B12 deficiency 11/02/2020    Patient Active Problem List   Diagnosis Date Noted   Neuropathy    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   Dehiscence of amputation stump of right lower extremity (HCC) 12/20/2022   Gangrene of toe of right foot (HCC) 11/11/2022   AKI (acute kidney injury) (HCC) 11/11/2022   Status post partial colectomy 10/04/2022   Type 2 diabetes mellitus with hyperglycemia, with long-term current use of insulin  (HCC) 04/03/2022   Type 2 diabetes mellitus with diabetic polyneuropathy, with long-term current use of insulin  (HCC) 04/03/2022   Diabetes mellitus (HCC) 04/03/2022   Colonic mass 03/04/2022   Skin ulcer of right great toe (HCC) 01/07/2022   Primary insomnia 12/19/2021   Noncompliance 05/30/2021   Back pain of lumbar region with sciatica 01/31/2021   Epididymoorchitis 12/31/2020   Anemia of unknown etiology    Arthritis    Coronary artery disease    Degenerative disc disease, lumbar    Depression    Diabetes mellitus without complication (HCC)    GERD (gastroesophageal reflux disease)    Family history of adverse reaction to anesthesia    Hyperlipemia    Myocardial infarction (HCC)    Elevated alkaline phosphatase level 11/27/2020   Iron deficiency anemia due to chronic blood loss 11/07/2020   Vitamin B12 deficiency  11/02/2020   Anxiety disorder 11/01/2020   Benign prostatic hyperplasia with urinary frequency 11/01/2020   Coronary artery disease of native artery of native heart with stable angina pectoris (HCC) 11/01/2020   Mild intermittent asthma without complication 11/01/2020   Moderate episode of recurrent major depressive disorder (HCC) 11/01/2020   Psoriasis 11/01/2020   Uncomplicated opioid dependence (HCC) 11/01/2020   Primary osteoarthritis involving multiple joints 11/01/2020   Degenerative lumbar disc 11/01/2020   Mixed hyperlipidemia 06/28/2020   Malaise and fatigue 06/28/2020   Pneumonia 08/2016   History of arthroplasty of right knee 10/20/2015   Sepsis, unspecified organism (HCC) 10/20/2015   Primary osteoarthritis of right knee 09/30/2015   Morbid obesity (HCC) 08/23/2015   Essential (primary) hypertension 08/23/2015   Degeneration of intervertebral disc of lumbosacral region 08/23/2015   Generalized abdominal pain 08/23/2015   Class 2 severe obesity due to excess calories with serious comorbidity and  body mass index (BMI) of 38.0 to 38.9 in adult [facility]) 08/23/2015   Type 2 diabetes mellitus without complications (HCC) 08/23/2015   Pain in lower limb 12/07/2013   Plantar fasciitis of left foot 11/01/2013   Metatarsal deformity 11/01/2013   Diabetes (HCC) 11/01/2013   Type 2 diabetes mellitus without complication, with long-term current use of insulin  (HCC) 11/01/2013   DEHYDRATION 01/26/2009   DYSTHYMIC DISORDER 01/26/2009   Atherosclerosis of native artery of extremity with intermittent claudication (HCC) 01/26/2009   CLAUDICATION 01/26/2009   DYSPNEA 01/26/2009   CHEST PAIN UNSPECIFIED 01/26/2009    Past Surgical History:  Procedure Laterality Date   AMPUTATION Right 11/15/2022   Procedure: RIGHT TRANSMETATARSAL AMPUTATION;  Surgeon: Timothy Ford, MD;  Location: [facility] OR;  Service: Orthopedics;  Laterality: Right;   AMPUTATION Right 12/20/2022   Procedure: RIGHT CHOPART  AMPUTATION;  Surgeon: Timothy Ford, MD;  Location: [facility] OR;  Service: Orthopedics;  Laterality: Right;   AMPUTATION Right 01/15/2023   Procedure: RIGHT BELOW KNEE AMPUTATION;  Surgeon: Timothy Ford, MD;  Location: [facility] OR;  Service: Orthopedics;  Laterality: Right;   CARDIAC CATHETERIZATION     03/28/11 LHC: NL LM, LAD; 10% mCX, mRCA. EF 65%. (Dr. Amey Bal, [facility])   CHOLECYSTECTOMY     CORONARY ANGIOPLASTY     2012 [facility]    ESOPHAGEAL DILATION     SEVERAL TIMES   Heel Spur Resection with Fasiotomy Left 11/25/2013   @ [facility]   KNEE ARTHROSCOPY Bilateral    SHOULDER ARTHROSCOPY WITH OPEN ROTATOR CUFF REPAIR Right    TOTAL KNEE ARTHROPLASTY Right 10/04/2015   Procedure: RIGHT TOTAL KNEE ARTHROPLASTY;  Surgeon: Wendolyn Hamburger, MD;  Location: [facility] OR;  Service: Orthopedics;  Laterality: Right;   TOTAL KNEE ARTHROPLASTY Left 10/14/2016   TOTAL KNEE ARTHROPLASTY Left 10/14/2016   Procedure: LEFT TOTAL KNEE ARTHROPLASTY;  Surgeon: Wendolyn Hamburger, MD;  Location: [facility] OR;  Service: Orthopedics;  Laterality: Left;   TUMOR REMOVAL     FATTY TUMOR  RT SHOULDER       Home Medications    Prior to Admission medications   Medication Sig Start Date End Date Taking? Authorizing Provider  buPROPion  (WELLBUTRIN ) 100 MG tablet Take 100 mg by mouth in the morning. 11/06/20  Yes [provider]  fenofibrate (TRICOR) 48 MG tablet Take 48 mg by mouth daily. 07/01/23  Yes [provider]  glipiZIDE  (GLUCOTROL ) 10 MG tablet Take 10 mg by mouth 2 (two) times daily before a meal. 09/12/22  Yes [provider]  metFORMIN  (GLUCOPHAGE ) 500 MG tablet Take 2 tablets (1,000 mg total) by mouth 2 (two) times daily. 04/03/22  Yes Shamleffer, Ibtehal Jaralla, MD  metoprolol  succinate (TOPROL -XL) 25 MG 24 hr tablet Take 25 mg by mouth in the morning. 09/06/15  Yes [provider]  nitrofurantoin, macrocrystal-monohydrate, (MACROBID) 100 MG capsule Take 1 capsule (100 mg total) by mouth 2 (two) times  daily for 7 days. 12/23/23 12/30/23 Yes Guss Legacy, FNP  omeprazole (PRILOSEC) 40 MG capsule Take 40 mg by mouth in the morning.   Yes [provider]  PARoxetine  (PAXIL ) 40 MG tablet Take 40 mg by mouth daily. 08/13/16  Yes [provider]  rosuvastatin  (CRESTOR ) 10 MG tablet Take 10 mg by mouth in the morning. 09/06/15  Yes [provider]  tamsulosin  (FLOMAX ) 0.4 MG CAPS capsule Take 0.4 mg by mouth in the morning and at bedtime. 11/01/20  Yes [provider]  aspirin  325 MG  tablet Take 325 mg by mouth in the morning.    [provider]  Buprenorphine  HCl-Naloxone  HCl 8-2 MG FILM Place 2 each under the tongue daily. 09/11/23   [provider]  busPIRone  (BUSPAR ) 10 MG tablet Take 10 mg by mouth in the morning. 09/23/20   [provider]  Continuous Blood Gluc Sensor (DEXCOM G7 SENSOR) MISC 1 Device by Does not apply route as directed. 04/03/22   Shamleffer, Ibtehal Jaralla, MD  ferrous sulfate  325 (65 FE) MG tablet Take 325 mg by mouth daily with breakfast. 09/22/22   [provider]  gabapentin  (NEURONTIN ) 300 MG capsule TAKE 1 CAPSULE BY MOUTH 4 TIMES A DAY 05/27/23   Charity Conch, DPM  gabapentin  (NEURONTIN ) 400 MG capsule TAKE 1 CAPSULE BY MOUTH 3 TIMES DAILY. 01/28/23   Jennefer Moats, DPM  HUMALOG  KWIKPEN 100 UNIT/ML KwikPen Inject 12 Units into the skin 3 (three) times daily before meals. 09/03/22   [provider]  insulin  glargine, 1 Unit Dial , (TOUJEO  SOLOSTAR) 300 UNIT/ML Solostar Pen Inject 50 Units into the skin daily in the afternoon. 04/03/22   Shamleffer, Ibtehal Jaralla, MD  ivabradine  (CORLANOR) 7.5 MG TABS tablet Take 15 mg (2 tablets) two hours prior to your CT scan. 10/22/23   Revankar, Micael Adas, MD  magnesium  oxide (MAG-OX) 400 MG tablet Take 400 mg by mouth in the morning. 09/22/22   [provider]  Multiple Vitamins-Minerals (CENTRUM SILVER 50+MEN) TABS Take 1 tablet by mouth in the morning.     [provider]  mupirocin  ointment (BACTROBAN ) 2 % Apply 1 Application topically daily. Apply to the affected area 2 times a day 08/10/23   Zamora, Erin R, NP  nitroGLYCERIN  (NITROSTAT ) 0.4 MG SL tablet Place 0.4 mg under the tongue every 5 (five) minutes as needed for chest pain.    [provider]  ranolazine  (RANEXA ) 500 MG 12 hr tablet Take 1 tablet (500 mg total) by mouth 2 (two) times daily. 09/25/23   Revankar, Micael Adas, MD  triamcinolone  cream (KENALOG) 0.1 % Apply 1 application topically 2 (two) times daily as needed for rash. 10/25/20   [provider]  zolpidem (AMBIEN) 5 MG tablet Take 5 mg by mouth at bedtime as needed for sleep. 08/27/23   [provider]    Family History Family History  Problem Relation Age of Onset   Cancer Mother    Diabetes Mother    Hyperlipidemia Father    Hypertension Father    Heart failure Father    Heart failure Brother     Social History Social History   Tobacco Use   Smoking status: Never   Smokeless tobacco: Never  Vaping Use   Vaping status: Never Used  Substance Use Topics   Alcohol use: No   Drug use: No     Allergies   Penicillins and Cymbalta [duloxetine hcl]   Review of Systems Review of Systems  Constitutional:  Negative for chills and fever.  HENT:  Negative for ear pain and sore throat.   Eyes:  Negative for pain and visual disturbance.  Respiratory:  Negative for cough.   Cardiovascular:  Negative for chest pain and palpitations.  Gastrointestinal:  Positive for abdominal pain. Negative for constipation, diarrhea, nausea and vomiting.  Genitourinary:  Positive for dysuria, frequency and urgency. Negative for hematuria.  Musculoskeletal:  Negative for arthralgias and back pain.  Skin:  Negative for color change and rash.  Neurological:  Negative for seizures and syncope.  All other systems reviewed and are negative.    Physical Exam Triage Vital Signs ED Triage Vitals   Encounter Vitals Group     BP 12/23/23 1720 (!) 145/91     Systolic BP Percentile --      Diastolic BP Percentile --      Pulse Rate 12/23/23 1720 (!) 115     Resp 12/23/23 1720 20     Temp 12/23/23 1720 98.4 F (36.9 C)     Temp Source 12/23/23 1720 Oral     SpO2 12/23/23 1720 95 %     Weight --      Height --      Head Circumference --      Peak Flow --      Pain Score 12/23/23 1719 7     Pain Loc --      Pain Education --      Exclude from Growth Chart --    No data found.  Updated Vital Signs BP (!) 145/91 (BP Location: Right Arm)   Pulse (!) 115   Temp 98.4 F (36.9 C) (Oral)   Resp 20   SpO2 95%   Visual Acuity Right Eye Distance:   Left Eye Distance:   Bilateral Distance:    Right Eye Near:   Left Eye Near:    Bilateral Near:     Physical Exam Vitals and nursing note reviewed.  Constitutional:      General: He is not in acute distress.    Appearance: He is well-developed. He is morbidly obese. He is ill-appearing. He is not toxic-appearing.  HENT:     Head: Normocephalic and atraumatic.     Right Ear: Hearing, tympanic membrane, ear canal and external ear normal.     Left Ear: Hearing, tympanic membrane, ear canal and external ear normal.     Nose: No congestion or rhinorrhea.     Right Sinus: No maxillary sinus tenderness or frontal sinus tenderness.     Left Sinus: No maxillary sinus tenderness or frontal sinus tenderness.     Mouth/Throat:     Lips: Pink.     Mouth: Mucous membranes are moist.     Pharynx: Uvula midline. No oropharyngeal exudate or posterior oropharyngeal erythema.     Tonsils: No tonsillar exudate.  Eyes:     Conjunctiva/sclera: Conjunctivae normal.     Pupils: Pupils are equal, round, and reactive to light.  Cardiovascular:     Rate and Rhythm: Normal rate and regular rhythm.     Heart sounds: S1 normal and S2 normal. No murmur heard. Pulmonary:     Effort: Pulmonary effort is normal. No respiratory distress.     Breath  sounds: Normal breath sounds. No decreased breath sounds, wheezing, rhonchi or rales.  Abdominal:     General: Bowel sounds are normal.     Palpations: Abdomen is soft.     Tenderness: There is abdominal tenderness (mild) in the right upper quadrant and left upper quadrant. There is left CVA tenderness. There is no right CVA tenderness, guarding or rebound. Negative signs include Murphy's sign, Rovsing's sign and McBurney's sign.  Musculoskeletal:        General: No swelling.     Cervical back: Neck supple.     Right Lower Extremity: Right leg is amputated above knee.  Lymphadenopathy:     Head:     Right side of head: No submental, submandibular, tonsillar, preauricular or posterior auricular adenopathy.     Left side of  head: No submental, submandibular, tonsillar, preauricular or posterior auricular adenopathy.     Cervical: No cervical adenopathy.     Right cervical: No superficial cervical adenopathy.    Left cervical: No superficial cervical adenopathy.  Skin:    General: Skin is warm and dry.     Capillary Refill: Capillary refill takes less than 2 seconds.     Findings: No rash.  Neurological:     Mental Status: He is alert and oriented to person, place, and time.  Psychiatric:        Mood and Affect: Mood normal.      UC Treatments / Results  Labs (all labs ordered are listed, but only abnormal results are displayed) Labs Reviewed  POCT URINALYSIS DIP (MANUAL ENTRY) - Abnormal; Notable for the following components:      Result Value   Color, UA orange (*)    Glucose, UA >=1,000 (*)    Bilirubin, UA small (*)    Ketones, POC UA small (15) (*)    Protein Ur, POC =100 (*)    Urobilinogen, UA 2.0 (*)    Nitrite, UA Positive (*)    All other components within normal limits  URINE CULTURE    EKG   Radiology No results found.  Procedures Procedures (including critical care time)  Medications Ordered in UC Medications - No data to display  Initial Impression  / Assessment and Plan / UC Course  I have reviewed the triage vital signs and the nursing notes.  Pertinent labs & imaging results that were available during my care of the patient were reviewed by me and considered in my medical decision making (see chart for details).  Plan of Care: Urinary hesitancy: Patient is already on tamsulosin  0.4 mg twice daily.  Based on his symptoms he may have significant BPH that is causing urinary hesitancy.  Encouraged to discuss this with primary care as he may need to see urology.  This could have caused the UTI.  Urinary tract infection: Nitrofurantoin, 100 mg twice daily for 7 days.  Urine culture sent.  Will adjust the plan of care if needed once the culture results.  He is having lower back, flank and abdominal pain.  Abdominal x-ray was negative and did not show stones.  He did not have any significant stool burden either.  If he continues with abdominal pain, take acetaminophen  or ibuprofen as needed for pain.  Follow-up with primary care and may need referral to urology.  I reviewed the plan of care with the patient and/or the patient's guardian.  The patient and/or guardian had time to ask questions and acknowledged that the questions were answered.  I provided instruction on symptoms or reasons to return here or to go to an ER, if symptoms/condition did not improve, worsened or if new symptoms occurred.  Final Clinical Impressions(s) / UC Diagnoses   Final diagnoses:  Urinary tract infection without hematuria, site unspecified  Left upper quadrant abdominal pain  Right upper quadrant abdominal pain  Urinary hesitancy due to benign prostatic hyperplasia  Left flank pain     Discharge Instructions      Urinary hesitancy: Patient is on tamsulosin  0.4 mg twice daily.  If he is having hesitancy and difficulty starting his urine stream, he may have worsening BPH and should see urology.  Advised him to speak to his family doctor about referral to  urology.  Urinalysis shows acute UTI.  Nitrofurantoin 100 mg twice daily for 7 days.  Will adjust  the plan of care, if needed once the culture results.  He is having lower back pain left flank pain and upper abdominal pain: Abdominal x-ray is essentially normal or negative.  No stones seen and he does not have a significant stool burden.  May use over-the-counter acetaminophen  or ibuprofen for pain.  Follow-up with primary care as you probably need referral to urology.  Follow-up here if needed.   ED Prescriptions     Medication Sig Dispense Auth. Provider   nitrofurantoin, macrocrystal-monohydrate, (MACROBID) 100 MG capsule Take 1 capsule (100 mg total) by mouth 2 (two) times daily for 7 days. 14 capsule Guss Legacy, FNP      PDMP not reviewed this encounter.   Guss Legacy, FNP 12/23/23 [phone]

## 2023-12-24 LAB — URINE CULTURE: Culture: <10000 — AB

## 2023-12-24 NOTE — Telephone Encounter (Signed)
 I called pt and offered appt today and he declined due to transportation offered tomorrow and he agreed to 9 am

## 2023-12-25 ENCOUNTER — Ambulatory Visit (INDEPENDENT_AMBULATORY_CARE_PROVIDER_SITE_OTHER): Admitting: Orthopedic Surgery

## 2023-12-25 DIAGNOSIS — B351 Tinea unguium: Secondary | ICD-10-CM

## 2023-12-25 DIAGNOSIS — Z89511 Acquired absence of right leg below knee: Secondary | ICD-10-CM | POA: Diagnosis not present

## 2023-12-25 DIAGNOSIS — L97521 Non-pressure chronic ulcer of other part of left foot limited to breakdown of skin: Secondary | ICD-10-CM | POA: Diagnosis not present

## 2023-12-25 DIAGNOSIS — S88111S Complete traumatic amputation at level between knee and ankle, right lower leg, sequela: Secondary | ICD-10-CM

## 2023-12-25 MED ORDER — GABAPENTIN 300 MG PO CAPS: 300.0000 mg | ORAL_CAPSULE | Freq: Two times a day (BID) | ORAL | 3 refills | Status: AC

## 2023-12-25 MED ORDER — MUPIROCIN 2 % EX OINT: 1.0000 | TOPICAL_OINTMENT | Freq: Two times a day (BID) | CUTANEOUS | 3 refills | Status: AC

## 2024-01-05 ENCOUNTER — Encounter: Payer: Self-pay | Admitting: Orthopedic Surgery

## 2024-01-05 NOTE — Progress Notes (Signed)
 Office Visit Note   Patient: Kyle [name]           Date of Birth: Oct 02, 1960           MRN: [id number] Visit Date: 12/25/2023              Requested by: Kyle [name]., MD [address] [place],  [place] [address] PCP: Kyle [name]., MD  Chief Complaint  Patient presents with   Left Foot - Wound Check      HPI: Patient is a 63 year old gentleman who was seen for blisters to the left foot for a week.  Patient's thinks that this is secondary to wearing tight socks.  Patient also complains of painful onychomycotic nails.  Assessment & Plan: Visit Diagnoses:  1. Ulcer of toe of left foot, limited to breakdown of skin (HCC)   2. Below-knee amputation of right lower extremity, sequela (HCC)     Plan: Nails were trimmed x 5.  Patient was provided a prescription for Neurontin  and a prescription for Bactroban  and a postoperative shoe with daily Bactroban  dressing changes.  Follow-Up Instructions: Return in about 4 weeks (around 01/22/2024).   Ortho Exam  Patient is alert, oriented, no adenopathy, well-dressed, normal affect, normal respiratory effort. Examination patient has a good dorsalis pedis pulse.  His rate is a regular he states he does see a cardiologist.  Patient has blisters on the dorsal aspect of all toes on the left foot which appears secondary to tight shoe wear.  There is no exposed bone or tendon no ascending cellulitis.  No sausage digit swelling. Patient has thickened discolored onychomycotic nails x 5 he is unable to safely trim the nails on his own and the nails were trimmed x 5 without complication.   Imaging: No results found. No images are attached to the encounter.  Labs: Lab Results  Component Value Date   HGBA1C 7.0 (H) 11/12/2022   HGBA1C 9.2 (H) 10/14/2016   HGBA1C 8.8 (H) 09/03/2016   ESRSEDRATE 79 (H) 11/11/2022   CRP 18.4 (H) 11/11/2022   REPTSTATUS 12/24/2023 FINAL 12/23/2023   CULT (A) 12/23/2023    <10,000 COLONIES/mL INSIGNIFICANT  GROWTH Performed at [facility], [address] [place], [place] [address]      Lab Results  Component Value Date   ALBUMIN 4.4 11/26/2023   ALBUMIN 3.4 (L) 11/11/2022    Lab Results  Component Value Date   MG 1.8 11/15/2022   MG 1.6 (L) 11/14/2022   No results found for: "VD25OH"  No results found for: "PREALBUMIN"    Latest Ref Rng & Units 01/15/2023   10:02 AM 11/13/2022    1:06 AM 11/12/2022    2:53 AM  CBC EXTENDED  WBC 4.0 - 10.5 K/uL 10.0  8.4  11.1   RBC 4.22 - 5.81 MIL/uL 3.84  3.72  3.39   Hemoglobin 13.0 - 17.0 g/dL 82.9  56.2  9.2   HCT 13.0 - 52.0 % 33.5  32.1  29.3   Platelets 150 - 400 K/uL 462  320  294   NEUT# 1.7 - 7.7 K/uL  4.7    Lymph# 0.7 - 4.0 K/uL  2.4       There is no height or weight on file to calculate BMI.  Orders:  No orders of the defined types were placed in this encounter.  Meds ordered this encounter  Medications   mupirocin  ointment (BACTROBAN ) 2 %    Sig: Apply 1 Application topically  2 (two) times daily. Apply to the affected area 2 times a day    Dispense:  22 g    Refill:  3   gabapentin  (NEURONTIN ) 300 MG capsule    Sig: Take 1 capsule (300 mg total) by mouth 2 (two) times daily. 3 times a day when necessary neuropathy pain    Dispense:  180 capsule    Refill:  3     Procedures: No procedures performed  Clinical Data: No additional findings.  ROS:  All other systems negative, except as noted in the HPI. Review of Systems  Objective: Vital Signs: There were no vitals taken for this visit.  Specialty Comments:  No specialty comments available.  PMFS History: Patient Active Problem List   Diagnosis Date Noted   Neuropathy    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   Dehiscence of amputation stump of right lower extremity (HCC) 12/20/2022   Gangrene of toe of right foot (HCC) 11/11/2022   AKI (acute kidney injury) (HCC) 11/11/2022   Status post partial colectomy 10/04/2022    Type 2 diabetes mellitus with hyperglycemia, with long-term current use of insulin  (HCC) 04/03/2022   Type 2 diabetes mellitus with diabetic polyneuropathy, with long-term current use of insulin  (HCC) 04/03/2022   Diabetes mellitus (HCC) 04/03/2022   Colonic mass 03/04/2022   Skin ulcer of right great toe (HCC) 01/07/2022   Primary insomnia 12/19/2021   Noncompliance 05/30/2021   Back pain of lumbar region with sciatica 01/31/2021   Epididymoorchitis 12/31/2020   Anemia of unknown etiology    Arthritis    Coronary artery disease    Degenerative disc disease, lumbar    Depression    Diabetes mellitus without complication (HCC)    GERD (gastroesophageal reflux disease)    Family history of adverse reaction to anesthesia    Hyperlipemia    Myocardial infarction (HCC)    Elevated alkaline phosphatase level 11/27/2020   Iron deficiency anemia due to chronic blood loss 11/07/2020   Vitamin B12 deficiency 11/02/2020   Anxiety disorder 11/01/2020   Benign prostatic hyperplasia with urinary frequency 11/01/2020   Coronary artery disease of native artery of native heart with stable angina pectoris (HCC) 11/01/2020   Mild intermittent asthma without complication 11/01/2020   Moderate episode of recurrent major depressive disorder (HCC) 11/01/2020   Psoriasis 11/01/2020   Uncomplicated opioid dependence (HCC) 11/01/2020   Primary osteoarthritis involving multiple joints 11/01/2020   Degenerative lumbar disc 11/01/2020   Mixed hyperlipidemia 06/28/2020   Malaise and fatigue 06/28/2020   Pneumonia 08/2016   History of arthroplasty of right knee 10/20/2015   Sepsis, unspecified organism (HCC) 10/20/2015   Primary osteoarthritis of right knee 09/30/2015   Morbid obesity (HCC) 08/23/2015   Essential (primary) hypertension 08/23/2015   Degeneration of intervertebral disc of lumbosacral region 08/23/2015   Generalized abdominal pain 08/23/2015   Class 2 severe obesity due to excess calories  with serious comorbidity and body mass index (BMI) of 38.0 to 38.9 in adult [facility]) 08/23/2015   Type 2 diabetes mellitus without complications (HCC) 08/23/2015   Pain in lower limb 12/07/2013   Plantar fasciitis of left foot 11/01/2013   Metatarsal deformity 11/01/2013   Diabetes (HCC) 11/01/2013   Type 2 diabetes mellitus without complication, with long-term current use of insulin  (HCC) 11/01/2013   DEHYDRATION 01/26/2009   DYSTHYMIC DISORDER 01/26/2009   Atherosclerosis of native artery of extremity with intermittent claudication (HCC) 01/26/2009   CLAUDICATION 01/26/2009   DYSPNEA 01/26/2009  CHEST PAIN UNSPECIFIED 01/26/2009   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   Benign prostatic hyperplasia with urinary frequency    CHEST PAIN UNSPECIFIED 01/26/2009   Qualifier: Diagnosis of  By: Allyn Ivy RN, Haskell Linker     Class 2 severe obesity due to excess calories with serious comorbidity and body mass index (BMI) of 38.0 to 38.9 in adult [facility])    CLAUDICATION 01/26/2009   Qualifier: Diagnosis of  By: Allyn Ivy RN, Nancy     Colonic mass 03/04/2022   Coronary artery disease    minimal CAD '12; NL stress test 06/10/14 ([facility])   Coronary artery disease of native artery of native heart with stable angina pectoris (HCC)    Degeneration of intervertebral disc of lumbosacral region 08/23/2015   Degenerative disc disease, lumbar    Degenerative lumbar  disc 11/01/2020   Formatting of this note might be different from the original. Was recommended back surgery. Holding out.   Dehiscence of amputation stump of right lower extremity (HCC) 12/20/2022   Dehydration 01/26/2009   Qualifier: Diagnosis of  By: Allyn Ivy RN, Nancy     Depression    Diabetes (HCC) 11/01/2013   Diabetes mellitus (HCC) 04/03/2022   Diabetes mellitus without complication (HCC)    DYSPNEA 01/26/2009   Qualifier: Diagnosis of  By: Allyn Ivy RN, Haskell Linker     Dysthymic disorder 01/26/2009   Qualifier: Diagnosis of  By: Allyn Ivy RN, Nancy     Elevated alkaline phosphatase level 11/27/2020   Epididymoorchitis 12/31/2020   Essential (primary) hypertension 08/23/2015   Family history of adverse reaction to anesthesia    "it made my son sick"   Gangrene of toe of right foot (HCC) 11/11/2022   Generalized abdominal pain 08/23/2015   GERD (gastroesophageal reflux disease)    History of arthroplasty of right knee    Hyperlipemia    Iron deficiency anemia due to chronic blood loss 11/07/2020   Malaise and fatigue 06/28/2020   Metatarsal deformity 11/01/2013   Mild intermittent asthma without complication    Mixed hyperlipidemia    Moderate episode of recurrent major depressive disorder (HCC)    Morbid obesity (HCC) 08/23/2015   Myocardial infarction (HCC)    2012; hospitalized [facility] for chest pain syndrome 02/2011 and LHC showed trivial CAD   Neuropathy    Noncompliance 05/30/2021   Pain in lower limb 12/07/2013   Plantar fasciitis of left foot 11/01/2013   Pneumonia 08/2016   surgery had to be rescheduled due to pneumonia   Primary insomnia 12/19/2021   Formatting of this note might be different from the original.  Avoid Rx   Primary osteoarthritis involving multiple joints 11/01/2020   Formatting of this note might be different from the original. both knees replaced.   Primary osteoarthritis of left knee 09/06/2016   Primary osteoarthritis of right knee 09/30/2015    Psoriasis    Sepsis,  unspecified organism (HCC) 10/20/2015   Skin ulcer of right great toe (HCC) 01/07/2022   Status post partial colectomy 10/04/2022   Type 2 diabetes mellitus with diabetic polyneuropathy, with long-term current use of insulin  (HCC) 04/03/2022   Type 2 diabetes mellitus with hyperglycemia, with long-term current use of insulin  (HCC) 04/03/2022   Type 2 diabetes mellitus without complication, with long-term current use of insulin  (HCC) 11/01/2013   Type 2 diabetes mellitus without complications (HCC) 08/23/2015   Uncomplicated opioid dependence (HCC)    Vitamin B12 deficiency 11/02/2020    Family History  Problem Relation Age of Onset   Cancer Mother    Diabetes Mother    Hyperlipidemia Father    Hypertension Father    Heart failure Father    Heart failure Brother     Past Surgical History:  Procedure Laterality Date   AMPUTATION Right 11/15/2022   Procedure: RIGHT TRANSMETATARSAL AMPUTATION;  Surgeon: Timothy Ford, MD;  Location: [facility] OR;  Service: Orthopedics;  Laterality: Right;   AMPUTATION Right 12/20/2022   Procedure: RIGHT CHOPART AMPUTATION;  Surgeon: Timothy Ford, MD;  Location[facility] OR;  Service: Orthopedics;  Laterality: Right;   AMPUTATION Right 01/15/2023   Procedure: RIGHT BELOW KNEE AMPUTATION;  Surgeon: Timothy Ford, MD;  Location: [facility] OR;  Service: Orthopedics;  Laterality: Right;   CARDIAC CATHETERIZATION     03/28/11 LHC: NL LM, LAD; 10% mCX, mRCA. EF 65%. (Dr. Amey Bal, [facility])   CHOLECYSTECTOMY     CORONARY ANGIOPLASTY     [facility]    ESOPHAGEAL DILATION     SEVERAL TIMES   Heel Spur Resection with Fasiotomy Left 11/25/2013   @ [facility]   KNEE ARTHROSCOPY Bilateral    SHOULDER ARTHROSCOPY WITH OPEN ROTATOR CUFF REPAIR Right    TOTAL KNEE ARTHROPLASTY Right 10/04/2015   Procedure: RIGHT TOTAL KNEE ARTHROPLASTY;  Surgeon: Wendolyn Hamburger, MD;  Location: [facility] OR;  Service: Orthopedics;  Laterality: Right;   TOTAL KNEE ARTHROPLASTY Left 10/14/2016    TOTAL KNEE ARTHROPLASTY Left 10/14/2016   Procedure: LEFT TOTAL KNEE ARTHROPLASTY;  Surgeon: Wendolyn Hamburger, MD;  Location: [facility] OR;  Service: Orthopedics;  Laterality: Left;   TUMOR REMOVAL     FATTY TUMOR  RT SHOULDER   Social History   Occupational History   Not on file  Tobacco Use   Smoking status: Never   Smokeless tobacco: Never  Vaping Use   Vaping status: Never Used  Substance and Sexual Activity   Alcohol use: No   Drug use: No   Sexual activity: Not on file

## 2024-01-22 ENCOUNTER — Ambulatory Visit: Admitting: Orthopedic Surgery

## 2024-03-22 ENCOUNTER — Ambulatory Visit (INDEPENDENT_AMBULATORY_CARE_PROVIDER_SITE_OTHER): Admitting: Orthopedic Surgery

## 2024-03-22 ENCOUNTER — Encounter: Payer: Self-pay | Admitting: Orthopedic Surgery

## 2024-03-22 DIAGNOSIS — B351 Tinea unguium: Secondary | ICD-10-CM

## 2024-03-22 DIAGNOSIS — Z89511 Acquired absence of right leg below knee: Secondary | ICD-10-CM | POA: Diagnosis not present

## 2024-03-22 DIAGNOSIS — S88111S Complete traumatic amputation at level between knee and ankle, right lower leg, sequela: Secondary | ICD-10-CM

## 2024-03-22 MED ORDER — MELOXICAM 15 MG PO TABS: 15.0000 mg | ORAL_TABLET | Freq: Every day | ORAL | 3 refills | Status: AC

## 2024-03-22 NOTE — Progress Notes (Signed)
 Office Visit Note   Patient: Kyle [name]           Date of Birth: July 10, 1961           MRN: [id number] Visit Date: 03/22/2024              Requested by: Thurmond Cathlyn LABOR., MD [address] [place],  [place] [address] PCP: Thurmond Cathlyn LABOR., MD  Chief Complaint  Patient presents with   Left Foot - Follow-up      HPI: Patient is a 63 year old gentleman who is seen for 2 separate issues.  #1 thickened discolored onychomycotic nails on the left foot he is unable to trim on his own.  #2 patient states he has phantom pain in the right lower extremity he has tried Neurontin  without relief.  Patient states he has had relief with using meloxicam .  Assessment & Plan: Visit Diagnoses:  1. Below-knee amputation of right lower extremity, sequela (HCC)   2. Onychomycosis     Plan: Prescription was called in for meloxicam  15 mg tablets.  Follow-up in 3 months to reevaluate the left toenails.  Follow-Up Instructions: Return in about 3 months (around 06/22/2024).   Ortho Exam  Patient is alert, oriented, no adenopathy, well-dressed, normal affect, normal respiratory effort. Examination patient has thickened discolored onychomycotic nails on the left foot x 5.  Patient does not have any diabetic insensate ulcers.  After informed consent  nails were trimmed x 5 without complications.  Examination of the right leg there are no ulcers on the residual limb.  Patient has a well-fitting prosthesis.     Imaging: No results found. No images are attached to the encounter.  Labs: Lab Results  Component Value Date   HGBA1C 7.0 (H) 11/12/2022   HGBA1C 9.2 (H) 10/14/2016   HGBA1C 8.8 (H) 09/03/2016   ESRSEDRATE 79 (H) 11/11/2022   CRP 18.4 (H) 11/11/2022   REPTSTATUS 12/24/2023 FINAL 12/23/2023   CULT (A) 12/23/2023    <10,000 COLONIES/mL INSIGNIFICANT GROWTH Performed at [facility], [address] [place], [place] [address]      Lab Results  Component Value Date   ALBUMIN 4.4  11/26/2023   ALBUMIN 3.4 (L) 11/11/2022    Lab Results  Component Value Date   MG 1.8 11/15/2022   MG 1.6 (L) 11/14/2022   No results found for: VD25OH  No results found for: PREALBUMIN    Latest Ref Rng & Units 01/15/2023   10:02 AM 11/13/2022    1:06 AM 11/12/2022    2:53 AM  CBC EXTENDED  WBC 4.0 - 10.5 K/uL 10.0  8.4  11.1   RBC 4.22 - 5.81 MIL/uL 3.84  3.72  3.39   Hemoglobin 13.0 - 17.0 g/dL 89.9  89.7  9.2   HCT 60.9 - 52.0 % 33.5  32.1  29.3   Platelets 150 - 400 K/uL 462  320  294   NEUT# 1.7 - 7.7 K/uL  4.7    Lymph# 0.7 - 4.0 K/uL  2.4       There is no height or weight on file to calculate BMI.  Orders:  No orders of the defined types were placed in this encounter.  Meds ordered this encounter  Medications   meloxicam  (MOBIC ) 15 MG tablet    Sig: Take 1 tablet (15 mg total) by mouth daily.    Dispense:  90 tablet    Refill:  3     Procedures: No procedures performed  Clinical  Data: No additional findings.  ROS:  All other systems negative, except as noted in the HPI. Review of Systems  Objective: Vital Signs: There were no vitals taken for this visit.  Specialty Comments:  No specialty comments available.  PMFS History: Patient Active Problem List   Diagnosis Date Noted   Neuropathy    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   Dehiscence of amputation stump of right lower extremity (HCC) 12/20/2022   Gangrene of toe of right foot (HCC) 11/11/2022   AKI (acute kidney injury) (HCC) 11/11/2022   Status post partial colectomy 10/04/2022   Type 2 diabetes mellitus with hyperglycemia, with long-term current use of insulin  (HCC) 04/03/2022   Type 2 diabetes mellitus with diabetic polyneuropathy, with long-term current use of insulin  (HCC) 04/03/2022   Diabetes mellitus (HCC) 04/03/2022   Colonic mass 03/04/2022   Skin ulcer of right great toe (HCC) 01/07/2022   Primary insomnia 12/19/2021   Noncompliance 05/30/2021    Back pain of lumbar region with sciatica 01/31/2021   Epididymoorchitis 12/31/2020   Anemia of unknown etiology    Arthritis    Coronary artery disease    Degenerative disc disease, lumbar    Depression    Diabetes mellitus without complication (HCC)    GERD (gastroesophageal reflux disease)    Family history of adverse reaction to anesthesia    Hyperlipemia    Myocardial infarction (HCC)    Elevated alkaline phosphatase level 11/27/2020   Iron deficiency anemia due to chronic blood loss 11/07/2020   Vitamin B12 deficiency 11/02/2020   Anxiety disorder 11/01/2020   Benign prostatic hyperplasia with urinary frequency 11/01/2020   Coronary artery disease of native artery of native heart with stable angina pectoris (HCC) 11/01/2020   Mild intermittent asthma without complication 11/01/2020   Moderate episode of recurrent major depressive disorder (HCC) 11/01/2020   Psoriasis 11/01/2020   Uncomplicated opioid dependence (HCC) 11/01/2020   Primary osteoarthritis involving multiple joints 11/01/2020   Degenerative lumbar disc 11/01/2020   Mixed hyperlipidemia 06/28/2020   Malaise and fatigue 06/28/2020   Pneumonia 08/2016   History of arthroplasty of right knee 10/20/2015   Sepsis, unspecified organism (HCC) 10/20/2015   Primary osteoarthritis of right knee 09/30/2015   Morbid obesity (HCC) 08/23/2015   Essential (primary) hypertension 08/23/2015   Degeneration of intervertebral disc of lumbosacral region 08/23/2015   Generalized abdominal pain 08/23/2015   Class 2 severe obesity due to excess calories with serious comorbidity and body mass index (BMI) of 38.0 to 38.9 in adult [facility]) 08/23/2015   Type 2 diabetes mellitus without complications (HCC) 08/23/2015   Pain in lower limb 12/07/2013   Plantar fasciitis of left foot 11/01/2013   Metatarsal deformity 11/01/2013   Diabetes (HCC) 11/01/2013   Type 2 diabetes mellitus without complication, with long-term current use of insulin   (HCC) 11/01/2013   DEHYDRATION 01/26/2009   DYSTHYMIC DISORDER 01/26/2009   Atherosclerosis of native artery of extremity with intermittent claudication (HCC) 01/26/2009   CLAUDICATION 01/26/2009   DYSPNEA 01/26/2009   CHEST PAIN UNSPECIFIED 01/26/2009   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 (HCC) 01/26/2009   Qualifier: Diagnosis of   By: Derek OBIE Inocente KATRINA SNOMED Dx Update Oct 2024  ATHEROSLERO NATV ART EXTREM W/INTERMIT CLAUDICAT 01/26/2009   Qualifier: Diagnosis of  By: Derek RN, Nancy     Back pain of lumbar region with sciatica 01/31/2021   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   Benign prostatic hyperplasia with urinary frequency    CHEST PAIN UNSPECIFIED 01/26/2009   Qualifier: Diagnosis of  By: Derek RN, Inocente Muff 2 severe obesity due to excess calories with serious comorbidity and body mass index (BMI) of 38.0 to 38.9 in adult [facility])    CLAUDICATION 01/26/2009   Qualifier: Diagnosis of  By: Derek RN, Nancy     Colonic mass 03/04/2022   Coronary artery disease    minimal CAD '12; NL stress test 06/10/14 ([facility])   Coronary artery disease of native artery of native heart with stable angina pectoris (HCC)    Degeneration of intervertebral disc of lumbosacral region 08/23/2015   Degenerative disc disease, lumbar    Degenerative lumbar disc 11/01/2020   Formatting of this note might be different from the original. Was recommended back surgery. Holding out.   Dehiscence of amputation stump of right lower extremity (HCC) 12/20/2022   Dehydration 01/26/2009   Qualifier: Diagnosis of  By: Derek RN, Nancy     Depression    Diabetes (HCC) 11/01/2013   Diabetes mellitus (HCC) 04/03/2022   Diabetes mellitus without  complication (HCC)    DYSPNEA 01/26/2009   Qualifier: Diagnosis of  By: Derek RN, Inocente     Dysthymic disorder 01/26/2009   Qualifier: Diagnosis of  By: Derek RN, Nancy     Elevated alkaline phosphatase level 11/27/2020   Epididymoorchitis 12/31/2020   Essential (primary) hypertension 08/23/2015   Family history of adverse reaction to anesthesia    it made my son sick   Gangrene of toe of right foot (HCC) 11/11/2022   Generalized abdominal pain 08/23/2015   GERD (gastroesophageal reflux disease)    History of arthroplasty of right knee    Hyperlipemia    Iron deficiency anemia due to chronic blood loss 11/07/2020   Malaise and fatigue 06/28/2020   Metatarsal deformity 11/01/2013   Mild intermittent asthma without complication    Mixed hyperlipidemia    Moderate episode of recurrent major depressive disorder (HCC)    Morbid obesity (HCC) 08/23/2015   Myocardial infarction (HCC)    2012; hospitalized [facility] for chest pain syndrome 02/2011 and LHC showed trivial CAD   Neuropathy    Noncompliance 05/30/2021   Pain in lower limb 12/07/2013   Plantar fasciitis of left foot 11/01/2013   Pneumonia 08/2016   surgery had to be rescheduled due to pneumonia   Primary insomnia 12/19/2021   Formatting of this note might be different from the original.  Avoid Rx   Primary osteoarthritis involving multiple joints 11/01/2020   Formatting of this note might be different from the original. both knees replaced.   Primary osteoarthritis of left knee 09/06/2016   Primary osteoarthritis of right knee 09/30/2015   Psoriasis    Sepsis, unspecified organism (HCC) 10/20/2015   Skin ulcer of right great toe (HCC) 01/07/2022   Status post partial colectomy 10/04/2022   Type 2 diabetes mellitus with diabetic polyneuropathy, with long-term current use of insulin  (HCC) 04/03/2022   Type 2 diabetes mellitus with hyperglycemia, with long-term current use of insulin  (HCC) 04/03/2022   Type 2 diabetes  mellitus without complication, with long-term current use of insulin  (HCC) 11/01/2013   Type 2 diabetes mellitus without complications (HCC) 08/23/2015  Uncomplicated opioid dependence (HCC)    Vitamin B12 deficiency 11/02/2020    Family History  Problem Relation Age of Onset   Cancer Mother    Diabetes Mother    Hyperlipidemia Father    Hypertension Father    Heart failure Father    Heart failure Brother     Past Surgical History:  Procedure Laterality Date   AMPUTATION Right 11/15/2022   Procedure: RIGHT TRANSMETATARSAL AMPUTATION;  Surgeon: Harden Jerona GAILS, MD;  Location: [facility] OR;  Service: Orthopedics;  Laterality: Right;   AMPUTATION Right 12/20/2022   Procedure: RIGHT CHOPART AMPUTATION;  Surgeon: Harden Jerona GAILS, MD;  Location: [facility] OR;  Service: Orthopedics;  Laterality: Right;   AMPUTATION Right 01/15/2023   Procedure: RIGHT BELOW KNEE AMPUTATION;  Surgeon: Harden Jerona GAILS, MD;  Location: [facility] OR;  Service: Orthopedics;  Laterality: Right;   CARDIAC CATHETERIZATION     03/28/11 LHC: NL LM, LAD; 10% mCX, mRCA. EF 65%. (Dr. Maralee, [facility])   CHOLECYSTECTOMY     CORONARY ANGIOPLASTY     2012 [facility]    ESOPHAGEAL DILATION     SEVERAL TIMES   Heel Spur Resection with Fasiotomy Left 11/25/2013   @ [facility]   KNEE ARTHROSCOPY Bilateral    SHOULDER ARTHROSCOPY WITH OPEN ROTATOR CUFF REPAIR Right    TOTAL KNEE ARTHROPLASTY Right 10/04/2015   Procedure: RIGHT TOTAL KNEE ARTHROPLASTY;  Surgeon: Dempsey Sensor, MD;  Location: [facility] OR;  Service: Orthopedics;  Laterality: Right;   TOTAL KNEE ARTHROPLASTY Left 10/14/2016   TOTAL KNEE ARTHROPLASTY Left 10/14/2016   Procedure: LEFT TOTAL KNEE ARTHROPLASTY;  Surgeon: Dempsey Sensor, MD;  Location: [facility] OR;  Service: Orthopedics;  Laterality: Left;   TUMOR REMOVAL     FATTY TUMOR  RT SHOULDER   Social History   Occupational History   Not on file  Tobacco Use   Smoking status: Never   Smokeless tobacco: Never  Vaping Use   Vaping status: Never Used   Substance and Sexual Activity   Alcohol use: No   Drug use: No   Sexual activity: Not on file

## 2024-05-28 ENCOUNTER — Telehealth: Payer: Self-pay | Admitting: Orthopedic Surgery

## 2024-05-28 NOTE — Telephone Encounter (Signed)
 CALLED PT LEFT VM PROVIDER OUT OF OFFICE NEED TO R/S/ with [name]

## 2024-05-31 ENCOUNTER — Encounter: Payer: Self-pay | Admitting: Radiology

## 2024-06-21 ENCOUNTER — Ambulatory Visit: Admitting: Orthopedic Surgery

## 2024-07-01 ENCOUNTER — Ambulatory Visit: Admitting: Cardiology

## 2024-07-19 ENCOUNTER — Ambulatory Visit: Admitting: Orthopedic Surgery

## 2024-08-02 ENCOUNTER — Ambulatory Visit: Admitting: Physician Assistant

## 2024-08-06 ENCOUNTER — Ambulatory Visit (INDEPENDENT_AMBULATORY_CARE_PROVIDER_SITE_OTHER): Admitting: Physician Assistant

## 2024-08-06 ENCOUNTER — Encounter: Payer: Self-pay | Admitting: Physician Assistant

## 2024-08-06 DIAGNOSIS — S81802A Unspecified open wound, left lower leg, initial encounter: Secondary | ICD-10-CM | POA: Diagnosis not present

## 2024-08-06 DIAGNOSIS — B351 Tinea unguium: Secondary | ICD-10-CM

## 2024-08-06 DIAGNOSIS — Z89511 Acquired absence of right leg below knee: Secondary | ICD-10-CM
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
## 2024-09-02 ENCOUNTER — Ambulatory Visit: Admitting: Physician Assistant
